# Patient Record
Sex: Female | Born: 1965 | Race: White | Hispanic: No | Marital: Married | Smoking: Never smoker
Health system: Southern US, Community
[De-identification: ages and names within clinical notes are randomized; demographics above are authoritative.]

## PROBLEM LIST (undated history)

## (undated) DIAGNOSIS — E785 Hyperlipidemia, unspecified: Secondary | ICD-10-CM

## (undated) DIAGNOSIS — I272 Pulmonary hypertension, unspecified: Secondary | ICD-10-CM

## (undated) DIAGNOSIS — M1990 Unspecified osteoarthritis, unspecified site: Secondary | ICD-10-CM

## (undated) DIAGNOSIS — I10 Essential (primary) hypertension: Secondary | ICD-10-CM

## (undated) DIAGNOSIS — C801 Malignant (primary) neoplasm, unspecified: Secondary | ICD-10-CM

## (undated) DIAGNOSIS — G709 Myoneural disorder, unspecified: Secondary | ICD-10-CM

## (undated) DIAGNOSIS — D649 Anemia, unspecified: Secondary | ICD-10-CM

## (undated) DIAGNOSIS — E611 Iron deficiency: Secondary | ICD-10-CM

## (undated) DIAGNOSIS — I493 Ventricular premature depolarization: Secondary | ICD-10-CM

## (undated) DIAGNOSIS — C50919 Malignant neoplasm of unspecified site of unspecified female breast: Secondary | ICD-10-CM

## (undated) DIAGNOSIS — E079 Disorder of thyroid, unspecified: Secondary | ICD-10-CM

## (undated) DIAGNOSIS — M8580 Other specified disorders of bone density and structure, unspecified site: Secondary | ICD-10-CM

## (undated) HISTORY — PX: OTHER SURGICAL HISTORY: SHX169

## (undated) HISTORY — PX: MASTECTOMY: SHX3

## (undated) HISTORY — DX: Malignant neoplasm of unspecified site of unspecified female breast: C50.919

## (undated) HISTORY — DX: Hyperlipidemia, unspecified: E78.5

## (undated) HISTORY — DX: Iron deficiency: E61.1

## (undated) HISTORY — DX: Anemia, unspecified: D64.9

## (undated) HISTORY — DX: Ventricular premature depolarization: I49.3

## (undated) HISTORY — DX: Unspecified osteoarthritis, unspecified site: M19.90

## (undated) HISTORY — DX: Essential (primary) hypertension: I10

## (undated) HISTORY — DX: Pulmonary hypertension, unspecified: I27.20

## (undated) HISTORY — DX: Disorder of thyroid, unspecified: E07.9

## (undated) HISTORY — DX: Myoneural disorder, unspecified: G70.9

## (undated) HISTORY — DX: Other specified disorders of bone density and structure, unspecified site: M85.80

## (undated) HISTORY — DX: Malignant (primary) neoplasm, unspecified: C80.1

## (undated) HISTORY — PX: ABDOMINAL HYSTERECTOMY: SHX81

---

## 2012-05-05 DIAGNOSIS — E063 Autoimmune thyroiditis: Secondary | ICD-10-CM | POA: Insufficient documentation

## 2012-05-05 DIAGNOSIS — R1310 Dysphagia, unspecified: Secondary | ICD-10-CM | POA: Insufficient documentation

## 2012-05-05 DIAGNOSIS — I10 Essential (primary) hypertension: Secondary | ICD-10-CM | POA: Insufficient documentation

## 2012-05-05 DIAGNOSIS — E039 Hypothyroidism, unspecified: Secondary | ICD-10-CM | POA: Insufficient documentation

## 2012-06-16 DIAGNOSIS — D219 Benign neoplasm of connective and other soft tissue, unspecified: Secondary | ICD-10-CM | POA: Insufficient documentation

## 2012-07-29 DIAGNOSIS — K2100 Gastro-esophageal reflux disease with esophagitis, without bleeding: Secondary | ICD-10-CM | POA: Insufficient documentation

## 2012-11-22 DIAGNOSIS — T50B95A Adverse effect of other viral vaccines, initial encounter: Secondary | ICD-10-CM | POA: Insufficient documentation

## 2013-10-26 DIAGNOSIS — M797 Fibromyalgia: Secondary | ICD-10-CM | POA: Insufficient documentation

## 2014-02-17 DIAGNOSIS — M545 Low back pain, unspecified: Secondary | ICD-10-CM | POA: Insufficient documentation

## 2014-02-17 DIAGNOSIS — G8929 Other chronic pain: Secondary | ICD-10-CM | POA: Insufficient documentation

## 2014-07-12 DIAGNOSIS — L659 Nonscarring hair loss, unspecified: Secondary | ICD-10-CM | POA: Insufficient documentation

## 2014-12-13 DIAGNOSIS — C50919 Malignant neoplasm of unspecified site of unspecified female breast: Secondary | ICD-10-CM | POA: Insufficient documentation

## 2014-12-13 DIAGNOSIS — Z853 Personal history of malignant neoplasm of breast: Secondary | ICD-10-CM | POA: Insufficient documentation

## 2017-08-07 DIAGNOSIS — R768 Other specified abnormal immunological findings in serum: Secondary | ICD-10-CM | POA: Insufficient documentation

## 2018-02-08 DIAGNOSIS — E785 Hyperlipidemia, unspecified: Secondary | ICD-10-CM | POA: Insufficient documentation

## 2020-10-15 DIAGNOSIS — I10 Essential (primary) hypertension: Secondary | ICD-10-CM | POA: Diagnosis not present

## 2020-10-15 DIAGNOSIS — E039 Hypothyroidism, unspecified: Secondary | ICD-10-CM | POA: Diagnosis not present

## 2020-11-01 DIAGNOSIS — Z20822 Contact with and (suspected) exposure to covid-19: Secondary | ICD-10-CM | POA: Diagnosis not present

## 2020-11-01 DIAGNOSIS — J029 Acute pharyngitis, unspecified: Secondary | ICD-10-CM | POA: Diagnosis not present

## 2020-11-01 DIAGNOSIS — R0981 Nasal congestion: Secondary | ICD-10-CM | POA: Diagnosis not present

## 2020-11-06 DIAGNOSIS — Z20822 Contact with and (suspected) exposure to covid-19: Secondary | ICD-10-CM | POA: Diagnosis not present

## 2020-11-26 DIAGNOSIS — H5213 Myopia, bilateral: Secondary | ICD-10-CM | POA: Diagnosis not present

## 2020-12-05 ENCOUNTER — Telehealth: Payer: Self-pay

## 2020-12-05 ENCOUNTER — Encounter: Payer: Self-pay | Admitting: Internal Medicine

## 2020-12-05 ENCOUNTER — Other Ambulatory Visit: Payer: Self-pay

## 2020-12-05 ENCOUNTER — Ambulatory Visit (INDEPENDENT_AMBULATORY_CARE_PROVIDER_SITE_OTHER): Payer: Medicaid Other | Admitting: Internal Medicine

## 2020-12-05 VITALS — BP 138/89 | HR 60 | Temp 98.3°F | Ht 62.48 in | Wt 180.2 lb

## 2020-12-05 DIAGNOSIS — N939 Abnormal uterine and vaginal bleeding, unspecified: Secondary | ICD-10-CM | POA: Insufficient documentation

## 2020-12-05 DIAGNOSIS — N898 Other specified noninflammatory disorders of vagina: Secondary | ICD-10-CM | POA: Diagnosis not present

## 2020-12-05 DIAGNOSIS — R7303 Prediabetes: Secondary | ICD-10-CM | POA: Diagnosis not present

## 2020-12-05 DIAGNOSIS — R58 Hemorrhage, not elsewhere classified: Secondary | ICD-10-CM | POA: Insufficient documentation

## 2020-12-05 DIAGNOSIS — K219 Gastro-esophageal reflux disease without esophagitis: Secondary | ICD-10-CM | POA: Diagnosis not present

## 2020-12-05 DIAGNOSIS — E039 Hypothyroidism, unspecified: Secondary | ICD-10-CM

## 2020-12-05 DIAGNOSIS — F419 Anxiety disorder, unspecified: Secondary | ICD-10-CM | POA: Insufficient documentation

## 2020-12-05 DIAGNOSIS — C50919 Malignant neoplasm of unspecified site of unspecified female breast: Secondary | ICD-10-CM | POA: Diagnosis not present

## 2020-12-05 DIAGNOSIS — K8020 Calculus of gallbladder without cholecystitis without obstruction: Secondary | ICD-10-CM | POA: Insufficient documentation

## 2020-12-05 LAB — URINALYSIS, ROUTINE W REFLEX MICROSCOPIC
Bilirubin, UA: NEGATIVE
Glucose, UA: NEGATIVE
Ketones, UA: NEGATIVE
Leukocytes,UA: NEGATIVE
Nitrite, UA: NEGATIVE
Protein,UA: NEGATIVE
RBC, UA: NEGATIVE
Specific Gravity, UA: 1.025 (ref 1.005–1.030)
Urobilinogen, Ur: 0.2 mg/dL (ref 0.2–1.0)
pH, UA: 5 (ref 5.0–7.5)

## 2020-12-05 LAB — BAYER DCA HB A1C WAIVED: HB A1C (BAYER DCA - WAIVED): 5.3 % (ref 4.8–5.6)

## 2020-12-05 MED ORDER — FLUCONAZOLE 150 MG PO TABS: 150.0000 mg | ORAL_TABLET | Freq: Once | ORAL | 0 refills | Status: AC

## 2020-12-05 NOTE — Addendum Note (Signed)
Addended by: Charlynne Cousins on: 12/05/2020 01:44 PM   Modules accepted: Orders

## 2020-12-05 NOTE — Progress Notes (Addendum)
BP 138/89   Pulse 60   Temp 98.3 F (36.8 C) (Oral)   Ht 5' 2.48" (1.587 m)   Wt 180 lb 3.2 oz (81.7 kg)   SpO2 99%   BMI 32.45 kg/m    Subjective:    Patient ID: Tonya Odonnell, female    DOB: Sep 17, 1965, 55 y.o.   MRN: [id number]  Chief Complaint  Patient presents with   New Patient (Initial Visit)    Just moved for [place]. Patient states that is has been taking levothyroxine and needs blood testing to adjust dosage, also that she has breast cancer and is also having vaginal bleeding again after 6 years of no menses.     HPI: Tonya Odonnell is a 55 y.o. female  Pt is here to [place] care, moved form [place]. 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 Feels like she has a mass in the l spine area had an [place] there.  Says she was diagnosed with left Breast cancer 2016 - chemo and surgery then , had Mammo twice a year - established here on oct 10th. To see onc @ [place] Dr. Lindi Adie.  Saw onc in [place] last year. Had an abnl paps smear last year and did have a biopsy - no cancer detected then. Says she has bleeding when she put otc vaginal rx for her itching and  noticed blood on her hand. She DOESN'T have a flow like a period and no bleeding enough to soak a pad.  Per pt she has had no menses since 6 yrs. Started with vaginal itch , no sexual activity even with her husband per her x many years per her.    Thyroid Problem Presents for follow-up visit. Patient reports no anxiety, cold intolerance, hoarse voice or leg swelling.   Chief Complaint  Patient presents with   New Patient (Initial Visit)    Just moved for [place]. Patient states that is has been taking levothyroxine and needs blood testing to adjust dosage, also that she has breast cancer and is also having vaginal bleeding again after 6 years of no menses.     Relevant past medical, surgical, family  and social history reviewed and updated as indicated. Interim medical history since our last visit reviewed. Allergies and medications reviewed and updated.  Review of Systems  HENT:  Negative for hoarse voice.   Endocrine: Negative for cold intolerance.  Psychiatric/Behavioral:  The patient is not nervous/anxious.    Per HPI unless specifically indicated above     Objective:    BP 138/89   Pulse 60   Temp 98.3 F (36.8 C) (Oral)   Ht 5' 2.48" (1.587 m)   Wt 180 lb 3.2 oz (81.7 kg)   SpO2 99%   BMI 32.45 kg/m   Wt Readings from Last 3 Encounters:  12/05/20 180 lb 3.2 oz (81.7 kg)    Physical Exam Vitals and nursing note reviewed.  Constitutional:      General: She is not in acute distress.    Appearance: Normal appearance. She is not ill-appearing or diaphoretic.  HENT:     Head: Normocephalic and atraumatic.  Eyes:     Conjunctiva/sclera: Conjunctivae normal.  Cardiovascular:     Rate and Rhythm: Normal rate and regular rhythm.     Heart sounds: No murmur heard. Pulmonary:  Effort: No respiratory distress.     Breath sounds: No stridor. No wheezing, rhonchi or rales.  Chest:     Chest wall: No tenderness.  Abdominal:     General: Abdomen is flat. Bowel sounds are normal. There is no distension.     Palpations: Abdomen is soft. There is no mass.     Tenderness: There is no abdominal tenderness. There is no guarding.  Musculoskeletal:        General: No swelling, tenderness or deformity. Normal range of motion.  Skin:    General: Skin is warm and dry.     Coloration: Skin is not jaundiced.     Findings: No erythema.  Neurological:     General: No focal deficit present.     Mental Status: She is alert. Mental status is at baseline. She is disoriented.     Cranial Nerves: No cranial nerve deficit.     Sensory: No sensory deficit.  Psychiatric:        Mood and Affect: Mood normal.        Behavior: Behavior normal.        Thought Content: Thought content  normal.    No results found for this or any previous visit.      Current Outpatient Medications:    Cholecalciferol 25 MCG (1000 UT) tablet, Take 1 tablet by mouth daily., Disp: , Rfl:    fluconazole (DIFLUCAN) 150 MG tablet, Take 1 tablet (150 mg total) by mouth once for 1 dose., Disp: 1 tablet, Rfl: 0   fluticasone (FLONASE) 50 MCG/ACT nasal spray, Place into the nose., Disp: , Rfl:    ibuprofen (ADVIL) 800 MG tablet, Take by mouth., Disp: , Rfl:    levothyroxine (SYNTHROID) 100 MCG tablet, Take 100 mcg by mouth every morning., Disp: , Rfl:    metoprolol tartrate (LOPRESSOR) 50 MG tablet, Take by mouth., Disp: , Rfl:    neomycin-polymyxin b-dexamethasone (MAXITROL) 3.5-10000-0.1 SUSP, 1 drop 4 (four) times daily., Disp: , Rfl:    omeprazole (PRILOSEC) 40 MG capsule, Take 1 capsule by mouth daily., Disp: , Rfl:    pyridoxine (B-6) 100 MG tablet, Take by mouth., Disp: , Rfl:     Assessment & Plan:   1.HTN is on metoprolol bid for such  Continue current meds.  Medication compliance emphasised. pt advised to keep Bp logs. Pt verbalised understanding of the same. Pt to have a low salt diet . Exercise to reach a goal of at least 150 mins a week.  lifestyle modifications explained and pt understands importance of the above.   2. GERD is on Prilosec for such  patient advised to avoid laying down soon after his meals. He took a 2 hours between dinner and bedtime. Avoid spicy food and triggers that he knows food wise that worsen his acid reflux. Patient verbalized understanding of the above. Lifestyle modifications as above discussed with patient.   3. Vaginal bleeding new onset,  will need to fu with ob gyn asap.  Has an appt near where she lives but is in November  ? 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   4. Prediabetes - was taking her husbands metformin - pt says sh ehas read literature about metformin helping with fibromyalgia which she believes she might have.advcied her that this is not  proven and there hasnt been much in the literature about such. Lifestyle modifications advised to pt. A1c at 5.4   Portion control and avoiding high carb low fat diet advised.  Diet plan given  to pt   exercise plan given and encouraged.  To increase exercise to 150 mins a week ie 21/2 hours a week. Pt verbalises understanding of the above.   5. Breast cancer : in remission per pt.  Oct 10th tpo fu with oncology   6. Vaginal itching : Will start pt on diflucan stop using otc meds. To fu with obgyn.  7. HYPOTHYROIDISM is on 88 mcg of levothyroxine  PLEASE TAKE YOUR THYROID MEDICATION FIRST THING IN THE MORNING WHILST FASTING.  NO MEDICATION/ FOOD FOR AN HOUR AFTER INGESTING THYROID PILLS.   Problem List Items Addressed This Visit       Endocrine   Hypothyroidism, unspecified   Relevant Medications   levothyroxine (SYNTHROID) 100 MCG tablet   metoprolol tartrate (LOPRESSOR) 50 MG tablet   Other Relevant Orders   T4, free     Other   Vaginal bleeding   Bleeding - Primary   Relevant Orders   Ambulatory referral to Obstetrics / Gynecology   [facility] Hb A1c Waived   Vitamin B12   Urinalysis, Routine w reflex microscopic   CBC with Differential/Platelet   Comprehensive metabolic panel   TSH   Other Visit Diagnoses     Prediabetes       Relevant Orders   [facility] Hb A1c Waived   Vitamin B12   Urinalysis, Routine w reflex microscopic   CBC with Differential/Platelet   Comprehensive metabolic panel   TSH        Orders Placed This Encounter  Procedures   [facility] Hb A1c Waived   Vitamin B12   Urinalysis, Routine w reflex microscopic   CBC with Differential/Platelet   Comprehensive metabolic panel   TSH   T4, free   Ambulatory referral to Obstetrics / Gynecology     Meds ordered this encounter  Medications   fluconazole (DIFLUCAN) 150 MG tablet    Sig: Take 1 tablet (150 mg total) by mouth once for 1 dose.    Dispense:  1 tablet    Refill:  0      Follow  up plan: Return in about 4 weeks (around 01/02/2021).  Health Maintenance : Mammogram Paps smear: anl paps smear last year. DEXA: next visit. Cscope : to have this done.

## 2020-12-05 NOTE — Telephone Encounter (Signed)
Copied from [place] [id number]. Topic: Referral - Status >> Dec 05, 2020 12:08 PM Erick Blinks wrote: Reason for CRM: Pt called and reported that her referral needs to be marked as an emergency in order for her to be seen soon.  [phone]   Routing to provider. Dr. Neomia Dear, can the referral be updated to Urgent per patient request?

## 2020-12-06 LAB — COMPREHENSIVE METABOLIC PANEL
ALT: 15 IU/L (ref 0–32)
AST: 20 IU/L (ref 0–40)
Albumin/Globulin Ratio: 2 (ref 1.2–2.2)
Albumin: 4.8 g/dL (ref 3.8–4.9)
Alkaline Phosphatase: 132 IU/L — ABNORMAL HIGH (ref 44–121)
BUN/Creatinine Ratio: 25 — ABNORMAL HIGH (ref 9–23)
BUN: 19 mg/dL (ref 6–24)
Bilirubin Total: 0.8 mg/dL (ref 0.0–1.2)
CO2: 21 mmol/L (ref 20–29)
Calcium: 9.5 mg/dL (ref 8.7–10.2)
Chloride: 103 mmol/L (ref 96–106)
Creatinine, Ser: 0.75 mg/dL (ref 0.57–1.00)
Globulin, Total: 2.4 g/dL (ref 1.5–4.5)
Glucose: 85 mg/dL (ref 70–99)
Potassium: 4.2 mmol/L (ref 3.5–5.2)
Sodium: 141 mmol/L (ref 134–144)
Total Protein: 7.2 g/dL (ref 6.0–8.5)
eGFR: 95 mL/min/{1.73_m2} (ref >59)

## 2020-12-06 LAB — CBC WITH DIFFERENTIAL/PLATELET
Basophils Absolute: 0.1 10*3/uL (ref 0.0–0.2)
Basos: 1 %
EOS (ABSOLUTE): 0.1 10*3/uL (ref 0.0–0.4)
Eos: 2 %
Hematocrit: 49.2 % — ABNORMAL HIGH (ref 34.0–46.6)
Hemoglobin: 15.5 g/dL (ref 11.1–15.9)
Immature Grans (Abs): 0.1 10*3/uL (ref 0.0–0.1)
Immature Granulocytes: 1 %
Lymphocytes Absolute: 2.9 10*3/uL (ref 0.7–3.1)
Lymphs: 39 %
MCH: 28 pg (ref 26.6–33.0)
MCHC: 31.5 g/dL (ref 31.5–35.7)
MCV: 89 fL (ref 79–97)
Monocytes Absolute: 0.5 10*3/uL (ref 0.1–0.9)
Monocytes: 7 %
Neutrophils Absolute: 3.9 10*3/uL (ref 1.4–7.0)
Neutrophils: 50 %
Platelets: 373 10*3/uL (ref 150–450)
RBC: 5.53 x10E6/uL — ABNORMAL HIGH (ref 3.77–5.28)
RDW: 13.5 % (ref 11.7–15.4)
WBC: 7.5 10*3/uL (ref 3.4–10.8)

## 2020-12-06 LAB — T4, FREE: Free T4: 1.56 ng/dL (ref 0.82–1.77)

## 2020-12-06 LAB — TSH: TSH: 1.49 u[IU]/mL (ref 0.450–4.500)

## 2020-12-06 LAB — VITAMIN B12: Vitamin B-12: 507 pg/mL (ref 232–1245)

## 2020-12-10 ENCOUNTER — Telehealth: Payer: Self-pay

## 2020-12-10 NOTE — Telephone Encounter (Signed)
Patient (Tonya Odonnell)  called in stating that she went to the pharmacy to pick up her prescriptions and was only given Fluconazole. Patient states that she needs a refill on all of the medications she provided the names of during her visit. Patient states she is completely out of the medications and needs them sent to [id number], [place] today.

## 2020-12-10 NOTE — Telephone Encounter (Signed)
Can you please see what she needs refills on thnx.

## 2020-12-12 ENCOUNTER — Other Ambulatory Visit: Payer: Self-pay

## 2020-12-13 MED ORDER — OMEPRAZOLE 40 MG PO CPDR: 40.0000 mg | DELAYED_RELEASE_CAPSULE | Freq: Every day | ORAL | 11 refills | Status: DC

## 2020-12-13 MED ORDER — IBUPROFEN 800 MG PO TABS: 800.0000 mg | ORAL_TABLET | Freq: Three times a day (TID) | ORAL | 1 refills | Status: DC | PRN

## 2020-12-13 MED ORDER — LEVOTHYROXINE SODIUM 100 MCG PO TABS: 100.0000 ug | ORAL_TABLET | Freq: Every morning | ORAL | 1 refills | Status: DC

## 2020-12-13 MED ORDER — METOPROLOL TARTRATE 50 MG PO TABS: 50.0000 mg | ORAL_TABLET | Freq: Every day | ORAL | 3 refills | Status: DC

## 2020-12-13 MED ORDER — FLUTICASONE PROPIONATE 50 MCG/ACT NA SUSP: 2.0000 | Freq: Every day | NASAL | 2 refills | Status: DC

## 2020-12-13 MED ORDER — CHOLECALCIFEROL 25 MCG (1000 UT) PO TABS: 1000.0000 [IU] | ORAL_TABLET | Freq: Every day | ORAL | 2 refills | Status: DC

## 2020-12-13 MED ORDER — PYRIDOXINE HCL 100 MG PO TABS: 100.0000 mg | ORAL_TABLET | Freq: Every day | ORAL | 2 refills | Status: DC

## 2020-12-14 ENCOUNTER — Telehealth: Payer: Self-pay

## 2020-12-14 NOTE — Telephone Encounter (Signed)
Patient called stating all her medications were not sent to pharmacy as requested. Please call patient to advise.

## 2020-12-14 NOTE — Telephone Encounter (Signed)
Tried to return call to patient, LVM for patient to return call about prescriptions that may not have been filled.

## 2020-12-15 NOTE — Progress Notes (Signed)
[name]  CONSULT NOTE  Patient Care Team: Charlynne Cousins, MD as PCP - General (Internal Medicine)  CHIEF COMPLAINTS/PURPOSE OF CONSULTATION:  History of breast cancer  HISTORY OF PRESENTING ILLNESS:  Tonya Odonnell 55 y.o. female is here because of recent diagnosis of history of the breast cancer. Diagnostic mammogram in August 2016 showed a hypoechoic mass at 3 o'clock position 3 cm from the nipple. 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 She underwent lumpectomy and sentinel lymph node dissection in October 2016 with Dr. Vickii Chafe which showed grade 2 DCIS and 1 out of 2 lymph nodes positive for metastatic carcinoma, ER+/PR+/Her2 equivocal (2-3+)/FISH-. MRI breast in 01/02/215 showed abnormal enhancing area in the left breast. 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 She underwent dose-dense chemotherapy with Adriamycin and Cytoxan, followed by Taxol. She was started on tamoxifen in May 2017, and she was later changed to Anastrozole due to joint and muscle pain. She presents to the clinic today for initial evaluation and discussion of treatment options.   she moved from [place] to [place] to retire along with her husband and her son.  She has not been adjusting well to [facility].  Her clinical complaints are related to diffuse body aches and pains.  She tells me that she hurts from head to toe. Her anastrozole got completed in June or July and she has not taken any of it.  In spite of that her joint stiffness and achiness has not improved.  I reviewed her records extensively and collaborated the history with the patient.  SUMMARY OF ONCOLOGIC HISTORY: Oncology History  Malignant neoplasm of female breast ([place])  08/2014 Initial Diagnosis   After many years of complains of swelling in the left side of the body and tenderness in her breast, she was  diagnosed with grade 2 IDC 2.1 cm left breast 3 o'clock position   12/2014 Surgery   Left lumpectomy: Grade 2 multifocal invasive ductal carcinoma 1.6 cm, 0.3 cm, DCIS low-grade, lymphovascular invasion present, 1/2 lymph nodes positive ER positive greater than 95%, PR positive greater than 95%, HER2 equivocal 2+ by IHC and FISH was negative (Dr.Lo)   01/17/2015 Surgery   Left mastectomy (done because of close margins): Residual LCIS, 1/5 lymph nodes positive (total 2/10 lymph nodes positive)   01/17/2015 Oncotype testing   Oncotype DX score: 20   07/28/2015 -  Anti-estrogen oral therapy   Patient was on and off antiestrogen therapy since May 2017.  Unable to tolerate tamoxifen or Zoladex.  Eventually after she became menopausal, started on Arimidex January 2021 (takes Cymbalta for hot flashes)     MEDICAL HISTORY:  Past Medical History:  Diagnosis Date   Breast cancer in female ([place])    Cancer ([place])    Hypertension    Thyroid disease     SURGICAL HISTORY: Past Surgical History:  Procedure Laterality Date   ABDOMINAL HYSTERECTOMY      SOCIAL HISTORY: Social History   Socioeconomic History   Marital status: Married    Spouse name: Not on file   Number of children: Not on file   Years of education: Not on file   Highest education level: Not on file  Occupational History   Not on file  Tobacco Use   Smoking status: Never   Smokeless tobacco: Never  Substance and Sexual Activity   Alcohol use: Never   Drug use:  Never   Sexual activity: Not Currently  Other Topics Concern   Not on file  Social History Narrative   Not on file   Social Determinants of Health   Financial Resource Strain: Not on file  Food Insecurity: Not on file  Transportation Needs: Not on file  Physical Activity: Not on file  Stress: Not on file  Social Connections: Not on file  Intimate Partner Violence: Not on file    FAMILY HISTORY: Family History  Problem Relation Age of Onset    Hypertension Mother     ALLERGIES:  is allergic to lisinopril.  MEDICATIONS:  Current Outpatient Medications  Medication Sig Dispense Refill   Cholecalciferol 25 MCG (1000 UT) tablet Take 1 tablet (1,000 Units total) by mouth daily. 30 tablet 2   fluticasone (FLONASE) 50 MCG/ACT nasal spray Place 2 sprays into both nostrils daily. 16 g 2   ibuprofen (ADVIL) 800 MG tablet Take 1 tablet (800 mg total) by mouth every 8 (eight) hours as needed. 30 tablet 1   levothyroxine (SYNTHROID) 100 MCG tablet Take 1 tablet (100 mcg total) by mouth every morning. 90 tablet 1   metoprolol tartrate (LOPRESSOR) 50 MG tablet Take 1 tablet (50 mg total) by mouth daily. Take by mouth. 30 tablet 3   neomycin-polymyxin b-dexamethasone (MAXITROL) 3.5-10000-0.1 SUSP 1 drop 4 (four) times daily.     omeprazole (PRILOSEC) 40 MG capsule Take 1 capsule (40 mg total) by mouth daily. 30 capsule 11   pyridoxine (B-6) 100 MG tablet Take 1 tablet (100 mg total) by mouth daily. 30 tablet 2   No current facility-administered medications for this visit.    REVIEW OF SYSTEMS:   Constitutional: Diffuse body aches and pains  PHYSICAL EXAMINATION: ECOG PERFORMANCE STATUS: 2 - Symptomatic, <50% confined to bed  There were no vitals filed for this visit. There were no vitals filed for this visit.   BREAST: No palpable nodules in breast. No palpable axillary or supraclavicular lymphadenopathy (exam performed in the presence of a chaperone)   LABORATORY DATA:  I have reviewed the data as listed Lab Results  Component Value Date   WBC 7.5 12/05/2020   HGB 15.5 12/05/2020   HCT 49.2 (H) 12/05/2020   MCV 89 12/05/2020   PLT 373 12/05/2020   Lab Results  Component Value Date   NA 141 12/05/2020   K 4.2 12/05/2020   CL 103 12/05/2020   CO2 21 12/05/2020    RADIOGRAPHIC STUDIES: I have personally reviewed the radiological reports and agreed with the findings in the report.  ASSESSMENT AND PLAN:  Malignant neoplasm  of female breast ([place]) Left lumpectomy: Grade 2 multifocal invasive ductal carcinoma 1.6 cm, 0.3 cm, DCIS low-grade, lymphovascular invasion present, 1/2 lymph nodes positive ER positive greater than 95%, PR positive greater than 95%, HER2 equivocal 2+ by IHC and FISH was negative (Dr.Lo) Treatment was provided at [facility] [place]) Left mastectomy (done because of close margins): Residual LCIS, 1/5 lymph nodes positive (total 2/10 lymph nodes positive) Oncotype score 20  Current treatment: Patient was unable to tolerate tamoxifen as well as ovarian function suppression.  She went on anastrozole starting January 2021  Breast cancer surveillance: Mammograms to be done in May 2023 Breast exam 12/17/2020: Benign  Vitamin D deficiency: We will order vitamin D level today.  Return to clinic in 3 months for survivorship visit.  I will see her back in 6 months and after that we can see her once a  year.  Patient tells me that she gets labs very often.  I discussed with her that labs do not show any breast cancer evidence.   All questions were answered. The patient knows to call the clinic with any problems, questions or concerns.   Rulon Eisenmenger, MD, MPH 12/17/2020    I, Thana Ates, am acting as scribe for Nicholas Lose, MD.  I have reviewed the above documentation for accuracy and completeness, and I agree with the above.

## 2020-12-17 ENCOUNTER — Other Ambulatory Visit: Payer: Self-pay

## 2020-12-17 ENCOUNTER — Inpatient Hospital Stay: Payer: Medicaid Other

## 2020-12-17 ENCOUNTER — Inpatient Hospital Stay: Payer: Medicaid Other | Attending: Hematology and Oncology | Admitting: Hematology and Oncology

## 2020-12-17 DIAGNOSIS — Z79811 Long term (current) use of aromatase inhibitors: Secondary | ICD-10-CM | POA: Diagnosis not present

## 2020-12-17 DIAGNOSIS — C50812 Malignant neoplasm of overlapping sites of left female breast: Secondary | ICD-10-CM | POA: Diagnosis not present

## 2020-12-17 DIAGNOSIS — Z79899 Other long term (current) drug therapy: Secondary | ICD-10-CM | POA: Diagnosis not present

## 2020-12-17 DIAGNOSIS — Z170 Estrogen receptor positive status [ER+]: Secondary | ICD-10-CM

## 2020-12-17 DIAGNOSIS — Z9012 Acquired absence of left breast and nipple: Secondary | ICD-10-CM | POA: Diagnosis not present

## 2020-12-17 DIAGNOSIS — E559 Vitamin D deficiency, unspecified: Secondary | ICD-10-CM | POA: Diagnosis not present

## 2020-12-17 DIAGNOSIS — C50412 Malignant neoplasm of upper-outer quadrant of left female breast: Secondary | ICD-10-CM

## 2020-12-17 DIAGNOSIS — I10 Essential (primary) hypertension: Secondary | ICD-10-CM | POA: Diagnosis not present

## 2020-12-17 DIAGNOSIS — E079 Disorder of thyroid, unspecified: Secondary | ICD-10-CM | POA: Diagnosis not present

## 2020-12-17 DIAGNOSIS — Z7952 Long term (current) use of systemic steroids: Secondary | ICD-10-CM | POA: Insufficient documentation

## 2020-12-17 LAB — VITAMIN D 25 HYDROXY (VIT D DEFICIENCY, FRACTURES): Vit D, 25-Hydroxy: 31.95 ng/mL (ref 30–100)

## 2020-12-17 MED ORDER — ANASTROZOLE 1 MG PO TABS: 1.0000 mg | ORAL_TABLET | Freq: Every day | ORAL | 3 refills | Status: DC

## 2020-12-17 NOTE — Assessment & Plan Note (Signed)
Left lumpectomy: Grade 2 multifocal invasive ductal carcinoma 1.6 cm, 0.3 cm, DCIS low-grade, lymphovascular invasion present, 1/2 lymph nodes positive ER positive greater than 95%, PR positive greater than 95%, HER2 equivocal 2+ by IHC and FISH was negative (Dr.Lo) Treatment was provided at [facility] [place]) Left mastectomy (done because of close margins): Residual LCIS, 1/5 lymph nodes positive (total 2/10 lymph nodes positive) Oncotype score 20  Current treatment: Patient was unable to tolerate tamoxifen as well as ovarian function suppression.  She went on anastrozole starting January 2021  Breast cancer surveillance:   Return to clinic in 1 year for follow-up

## 2020-12-18 ENCOUNTER — Encounter: Payer: Self-pay | Admitting: Hematology and Oncology

## 2020-12-18 ENCOUNTER — Encounter: Payer: Self-pay | Admitting: Obstetrics and Gynecology

## 2020-12-18 ENCOUNTER — Other Ambulatory Visit: Payer: Self-pay | Admitting: *Deleted

## 2020-12-18 ENCOUNTER — Ambulatory Visit (INDEPENDENT_AMBULATORY_CARE_PROVIDER_SITE_OTHER): Payer: Medicaid Other | Admitting: Obstetrics and Gynecology

## 2020-12-18 VITALS — BP 135/119 | HR 84 | Resp 16 | Ht 63.0 in | Wt 182.9 lb

## 2020-12-18 DIAGNOSIS — B3731 Acute candidiasis of vulva and vagina: Secondary | ICD-10-CM | POA: Diagnosis not present

## 2020-12-18 DIAGNOSIS — Z853 Personal history of malignant neoplasm of breast: Secondary | ICD-10-CM

## 2020-12-18 DIAGNOSIS — C50412 Malignant neoplasm of upper-outer quadrant of left female breast: Secondary | ICD-10-CM

## 2020-12-18 DIAGNOSIS — Z170 Estrogen receptor positive status [ER+]: Secondary | ICD-10-CM

## 2020-12-18 DIAGNOSIS — N950 Postmenopausal bleeding: Secondary | ICD-10-CM | POA: Diagnosis not present

## 2020-12-18 DIAGNOSIS — N952 Postmenopausal atrophic vaginitis: Secondary | ICD-10-CM | POA: Diagnosis not present

## 2020-12-18 MED ORDER — FLUCONAZOLE 150 MG PO TABS: 150.0000 mg | ORAL_TABLET | ORAL | 0 refills | Status: DC

## 2020-12-18 NOTE — Progress Notes (Signed)
HPI:      [name] is a 55 y.o. No obstetric history on file. who LMP was No LMP recorded.  Subjective:   She presents today with her main concern being postmenopausal bleeding.  Patient has a history of breast cancer and has been on anastrozole but has not been taking it recently.  She states that she is in menopause.  She developed what she thought was a yeast infection was given Diflucan 1 pill.  This did not resolve her yeast infection so she began using over-the-counter Monistat with a vaginal applicator.  The 3 times that she use the applicator intravaginally she noticed some red blood on the applicator after use.  This concerned her.  She has noticed no vaginal bleeding other than when using the applicator. Additionally the patient describes a history of receiving H1 N1 vaccination and having multiple systemic and neurologic complications including left-sided body pain arm pain and vaginal pain.  She also states that she underwent a biopsy in the vagina and that she continues to experience pain from this biopsy more than a year later.  She says that no one can tell her why this happened. Reviewing records that she has on her iPad I noticed that she had an endometrial biopsy approximately 1 year ago with no hyperplasia or malignancy present.  She also had a Pap smear approximately a year and a half ago with no evidence of atypical cells or HPV.    Hx: The following portions of the patient's history were reviewed and updated as appropriate:             She  has a past medical history of Breast cancer in female [facility]), Cancer ([place]), Hypertension, and Thyroid disease. She does not have any pertinent problems on file. She  has a past surgical history that includes Abdominal hysterectomy. Her family history includes Hypertension in her mother. She  reports that she has never smoked. She has never used smokeless tobacco. She reports that she does not drink alcohol and does not use drugs. She  has a current medication list which includes the following prescription(s): fluconazole, anastrozole, cholecalciferol, fluticasone, ibuprofen, levothyroxine, metoprolol tartrate, neomycin-polymyxin b-dexamethasone, omeprazole, and pyridoxine. She is allergic to lisinopril.       Review of Systems:  Review of Systems  Constitutional: Denied constitutional symptoms, night sweats, recent illness, fatigue, fever, insomnia and weight loss.  Eyes: Denied eye symptoms, eye pain, photophobia, vision change and visual disturbance.  Ears/Nose/Throat/Neck: Denied ear, nose, throat or neck symptoms, hearing loss, nasal discharge, sinus congestion and sore throat.  Cardiovascular: Denied cardiovascular symptoms, arrhythmia, chest pain/pressure, edema, exercise intolerance, orthopnea and palpitations.  Respiratory: Denied pulmonary symptoms, asthma, pleuritic pain, productive sputum, cough, dyspnea and wheezing.  Gastrointestinal: Denied, gastro-esophageal reflux, melena, nausea and vomiting.  Genitourinary: See HPI for additional information.  Musculoskeletal: Denied musculoskeletal symptoms, stiffness, swelling, muscle weakness and myalgia.  Dermatologic: Denied dermatology symptoms, rash and scar.  Neurologic: Denied neurology symptoms, dizziness, headache, neck pain and syncope.  Psychiatric: Denied psychiatric symptoms, anxiety and depression.  Endocrine: Denied endocrine symptoms including hot flashes and night sweats.   Meds:   Current Outpatient Medications on File Prior to Visit  Medication Sig Dispense Refill   anastrozole (ARIMIDEX) 1 MG tablet Take 1 tablet (1 mg total) by mouth daily. 90 tablet 3   Cholecalciferol 25 MCG (1000 UT) tablet Take 1 tablet (1,000 Units total) by mouth daily. 30 tablet 2   fluticasone (FLONASE) 50 MCG/ACT nasal spray  Place 2 sprays into both nostrils daily. 16 g 2   ibuprofen (ADVIL) 800 MG tablet Take 1 tablet (800 mg total) by mouth every 8 (eight) hours as  needed. 30 tablet 1   levothyroxine (SYNTHROID) 100 MCG tablet Take 1 tablet (100 mcg total) by mouth every morning. 90 tablet 1   metoprolol tartrate (LOPRESSOR) 50 MG tablet Take 1 tablet (50 mg total) by mouth daily. Take by mouth. 30 tablet 3   neomycin-polymyxin b-dexamethasone (MAXITROL) 3.5-10000-0.1 SUSP 1 drop 4 (four) times daily.     omeprazole (PRILOSEC) 40 MG capsule Take 1 capsule (40 mg total) by mouth daily. 30 capsule 11   pyridoxine (B-6) 100 MG tablet Take 1 tablet (100 mg total) by mouth daily. 30 tablet 2   No current facility-administered medications on file prior to visit.      Objective:     Vitals:   12/18/20 1127  BP: (!) 135/119  Pulse: 84  Resp: 16   Filed Weights   12/18/20 1127  Weight: 182 lb 14.4 oz (83 kg)              Physical examination   Pelvic:   Vulva: Normal appearance.  No lesions.  Significant atrophy at the introitus-possible early lichen sclerosis.  Vagina: No lesions or abnormalities noted.  Atrophic  Support: Normal pelvic support.  Urethra No masses tenderness or scarring.  Meatus Normal size without lesions or prolapse.  Cervix: Normal appearance.  No lesions.  Anus: Normal exam.  No lesions.  Perineum: Normal exam.  No lesions.        Bimanual   Uterus: Normal size.  Non-tender.  Mobile.  AV.  Adnexae: No masses.  Non-tender to palpation.  Cul-de-sac: Negative for abnormality.             Assessment:    No obstetric history on file. Patient Active Problem List   Diagnosis Date Noted   Anxiety 12/05/2020   Gallstones 12/05/2020   Vaginal bleeding 12/05/2020   Bleeding 12/05/2020   Vaginal itching 12/05/2020   Gastroesophageal reflux disease without esophagitis 12/05/2020   Prediabetes 12/05/2020   Hyperlipidemia 02/08/2018   Hepatitis B core antibody positive 08/07/2017   Malignant neoplasm of female breast ([place]) 12/13/2014   Hair thinning 07/12/2014   Chronic low back pain 02/17/2014   Fibromyalgia syndrome  10/26/2013   Adverse reaction to influenza vaccine 11/22/2012   Gastroesophageal reflux disease with esophagitis 07/29/2012   Fibroid 06/16/2012   Dysphagia 05/05/2012   Essential (primary) hypertension 05/05/2012   Hypothyroidism, unspecified 05/05/2012     1. Monilial vulvovaginitis   2. Atrophic vaginitis   3. History of breast cancer   4. Postmenopausal bleeding     Her bleeding is likely from applicator use with atrophic vaginitis causing bleeding of the vaginal wall.  With a recent negative endometrial biopsy and negative Pap smear this is the most likely issue.   Plan:            1.  I have tried to reassure the patient regarding her vaginal bleeding and I believe it w will resolve without treatment as long as she does not use intravaginal applicators.  She has been instructed that if she has further vaginal bleeding without the applicator she is to inform us.  Possible future use of vaginal estrogen although the patient's high concern for cancer may make it difficult to convince her this is not a systemic medication and will not affect her breast cancer.  2.  Diflucan for 3 doses as patient believes she still may have a yeast infection.  3.  If itching does not resolve with Diflucan use consider short course of clobetasol to the introitus for itching.  4.  Recommend follow-up for annual examination and Pap smear. Orders No orders of the defined types were placed in this encounter.    Meds ordered this encounter  Medications   fluconazole (DIFLUCAN) 150 MG tablet    Sig: Take 1 tablet (150 mg total) by mouth every 3 (three) days. For three doses    Dispense:  3 tablet    Refill:  0      F/U  Return for Annual Physical, Pt to contact us if symptoms worsen. I spent 32 minutes involved in the care of this patient preparing to see the patient by obtaining and reviewing her medical history (including labs, imaging tests and prior procedures), documenting clinical information in  the electronic health record (EHR), counseling and coordinating care plans, writing and sending prescriptions, ordering tests or procedures and in direct communicating with the patient and medical staff discussing pertinent items from her history and physical exam.  Finis Bud, M.D. 12/18/2020 12:18 PM

## 2020-12-19 ENCOUNTER — Ambulatory Visit: Payer: Self-pay

## 2020-12-19 NOTE — Telephone Encounter (Signed)
Says she needs to speak with a nurse regarding a medication problem that has lasted for two weeks. Did not disclose any further information when asked  Best contact: [phone]     Pt. Reports she takes metoprolol 50 mg twice a day. Medication list has once a day. Reports this is wrong. Requests a 90 day supply for metoprolol and pyridoxine be sent to [facility] [place]. Please advise.

## 2020-12-20 ENCOUNTER — Other Ambulatory Visit: Payer: Self-pay | Admitting: *Deleted

## 2020-12-20 ENCOUNTER — Other Ambulatory Visit: Payer: Self-pay | Admitting: Internal Medicine

## 2020-12-20 ENCOUNTER — Other Ambulatory Visit: Payer: Self-pay | Admitting: Hematology and Oncology

## 2020-12-20 DIAGNOSIS — C50412 Malignant neoplasm of upper-outer quadrant of left female breast: Secondary | ICD-10-CM

## 2020-12-20 DIAGNOSIS — Z170 Estrogen receptor positive status [ER+]: Secondary | ICD-10-CM

## 2020-12-20 MED ORDER — PYRIDOXINE HCL 100 MG PO TABS: 100.0000 mg | ORAL_TABLET | Freq: Every day | ORAL | 2 refills | Status: AC

## 2020-12-20 MED ORDER — METOPROLOL TARTRATE 50 MG PO TABS: 50.0000 mg | ORAL_TABLET | Freq: Two times a day (BID) | ORAL | 4 refills | Status: DC

## 2020-12-20 NOTE — Telephone Encounter (Signed)
Patient states she is requesting a 90-day supply as her insurance says she has a co-pay when her prescription is sent over for only a 30-day supply. Patient states she has pay every time for the 30 day supply. Patient states she cannot afford paying for the B-6 over the counter. Please advise?

## 2020-12-20 NOTE — Addendum Note (Signed)
Addended by: Irena Reichmann on: 12/20/2020 11:15 AM   Modules accepted: Orders

## 2020-12-20 NOTE — Telephone Encounter (Signed)
90-day supply sent over to patient's local pharmacy.

## 2020-12-20 NOTE — Telephone Encounter (Signed)
Ok to refill them thnx.

## 2020-12-20 NOTE — Progress Notes (Signed)
Received call from [facility] with [facility] stating that screening MRI breast is fully covered by insurance and requesting order be placed. Orders placed per MD request.

## 2020-12-20 NOTE — Progress Notes (Signed)
Received call from [facility] stating a CPT code can not be applied to screening breast MRI which leads to the cost of $400 and can not be processed through insurance due to no CPT code.  [facility] states that a regular Breast MRI W and WO contrast can be processed and paid through pt insurance. Scheduler with [facility] states they have attempt x3 to explained to pt and pt continues to hang up on them. [facility] also states pt notified to contact radiologist in [place] and request previous mammograms and breast MRI's be sent to their office but pt has refused at this time.  RN placed conference call with [facility] and pt to explain the difference in MRIs.  Pt became very upset and refuses regular Breast MRI as well as refuses to pay $400 fee for screening Breast MRI and states she does not want to deal with this and hung up the phone. RN notified MD of situation.  Per MD order to be placed for regular Breast MRI W and WO contrast and pt can call [facility] when she is ready to undergo MRI.

## 2021-01-02 ENCOUNTER — Other Ambulatory Visit: Payer: Self-pay

## 2021-01-02 ENCOUNTER — Ambulatory Visit (INDEPENDENT_AMBULATORY_CARE_PROVIDER_SITE_OTHER): Payer: Medicaid Other | Admitting: Internal Medicine

## 2021-01-02 ENCOUNTER — Encounter: Payer: Self-pay | Admitting: Internal Medicine

## 2021-01-02 VITALS — BP 140/90 | HR 102 | Temp 98.5°F | Ht 62.99 in | Wt 182.8 lb

## 2021-01-02 DIAGNOSIS — I10 Essential (primary) hypertension: Secondary | ICD-10-CM | POA: Diagnosis not present

## 2021-01-02 DIAGNOSIS — E039 Hypothyroidism, unspecified: Secondary | ICD-10-CM

## 2021-01-02 DIAGNOSIS — R52 Pain, unspecified: Secondary | ICD-10-CM

## 2021-01-02 DIAGNOSIS — B079 Viral wart, unspecified: Secondary | ICD-10-CM | POA: Insufficient documentation

## 2021-01-02 DIAGNOSIS — C50919 Malignant neoplasm of unspecified site of unspecified female breast: Secondary | ICD-10-CM

## 2021-01-02 MED ORDER — METOPROLOL TARTRATE 50 MG PO TABS: 50.0000 mg | ORAL_TABLET | Freq: Two times a day (BID) | ORAL | 2 refills | Status: DC

## 2021-01-02 NOTE — Progress Notes (Signed)
BP 140/90   Pulse (!) 102   Temp 98.5 F (36.9 C) (Oral)   Ht 5' 2.99" (1.6 m)   Wt 182 lb 12.8 oz (82.9 kg)   LMP  (LMP Unknown)   SpO2 95%   BMI 32.39 kg/m    Subjective:    Patient ID: Tonya Odonnell, female    DOB: March 26, 1965, 55 y.o.   MRN: [id number]  Chief Complaint  Patient presents with   Prediabetes   Gastroesophageal Reflux   Hypothyroidism   Hyperlipidemia   Hypertension   Anxiety   Fibromyalgia    HPI: Tonya Odonnell is a 55 y.o. female  Pt has pain " everywhere in my muscles and bones " per her verbal record pain  started after chemo. She would like to see another  heme/ onc specialist as she didn't feel her follow ups were as they were in [place] where she is from.  She c/o a headache and her bP has been in the 277'O systolic. Doesn't want to add another medication at this poiint. Bp stable at the office today.   Gastroesophageal Reflux She reports no chest pain. Nothing aggravates the symptoms.  Hyperlipidemia This is a chronic problem. The problem is controlled. Pertinent negatives include no chest pain.  Hypertension This is a chronic problem. The problem has been waxing and waning since onset. The problem is controlled. Associated symptoms include anxiety. Pertinent negatives include no chest pain, malaise/fatigue or neck pain.  Anxiety Patient reports no chest pain.     Chief Complaint  Patient presents with   Prediabetes   Gastroesophageal Reflux   Hypothyroidism   Hyperlipidemia   Hypertension   Anxiety   Fibromyalgia    Relevant past medical, surgical, family and social history reviewed and updated as indicated. Interim medical history since our last visit reviewed. Allergies and medications reviewed and updated.  Review of Systems  Constitutional:  Negative for malaise/fatigue.  Cardiovascular:  Negative for chest pain.  Musculoskeletal:  Negative for neck pain.   Per HPI unless specifically indicated above     Objective:     BP 140/90   Pulse (!) 102   Temp 98.5 F (36.9 C) (Oral)   Ht 5' 2.99" (1.6 m)   Wt 182 lb 12.8 oz (82.9 kg)   LMP  (LMP Unknown)   SpO2 95%   BMI 32.39 kg/m   Wt Readings from Last 3 Encounters:  01/02/21 182 lb 12.8 oz (82.9 kg)  12/18/20 182 lb 14.4 oz (83 kg)  12/17/20 182 lb 1.6 oz (82.6 kg)    Physical Exam Vitals and nursing note reviewed.  Constitutional:      General: She is not in acute distress.    Appearance: Normal appearance. She is not ill-appearing or diaphoretic.  HENT:     Head: Normocephalic and atraumatic.     Right Ear: Tympanic membrane and external ear normal. There is no impacted cerumen.     Left Ear: External ear normal.     Nose: No congestion or rhinorrhea.     Mouth/Throat:     Pharynx: No oropharyngeal exudate or posterior oropharyngeal erythema.  Eyes:     Conjunctiva/sclera: Conjunctivae normal.     Pupils: Pupils are equal, round, and reactive to light.  Cardiovascular:     Rate and Rhythm: Normal rate and regular rhythm.     Heart sounds: No murmur heard.   No friction rub. No gallop.  Pulmonary:     Effort: No respiratory distress.  Breath sounds: No stridor. No wheezing or rhonchi.  Chest:     Chest wall: No tenderness.  Abdominal:     General: Abdomen is flat. Bowel sounds are normal. There is no distension.     Palpations: Abdomen is soft. There is no mass.     Tenderness: There is no abdominal tenderness. There is no guarding.  Musculoskeletal:        General: No swelling or deformity.     Cervical back: Normal range of motion and neck supple. No rigidity or tenderness.     Right lower leg: No edema.     Left lower leg: No edema.  Skin:    General: Skin is warm and dry.     Coloration: Skin is not jaundiced.     Findings: No erythema.  Neurological:     Mental Status: She is alert and oriented to person, place, and time. Mental status is at baseline.  Psychiatric:     Comments: irritable    Results for orders placed  or performed in visit on 12/17/20  Vitamin D 25 hydroxy  Result Value Ref Range   Vit D, 25-Hydroxy 31.95 30 - 100 ng/mL        Current Outpatient Medications:    anastrozole (ARIMIDEX) 1 MG tablet, Take 1 tablet (1 mg total) by mouth daily., Disp: 90 tablet, Rfl: 3   Cholecalciferol 25 MCG (1000 UT) tablet, Take 1 tablet (1,000 Units total) by mouth daily., Disp: 30 tablet, Rfl: 2   fluconazole (DIFLUCAN) 150 MG tablet, Take 1 tablet (150 mg total) by mouth every 3 (three) days. For three doses, Disp: 3 tablet, Rfl: 0   fluticasone (FLONASE) 50 MCG/ACT nasal spray, Place 2 sprays into both nostrils daily., Disp: 16 g, Rfl: 2   ibuprofen (ADVIL) 800 MG tablet, Take 1 tablet (800 mg total) by mouth every 8 (eight) hours as needed., Disp: 30 tablet, Rfl: 1   levothyroxine (SYNTHROID) 100 MCG tablet, Take 1 tablet (100 mcg total) by mouth every morning., Disp: 90 tablet, Rfl: 1   metoprolol tartrate (LOPRESSOR) 50 MG tablet, Take 1 tablet (50 mg total) by mouth 2 (two) times daily. Take by mouth., Disp: 60 tablet, Rfl: 4   neomycin-polymyxin b-dexamethasone (MAXITROL) 3.5-10000-0.1 SUSP, 1 drop 4 (four) times daily., Disp: , Rfl:    omeprazole (PRILOSEC) 40 MG capsule, Take 1 capsule (40 mg total) by mouth daily., Disp: 30 capsule, Rfl: 11   pyridoxine (B-6) 100 MG tablet, Take 1 tablet (100 mg total) by mouth daily., Disp: 90 tablet, Rfl: 2    Assessment & Plan:  1.hypothyroidism is on synthroid for such  PLEASE TAKE YOUR THYROID MEDICATION FIRST THING IN THE MORNING WHILST FASTING.  NO MEDICATION/ FOOD FOR AN HOUR AFTER INGESTING THYROID PILLS.  2. Htn : is on metoprolol 50 mg bid.  Continue current meds.  Medication compliance emphasised. pt advised to keep Bp logs. Pt verbalised understanding of the same. Pt to have a low salt diet . Exercise to reach a goal of at least 150 mins a week.  lifestyle modifications explained and pt understands importance of the above.   3. Breast cancer is  seeing oncology for such  Chronic, stable fu and mx per onc ,  Pt requests a new referral, will do so, Pt didn't seem to like the change in her medical care and says recurrently that her care was " different  " in [place] and isnt happy with the changes in her situation.  She seemed frustrated and would like new referrals for her oncologist as well.   4. Chronic pain : " generalised, unsure If sec to her " thinking she got it fromt he swine fu vaccine "  ? Fibromyalgia which was told to her by a physican @ [place] Will refer to pain mx for further options for her care  Problem List Items Addressed This Visit   None    No orders of the defined types were placed in this encounter.    No orders of the defined types were placed in this encounter.    Follow up plan: No follow-ups on file.

## 2021-01-03 ENCOUNTER — Telehealth: Payer: Self-pay | Admitting: Internal Medicine

## 2021-01-03 NOTE — Telephone Encounter (Signed)
Tonya Odonnell,  Are you able to assist patient please?  Copied from [place] [id number]. Topic: General - Other >> Jan 03, 2021  1:46 PM Leward Quan A wrote: Reason for CRM: Clarise Cruz with [facility] called in to inform Dr Neomia Dear That they received the referral but are out of network with patients insurance so cannot accept the referral. Please advise

## 2021-01-07 ENCOUNTER — Telehealth: Payer: Self-pay | Admitting: Internal Medicine

## 2021-01-07 NOTE — Telephone Encounter (Signed)
Copied from [place] [id number]. Topic: Referral - Status >> Jan 07, 2021  4:00 PM Yvette Rack wrote: Reason for CRM: Pt stated she had a message from [facility] but the phone number that was left is incorrect. Pt requests contact information for location she was referred to.

## 2021-01-08 NOTE — Telephone Encounter (Signed)
Sent pt the contact information through mychart.

## 2021-01-09 ENCOUNTER — Encounter: Payer: Self-pay | Admitting: Family Medicine

## 2021-01-09 ENCOUNTER — Other Ambulatory Visit: Payer: Self-pay

## 2021-01-09 ENCOUNTER — Other Ambulatory Visit (HOSPITAL_COMMUNITY)
Admission: RE | Admit: 2021-01-09 | Discharge: 2021-01-09 | Disposition: A | Discharge status: Home / Self Care | Payer: Medicaid Other | Source: Ambulatory Visit | Attending: Family Medicine | Admitting: Family Medicine

## 2021-01-09 ENCOUNTER — Ambulatory Visit (INDEPENDENT_AMBULATORY_CARE_PROVIDER_SITE_OTHER): Payer: Medicaid Other | Admitting: Family Medicine

## 2021-01-09 VITALS — BP 148/91 | HR 62 | Ht 63.0 in | Wt 184.0 lb

## 2021-01-09 DIAGNOSIS — B001 Herpesviral vesicular dermatitis: Secondary | ICD-10-CM

## 2021-01-09 DIAGNOSIS — N898 Other specified noninflammatory disorders of vagina: Secondary | ICD-10-CM | POA: Insufficient documentation

## 2021-01-09 DIAGNOSIS — N952 Postmenopausal atrophic vaginitis: Secondary | ICD-10-CM

## 2021-01-09 MED ORDER — CLOBETASOL PROPIONATE 0.05 % EX CREA: 1.0000 "application " | TOPICAL_CREAM | Freq: Two times a day (BID) | CUTANEOUS | 2 refills | Status: DC

## 2021-01-09 MED ORDER — FLUCONAZOLE 150 MG PO TABS: 150.0000 mg | ORAL_TABLET | Freq: Once | ORAL | 3 refills | Status: AC

## 2021-01-09 MED ORDER — VALACYCLOVIR HCL 1 G PO TABS: 1000.0000 mg | ORAL_TABLET | Freq: Every day | ORAL | 2 refills | Status: DC

## 2021-01-09 NOTE — Progress Notes (Signed)
NGYN patient presents for problem visit today pt recently moved from [place] in June of this year. Pt complains of itching once a week , or after using the bathroom notes recurrent yeast infections.  Pt last treatment she used vaginal cream she noticed blood. Pt denies any continual vaginal bleeding.  *Pt states w/ her having Breast Cancer she is very concerned.

## 2021-01-09 NOTE — Patient Instructions (Signed)
Natural Remedies for vaginal symptoms  Option #1 1 Tbsp Fractitionated Coconut Oil 10 drops of Melaleuca (Tea Tree) Oil  Mix ingredients together well.  Soak 3-4 tampons (in applicators) in that mixture until all or mostly all mixture is soaked up into the tampons.  Insert 1 saturated tampon vaginally and wear overnight for 3-4 nights.    Option #2 (sometimes to be used in conjunction with option #1) Fill tub with enough to cover lap/lower abdomen warm water.  Mix 1/2 cup of baking soda in water.  Soak in water/baking soda mixture for at least 20 minutes.  Be sure to swish water in between legs to get as much in vagina as possible.  This soak should be done after sexual intercourse and menstrual cycles.     Option #3 (sometimes to be used in conjunction with option #1 and 2) Fill tub with enough to cover lap/lower abdomen warm water.  Mix 2-4 cup of apple cider vinegar in water.  Soak in water/vinegar mixture for at least 20 minutes.  Be sure to swish water in between legs to get as much in vagina as possible.  This soak should be done after sexual intercourse and menstrual cycles.    GO WHITE: Soap: UNSCENTED Dove (white box light green writing) Laundry detergent (underwear)- Dreft or [employer] unscented WHITE 100% cotton panties (NOT just cotton crouch) Sanitary napkin/panty liners: UNSCENTED.  If it doesn't SAY unscented it can have a scent/perfume    NO PERFUMES OR LOTIONS OR POTIONS in the vulvar area (may use regular [place]) Condoms: hypoallergenic only. Non dyed (no color) Toilet papers: white only Wash clothes: use a separate wash cloth. WHITE.  Wash in [place].   You can purchase Tea Tree Oil locally at:  [employer] [address] [place] [address]  [employer] [address] [place] [address]  Advise that these alternatives will not replace the need to be evaluated if symptoms persist. You will need to seek care at an OB/GYN provider.

## 2021-01-09 NOTE — Progress Notes (Signed)
GYNECOLOGY PROBLEM  VISIT ENCOUNTER NOTE  Subjective:   Tonya Odonnell is a 55 y.o.  female here for a a problem GYN visit.  Current complaints: Vaginal itching-- seen by Dr. Amalia Hailey at [facility] and given diflucan x 3 to help with sx.  Has Breast cancer, having ongoing treatment.  On aromatase inhibitor. Dx in 2016.   Had pap and endometrial bx in 2021  Reports diflucan initially helped but then she lost the 2 additional doses she was given. The itching has been present since she moved from [place] in the spring.   She discussed her left sided symptoms she attributes to H1NI vaccination. She attributes her ovarian sx, pelvic pain, breast cancer and nerve pain [facility] Provided active listening.   Denies abnormal vaginal bleeding, discharge, pelvic pain, problems with intercourse or other gynecologic concerns.    Gynecologic History No LMP recorded (lmp unknown). Patient is postmenopausal. Contraception: post menopausal status Pap 03/22/2019 @[facility]  [place]. NIL, HPV negative  Health Maintenance Due  Topic Date Due   COVID-19 Vaccine (1) Never done   Pneumococcal Vaccine 24-15 Years old (1 - PCV) Never done   HIV Screening  Never done   Hepatitis C Screening  Never done   TETANUS/TDAP  Never done   Zoster Vaccines- Shingrix (1 of 2) Never done   COLONOSCOPY (Pts 45-82yrs Insurance coverage will need to be confirmed)  Never done    The following portions of the patient's history were reviewed and updated as appropriate: allergies, current medications, past family history, past medical history, past social history, past surgical history and problem list.  Review of Systems Pertinent items are noted in HPI.   Objective:  BP (!) 148/91   Pulse 62   Ht 5\' 3"  (1.6 m)   Wt 184 lb (83.5 kg)   LMP  (LMP Unknown)   BMI 32.59 kg/m  Gen: well appearing, NAD HEENT: no scleral icterus CV: RR Lung: Normal WOB Ext: warm well perfused  PELVIC:  Normal appearing external genitalia; normal appearing vaginal mucosa and cervix.  No abnormal discharge noted.  Atrophic tissue, whitening over lower introitus. Friable tissue. Normal uterine size, no other palpable masses, no uterine or adnexal tenderness.   Assessment and Plan:  1. Vaginal itching - Cervicovaginal ancillary only - fluconazole (DIFLUCAN) 150 MG tablet; Take 1 tablet (150 mg total) by mouth once for 1 dose. Can take additional dose three days later if symptoms persist  Dispense: 1 tablet; Refill: 3 - clobetasol cream (TEMOVATE) 0.05 %; Apply 1 application topically 2 (two) times daily. Apply to affected area  Dispense: 30 g; Refill: 2  2. Atrophic vaginitis Mostly likely itching is related to lack of estrogen.  No clear lichen sclerosis but low threshold to do punch if sx persist - fluconazole (DIFLUCAN) 150 MG tablet; Take 1 tablet (150 mg total) by mouth once for 1 dose. Can take additional dose three days later if symptoms persist  Dispense: 1 tablet; Refill: 3 - clobetasol cream (TEMOVATE) 0.05 %; Apply 1 application topically 2 (two) times daily. 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 Needs refill  - valACYclovir (VALTREX) 1000 MG tablet; Take 1 tablet (1,000 mg total) by mouth daily. Take for 5 days  Dispense: 5 tablet; Refill: 2    Please refer to After Visit Summary for other counseling recommendations.   Return if symptoms worsen or fail to  improve.  Caren Macadam, MD, MPH, ABFM Attending [facility]

## 2021-01-10 DIAGNOSIS — A6004 Herpesviral vulvovaginitis: Secondary | ICD-10-CM

## 2021-01-10 DIAGNOSIS — B001 Herpesviral vesicular dermatitis: Secondary | ICD-10-CM

## 2021-01-10 LAB — CERVICOVAGINAL ANCILLARY ONLY
Bacterial Vaginitis (gardnerella): NEGATIVE
Candida Glabrata: NEGATIVE
Candida Vaginitis: NEGATIVE
Chlamydia: NEGATIVE
Comment: NEGATIVE
Comment: NEGATIVE
Comment: NEGATIVE
Comment: NEGATIVE
Comment: NEGATIVE
Comment: NORMAL
Neisseria Gonorrhea: NEGATIVE
Trichomonas: NEGATIVE

## 2021-01-14 MED ORDER — VALACYCLOVIR HCL 500 MG PO TABS: 500.0000 mg | ORAL_TABLET | Freq: Every day | ORAL | 4 refills | Status: DC

## 2021-01-16 ENCOUNTER — Encounter: Payer: Self-pay | Admitting: Internal Medicine

## 2021-01-25 DIAGNOSIS — M8589 Other specified disorders of bone density and structure, multiple sites: Secondary | ICD-10-CM | POA: Diagnosis not present

## 2021-01-25 DIAGNOSIS — C50812 Malignant neoplasm of overlapping sites of left female breast: Secondary | ICD-10-CM | POA: Diagnosis not present

## 2021-01-25 DIAGNOSIS — Z79811 Long term (current) use of aromatase inhibitors: Secondary | ICD-10-CM | POA: Diagnosis not present

## 2021-01-25 DIAGNOSIS — L609 Nail disorder, unspecified: Secondary | ICD-10-CM | POA: Diagnosis not present

## 2021-01-25 DIAGNOSIS — Z170 Estrogen receptor positive status [ER+]: Secondary | ICD-10-CM | POA: Diagnosis not present

## 2021-01-25 DIAGNOSIS — Z1231 Encounter for screening mammogram for malignant neoplasm of breast: Secondary | ICD-10-CM | POA: Diagnosis not present

## 2021-01-25 NOTE — Telephone Encounter (Signed)
Pl let me know what we should do with this patient we had discussed her last visit.

## 2021-02-04 ENCOUNTER — Telehealth: Payer: Self-pay

## 2021-02-04 NOTE — Telephone Encounter (Signed)
Can we please speak to this patient about her referrals and see what she needs I belive Tonya Odonnell has a note in there. thnx

## 2021-02-04 NOTE — Telephone Encounter (Signed)
Called patient to inform her that she has an appointment with [facility], Dr. Nicole Kindred on 03/06/21 at 10am

## 2021-02-15 DIAGNOSIS — M8588 Other specified disorders of bone density and structure, other site: Secondary | ICD-10-CM | POA: Diagnosis not present

## 2021-02-15 DIAGNOSIS — M8589 Other specified disorders of bone density and structure, multiple sites: Secondary | ICD-10-CM | POA: Diagnosis not present

## 2021-02-15 DIAGNOSIS — M85852 Other specified disorders of bone density and structure, left thigh: Secondary | ICD-10-CM | POA: Diagnosis not present

## 2021-02-18 ENCOUNTER — Encounter: Payer: Self-pay | Admitting: Internal Medicine

## 2021-03-05 ENCOUNTER — Encounter: Payer: Self-pay | Admitting: Radiology

## 2021-03-06 ENCOUNTER — Other Ambulatory Visit: Payer: Self-pay

## 2021-03-06 ENCOUNTER — Ambulatory Visit: Payer: Medicaid Other | Admitting: Dermatology

## 2021-03-06 DIAGNOSIS — L659 Nonscarring hair loss, unspecified: Secondary | ICD-10-CM

## 2021-03-06 DIAGNOSIS — L719 Rosacea, unspecified: Secondary | ICD-10-CM

## 2021-03-06 DIAGNOSIS — L648 Other androgenic alopecia: Secondary | ICD-10-CM

## 2021-03-06 DIAGNOSIS — L650 Telogen effluvium: Secondary | ICD-10-CM | POA: Diagnosis not present

## 2021-03-06 DIAGNOSIS — L738 Other specified follicular disorders: Secondary | ICD-10-CM | POA: Diagnosis not present

## 2021-03-06 MED ORDER — MINOXIDIL 2.5 MG PO TABS: 2.5000 mg | ORAL_TABLET | Freq: Every day | ORAL | 1 refills | Status: DC

## 2021-03-06 MED ORDER — IVERMECTIN 1 % EX CREA: TOPICAL_CREAM | CUTANEOUS | 3 refills | Status: DC

## 2021-03-06 NOTE — Progress Notes (Signed)
New Patient Visit  Subjective  Tonya Odonnell is a 55 y.o. female who presents for the following: Hairloss (Scalp. Patient had a thyroid problem (low) started in Spring and has had recent hairloss. Her labs are currently being monitored by her PCP, last checked 11/2020. ) and Itching/Cracking (Bilateral ears.). Patient moved here in June from [place] which has been stressful. No recent surgeries or weight loss. Recent TSH and Hb/Hct are normal. She had Covid 1 year ago. She stopped taking estrogen blocking therapy about a month ago. No change in hair loss- still coming out all over. No family history of hair loss. No new medications.   Patient originally made appointment for a growth on her face, but it has since fallen off and is clear.  But she has red rash on face she would like addressed.   The following portions of the chart were reviewed this encounter and updated as appropriate:       Review of Systems:  No other skin or systemic complaints except as noted in HPI or Assessment and Plan.  Objective  Well appearing patient in no apparent distress; mood and affect are within normal limits.  A focused examination was performed including scalp. Relevant physical exam findings are noted in the Assessment and Plan.  Scalp Thinning of the crown, temporal, frontal hairline. Diffuse hair thinning of the entire scalp.            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 Patient prefers to take oral . Pt already taking biotin- may continue.  Start Loniten 2.5mg  take 1/2 tablet by mouth daily (Rx written to take 1 po QD) dsp #30 1Rf  BP 139/91  Photos taken today.   Telogen effluvium is a benign, self limited  condition causing increased hair shedding usually for several months. It does not progress to baldness, and the hair eventually grows back on its own. It can be triggered by recent illness, recent surgery, thyroid disease, low iron stores, vitamin D deficiency, fad diets or rapid weight loss, hormonal changes such as pregnancy or birth control pills, and some medication. Usually the hair loss starts 2-3 months after the illness or health change. Rarely, it can continue for longer than a year.   Androgenic alopecia is a chronic condition related to genetics and/or hormonal changes associated with menopause causing hair thinning primarily on the crown with widening of the part and temporal hairline recession.  Can use OTC Rogaine (minoxidil) 5% solution/foam or low dose oral minoxidil.   minoxidil (LONITEN) 2.5 MG tablet - Scalp Take 1 tablet (2.5 mg total) by mouth daily.  Rosacea face  With Sebaceous Hyperplasia  Start Soolantra Cream Apply to face qhs  Rosacea is a chronic progressive skin condition usually affecting the face of adults, causing redness and/or acne bumps. It is treatable but not curable. It sometimes affects the eyes (ocular rosacea) as well. It may respond to topical and/or systemic medication and can flare with stress, sun exposure, alcohol, exercise and some foods.  Daily application of broad spectrum spf 30+ sunscreen to face is recommended to reduce flares.  Ivermectin (SOOLANTRA) 1 % CREA - face Apply a small amount to face every night for rosacea.   Return in about 2  months (around 05/07/2021) for alopecia.  IJamesetta Orleans, CMA, am acting as scribe for Brendolyn Patty, MD .  Documentation: I have reviewed the above documentation for accuracy and completeness, and I agree with the above.  Brendolyn Patty MD

## 2021-03-06 NOTE — Patient Instructions (Addendum)
Loniten (minoxidil) - take 1/2 tablet by mouth every day for hair loss. Prescription will be written 1 pill a day.   Soolantra Cream - Apply to face every night for Rosacea.   Rosacea  What is rosacea? Rosacea (say: ro-zay-sha) is a common skin disease that usually begins as a trend of flushing or blushing easily.  As rosacea progresses, a persistent redness in the center of the face will develop and may gradually spread beyond the nose and cheeks to the forehead and chin.  In some cases, the ears, chest, and back could be affected.  Rosacea may appear as tiny blood vessels or small red bumps that occur in crops.  Frequently they can contain pus, and are called pustules.  If the bumps do not contain pus, they are referred to as papules.  Rarely, in prolonged, untreated cases of rosacea, the oil glands of the nose and cheeks may become permanently enlarged.  This is called rhinophyma, and is seen more frequently in men.  Signs and Risks In its beginning stages, rosacea tends to come and go, which makes it difficult to recognize.  It can start as intermittent flushing of the face.  Eventually, blood vessels may become permanently visible.  Pustules and papules can appear, but can be mistaken for adult acne.  People of all races, ages, genders and ethnic groups are at risk of developing rosacea.  However, it is more common in women (especially around menopause) and adults with fair skin between the ages of 29 and 36.  Treatment Dermatologists typically recommend a combination of treatments to effectively manage rosacea.  Treatment can improve symptoms and may stop the progression of the rosacea.  Treatment may involve both topical and oral medications.  The tetracycline antibiotics are often used for their anti-inflammatory effect; however, because of the possibility of developing antibiotic resistance, they should not be used long term at full dose.  For dilated blood vessels the options include  electrodessication (uses electric current through a small needle), laser treatment, and cosmetics to hide the redness.   With all forms of treatment, improvement is a slow process, and patients may not see any results for the first 3-4 weeks.  It is very important to avoid the sun and other triggers.  Patients must wear sunscreen daily.  Skin Care Instructions: Cleanse the skin with a mild soap such as CeraVe cleanser, Cetaphil cleanser, or Dove soap once or twice daily as needed. Moisturize with Eucerin Redness Relief Daily Perfecting Lotion (has a subtle green tint), CeraVe Moisturizing Cream, or Oil of Olay Daily Moisturizer with sunscreen every morning and/or night as recommended. Makeup should be non-comedogenic (wont clog pores) and be labeled for sensitive skin. Good choices for cosmetics are: Neutrogena, Almay, and Physicians Formula.  Any product with a green tint tends to offset a red complexion. If your eyes are dry and irritated, use artificial tears 2-3 times per day and cleanse the eyelids daily with baby shampoo.  Have your eyes examined at least every 2 years.  Be sure to tell your eye doctor that you have rosacea. Alcoholic beverages tend to cause flushing of the skin, and may make rosacea worse. Always wear sunscreen, protect your skin from extreme hot and cold temperatures, and avoid spicy foods, hot drinks, and mechanical irritation such as rubbing, scrubbing, or massaging the face.  Avoid harsh skin cleansers, cleansing masks, astringents, and exfoliation. If a particular product burns or makes your face feel tight, then it is likely to flare  your rosacea. If you are having difficulty finding a sunscreen that you can tolerate, you may try switching to a chemical-free sunscreen.  These are ones whose active ingredient is zinc oxide or titanium dioxide only.  They should also be fragrance free, non-comedogenic, and labeled for sensitive skin. Rosacea triggers may vary from person  to person.  There are a variety of foods that have been reported to trigger rosacea.  Some patients find that keeping a diary of what they were doing when they flared helps them avoid triggers.   If You Need Anything After Your Visit  If you have any questions or concerns for your doctor, please call our main line at [phone] and press option 4 to reach your doctor's medical assistant. If no one answers, please leave a voicemail as directed and we will return your call as soon as possible. Messages left after 4 pm will be answered the following business day.   You may also send [place] a message via [place]. We typically respond to MyChart messages within 1-2 business days.  For prescription refills, please ask your pharmacy to contact our office. Our fax number is [phone].  If you have an urgent issue when the clinic is closed that cannot wait until the next business day, you can page your doctor at the number below.    Please note that while we do our best to be available for urgent issues outside of office hours, we are not available 24/7.   If you have an urgent issue and are unable to reach [place], you may choose to seek medical care at your doctor's office, retail clinic, urgent care center, or emergency room.  If you have a medical emergency, please immediately call 911 or go to the emergency department.  Pager Numbers  - Dr. Nehemiah Massed: [phone]  - Dr. Laurence Ferrari: [phone]  - Dr. Nicole Kindred: [phone]  In the event of inclement weather, please call our main line at [phone] for an update on the status of any delays or closures.  Dermatology Medication Tips: Please keep the boxes that topical medications come in in order to help keep track of the instructions about where and how to use these. Pharmacies typically print the medication instructions only on the boxes and not directly on the medication tubes.   If your medication is too expensive, please contact our office at  [phone] option 4 or send [place] a message through [place].   We are unable to tell what your co-pay for medications will be in advance as this is different depending on your insurance coverage. However, we may be able to find a substitute medication at lower cost or fill out paperwork to get insurance to cover a needed medication.   If a prior authorization is required to get your medication covered by your insurance company, please allow [place] 1-2 business days to complete this process.  Drug prices often vary depending on where the prescription is filled and some pharmacies may offer cheaper prices.  The website www.goodrx.com contains coupons for medications through different pharmacies. The prices here do not account for what the cost may be with help from insurance (it may be cheaper with your insurance), but the website can give you the price if you did not use any insurance.  - You can print the associated coupon and take it with your prescription to the pharmacy.  - You may also stop by our office during regular business hours and pick up a GoodRx coupon card.  -  If you need your prescription sent electronically to a different pharmacy, notify our office through [facility] or by phone at [phone] option 4.     Si Usted Necesita Algo Despus de Su Visita  Tambin puede enviarnos un mensaje a travs de Pharmacist, community. Por lo general respondemos a los mensajes de MyChart en el transcurso de 1 a 2 das hbiles.  Para renovar recetas, por favor pida a su farmacia que se ponga en contacto con nuestra oficina. Harland Dingwall de fax es [place] [phone].  Si tiene un asunto urgente cuando la clnica est cerrada y que no puede esperar hasta el siguiente da hbil, puede llamar/localizar a su doctor(a) al nmero que aparece a continuacin.   Por favor, tenga en cuenta que aunque hacemos todo lo posible para estar disponibles para asuntos urgentes fuera del horario de [place], no estamos  disponibles las 24 horas del da, los 7 das de la [place].   Si tiene un problema urgente y no puede comunicarse con nosotros, puede optar por buscar atencin mdica  en el consultorio de su doctor(a), en una clnica privada, en un centro de atencin urgente o en una sala de emergencias.  Si tiene Engineering geologist, por favor llame inmediatamente al 911 o vaya a la sala de emergencias.  Nmeros de bper  - Dr. Nehemiah Massed: [phone]  - Dra. Moye: [phone]  - Dra. Nicole Kindred: [phone]  En caso de inclemencias del [place], por favor llame a Johnsie Kindred principal al [phone] para una actualizacin sobre el  de cualquier retraso o cierre.  Consejos para la medicacin en dermatologa: Por favor, guarde las cajas en las que vienen los medicamentos de uso tpico para ayudarle a seguir las instrucciones sobre dnde y cmo usarlos. Las farmacias generalmente imprimen las instrucciones del medicamento slo en las cajas y no directamente en los tubos del [place].   Si su medicamento es muy caro, por favor, pngase en contacto con Zigmund Daniel llamando al [phone] y presione la opcin 4 o envenos un mensaje a travs de Pharmacist, community.   No podemos decirle cul ser su copago por los medicamentos por adelantado ya que esto es diferente dependiendo de la cobertura de su seguro. Sin embargo, es posible que podamos encontrar un medicamento sustituto a Electrical engineer un formulario para que el seguro cubra el medicamento que se considera necesario.   Si se requiere una autorizacin previa para que su compaa de seguros [place] su medicamento, por favor permtanos de 1 a 2 das hbiles para completar este proceso.  Los precios de los medicamentos varan con frecuencia dependiendo del Environmental consultant de dnde se surte la receta y alguna farmacias pueden ofrecer precios ms baratos.  El sitio web www.goodrx.com tiene cupones para medicamentos de Airline pilot. Los precios aqu no  tienen en cuenta lo que podra costar con la ayuda del seguro (puede ser ms barato con su seguro), pero el sitio web puede darle el precio si no utiliz Research scientist (physical sciences).  - Puede imprimir el cupn correspondiente y llevarlo con su receta a la farmacia.  - Tambin puede pasar por nuestra oficina durante el horario de atencin regular y Charity fundraiser una tarjeta de cupones de GoodRx.  - Si necesita que su receta se enve electrnicamente a una farmacia diferente, informe a nuestra oficina a travs de MyChart de  o por telfono llamando al [phone] y presione la opcin 4.

## 2021-03-12 ENCOUNTER — Telehealth: Payer: Self-pay

## 2021-03-12 ENCOUNTER — Other Ambulatory Visit: Payer: Self-pay

## 2021-03-12 ENCOUNTER — Encounter: Payer: Self-pay | Admitting: Dermatology

## 2021-03-12 MED ORDER — METRONIDAZOLE 0.75 % EX CREA: TOPICAL_CREAM | CUTANEOUS | 3 refills | Status: DC

## 2021-03-12 NOTE — Telephone Encounter (Signed)
Soolantra not covered. Patient must first try MetroCream and MetroGel before PA would be approved.

## 2021-03-15 ENCOUNTER — Telehealth: Payer: Self-pay | Admitting: Internal Medicine

## 2021-03-15 NOTE — Telephone Encounter (Signed)
Appt has been switched and PCP has been updated

## 2021-03-15 NOTE — Telephone Encounter (Signed)
After reading a message from [place] from the pts husband, I thought that pt wanted to establish as a pt here. I noticed that she is already a pt. So I called to get more details. Pt's wife is unhappy with Dr. Neomia Dear and would like to have Jolene as a pt instead. Jolene says its okay. I know that protocol is I am supposed to send to [facility] to review the switch.

## 2021-03-19 ENCOUNTER — Inpatient Hospital Stay: Payer: Medicaid Other | Attending: Hematology and Oncology | Admitting: Adult Health

## 2021-03-19 ENCOUNTER — Other Ambulatory Visit: Payer: Self-pay

## 2021-03-19 VITALS — BP 138/90 | HR 64 | Temp 97.7°F | Resp 16 | Ht 63.0 in | Wt 189.6 lb

## 2021-03-19 DIAGNOSIS — Z9012 Acquired absence of left breast and nipple: Secondary | ICD-10-CM | POA: Diagnosis not present

## 2021-03-19 DIAGNOSIS — Z79899 Other long term (current) drug therapy: Secondary | ICD-10-CM | POA: Diagnosis not present

## 2021-03-19 DIAGNOSIS — Z79811 Long term (current) use of aromatase inhibitors: Secondary | ICD-10-CM | POA: Insufficient documentation

## 2021-03-19 DIAGNOSIS — E079 Disorder of thyroid, unspecified: Secondary | ICD-10-CM | POA: Insufficient documentation

## 2021-03-19 DIAGNOSIS — Z170 Estrogen receptor positive status [ER+]: Secondary | ICD-10-CM | POA: Insufficient documentation

## 2021-03-19 DIAGNOSIS — C50412 Malignant neoplasm of upper-outer quadrant of left female breast: Secondary | ICD-10-CM

## 2021-03-19 DIAGNOSIS — C50812 Malignant neoplasm of overlapping sites of left female breast: Secondary | ICD-10-CM | POA: Diagnosis present

## 2021-03-19 DIAGNOSIS — I10 Essential (primary) hypertension: Secondary | ICD-10-CM | POA: Insufficient documentation

## 2021-03-19 DIAGNOSIS — M549 Dorsalgia, unspecified: Secondary | ICD-10-CM | POA: Insufficient documentation

## 2021-03-19 NOTE — Progress Notes (Signed)
° °CLINIC:  °Survivorship  ° °REASON FOR VISIT:  °Routine follow-up for history of breast cancer.  ° °BRIEF ONCOLOGIC HISTORY:  °Oncology History  °Malignant neoplasm of female breast (HCC)  °08/2014 Initial Diagnosis  ° After many years of complains of swelling in the left side of the body and tenderness in her breast, she was diagnosed with grade 2 IDC 2.1 cm left breast 3 o'clock position °  °12/2014 Surgery  ° Left lumpectomy: Grade 2 multifocal invasive ductal carcinoma 1.6 cm, 0.3 cm, DCIS low-grade, lymphovascular invasion present, 1/2 lymph nodes positive ER positive greater than 95%, PR positive greater than 95%, HER2 equivocal 2+ by IHC and FISH was negative (Dr.Lo) °  °01/17/2015 Surgery  ° Left mastectomy (done because of close margins): Residual LCIS, 1/5 lymph nodes positive (total 2/10 lymph nodes positive) °  °01/17/2015 Oncotype testing  ° Oncotype DX score: 20 °  °2016 - 2017 Chemotherapy  ° Adjuvant chemotherapy (patient is not sure of what drugs she received but that she had 8 doses) °  °07/28/2015 -  Anti-estrogen oral therapy  ° Patient was on and off antiestrogen therapy since May 2017.  Unable to tolerate tamoxifen or Zoladex.  Eventually after she became menopausal, started on Arimidex January 2021 (takes Cymbalta for hot flashes) °  ° ° ° °INTERVAL HISTORY:  °Tonya Odonnell presents to the Survivorship Clinic today for routine follow-up for her history of breast cancer.  She is here today for survivorship care to discuss her history of breast cancer and continued pain after receiving the swine flu vaccine several years ago.  She tells me that she has experienced pain in her left arm around her shoulder down her back in a regional pattern.  She is tried many things for pain.  She had sought care from primary care previously and says that she was going to be referred to the pain clinic, however has not heard anything. ° °Tonya Odonnell is originally from [place].  She married an American and they lived in  [place] for several years and recently relocated to [place] during the pandemic. ° °She also notes that she had left mastectomy with reconstruction.  She is unsure of the implant type and wants to know when they should be exchanged and if any imaging needs to be done to evaluate for silent rupture. ° °She is due for repeat mammography in May.  In reviewing her chart she has appointment set up with Dr. Gudena and has previously been seeing an oncologist in [place].  She also has a primary care in [place] and an upcoming appointment with another primary care in [place]. ° ° ° °REVIEW OF SYSTEMS:  °Review of Systems  °Constitutional:  Negative for appetite change, chills, fatigue, fever and unexpected weight change.  °HENT:   Negative for hearing loss, lump/mass and trouble swallowing.   °Eyes:  Negative for eye problems and icterus.  °Respiratory:  Negative for chest tightness, cough and shortness of breath.   °Cardiovascular:  Negative for chest pain, leg swelling and palpitations.  °Gastrointestinal:  Negative for abdominal distention, abdominal pain, constipation, diarrhea, nausea and vomiting.  °Endocrine: Negative for hot flashes.  °Genitourinary:  Negative for difficulty urinating.   °Musculoskeletal:  Positive for back pain. Negative for arthralgias.  °Skin:  Negative for itching and rash.  °Neurological:  Negative for dizziness, extremity weakness, headaches and numbness.  °Hematological:  Negative for adenopathy. Does not bruise/bleed easily.  °Psychiatric/Behavioral:  Negative for depression. The patient is not nervous/anxious.   °  nervous/anxious.   Breast: Denies any new nodularity, masses, tenderness, nipple changes, or nipple discharge.       PAST MEDICAL/SURGICAL HISTORY:  Past Medical History:  Diagnosis Date   Breast cancer in female [facility])    Cancer ([place])    Hypertension    Thyroid disease    Past Surgical History:  Procedure Laterality Date   Left Ovarian removal Left      ALLERGIES:   Allergies  Allergen Reactions   Lisinopril Other (See Comments)    cough cough      CURRENT MEDICATIONS:  Outpatient Encounter Medications as of 03/19/2021  Medication Sig   anastrozole (ARIMIDEX) 1 MG tablet Take 1 tablet (1 mg total) by mouth daily.   Cholecalciferol 25 MCG (1000 UT) tablet Take 1 tablet (1,000 Units total) by mouth daily.   clobetasol cream (TEMOVATE) 1.61 % Apply 1 application topically 2 (two) times daily. Apply to affected area   fluticasone (FLONASE) 50 MCG/ACT nasal spray Place 2 sprays into both nostrils daily.   Ivermectin (SOOLANTRA) 1 % CREA Apply a small amount to face every night for rosacea.   levothyroxine (SYNTHROID) 100 MCG tablet Take 1 tablet (100 mcg total) by mouth every morning.   metroNIDAZOLE (METROCREAM) 0.75 % cream Apply to face 1-2 times a day for rosacea.   minoxidil (LONITEN) 2.5 MG tablet Take 1 tablet (2.5 mg total) by mouth daily.   omeprazole (PRILOSEC) 40 MG capsule Take 1 capsule (40 mg total) by mouth daily.   pyridoxine (B-6) 100 MG tablet Take 1 tablet (100 mg total) by mouth daily.   valACYclovir (VALTREX) 500 MG tablet Take 1 tablet (500 mg total) by mouth daily. Can increase to twice a day for 5 days in the event of a recurrence   metoprolol tartrate (LOPRESSOR) 50 MG tablet Take 1 tablet (50 mg total) by mouth 2 (two) times daily. Take by mouth.   No facility-administered encounter medications on file as of 03/19/2021.     ONCOLOGIC FAMILY HISTORY:  Family History  Problem Relation Age of Onset   Hypertension Mother      SOCIAL HISTORY:  Maryem Shuffler is married and lives with her husband in [place].  She is originally from [place] and her husband is Optometrist.  She got married and moved to [place] and became a [place] citizen.  They lived in [place] in a small town outside of [place] for many years.  Her husband decided that he wanted to retire in [place] during the pandemic and he bought  a house off of the Internet.  She enjoys her dogs and also enjoys listening to the news.   PHYSICAL EXAMINATION:  Vital Signs: Vitals:   03/19/21 1203  BP: 138/90  Pulse: 64  Resp: 16  Temp: 97.7 F (36.5 C)  SpO2: 100%   Filed Weights   03/19/21 1203  Weight: 189 lb 9.6 oz (86 kg)   GENERAL: Patient is a well appearing female in no acute distress HEENT:  Sclerae anicteric.  Oropharynx clear and moist. No ulcerations or evidence of oropharyngeal candidiasis. Neck is supple.  NODES:  No cervical, supraclavicular, or axillary lymphadenopathy palpated.  BREAST EXAM: Left breast status postmastectomy and reconstruction.  Right breast is benign. LUNGS:  Clear to auscultation bilaterally.  No wheezes or rhonchi. HEART:  Regular rate and rhythm. No murmur appreciated. ABDOMEN:  Soft, nontender.  Positive, normoactive bowel sounds. No organomegaly palpated. MSK:  No focal spinal tenderness to palpation. Full  bilaterally in the upper extremities. °EXTREMITIES:  No peripheral edema.   °SKIN:  Clear with no obvious rashes or skin changes. No nail dyscrasia. °NEURO:  Nonfocal. Well oriented.  Appropriate affect. ° ° °LABORATORY DATA:  °None for this visit °  °ASSESSMENT AND PLAN:  °Ms.. Halleck is a pleasant 55 y.o. 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  She presents to the Survivorship Clinic for evaluation. ° °1. History of breast cancer: She currently has no sign clinical or radiographic sign of recurrence.  She will continue to do annual mammograms next due in May.  We discussed the fact that she has been seeing Dr. Gudena along with an outside oncologist.  I recommended that for the continuum of her care that she follow-up with her oncologist of choice. ° °2.  Continued back pain: We placed a referral to [facility] pain clinic for evaluation and treatment. ° °3.   Breast reconstruction: I placed a referral for her to see Dr. Thimmappa to discuss her breast, reconstructive therapy that she has previously had.  And recommendations around replacing her implant versus monitoring for silent rupture. ° °4. Cancer screening:  Due to Ms. Moser's history and her age, she should receive screening for skin cancers, colon cancer, and gynecologic cancers. She was encouraged to follow-up with her PCP for appropriate cancer screenings.  ° °5. Health maintenance and wellness promotion: Ms. Stepka was encouraged to consume 5-7 servings of fruits and vegetables per day. She was also encouraged to engage in moderate to vigorous exercise for 30 minutes per day most days of the week. She was instructed to limit her alcohol consumption and continue to abstain from tobacco use. °  ° °Dispo:  °-Return to cancer center PRN ° °Total encounter time: 45 minutes in face-to-face visit time, chart review, lab review, care coordination, order entry, and documentation of the encounter. ° ° ° ° Cornetto , NP °Survivorship Program °[facility] °[phone] ° ° °Note: PRIMARY CARE PROVIDER °Cannady, Jolene T, NP °[phone] °[phone] ° °

## 2021-03-23 ENCOUNTER — Encounter: Payer: Self-pay | Admitting: Internal Medicine

## 2021-03-28 ENCOUNTER — Telehealth: Payer: Self-pay | Admitting: *Deleted

## 2021-03-28 NOTE — Telephone Encounter (Signed)
Faxed office notes over to [facility] at [place] at [phone]. Confirmation received

## 2021-04-03 ENCOUNTER — Ambulatory Visit: Payer: Medicaid Other | Admitting: Family

## 2021-04-17 DIAGNOSIS — Z9013 Acquired absence of bilateral breasts and nipples: Secondary | ICD-10-CM | POA: Diagnosis not present

## 2021-04-17 DIAGNOSIS — Z853 Personal history of malignant neoplasm of breast: Secondary | ICD-10-CM | POA: Diagnosis not present

## 2021-04-17 DIAGNOSIS — Z9889 Other specified postprocedural states: Secondary | ICD-10-CM | POA: Diagnosis not present

## 2021-04-30 ENCOUNTER — Ambulatory Visit: Payer: Medicaid Other | Admitting: Internal Medicine

## 2021-05-05 DIAGNOSIS — E669 Obesity, unspecified: Secondary | ICD-10-CM | POA: Insufficient documentation

## 2021-05-05 DIAGNOSIS — M8588 Other specified disorders of bone density and structure, other site: Secondary | ICD-10-CM | POA: Insufficient documentation

## 2021-05-05 NOTE — Patient Instructions (Signed)

## 2021-05-06 ENCOUNTER — Ambulatory Visit (INDEPENDENT_AMBULATORY_CARE_PROVIDER_SITE_OTHER): Payer: Medicaid Other | Admitting: Nurse Practitioner

## 2021-05-06 ENCOUNTER — Other Ambulatory Visit: Payer: Self-pay

## 2021-05-06 ENCOUNTER — Encounter: Payer: Self-pay | Admitting: Nurse Practitioner

## 2021-05-06 VITALS — BP 134/80 | HR 76 | Temp 97.9°F | Ht 63.0 in | Wt 188.8 lb

## 2021-05-06 DIAGNOSIS — M797 Fibromyalgia: Secondary | ICD-10-CM | POA: Diagnosis not present

## 2021-05-06 DIAGNOSIS — M8588 Other specified disorders of bone density and structure, other site: Secondary | ICD-10-CM

## 2021-05-06 DIAGNOSIS — Z853 Personal history of malignant neoplasm of breast: Secondary | ICD-10-CM | POA: Diagnosis not present

## 2021-05-06 DIAGNOSIS — Z6833 Body mass index (BMI) 33.0-33.9, adult: Secondary | ICD-10-CM

## 2021-05-06 DIAGNOSIS — R7303 Prediabetes: Secondary | ICD-10-CM

## 2021-05-06 DIAGNOSIS — E782 Mixed hyperlipidemia: Secondary | ICD-10-CM

## 2021-05-06 DIAGNOSIS — E6609 Other obesity due to excess calories: Secondary | ICD-10-CM

## 2021-05-06 DIAGNOSIS — E039 Hypothyroidism, unspecified: Secondary | ICD-10-CM

## 2021-05-06 DIAGNOSIS — M2550 Pain in unspecified joint: Secondary | ICD-10-CM | POA: Diagnosis not present

## 2021-05-06 DIAGNOSIS — C50412 Malignant neoplasm of upper-outer quadrant of left female breast: Secondary | ICD-10-CM

## 2021-05-06 DIAGNOSIS — I10 Essential (primary) hypertension: Secondary | ICD-10-CM

## 2021-05-06 DIAGNOSIS — G8929 Other chronic pain: Secondary | ICD-10-CM | POA: Insufficient documentation

## 2021-05-06 DIAGNOSIS — K219 Gastro-esophageal reflux disease without esophagitis: Secondary | ICD-10-CM

## 2021-05-06 LAB — MICROALBUMIN, URINE WAIVED
Creatinine, Urine Waived: 50 mg/dL (ref 10–300)
Microalb, Ur Waived: 10 mg/L (ref 0–19)
Microalb/Creat Ratio: <30 mg/g (ref <30)

## 2021-05-06 LAB — BAYER DCA HB A1C WAIVED: HB A1C (BAYER DCA - WAIVED): 5.3 % (ref 4.8–5.6)

## 2021-05-06 NOTE — Assessment & Plan Note (Signed)
Chronic, ongoing.  BP at goal on recheck today.  Recommend she monitor BP at least a few mornings a week at home and document.  DASH diet at home.  Continue current medication regimen and adjust as needed.  Labs today: CBC, CMP, TSH, urine ALB.  Return in 6 months.

## 2021-05-06 NOTE — Assessment & Plan Note (Signed)
Chronic, ongoing. Continue current medication regimen and adjust as needed based on labs.  TSH, antibody, and Free T4.

## 2021-05-06 NOTE — Progress Notes (Signed)
° °BP 134/80 (BP Location: Left Arm, Patient Position: Sitting, Cuff Size: Large)    Pulse 76    Temp 97.9 °F (36.6 °C)    Ht 5' 3" (1.6 m)    Wt 188 lb 12.8 oz (85.6 kg)    LMP  (LMP Unknown)    SpO2 96%    BMI 33.44 kg/m²   ° °Subjective:  ° ° Patient ID: Tonya Odonnell, female    DOB: 06/05/1965, 56 y.o.   MRN: [id number] ° °HPI: °Tonya Odonnell is a 56 y.o. female ° °Chief Complaint  °Patient presents with  ° Breast Cancer  ° Hyperlipidemia  ° Hypertension  ° Hypothyroidism  ° Referral  °  Patient is requesting a referral to discuss about her Arthritis, Nutrient Specialist, Bone Specialist.   °  ° °BREAST CANCER: °History of breast cancer in 2016, left side.  Saw oncology last 03/19/21.  Saw plastic surgery on 04/17/21 -- seeing them about need for exchange new implants and reconstruction due to initial implants being placed too high.  Last mammogram 07/09/2020.  Only had chemo.  Her cancer started after swine flu vaccine she reports.   ° °ARTHRALGIAS / JOINT ACHES/ FIBROMYALGIA °Recent bone density testing - osteopenia on scan.  She is concerned as has underlying bone pain at baseline -- this started after swine flu vaccine -- had underlying nerve pain to full left side.  Was placed on Nortriptyline, which she has now stopped.  Took for 6-9 months prior to her breast surgeries.  Is interested in referral to arthritis or bone provider.  Last Vitamin D level 08/06/17.  Took Lyrica - did not help pain + has tried Cymbalta and Gabapentin without benefit.   ° °Has pain 24/7 to every muscle and bone, feels like when you have too much exercise everything feels sore daily.  Better in morning after she gets up.   Needs soft chairs.  No history of tick bites. °Duration: months -- chronic tall over °Pain: yes °Symmetric: yes ° 8/10 °Quality: dull, aching, and throbbing °Frequency: constant °Context:  worse °Decreased function/range of motion: yes -- decreased in hand function °Erythema: none °Swelling: yes °Heat or warmth:  yes °Morning stiffness: yes °Aggravating factors: unknown °Alleviating factors: only not moving °Relief with NSAIDs?: moderate °Treatments attempted:  Ibuprofen and CBD oil  °Involved Joints: all joints. ° °HYPERTENSION / HYPERLIPIDEMIA °Continues on Metoprolol XR 50 MG BID, is not interested in taking medication for cholesterol.  Would prefer to see nutritionist to discussed diet.  History of elevation on sugars in past with recent A1c 5.3%. ° °Continues on Omeprazole for GERD which offers benefit. °Satisfied with current treatment? yes °Duration of hypertension: chronic °BP monitoring frequency: not checking °BP range:  °BP medication side effects: no °Past BP meds:  °Cholesterol supplements: none °Aspirin: no °Recent stressors: no °Recurrent headaches: no °Visual changes: no °Palpitations: no °Dyspnea: no °Chest pain: no °Lower extremity edema: no °Dizzy/lightheaded: no  °The 10-year ASCVD risk score (Arnett DK, et al., 2019) is: 3.1% °  Values used to calculate the score: °    Age: 56 years °    Sex: Female °    Is Non-Hispanic African American: No °    Diabetic: No °    Tobacco smoker: No °    Systolic Blood Pressure: 134 mmHg °    Is BP treated: Yes °    HDL Cholesterol: 64 MG/dL °    Total Cholesterol: 244 MG/dL ° °HYPOTHYROIDISM °Taking Levothyroxine 100 MCG daily.    In spring they changed her thyroid medication as level was going up and down, was on 125 MCG prior.  Is losing hair since medication changes -- is seeing dermatology who placed her on medication for this.   ° °She was told in past she needs to check parathyroid ever year. °Thyroid control status:stable °Satisfied with current treatment? yes °Medication side effects: no °Medication compliance: good compliance °Etiology of hypothyroidism:  °Recent dose adjustment:no °Fatigue: yes °Cold intolerance: no °Heat intolerance: no °Weight gain: no °Weight loss: no °Constipation: no °Diarrhea/loose stools: no °Palpitations: no °Lower extremity edema:  no °Anxiety/depressed mood: no  ° °Relevant past medical, surgical, family and social history reviewed and updated as indicated. Interim medical history since our last visit reviewed. °Allergies and medications reviewed and updated. ° °Review of Systems  °Constitutional:  Negative for activity change, appetite change, diaphoresis, fatigue and fever.  °Respiratory:  Negative for cough, chest tightness and shortness of breath.   °Cardiovascular:  Negative for chest pain, palpitations and leg swelling.  °Gastrointestinal: Negative.   °Endocrine: Negative for cold intolerance, heat intolerance, polydipsia, polyphagia and polyuria.  °Musculoskeletal:  Positive for arthralgias.  °Neurological: Negative.   °Psychiatric/Behavioral: Negative.    ° °Per HPI unless specifically indicated above ° °   °Objective:  °  °BP 134/80 (BP Location: Left Arm, Patient Position: Sitting, Cuff Size: Large)    Pulse 76    Temp 97.9 °F (36.6 °C)    Ht 5' 3" (1.6 m)    Wt 188 lb 12.8 oz (85.6 kg)    LMP  (LMP Unknown)    SpO2 96%    BMI 33.44 kg/m²   °Wt Readings from Last 3 Encounters:  °05/06/21 188 lb 12.8 oz (85.6 kg)  °03/19/21 189 lb 9.6 oz (86 kg)  °01/09/21 184 lb (83.5 kg)  °  °Physical Exam °Vitals and nursing note reviewed.  °Constitutional:   °   General: She is awake. She is not in acute distress. °   Appearance: She is well-developed and well-groomed. She is obese. She is not ill-appearing or toxic-appearing.  °HENT:  °   Head: Normocephalic.  °   Right Ear: Hearing normal.  °   Left Ear: Hearing normal.  °Eyes:  °   General: Lids are normal.     °   Right eye: No discharge.     °   Left eye: No discharge.  °   Conjunctiva/sclera: Conjunctivae normal.  °   Pupils: Pupils are equal, round, and reactive to light.  °Neck:  °   Thyroid: No thyromegaly.  °   Vascular: No carotid bruit.  °Cardiovascular:  °   Rate and Rhythm: Normal rate and regular rhythm.  °   Heart sounds: Normal heart sounds. No murmur heard. °  No gallop.   °Pulmonary:  °   Effort: Pulmonary effort is normal. No accessory muscle usage or respiratory distress.  °   Breath sounds: Normal breath sounds.  °Abdominal:  °   General: Bowel sounds are normal.  °   Palpations: Abdomen is soft. There is no hepatomegaly or splenomegaly.  °Musculoskeletal:  °   Cervical back: Normal range of motion and neck supple.  °   Right lower leg: No edema.  °   Left lower leg: No edema.  °Lymphadenopathy:  °   Cervical: No cervical adenopathy.  °Skin: °   General: Skin is warm and dry.  °Neurological:  °   Mental Status: She is alert and oriented   and oriented to person, place, and time.  Psychiatric:        Attention and Perception: Attention normal.        Mood and Affect: Mood normal.        Speech: Speech normal.        Behavior: Behavior normal. Behavior is cooperative.        Thought Content: Thought content normal.   Results for orders placed or performed in visit on 05/06/21  Bayer DCA Hb A1c Waived  Result Value Ref Range   HB A1C (BAYER DCA - WAIVED) 5.3 4.8 - 5.6 %  Microalbumin, Urine Waived  Result Value Ref Range   Microalb, Ur Waived 10 0 - 19 mg/L   Creatinine, Urine Waived 50 10 - 300 mg/dL   Microalb/Creat Ratio <30 <30 mg/g      Assessment & Plan:   Problem List Items Addressed This Visit       Cardiovascular and Mediastinum   Essential (primary) hypertension    Chronic, ongoing.  BP at goal on recheck today.  Recommend she monitor BP at least a few mornings a week at home and document.  DASH diet at home.  Continue current medication regimen and adjust as needed.  Labs today: CBC, CMP, TSH, urine ALB.  Return in 6 months.      Relevant Medications   metoprolol succinate (TOPROL-XL) 50 MG 24 hr tablet   Other Relevant Orders   Microalbumin, Urine Waived (Completed)   Comprehensive metabolic panel     Digestive   Gastroesophageal reflux disease without esophagitis    Chronic, ongoing with use of Omeprazole.  At this time continue medication regimen  and adjust as needed.  Would benefit from trial of reduction in future.  Mag level today.      Relevant Orders   Magnesium     Endocrine   Hypothyroidism, unspecified    Chronic, ongoing. Continue current medication regimen and adjust as needed based on labs.  TSH, antibody, and Free T4.      Relevant Medications   metoprolol succinate (TOPROL-XL) 50 MG 24 hr tablet   Other Relevant Orders   TSH   Thyroid peroxidase antibody   T4, free   PTH, Intact and Calcium     Musculoskeletal and Integument   Osteopenia of lumbar spine    Noted on recent imaging, check Vit D level today and continue supplements at home.  Labs today.        Other   Fibromyalgia syndrome    Chronic, ongoing.  Obtain labs today and assess.  Referral to pain clinic.        Relevant Orders   Ambulatory referral to Pain Clinic   History of breast cancer - Primary    History of in 2016 -- continue collaboration with oncology and plastic surgery.      Hyperlipidemia   Relevant Medications   metoprolol succinate (TOPROL-XL) 50 MG 24 hr tablet   Other Relevant Orders   Comprehensive metabolic panel   Lipid Panel w/o Chol/HDL Ratio   Amb Referral to Nutrition and Diabetic Education   Multiple joint pain    Refer to fibromyalgia plan of care.      Relevant Orders   Vitamin B12   VITAMIN D 25 Hydroxy (Vit-D Deficiency, Fractures)   C-reactive protein   Sed Rate (ESR)   ANA w/Reflex if Positive   CK Total (and CKMB)   Ambulatory referral to Pain Clinic   Obesity    BMI  Recommended eating smaller high protein, low fat meals more frequently and exercising 30 mins a day 5 times a week with a goal of 10-15lb weight loss in the next 3 months. Patient voiced their understanding and motivation to adhere to these recommendations. °  °  ° Prediabetes  °  Noted on past labs, check level today and recommend diet focus.  Nutritionist referral in place. °  °  ° Relevant Orders  ° Bayer DCA Hb A1c Waived  (Completed)  ° Microalbumin, Urine Waived (Completed)  °  ° °Follow up plan: °Return in about 6 weeks (around 06/17/2021) for Fibromyalgia. ° ° ° ° ° °

## 2021-05-06 NOTE — Assessment & Plan Note (Signed)
BMI 33.44.  Recommended eating smaller high protein, low fat meals more frequently and exercising 30 mins a day 5 times a week with a goal of 10-15lb weight loss in the next 3 months. Patient voiced their understanding and motivation to adhere to these recommendations.

## 2021-05-06 NOTE — Assessment & Plan Note (Signed)
Noted on past labs, check level today and recommend diet focus.  Nutritionist referral in place.

## 2021-05-06 NOTE — Assessment & Plan Note (Signed)
History of in 2016 -- continue collaboration with oncology and plastic surgery.

## 2021-05-06 NOTE — Assessment & Plan Note (Signed)
Chronic, ongoing.  Obtain labs today and assess.  Referral to pain clinic.

## 2021-05-06 NOTE — Assessment & Plan Note (Signed)
Noted on recent imaging, check Vit D level today and continue supplements at home.  Labs today.

## 2021-05-06 NOTE — Assessment & Plan Note (Signed)
Chronic, ongoing with use of Omeprazole.  At this time continue medication regimen and adjust as needed.  Would benefit from trial of reduction in future.  Mag level today.

## 2021-05-06 NOTE — Assessment & Plan Note (Signed)
Refer to fibromyalgia plan of care. 

## 2021-05-07 ENCOUNTER — Ambulatory Visit (INDEPENDENT_AMBULATORY_CARE_PROVIDER_SITE_OTHER): Payer: Medicaid Other | Admitting: Dermatology

## 2021-05-07 ENCOUNTER — Encounter: Payer: Self-pay | Admitting: Nurse Practitioner

## 2021-05-07 DIAGNOSIS — L649 Androgenic alopecia, unspecified: Secondary | ICD-10-CM | POA: Diagnosis not present

## 2021-05-07 DIAGNOSIS — L650 Telogen effluvium: Secondary | ICD-10-CM

## 2021-05-07 DIAGNOSIS — Z853 Personal history of malignant neoplasm of breast: Secondary | ICD-10-CM | POA: Diagnosis not present

## 2021-05-07 DIAGNOSIS — M5412 Radiculopathy, cervical region: Secondary | ICD-10-CM | POA: Diagnosis not present

## 2021-05-07 DIAGNOSIS — L659 Nonscarring hair loss, unspecified: Secondary | ICD-10-CM

## 2021-05-07 DIAGNOSIS — I10 Essential (primary) hypertension: Secondary | ICD-10-CM | POA: Diagnosis not present

## 2021-05-07 DIAGNOSIS — Z6835 Body mass index (BMI) 35.0-35.9, adult: Secondary | ICD-10-CM | POA: Diagnosis not present

## 2021-05-07 DIAGNOSIS — M546 Pain in thoracic spine: Secondary | ICD-10-CM | POA: Diagnosis not present

## 2021-05-07 DIAGNOSIS — E063 Autoimmune thyroiditis: Secondary | ICD-10-CM

## 2021-05-07 DIAGNOSIS — M5416 Radiculopathy, lumbar region: Secondary | ICD-10-CM | POA: Diagnosis not present

## 2021-05-07 LAB — VITAMIN D 25 HYDROXY (VIT D DEFICIENCY, FRACTURES): Vit D, 25-Hydroxy: 35.1 ng/mL (ref 30.0–100.0)

## 2021-05-07 LAB — COMPREHENSIVE METABOLIC PANEL
ALT: 36 IU/L — ABNORMAL HIGH (ref 0–32)
AST: 28 IU/L (ref 0–40)
Albumin/Globulin Ratio: 1.8 (ref 1.2–2.2)
Albumin: 4.7 g/dL (ref 3.8–4.9)
Alkaline Phosphatase: 109 IU/L (ref 44–121)
BUN/Creatinine Ratio: 28 — ABNORMAL HIGH (ref 9–23)
BUN: 18 mg/dL (ref 6–24)
Bilirubin Total: 1 mg/dL (ref 0.0–1.2)
CO2: 21 mmol/L (ref 20–29)
Calcium: 9.8 mg/dL (ref 8.7–10.2)
Chloride: 104 mmol/L (ref 96–106)
Creatinine, Ser: 0.65 mg/dL (ref 0.57–1.00)
Globulin, Total: 2.6 g/dL (ref 1.5–4.5)
Glucose: 82 mg/dL (ref 70–99)
Potassium: 4.1 mmol/L (ref 3.5–5.2)
Sodium: 140 mmol/L (ref 134–144)
Total Protein: 7.3 g/dL (ref 6.0–8.5)
eGFR: 104 mL/min/{1.73_m2} (ref >59)

## 2021-05-07 LAB — SEDIMENTATION RATE: Sed Rate: 27 mm/hr (ref 0–40)

## 2021-05-07 LAB — C-REACTIVE PROTEIN: CRP: 1 mg/L (ref 0–10)

## 2021-05-07 LAB — CK TOTAL AND CKMB (NOT AT ARMC)
CK-MB Index: 1.4 ng/mL (ref 0.0–5.3)
Total CK: 37 U/L (ref 32–182)

## 2021-05-07 LAB — TSH: TSH: 4.26 u[IU]/mL (ref 0.450–4.500)

## 2021-05-07 LAB — THYROID PEROXIDASE ANTIBODY: Thyroperoxidase Ab SerPl-aCnc: 546 IU/mL — ABNORMAL HIGH (ref 0–34)

## 2021-05-07 LAB — LIPID PANEL W/O CHOL/HDL RATIO
Cholesterol, Total: 278 mg/dL — ABNORMAL HIGH (ref 100–199)
HDL: 80 mg/dL (ref >39)
LDL Chol Calc (NIH): 174 mg/dL — ABNORMAL HIGH (ref 0–99)
Triglycerides: 138 mg/dL (ref 0–149)
VLDL Cholesterol Cal: 24 mg/dL (ref 5–40)

## 2021-05-07 LAB — MAGNESIUM: Magnesium: 2 mg/dL (ref 1.6–2.3)

## 2021-05-07 LAB — ANA W/REFLEX IF POSITIVE: Anti Nuclear Antibody (ANA): NEGATIVE

## 2021-05-07 LAB — VITAMIN B12: Vitamin B-12: 616 pg/mL (ref 232–1245)

## 2021-05-07 LAB — T4, FREE: Free T4: 1.61 ng/dL (ref 0.82–1.77)

## 2021-05-07 LAB — PTH, INTACT AND CALCIUM: PTH: 20 pg/mL (ref 15–65)

## 2021-05-07 MED ORDER — MINOXIDIL 2.5 MG PO TABS: 2.5000 mg | ORAL_TABLET | Freq: Every day | ORAL | 1 refills | Status: DC

## 2021-05-07 NOTE — Progress Notes (Signed)
Follow-Up Visit   Subjective  Tonya Odonnell is a 56 y.o. female who presents for the following: Follow-up (Patient here today for 2 month follow on alopecia. Patient has been using loniten 2.5 mg 1/2 tab daily. She reports she has not noticed much change since last visit with hair growth. She has noticed a lot of hair growth on face. ). Including upper lip. She isn't losing as much hair as before, but she thinks it's because she cut her hair shorter, and so it doesn't seem as much. No other side effect from the oral minoxidil.  She stopped the biotin since it was upsetting her stomach.  She has a h/o breast cancer.  The following portions of the chart were reviewed this encounter and updated as appropriate:      Review of Systems: No other skin or systemic complaints except as noted in HPI or Assessment and Plan.   Objective  Well appearing patient in no apparent distress; mood and affect are within normal limits.  A focused examination was performed including scalp, face, legs. Relevant physical exam findings are noted in the Assessment and Plan.  Scalp Diffuse thinning of the crown and widening of the midline part with retention of the frontal hairline - Reviewed progressive nature and prognosis. Thinning of the crown, temporal, frontal hairline with slight improvement noted regrowth of temporal hairline and narrowing of midline part.   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   Discussed adding finasteride 5 mg take 1/2 tablet by mouth daily. Recommend checking with Oncologist first due to h/o breast cancer. Patient will let us know if she is cleared by oncologist and would like to start treatment.     Continue Loniten 2.5mg  take 1/2 tablet by mouth daily (Rx written to take 1 po QD) dsp #30 1Rf   BP 154/97    Telogen effluvium is a  benign, self limited condition causing increased hair shedding usually for several months. It does not progress to baldness, and the hair eventually grows back on its own. It can be triggered by recent illness, recent surgery, thyroid disease, low iron stores, vitamin D deficiency, fad diets or rapid weight loss, hormonal changes such as pregnancy or birth control pills, and some medication. Usually the hair loss starts 2-3 months after the illness or health change. Rarely, it can continue for longer than a year.    Female Androgenic Alopecia is a chronic condition related to genetics and/or hormonal changes.  In women androgenetic alopecia is commonly associated with menopause but may occur any time after puberty.  It causes hair thinning primarily on the crown with widening of the part and temporal hairline recession.  Can use OTC Rogaine (minoxidil) 5% solution/foam as directed.  Oral treatments in female patients who have no contraindication may include : - Low dose oral minoxidil 1.25 - 5mg  daily - Spironolactone 50 - 100mg  bid - Finasteride 2.5 - 5 mg daily Adjunctive therapies include: - Low Level Laser Light Therapy (LLLT) - Platelet-rich plasma injections (PRP) - Hair Transplants or scalp reduction   minoxidil (LONITEN) 2.5 MG tablet - Scalp Take 1 tablet (2.5 mg total) by mouth daily.   Return for 4 month alopecia follow up. I, Ruthell Rummage, CMA, am acting as scribe for Brendolyn Patty, MD.  Documentation: I have reviewed the above documentation for accuracy and completeness, and  I agree with the above.  Brendolyn Patty MD

## 2021-05-07 NOTE — Progress Notes (Signed)
Contacted via [place] The 10-year ASCVD risk score (Arnett DK, et al., 2019) is: 2.9%   Values used to calculate the score:     Age: 56 years     Sex: Female     Is Non-Hispanic African American: No     Diabetic: No     Tobacco smoker: No     Systolic Blood Pressure: 200 mmHg     Is BP treated: Yes     HDL Cholesterol: 80 mg/dL     Total Cholesterol: 278 mg/dL   Good evening Tonya Odonnell, your labs have returned: - Thyroid labs show normal TSH and Free T4, continue Levothyroxine at current dosing, but thyroid antibody as I discussed is elevated, showing more likely cause of thyroid disease is Hashimoto's.  Definitely read on diet changes that can be made to help this too. - Kidney function, creatinine and eGFR, remains normal. Liver function, ALT and AST, is overall stable.  Parathyroid, PTH, is also normal. - B12 and Vitamin D levels are normal. - Inflammatory labs -- CRP, ESR, ANA, CK are all normal. - Your cholesterol is still high, but continued recommendations to make lifestyle changes. Your LDL is above normal. The LDL is the bad cholesterol. Over time and in combination with inflammation and other factors, this contributes to plaque which in turn may lead to stroke and/or heart attack down the road. Sometimes high LDL is primarily genetic, and people might be eating all the right foods but still have high numbers. Other times, there is room for improvement in one's diet and eating healthier can bring this number down and potentially reduce one's risk of heart attack and/or stroke.   To reduce your LDL, Remember - more fruits and vegetables, more fish, and limit red meat and dairy products. More soy, nuts, beans, barley, lentils, oats and plant sterol ester enriched margarine instead of butter. I also encourage eliminating sugar and processed food. Remember, shop on the outside of the grocery store and visit your [employer]. If you would like to talk with me about dietary changes  plus or minus medications for your cholesterol, please let me know. We should recheck your cholesterol in 3-6 months.  Any questions on these? Keep being amazing!!  Thank you for allowing me to participate in your care.  I appreciate you. Kindest regards, Jenai Scaletta

## 2021-05-07 NOTE — Patient Instructions (Addendum)
Ask Oncologist about finasteride 5 mg tablet - 1/2 (2.5 mg) tablet by mouth daily    Send message if you would like to start finasteride and is ok to take per Oncologist     If You Need Anything After Your Visit  If you have any questions or concerns for your doctor, please call our main line at [phone] and press option 4 to reach your doctor's medical assistant. If no one answers, please leave a voicemail as directed and we will return your call as soon as possible. Messages left after 4 pm will be answered the following business day.   You may also send [place] a message via [place]. We typically respond to MyChart messages within 1-2 business days.  For prescription refills, please ask your pharmacy to contact our office. Our fax number is [phone].  If you have an urgent issue when the clinic is closed that cannot wait until the next business day, you can page your doctor at the number below.    Please note that while we do our best to be available for urgent issues outside of office hours, we are not available 24/7.   If you have an urgent issue and are unable to reach [place], you may choose to seek medical care at your doctor's office, retail clinic, urgent care center, or emergency room.  If you have a medical emergency, please immediately call 911 or go to the emergency department.  Pager Numbers  - Dr. Nehemiah Massed: [phone]  - Dr. Laurence Ferrari: [phone]  - Dr. Nicole Kindred: [phone]  In the event of inclement weather, please call our main line at [phone] for an update on the status of any delays or closures.  Dermatology Medication Tips: Please keep the boxes that topical medications come in in order to help keep track of the instructions about where and how to use these. Pharmacies typically print the medication instructions only on the boxes and not directly on the medication tubes.   If your medication is too expensive, please contact our office at [phone] option  4 or send [place] a message through [place].   We are unable to tell what your co-pay for medications will be in advance as this is different depending on your insurance coverage. However, we may be able to find a substitute medication at lower cost or fill out paperwork to get insurance to cover a needed medication.   If a prior authorization is required to get your medication covered by your insurance company, please allow [place] 1-2 business days to complete this process.  Drug prices often vary depending on where the prescription is filled and some pharmacies may offer cheaper prices.  The website www.goodrx.com contains coupons for medications through different pharmacies. The prices here do not account for what the cost may be with help from insurance (it may be cheaper with your insurance), but the website can give you the price if you did not use any insurance.  - You can print the associated coupon and take it with your prescription to the pharmacy.  - You may also stop by our office during regular business hours and pick up a GoodRx coupon card.  - If you need your prescription sent electronically to a different pharmacy, notify our office through [facility] or by phone at [phone] option 4.     Si Usted Necesita Algo Despus de Su Visita  Tambin puede enviarnos un mensaje a travs de Pharmacist, community. Por lo general respondemos a los mensajes de  MyChart en el transcurso de 1 a 2 das hbiles.  Para renovar recetas, por favor pida a su farmacia que se ponga en contacto con nuestra oficina. Harland Dingwall de fax es [place] [phone].  Si tiene un asunto urgente cuando la clnica est cerrada y que no puede esperar hasta el siguiente da hbil, puede llamar/localizar a su doctor(a) al nmero que aparece a continuacin.   Por favor, tenga en cuenta que aunque hacemos todo lo posible para estar disponibles para asuntos urgentes fuera del horario de [place], no estamos disponibles las 24 horas  del da, los 7 das de la [place].   Si tiene un problema urgente y no puede comunicarse con nosotros, puede optar por buscar atencin mdica  en el consultorio de su doctor(a), en una clnica privada, en un centro de atencin urgente o en una sala de emergencias.  Si tiene Engineering geologist, por favor llame inmediatamente al 911 o vaya a la sala de emergencias.  Nmeros de bper  - Dr. Nehemiah Massed: [phone]  - Dra. Moye: [phone]  - Dra. Nicole Kindred: [phone]  En caso de inclemencias del [place], por favor llame a Johnsie Kindred principal al [phone] para una actualizacin sobre el [place] de cualquier retraso o cierre.  Consejos para la medicacin en dermatologa: Por favor, guarde las cajas en las que vienen los medicamentos de uso tpico para ayudarle a seguir las instrucciones sobre dnde y cmo usarlos. Las farmacias generalmente imprimen las instrucciones del medicamento slo en las cajas y no directamente en los tubos del [place].   Si su medicamento es muy caro, por favor, pngase en contacto con Zigmund Daniel llamando al [phone] y presione la opcin 4 o envenos un mensaje a travs de Pharmacist, community.   No podemos decirle cul ser su copago por los medicamentos por adelantado ya que esto es diferente dependiendo de la cobertura de su seguro. Sin embargo, es posible que podamos encontrar un medicamento sustituto a Electrical engineer un formulario para que el seguro cubra el medicamento que se considera necesario.   Si se requiere una autorizacin previa para que su compaa de seguros [place] su medicamento, por favor permtanos de 1 a 2 das hbiles para completar este proceso.  Los precios de los medicamentos varan con frecuencia dependiendo del Environmental consultant de dnde se surte la receta y alguna farmacias pueden ofrecer precios ms baratos.  El sitio web www.goodrx.com tiene cupones para medicamentos de Airline pilot. Los precios aqu no tienen en cuenta lo que  podra costar con la ayuda del seguro (puede ser ms barato con su seguro), pero el sitio web puede darle el precio si no utiliz Research scientist (physical sciences).  - Puede imprimir el cupn correspondiente y llevarlo con su receta a la farmacia.  - Tambin puede pasar por nuestra oficina durante el horario de atencin regular y Charity fundraiser una tarjeta de cupones de GoodRx.  - Si necesita que su receta se enve electrnicamente a una farmacia diferente, informe a nuestra oficina a travs de MyChart de [place] o por telfono llamando al [phone] y presione la opcin 4.

## 2021-05-14 ENCOUNTER — Ambulatory Visit
Admission: RE | Admit: 2021-05-14 | Discharge: 2021-05-14 | Disposition: A | Discharge status: Home / Self Care | Payer: Medicaid Other | Source: Ambulatory Visit | Attending: Nurse Practitioner | Admitting: Nurse Practitioner

## 2021-05-14 ENCOUNTER — Other Ambulatory Visit: Payer: Self-pay

## 2021-05-14 DIAGNOSIS — E063 Autoimmune thyroiditis: Secondary | ICD-10-CM | POA: Insufficient documentation

## 2021-05-14 DIAGNOSIS — E069 Thyroiditis, unspecified: Secondary | ICD-10-CM | POA: Diagnosis not present

## 2021-05-14 NOTE — Progress Notes (Signed)
Contacted via [place] ? ? ?Good afternoon Lekisha, thyroid ultrasound shows no nodules!!  Great news.  Does show a thyroid with known thyroid disease, which we treat you for.  No changes needed:) ?Keep being stellar!!  Thank you for allowing me to participate in your care.  I appreciate you. ?Kindest regards, ?Doris Gruhn

## 2021-05-28 ENCOUNTER — Telehealth: Payer: Self-pay | Admitting: Hematology and Oncology

## 2021-05-28 NOTE — Telephone Encounter (Signed)
Contact patient to reschedule appointment per providers template. Patient said she wanted to just cancel. She will call back to reschedule.  ?

## 2021-05-29 ENCOUNTER — Other Ambulatory Visit: Payer: Self-pay | Admitting: Dermatology

## 2021-05-29 DIAGNOSIS — L659 Nonscarring hair loss, unspecified: Secondary | ICD-10-CM

## 2021-06-03 ENCOUNTER — Ambulatory Visit
Payer: Medicaid Other | Attending: Student in an Organized Health Care Education/Training Program | Admitting: Student in an Organized Health Care Education/Training Program

## 2021-06-03 ENCOUNTER — Other Ambulatory Visit: Payer: Self-pay

## 2021-06-03 ENCOUNTER — Encounter: Payer: Self-pay | Admitting: Student in an Organized Health Care Education/Training Program

## 2021-06-03 VITALS — BP 142/99 | HR 74 | Temp 97.0°F | Resp 16 | Ht 63.0 in | Wt 188.0 lb

## 2021-06-03 DIAGNOSIS — M2550 Pain in unspecified joint: Secondary | ICD-10-CM | POA: Insufficient documentation

## 2021-06-03 DIAGNOSIS — M7918 Myalgia, other site: Secondary | ICD-10-CM | POA: Diagnosis not present

## 2021-06-03 DIAGNOSIS — M25552 Pain in left hip: Secondary | ICD-10-CM | POA: Diagnosis not present

## 2021-06-03 DIAGNOSIS — G8929 Other chronic pain: Secondary | ICD-10-CM | POA: Insufficient documentation

## 2021-06-03 DIAGNOSIS — M25561 Pain in right knee: Secondary | ICD-10-CM | POA: Insufficient documentation

## 2021-06-03 DIAGNOSIS — G894 Chronic pain syndrome: Secondary | ICD-10-CM | POA: Insufficient documentation

## 2021-06-03 DIAGNOSIS — M25551 Pain in right hip: Secondary | ICD-10-CM | POA: Diagnosis not present

## 2021-06-03 DIAGNOSIS — M797 Fibromyalgia: Secondary | ICD-10-CM | POA: Diagnosis not present

## 2021-06-03 DIAGNOSIS — M25562 Pain in left knee: Secondary | ICD-10-CM | POA: Insufficient documentation

## 2021-06-03 NOTE — Progress Notes (Signed)
Patient: Tonya Odonnell  Service Category: E/M  Provider: Gillis Santa, MD  ?DOB: [birth year]  DOS: 06/03/2021  Referring Provider: [name], NP  ?MRN: [id number]  Setting: Ambulatory outpatient  PCP: [name], NP  ?Type: New Patient  Specialty: Interventional Pain Management    ?Location: Office  Delivery: Face-to-face    ? ?Primary Reason(s) for Visit: Encounter for initial evaluation of one or more chronic problems (new to examiner) potentially causing chronic pain, and posing a threat to normal musculoskeletal function. (Level of risk: High) ?CC: Joint Pain (Entire body, Fibromyalgia syndrome) ? ?HPI  ?Tonya Odonnell is a 56 y.o. year old, female patient, who comes for the first time to our practice referred by Marnee Guarneri T, NP for our initial evaluation of her chronic pain. She has History of breast cancer; Adverse reaction to influenza vaccine; Anxiety; Chronic low back pain; Essential (primary) hypertension; Fibromyalgia; Gallstones; Hepatitis B core antibody positive; Hyperlipidemia; Hashimoto's thyroiditis; Gastroesophageal reflux disease without esophagitis; Prediabetes; Osteopenia of lumbar spine; Obesity; Chronic arthralgias of knees and hips; Chronic pain syndrome; and Myalgia, multiple sites on their problem list. Today she comes in for evaluation of her Joint Pain (Entire body, Fibromyalgia syndrome) ? ?Pain Assessment: ?Location:  (all joints in the body)  (entire body) ?Radiating:   ?Onset: More than a month ago ?Duration: Chronic pain ?Quality: Aching, Burning, Constant, Discomfort, Squeezing, Tender ("movement") ?Severity: 8 /10 (subjective, self-reported pain score)  ?Effect on ADL: All ADL's , ?Timing: Constant ?Modifying factors: rest, no movement, better in am, CBD oil ?BP: (!) 142/99  HR: 74 ? ?Onset and Duration: Gradual ?Cause of pain:  Fibromyalygia ?Severity: No change since onset ?Timing: Not influenced by the time of the day ?Aggravating Factors: Kneeling, Lifiting,  Prolonged sitting, Prolonged standing, Walking, Walking uphill, Walking downhill, and Working ?Alleviating Factors: Lying down, Resting, Sitting, and Sleeping ?Associated Problems: Pain that wakes patient up and Pain that does not allow patient to sleep ?Quality of Pain: Annoying, Deep, Dull, Exhausting, Heavy, Horrible, Hot, and Pulsating ?Previous Examinations or Tests: Bone scan, X-rays, and Chiropractic evaluation ?Previous Treatments: Chiropractic manipulations ? ?[name] is a pleasant 56 year old female who presents with diffuse myalgias and polyarthralgias.  She has a diagnosis of fibromyalgia that was made in 2000.  Of note her myalgias and arthralgias started after her swine flu vaccine in 1999.  Subsequently she was diagnosed with breast cancer in 2016.  She recently moved from [place].  She has had a left ovarian removal as well as left mastectomy.  She is also done chemotherapy and radiation.  She has tried nortriptyline, gabapentin, Lyrica without any benefit.  In fact some of these medications also resulted in mood changes and negative cognitive side effects.  She has done physical therapy in the past as well as chiropractic therapy.  She is wondering if there is any additional therapies or techniques to help manage her pain.  She is not on any controlled substances at this time. ? ? ?Historic Controlled Substance Pharmacotherapy Review  ? ?The patient  reports no history of drug use. ?List of all UDS Test(s): ?No results found for: MDMA, COCAINSCRNUR, [place], [place], [place], [place], [place] ?List of other Serum/Urine Drug Screening Test(s):  ?No results found for: AMPHSCRSER, [place], [place], [place], [place], [place], [place], [place], [place], [place], [place], [place], [place], [place] ?Historical Background Evaluation: ?[place] PMP: PDMP reviewed during this encounter. Online review of the past 41-monthperiod conducted.             ? ?[place]  Department of public safety,  offender search: Editor, commissioning Information) Non-contributory ?Risk Assessment Profile: ?Aberrant behavior: None observed or detected today ?Risk factors for fatal opioid overdose: None identified today ?Fatal overdose hazard ratio (HR): Calculation deferred ?Non-fatal overdose hazard ratio (HR): Calculation deferred ?Risk of opioid abuse or dependence: 0.7-3.0% with doses ? 36 MME/day and 6.1-26% with doses ? 120 MME/day. ?Substance use disorder (SUD) risk level: See below ?Personal History of Substance Abuse (SUD-Substance use disorder):  ?Alcohol: Negative  ?Illegal Drugs: Negative (uses CBD oil)  ?Rx Drugs: Negative  ?ORT Risk Level calculation: Low Risk ? Opioid Risk Tool - 06/03/21 1124   ? ?  ? Personal History of Substance Abuse  ? Alcohol Negative   ? Illegal Drugs Negative   uses CBD oil  ? Rx Drugs Negative   ?  ? History of Preadolescent Sexual Abuse  ? History of Preadolescent Sexual Abuse Negative or Female   ?  ? Psychological Disease  ? Psychological Disease Negative   ? Depression Negative   ?  ? Total Score  ? Opioid Risk Tool Scoring 0   ? Opioid Risk Interpretation Low Risk   ? ?  ?  ? ?  ? ?ORT Scoring interpretation table:  ?Score <3 = Low Risk for SUD  ?Score between 4-7 = Moderate Risk for SUD  ?Score >8 = High Risk for Opioid Abuse  ? ?PHQ-2 Depression Scale:  Total score: 0 ? ?PHQ-2 Scoring interpretation table: (Score and probability of major depressive disorder)  ?Score 0 = No depression  ?Score 1 = 15.4% Probability  ?Score 2 = 21.1% Probability  ?Score 3 = 38.4% Probability  ?Score 4 = 45.5% Probability  ?Score 5 = 56.4% Probability  ?Score 6 = 78.6% Probability  ? ?PHQ-9 Depression Scale:  Total score: 0 ? ?PHQ-9 Scoring interpretation table:  ?Score 0-4 = No depression  ?Score 5-9 = Mild depression  ?Score 10-14 = Moderate depression  ?Score 15-19 = Moderately severe depression  ?Score 20-27 = Severe depression (2.4 times higher risk of SUD and 2.89 times higher risk of overuse)   ? ?Pharmacologic Plan: As per protocol, I have not taken over any controlled substance management, pending the results of ordered tests and/or consults.            ?Initial impression: Pending review of available data and ordered tests. ? ?Meds  ? ?Current Outpatient Medications:  ?  calcium-vitamin D (OSCAL WITH D) 500-5 MG-MCG tablet, Take 1 tablet by mouth., Disp: , Rfl:  ?  clobetasol cream (TEMOVATE) 2.68 %, Apply 1 application topically 2 (two) times daily. Apply to affected area, Disp: 30 g, Rfl: 2 ?  cromolyn (OPTICROM) 4 % ophthalmic solution, INSTILL 1 DROP INTO BOTH EYES 4 TIMES A DAY, Disp: , Rfl:  ?  fluticasone (FLONASE) 50 MCG/ACT nasal spray, Place 2 sprays into both nostrils daily., Disp: 16 g, Rfl: 2 ?  ibuprofen (ADVIL) 200 MG tablet, Take 200 mg by mouth every 6 (six) hours as needed., Disp: , Rfl:  ?  Ivermectin (SOOLANTRA) 1 % CREA, Apply a small amount to face every night for rosacea., Disp: 45 g, Rfl: 3 ?  levothyroxine (SYNTHROID) 100 MCG tablet, Take 1 tablet (100 mcg total) by mouth every morning., Disp: 90 tablet, Rfl: 1 ?  metoprolol succinate (TOPROL-XL) 50 MG 24 hr tablet, Take by mouth., Disp: , Rfl:  ?  minoxidil (LONITEN) 2.5 MG tablet, Take 1 tablet (2.5 mg total) by mouth daily., Disp: 30 tablet, Rfl:  1 ?  olopatadine (PATANOL) 0.1 % ophthalmic solution, 1 drop 2 (two) times daily., Disp: , Rfl:  ?  omeprazole (PRILOSEC) 40 MG capsule, Take 1 capsule (40 mg total) by mouth daily., Disp: 30 capsule, Rfl: 11 ?  pyridoxine (B-6) 100 MG tablet, Take 1 tablet (100 mg total) by mouth daily., Disp: 90 tablet, Rfl: 2 ?  valACYclovir (VALTREX) 500 MG tablet, Take 1 tablet (500 mg total) by mouth daily. Can increase to twice a day for 5 days in the event of a recurrence, Disp: 90 tablet, Rfl: 4 ?ROS  ?Cardiovascular: High blood pressure ?Pulmonary or Respiratory: No reported pulmonary signs or symptoms such as wheezing and difficulty taking a deep full breath (Asthma), difficulty blowing  air out (Emphysema), coughing up mucus (Bronchitis), persistent dry cough, or temporary stoppage of breathing during sleep ?Neurological: No reported neurological signs or symptoms such as seizures, abnormal skin sensati

## 2021-06-03 NOTE — Progress Notes (Signed)
Safety precautions to be maintained throughout the outpatient stay will include: orient to surroundings, keep bed in low position, maintain call bell within reach at all times, provide assistance with transfer out of bed and ambulation.  

## 2021-06-07 LAB — COMPLIANCE DRUG ANALYSIS, UR

## 2021-06-13 ENCOUNTER — Encounter: Payer: Medicaid Other | Attending: Nurse Practitioner | Admitting: Dietician

## 2021-06-13 ENCOUNTER — Encounter: Payer: Self-pay | Admitting: Student in an Organized Health Care Education/Training Program

## 2021-06-13 ENCOUNTER — Ambulatory Visit
Payer: Medicaid Other | Attending: Student in an Organized Health Care Education/Training Program | Admitting: Student in an Organized Health Care Education/Training Program

## 2021-06-13 ENCOUNTER — Other Ambulatory Visit: Payer: Self-pay

## 2021-06-13 VITALS — BP 159/99 | HR 62 | Temp 98.2°F | Resp 16 | Ht 63.0 in | Wt 186.0 lb

## 2021-06-13 VITALS — Ht 63.0 in | Wt 186.4 lb

## 2021-06-13 DIAGNOSIS — G894 Chronic pain syndrome: Secondary | ICD-10-CM | POA: Insufficient documentation

## 2021-06-13 DIAGNOSIS — M25551 Pain in right hip: Secondary | ICD-10-CM | POA: Insufficient documentation

## 2021-06-13 DIAGNOSIS — Z853 Personal history of malignant neoplasm of breast: Secondary | ICD-10-CM | POA: Insufficient documentation

## 2021-06-13 DIAGNOSIS — E782 Mixed hyperlipidemia: Secondary | ICD-10-CM | POA: Diagnosis present

## 2021-06-13 DIAGNOSIS — G8929 Other chronic pain: Secondary | ICD-10-CM | POA: Insufficient documentation

## 2021-06-13 DIAGNOSIS — M25552 Pain in left hip: Secondary | ICD-10-CM | POA: Diagnosis not present

## 2021-06-13 DIAGNOSIS — M25561 Pain in right knee: Secondary | ICD-10-CM | POA: Diagnosis not present

## 2021-06-13 DIAGNOSIS — M25562 Pain in left knee: Secondary | ICD-10-CM | POA: Diagnosis not present

## 2021-06-13 DIAGNOSIS — M797 Fibromyalgia: Secondary | ICD-10-CM

## 2021-06-13 DIAGNOSIS — M2550 Pain in unspecified joint: Secondary | ICD-10-CM | POA: Insufficient documentation

## 2021-06-13 DIAGNOSIS — Z0289 Encounter for other administrative examinations: Secondary | ICD-10-CM | POA: Insufficient documentation

## 2021-06-13 DIAGNOSIS — I10 Essential (primary) hypertension: Secondary | ICD-10-CM | POA: Insufficient documentation

## 2021-06-13 DIAGNOSIS — Z713 Dietary counseling and surveillance: Secondary | ICD-10-CM | POA: Diagnosis not present

## 2021-06-13 DIAGNOSIS — E6609 Other obesity due to excess calories: Secondary | ICD-10-CM

## 2021-06-13 DIAGNOSIS — M7918 Myalgia, other site: Secondary | ICD-10-CM | POA: Diagnosis not present

## 2021-06-13 DIAGNOSIS — E063 Autoimmune thyroiditis: Secondary | ICD-10-CM

## 2021-06-13 MED ORDER — TRAMADOL HCL 50 MG PO TABS: 50.0000 mg | ORAL_TABLET | Freq: Two times a day (BID) | ORAL | 0 refills | Status: DC | PRN

## 2021-06-13 MED ORDER — TRAMADOL HCL 50 MG PO TABS: 50.0000 mg | ORAL_TABLET | Freq: Two times a day (BID) | ORAL | 0 refills | Status: AC | PRN

## 2021-06-13 NOTE — Progress Notes (Signed)
Medical Nutrition Therapy: Visit start time: 1050  end time: 1150  ?Assessment:  Diagnosis: hyperlipidemia ?Past medical history: HTN, Hashimoto's disease, fibromyalgia, breast cancer 2016 ?Psychosocial issues/ stress concerns: patient reports high stress level, feels she is not dealing so well with her stress ? ?Preferred learning method:  ?Auditory ?Visual ?Hands-on ? ?Current weight: 186.4lbs Height: 5'3"  BMI: 33.02 ? ?Medications, supplements: reconciled list in medical record ? ?Progress and evaluation:  ?Patient reports significant weight gain after chemotherapy in 2016 for breast cancer ?Recent diagnosis of Hashimoto's syndrome  ?Trying to limit bread intake, but has not lost weight  ?Lost about 10lbs when moving to [place] from [place], due to stress, but has regained that weight ?Move has been very stressful due to multiple mishaps/ complications; now beginning to improve ?Experiencing hot flashes, feeling hot most of the time, which seems to aggravate her pain more. ? ?Physical activity: none due to fibromyalgia pain; takes dogs out; unable to move much at all in afternoons ? ?Dietary Intake:  ?Usual eating pattern includes 2 meals and 0-1 snacks per day. ?Dining out frequency: 0-1 meals per week, about 2x a month ? ?Breakfast: eggs/ omelet, potato, occ chorizo/ sausage; oatmeal; blueberry pancakes on Sundays ?Snack: none ?Lunch: often skips, not hungry -- usually lunch or dinner ?Snack: occasionally cheese or  salami, less often chips ?Supper: fish, salad ?Snack: none or same as pm ?Beverages: hot tea, small amount sugar, occ milk, water ? ?Intervention:  ? ?Nutrition Care Education: ?  ?Basic nutrition: appropriate nutrient balance; general nutrition guidelines    ?Weight control: reasonable weight loss rate slower with factors including hypothyroid disease and limited mobility/ activity; importance of low sugar and low fat choices; portion control; estimated energy needs for weight loss at 1200 kcal,  provided guidance for 45% CHO, 25% pro, 30% fat ?Hyperlipidemia:  target goals for lipids; healthy and unhealthy fats; role of fiber, plant sterols; role of exercise ?Hashimoto's: emphasizing veg and fruits, whole grains, including fish and minimizing processed meats to minimize inflammatory effects in thyroid, heart health, joint pain. ?Advanced nutrition: menu planning; simple balanced meals to minimize cooking ? ?Other:   ?Provided information on resources for local fresh produce. Patient reported having difficulty finding good fresh produce in the area. ?Provided personalized menus based on patient's food preferences, per her request.  ?Discussed importance of flexibility with menus to allow for adequate variety of intake and support long term weight loss success. ?Patient declined MNT follow up at this time; offered future scheduled appt as needed. ? ? ?Nutritional Diagnosis:  [place]-2.2 Altered nutrition-related laboratory As related to hyperlipidemia.  As evidenced by elevated total cholesterol and LDL. ?[place]-3.3 Overweight/obesity As related to hypothyroid disease, limited activity due to fibromyalgia pain.  As evidenced by patient with current BMI of 33, and patient reported symptoms. ? ? ?Education Materials given:  ?General diet guidelines for Cholesterol-lowering/ Heart health ?Foods to Choose to Lower Cholesterol (AHA) ?Know the Fats [facility]) ?Plate Planner with food lists, sample meal pattern ?Sample menus ?Visit summary with goals/ instructions ? ? ?Learner/ who was taught:  ?Patient  ? ?Level of understanding: ?Verbalizes/ demonstrates competency ? ? ?Demonstrated degree of understanding via:   Teach back ?Learning barriers: ?None ? ?Willingness to learn/ readiness for change: ?Eager, change in progress ? ? ?Monitoring and Evaluation:  Dietary intake, exercise, blood lipids, and body weight ?     follow up: prn  ?

## 2021-06-13 NOTE — Patient Instructions (Addendum)
Keep portions of starchy foods small, less than the size of a fisted hand (1/2 - 2/3 cup) ?Plan to eat something at least 3 times a day. ?Include generous portions of vegetables and fruits. ?

## 2021-06-13 NOTE — Progress Notes (Addendum)
PROVIDER NOTE: Information contained herein reflects review and annotations entered in association with encounter. Interpretation of such information and data should be left to medically-trained personnel. Information provided to patient can be located elsewhere in the medical record under "Patient Instructions". Document created using STT-dictation technology, any transcriptional errors that may result from process are unintentional.  ?  ?Patient: Tonya Odonnell  Service Category: E/M  Provider: Gillis Santa, MD  ?DOB: January 10, 1966  DOS: 06/13/2021  Specialty: Interventional Pain Management  ?MRN: [id number]  Setting: Ambulatory outpatient  PCP: [name], NP  ?Type: Established Patient    Referring Provider: [name], NP  ?Location: Office  Delivery: Face-to-face    ? ?Primary Reason(s) for Visit: Encounter for evaluation before starting new chronic pain management plan of care (Level of risk: moderate) ?CC: Fibromyalgia, Back Pain (Lumbar left is worse ), Hip Pain (Bilateral ), and Hand Pain (Left just above the wrist. ) ? ?HPI  ?Tonya Odonnell is a 56 y.o. year old, female patient, who comes today for a follow-up evaluation to review the test results and decide on a treatment plan. She has History of breast cancer; Adverse reaction to influenza vaccine; Anxiety; Chronic low back pain; Essential (primary) hypertension; Fibromyalgia; Gallstones; Hepatitis B core antibody positive; Hyperlipidemia; Hashimoto's thyroiditis; Gastroesophageal reflux disease without esophagitis; Prediabetes; Osteopenia of lumbar spine; Obesity; Chronic arthralgias of knees and hips; Chronic pain syndrome; Myalgia, multiple sites; and Pain management contract signed on their problem list. Her primarily concern today is the Fibromyalgia, Back Pain (Lumbar left is worse ), Hip Pain (Bilateral ), and Hand Pain (Left just above the wrist. ) ? ?Pain Assessment: ?Location: Lower, Left, Right Back (fibromyalgia and hip pain  bilateral) ?Radiating: denies ?Onset: More than a month ago ?Duration:   ?Quality: Discomfort, Constant, Sore, Tightness ?Severity: 8 /10 (subjective, self-reported pain score)  ?Effect on ADL: pain is worse by the end of the day. ?Timing: Constant ?Modifying factors: rest ?BP: (!) 159/99  HR: 62 ? ?Tonya Odonnell comes in today for a follow-up visit after her initial evaluation on 06/03/2021.  ? ?Tonya Odonnell presents today for her second patient visit.  No change in her medical history since our last visit.  We reviewed her urine toxicology screen which was positive for THC.  Patient states that she utilizes CBD gummy at night to help with sleep.  She denies smoking marijuana or ingesting any edibles.  This is consistent with her THC level which is less than 100 which is usually the case when patients are utilizing CBD.  Today we discussed her options for pain management.  We discussed tramadol versus buprenorphine and patient would like to trial tramadol.  I will send in a prescription below and I will follow-up with her in approximately 4 weeks to assess response and determine treatment plan at that time. ? ? ?HPI from initial clinic visit: ?Tonya Odonnell is a pleasant 56 year old female who presents with diffuse myalgias and polyarthralgias.  She has a diagnosis of fibromyalgia that was made in 2000.  Of note her myalgias and arthralgias started after her swine flu vaccine in 1999.  Subsequently she was diagnosed with breast cancer in 2016.  She recently moved from [place].  She has had a left ovarian removal as well as left mastectomy.  She is also done chemotherapy and radiation.  She has tried nortriptyline, gabapentin, Lyrica without any benefit.  In fact some of these medications also resulted in mood changes and negative cognitive side effects.  She has done  physical therapy in the past as well as chiropractic therapy.  She is wondering if there is any additional therapies or techniques to help manage her pain.  She is  not on any controlled substances at this time. ? ? ?Controlled Substance Pharmacotherapy Assessment ?REMS (Risk Evaluation and Mitigation Strategy)  ? ?Pill Count: None expected due to no prior prescriptions written by our practice. ?Tonya Billow, RN  06/13/2021  1:45 PM  Signed ?Safety precautions to be maintained throughout the outpatient Odonnell will include: orient to surroundings, keep bed in low position, maintain call bell within reach at all times, provide assistance with transfer out of bed and ambulation.  ?  ? ?Monitoring: ?[place] PMP: PDMP reviewed during this encounter. Online review of the past 33-monthperiod previously conducted. Not applicable at this point since we have not taken over the patient's medication management yet. ?List of other Serum/Urine Drug Screening Test(s):  ?No results found for: AMPHSCRSER, BD[place]?List of all UDS test(s) done:  ?Lab Results  ?Component Value Date  ? SUMMARY Note 06/03/2021  ? ?Last UDS on record: ?Summary  ?Date Value Ref Range Status  ?06/03/2021 Note  Final  ?  Comment:  ?  ==================================================================== ?Compliance Drug Analysis, Ur ?==================================================================== ?Test                             Result       Flag       Units ? ?Drug Present and Declared for Prescription Verification ?  Metoprolol                     PRESENT      EXPECTED ? ?Drug Present not Declared for Prescription Verification ?  Carboxy-THC                    42           UNEXPECTED ng/mg creat ?   Carboxy-THC is a metabolite of tetrahydrocannabinol (THC). Source of ?   THC is most commonly herbal marijuana or marijuana-based products, ?   but THC is also present in a scheduled prescription medication. ?   Trace amounts of THC can be present in hemp and cannabidiol (CBD) ?   products.  This test is not intended to distinguish between delta-9- ?   tetrahydrocannabinol, the predominant form of THC in most herbal or ?   marijuana-based products, and delta-8-tetrahydrocannabinol. ? ?Drug Absent but Declared for Prescription Verification ?  Ibuprofen                      Not Detected UNEXPECTED ?   Ibuprofen, as indicated in the declared medication list, is not ?   always detected even when used as directed. ? ?==================================================================== ?Test                      Result    Flag   Units      Ref Range ?  Creatinine              124              mg/dL      >=20 ?==================================================================== ?Declared Medications: ? The flagging and interpretation on this report are based on the ? following declared medications.  Unexpected results may arise from ? inaccuracies in the declared  medications. ? ? **Note: The testing scope of this panel includes these medications: ? ? Metoprolol (Toprol) ? ? **Note: The testing scope of this panel does not include small to ? moderate amounts of these reported medications: ? ? Ibuprofen (Advil) ? ? **Note: The testing scope of this panel does not include the ? following reported medications: ? ? Calcium ? Cholecalciferol ? Clobetasol (Temovate) ? Cromolyn ? Fluticasone (Flonase) ? Ivermectin ? Levothyroxine (Synthroid) ? Metronidazole (MetroCream) ? Minoxidil (Loniten) ? Olopatadine (Patanol) ? Omeprazole (Prilosec) ? Valacyclovir (Valtrex) ? Vitamin B6 ?==================================================================== ?For clinical consultation, please call [phone]. ?==================================================================== ?  ? ?UDS interpretation: No unexpected findings.         Utilizes CBD gummy ?Medication Assessment Form: Patient introduced to form today ?Treatment compliance: Treatment may start today if patient agrees with proposed plan. Evaluation of compliance is  not applicable at this point ?Risk Assessment Profile: ?Aberrant behavior: See initial evaluations. None observed or detected today ?Comorbid factors increasing risk of overdose: See initial evaluation. No additional

## 2021-06-13 NOTE — Patient Instructions (Signed)
Sign pain contract.

## 2021-06-13 NOTE — Progress Notes (Signed)
Safety precautions to be maintained throughout the outpatient stay will include: orient to surroundings, keep bed in low position, maintain call bell within reach at all times, provide assistance with transfer out of bed and ambulation.  

## 2021-06-13 NOTE — Addendum Note (Signed)
Addended by: Gillis Santa on: 06/13/2021 01:54 PM ? ? Modules accepted: Orders ? ?

## 2021-06-15 NOTE — Patient Instructions (Addendum)
[facility] =  [phone] ? ?Myofascial Pain Syndrome and Fibromyalgia ?Myofascial pain syndrome and fibromyalgia are both pain disorders. This pain may be felt mainly in your muscles. ?Myofascial pain syndrome: ?Always has tender points in the muscle that will cause pain when pressed (trigger points). The pain may come and go. ?Usually affects your neck, upper back, and shoulder areas. The pain often radiates into your arms and hands. ?Fibromyalgia: ?Has muscle pains and tenderness that come and go. ?Is often associated with fatigue and sleep problems. ?Has trigger points. ?Tends to be long-lasting (chronic), but is not life-threatening. ?Fibromyalgia and myofascial pain syndrome are not the same. However, they often occur together. If you have both conditions, each can make the other worse. Both are common and can cause enough pain and fatigue to make day-to-day activities difficult. Both can be hard to diagnose because their symptoms are common in many other conditions. ?What are the causes? ?The exact causes of these conditions are not known. ?What increases the risk? ?You are more likely to develop this condition if: ?You have a family history of the condition. ?You have certain triggers, such as: ?Spine disorders. ?An injury (trauma) or other physical stressors. ?Being under a lot of stress. ?Medical conditions such as osteoarthritis, rheumatoid arthritis, or lupus. ?What are the signs or symptoms? ?Fibromyalgia ?The main symptom of fibromyalgia is widespread pain and tenderness in your muscles. Pain is sometimes described as stabbing, shooting, or burning. ?You may also have: ?Tingling or numbness. ?Sleep problems and fatigue. ?Problems with attention and concentration (fibro fog). ?Other symptoms may include:  ?Bowel and bladder problems. ?Headaches. ?Visual problems. ?Problems with odors and noises. ?Depression or mood changes. ?Painful menstrual periods (dysmenorrhea). ?Dry skin or  eyes. ?These symptoms can vary over time. ?Myofascial pain syndrome ?Symptoms of myofascial pain syndrome include: ?Tight, ropy bands of muscle. ?Uncomfortable sensations in muscle areas. These may include aching, cramping, burning, numbness, tingling, and weakness. ?Difficulty moving certain parts of the body freely (poor range of motion). ?How is this diagnosed? ?This condition may be diagnosed by your symptoms and medical history. You will also have a physical exam. In general: ?Fibromyalgia is diagnosed if you have pain, fatigue, and other symptoms for more than 3 months, and symptoms cannot be explained by another condition. ?Myofascial pain syndrome is diagnosed if you have trigger points in your muscles, and those trigger points are tender and cause pain elsewhere in your body (referred pain). ?How is this treated? ?Treatment for these conditions depends on the type that you have. ?For fibromyalgia: ?Pain medicines, such as NSAIDs. ?Medicines for treating depression. ?Medicines for treating seizures. ?Medicines that relax the muscles. ?For myofascial pain: ?Pain medicines, such as NSAIDs. ?Cooling and stretching of muscles. ?Trigger point injections. ?Sound wave (ultrasound) treatments to stimulate muscles. ?Treating these conditions often requires a team of health care providers. These may include: ?Your primary care provider. ?Physical therapist. ?Complementary health care providers, such as massage therapists or acupuncturists. ?Psychiatrist for cognitive behavioral therapy. ?Follow these instructions at home: ?Medicines ?Take over-the-counter and prescription medicines only as told by your health care provider. ?Do not drive or use heavy machinery while taking prescription pain medicine. ?If you are taking prescription pain medicine, take actions to prevent or treat constipation. Your health care provider may recommend that you: ?Drink enough fluid to keep your urine pale yellow. ?Eat foods that are high  in fiber, such as fresh fruits and vegetables, whole grains, and beans. ?Limit foods that are high in  fat and processed sugars, such as fried or sweet foods. ?Take an over-the-counter or prescription medicine for constipation. ?Lifestyle ? ?Exercise as directed by your health care provider or physical therapist. ?Practice relaxation techniques to control your stress. You may want to try: ?Biofeedback. ?Visual imagery. ?Hypnosis. ?Muscle relaxation. ?Yoga. ?Meditation. ?Maintain a healthy lifestyle. This includes eating a healthy diet and getting enough sleep. ?Do not use any products that contain nicotine or tobacco, such as cigarettes and e-cigarettes. If you need help quitting, ask your health care provider. ?General instructions ?Talk to your health care provider about complementary treatments, such as acupuncture or massage. ?Consider joining a support group with others who are diagnosed with this condition. ?Do not do activities that stress or strain your muscles. This includes repetitive motions and heavy lifting. ?Keep all follow-up visits as told by your health care provider. This is important. ?Where to find more information ?National Fibromyalgia Association: www.fmaware.org ?Arthritis Foundation: www.arthritis.org ?American Chronic Pain Association: www.theacpa.org ?Contact a health care provider if: ?You have new symptoms. ?Your symptoms get worse or your pain is severe. ?You have side effects from your medicines. ?You have trouble sleeping. ?Your condition is causing depression or anxiety. ?Summary ?Myofascial pain syndrome and fibromyalgia are pain disorders. ?Myofascial pain syndrome has tender points in the muscle that will cause pain when pressed (trigger points). Fibromyalgia also has muscle pains and tenderness that come and go, but this condition is often associated with fatigue and sleep disturbances. ?Fibromyalgia and myofascial pain syndrome are not the same but often occur together, causing  pain and fatigue that make day-to-day activities difficult. ?Treatment for fibromyalgia includes taking medicines to relax the muscles and medicines for pain, depression, or seizures. Treatment for myofascial pain syndrome includes taking medicines for pain, cooling and stretching of muscles, and injecting medicines into trigger points. ?Follow your health care provider's instructions for taking medicines and maintaining a healthy lifestyle. ?This information is not intended to replace advice given to you by your health care provider. Make sure you discuss any questions you have with your health care provider. ?Document Revised: 06/18/2018 Document Reviewed: 03/11/2017 ?Elsevier Patient Education ? [place]. ? ?

## 2021-06-17 ENCOUNTER — Ambulatory Visit (INDEPENDENT_AMBULATORY_CARE_PROVIDER_SITE_OTHER): Payer: Medicaid Other | Admitting: Nurse Practitioner

## 2021-06-17 ENCOUNTER — Encounter: Payer: Self-pay | Admitting: Nurse Practitioner

## 2021-06-17 ENCOUNTER — Ambulatory Visit: Payer: Medicaid Other | Admitting: Hematology and Oncology

## 2021-06-17 DIAGNOSIS — M797 Fibromyalgia: Secondary | ICD-10-CM | POA: Diagnosis not present

## 2021-06-17 NOTE — Assessment & Plan Note (Signed)
Chronic, ongoing.  Followed by pain clinic at this time, Tramadol ordered -- discussed with her today and how to take + when, educated on medication.  She will try this, does not want to use Amitriptyline due to past effects with Nortriptyline.  Follow-up with pain clinic and neurosurgery as scheduled. ?

## 2021-06-17 NOTE — Progress Notes (Signed)
? ?BP 128/83   Pulse 65   Temp 98.7 ?F (37.1 ?C) (Oral)   Ht '5\' 3"'$  (1.6 m)   Wt 187 lb 9.6 oz (85.1 kg)   LMP  (LMP Unknown)   SpO2 97%   BMI 33.23 kg/m?   ? ?Subjective:  ? ? Patient ID: Tonya Odonnell, female    DOB: [birth year], 56 y.o.   MRN: [id number] ? ?HPI: ?Tonya Odonnell is a 56 y.o. female ? ?Chief Complaint  ?Patient presents with  ? Fibromyalgia  ?  Patient is here for a follow up on Fibromyalgia. Patient states she would like to discuss recent referrals and discuss the different providers she has seen. Patient states everything as far as how she feels is about the same.   ? ?ARTHRALGIAS / JOINT ACHES/ FIBROMYALGIA ?Had initial visit with pain clinic on 06/03/21 and recent visit 06/13/21 -- she was prescribed Tramadol -- was ordered for 60 tablets, but pharmacy gave her only 5 tablets.  She has not tried as of yet.  She has seen neurosurgeon [facility]), do not have access to this note in [place] -- oncology referred her to them.  They did imaging she reports, but has not heard from them on this + they reported they are going to refer her to [facility].  She reports they prescribed Amitriptyline, but she has not taken as did not like Nortriptyline. ? ?She is concerned as has underlying bone pain at baseline -- this started after swine flu vaccine -- had underlying nerve pain to full left side.  Was placed on Nortriptyline, which she has now stopped due to no benefit.  Took for 6-9 months prior to her breast surgeries.  Took Lyrica - did not help pain + tried Cymbalta and Gabapentin without benefit.   ?  ?Has pain 24/7 to every muscle and bone, feels like when you have too much exercise everything feels sore daily.  Better in morning after she gets up -- notices it more in afternoon. Needs soft chairs.   ?Duration: months -- chronic tall over ?Pain: yes ?Symmetric: yes, 8/10 ?Quality: dull, aching, and throbbing ?Frequency: constant ?Context:  worse ?Decreased function/range of motion: yes -- decreased  in hand function ?Erythema: none ?Swelling: yes ?Heat or warmth: yes ?Morning stiffness: yes ?Aggravating factors: unknown ?Alleviating factors: only not moving ?Relief with NSAIDs?: moderate ?Treatments attempted:  Ibuprofen and CBD oil  ?Involved Joints: all joints, diffuse ? ?Relevant past medical, surgical, family and social history reviewed and updated as indicated. Interim medical history since our last visit reviewed. ?Allergies and medications reviewed and updated. ? ?Review of Systems  ?Constitutional:  Negative for activity change, appetite change, diaphoresis, fatigue and fever.  ?Respiratory:  Negative for cough, chest tightness and shortness of breath.   ?Cardiovascular:  Negative for chest pain, palpitations and leg swelling.  ?Gastrointestinal: Negative.   ?Endocrine: Negative for cold intolerance, heat intolerance, polydipsia, polyphagia and polyuria.  ?Musculoskeletal:  Positive for arthralgias.  ?Neurological: Negative.   ?Psychiatric/Behavioral: Negative.    ? ?Per HPI unless specifically indicated above ? ?   ?Objective:  ?  ?BP 128/83   Pulse 65   Temp 98.7 ?F (37.1 ?C) (Oral)   Ht '5\' 3"'$  (1.6 m)   Wt 187 lb 9.6 oz (85.1 kg)   LMP  (LMP Unknown)   SpO2 97%   BMI 33.23 kg/m?   ?Wt Readings from Last 3 Encounters:  ?06/17/21 187 lb 9.6 oz (85.1 kg)  ?06/13/21 186 lb (84.4 kg)  ?  06/13/21 186 lb 6.4 oz (84.6 kg)  ?  ?Physical Exam ?Vitals and nursing note reviewed.  ?Constitutional:   ?   General: She is awake. She is not in acute distress. ?   Appearance: She is well-developed and well-groomed. She is obese. She is not ill-appearing or toxic-appearing.  ?HENT:  ?   Head: Normocephalic.  ?   Right Ear: Hearing normal.  ?   Left Ear: Hearing normal.  ?Eyes:  ?   General: Lids are normal.     ?   Right eye: No discharge.     ?   Left eye: No discharge.  ?   Conjunctiva/sclera: Conjunctivae normal.  ?   Pupils: Pupils are equal, round, and reactive to light.  ?Neck:  ?   Thyroid: No thyromegaly.   ?   Vascular: No carotid bruit.  ?Cardiovascular:  ?   Rate and Rhythm: Normal rate and regular rhythm.  ?   Heart sounds: Normal heart sounds. No murmur heard. ?  No gallop.  ?Pulmonary:  ?   Effort: Pulmonary effort is normal. No accessory muscle usage or respiratory distress.  ?   Breath sounds: Normal breath sounds.  ?Abdominal:  ?   General: Bowel sounds are normal.  ?   Palpations: Abdomen is soft. There is no hepatomegaly or splenomegaly.  ?Musculoskeletal:  ?   Cervical back: Normal range of motion and neck supple.  ?   Right lower leg: No edema.  ?   Left lower leg: No edema.  ?Lymphadenopathy:  ?   Cervical: No cervical adenopathy.  ?Skin: ?   General: Skin is warm and dry.  ?Neurological:  ?   Mental Status: She is alert and oriented to person, place, and time.  ?Psychiatric:     ?   Attention and Perception: Attention normal.     ?   Mood and Affect: Mood normal.     ?   Speech: Speech normal.     ?   Behavior: Behavior normal. Behavior is cooperative.     ?   Thought Content: Thought content normal.  ? ?Results for orders placed or performed in visit on 06/03/21  ?Compliance Drug Analysis, Ur  ?Result Value Ref Range  ? Summary Note   ? ?   ?Assessment & Plan:  ? ?Problem List Items Addressed This Visit   ? ?  ? Other  ? Fibromyalgia  ?  Chronic, ongoing.  Followed by pain clinic at this time, Tramadol ordered -- discussed with her today and how to take + when, educated on medication.  She will try this, does not want to use Amitriptyline due to past effects with Nortriptyline.  Follow-up with pain clinic and neurosurgery as scheduled. ?  ?  ?  ? ?Follow up plan: ?Return in about 6 months (around 12/17/2021) for HTN/HLD, THYROID, FIBROMYALGIA, PREDIABETES, H/O BREAST CANCER. ? ? ? ? ? ?

## 2021-06-21 ENCOUNTER — Encounter: Payer: Self-pay | Admitting: Internal Medicine

## 2021-06-24 ENCOUNTER — Ambulatory Visit: Payer: Medicaid Other | Admitting: Dermatology

## 2021-06-24 ENCOUNTER — Telehealth: Payer: Self-pay | Admitting: *Deleted

## 2021-06-24 NOTE — Telephone Encounter (Signed)
Pt. Is direct referral please review chart and advise if she can be as direct or do she need office visit prior to procedure scheduled on 08/12/21 ? ? ?Thank you. ?

## 2021-06-25 NOTE — Telephone Encounter (Signed)
She has never had one. ?

## 2021-06-26 DIAGNOSIS — B029 Zoster without complications: Secondary | ICD-10-CM | POA: Diagnosis not present

## 2021-06-26 DIAGNOSIS — I10 Essential (primary) hypertension: Secondary | ICD-10-CM | POA: Diagnosis not present

## 2021-07-08 ENCOUNTER — Encounter: Payer: Self-pay | Admitting: Nurse Practitioner

## 2021-07-08 MED ORDER — VALACYCLOVIR HCL 1 G PO TABS
1000.0000 mg | ORAL_TABLET | Freq: Every day | ORAL | 0 refills | Status: DC
Start: 2021-07-08 — End: 2021-11-22

## 2021-07-11 ENCOUNTER — Encounter: Payer: Medicaid Other | Admitting: Student in an Organized Health Care Education/Training Program

## 2021-07-15 ENCOUNTER — Ambulatory Visit (AMBULATORY_SURGERY_CENTER): Payer: Medicaid Other | Admitting: *Deleted

## 2021-07-15 VITALS — Ht 63.0 in | Wt 185.0 lb

## 2021-07-15 DIAGNOSIS — Z1211 Encounter for screening for malignant neoplasm of colon: Secondary | ICD-10-CM

## 2021-07-15 MED ORDER — NA SULFATE-K SULFATE-MG SULF 17.5-3.13-1.6 GM/177ML PO SOLN: 1.0000 | Freq: Once | ORAL | 0 refills | Status: AC

## 2021-07-15 NOTE — Progress Notes (Signed)

## 2021-07-29 DIAGNOSIS — Z1231 Encounter for screening mammogram for malignant neoplasm of breast: Secondary | ICD-10-CM | POA: Diagnosis not present

## 2021-08-08 ENCOUNTER — Encounter: Payer: Self-pay | Admitting: Nurse Practitioner

## 2021-08-09 MED ORDER — LEVOTHYROXINE SODIUM 100 MCG PO TABS: 100.0000 ug | ORAL_TABLET | Freq: Every morning | ORAL | 4 refills | Status: DC

## 2021-08-12 ENCOUNTER — Encounter: Payer: Self-pay | Admitting: Internal Medicine

## 2021-08-12 ENCOUNTER — Ambulatory Visit (AMBULATORY_SURGERY_CENTER): Payer: Medicaid Other | Admitting: Internal Medicine

## 2021-08-12 VITALS — BP 140/83 | HR 55 | Temp 98.4°F | Resp 19 | Ht 63.0 in | Wt 185.0 lb

## 2021-08-12 DIAGNOSIS — I10 Essential (primary) hypertension: Secondary | ICD-10-CM | POA: Diagnosis not present

## 2021-08-12 DIAGNOSIS — K5140 Inflammatory polyps of colon without complications: Secondary | ICD-10-CM | POA: Diagnosis not present

## 2021-08-12 DIAGNOSIS — D123 Benign neoplasm of transverse colon: Secondary | ICD-10-CM

## 2021-08-12 DIAGNOSIS — Z1211 Encounter for screening for malignant neoplasm of colon: Secondary | ICD-10-CM | POA: Diagnosis not present

## 2021-08-12 DIAGNOSIS — E785 Hyperlipidemia, unspecified: Secondary | ICD-10-CM | POA: Diagnosis not present

## 2021-08-12 DIAGNOSIS — I493 Ventricular premature depolarization: Secondary | ICD-10-CM | POA: Diagnosis not present

## 2021-08-12 MED ORDER — SODIUM CHLORIDE 0.9 % IV SOLN: 500.0000 mL | Freq: Once | INTRAVENOUS | Status: DC

## 2021-08-12 NOTE — Progress Notes (Signed)
Pt's states no medical or surgical changes since previsit or office visit. 

## 2021-08-12 NOTE — Progress Notes (Signed)
Pt non-responsive, VVS, Report to RN  °

## 2021-08-12 NOTE — Progress Notes (Signed)
Called to room to assist during endoscopic procedure.  Patient ID and intended procedure confirmed with present staff. Received instructions for my participation in the procedure from the performing physician.  

## 2021-08-12 NOTE — Op Note (Signed)
[facility] Patient Name: Tonya Odonnell Procedure Date: 08/12/2021 12:08 PM MRN: [id number] Endoscopist: Sonny Masters "Tonya Odonnell ,  Age: 56 Referring MD:  Date of Birth: 03-29-1965 Gender: Female Account #: [id number] Procedure:                Colonoscopy Indications:              Screening for colorectal malignant neoplasm, This                            is the patient's first colonoscopy Medicines:                Monitored Anesthesia Care Procedure:                Pre-Anesthesia Assessment:                           - Prior to the procedure, a History and Physical                            was performed, and patient medications and                            allergies were reviewed. The patient's tolerance of                            previous anesthesia was also reviewed. The risks                            and benefits of the procedure and the sedation                            options and risks were discussed with the patient.                            All questions were answered, and informed consent                            was obtained. Prior Anticoagulants: The patient has                            taken no previous anticoagulant or antiplatelet                            agents. ASA Grade Assessment: II - A patient with                            mild systemic disease. After reviewing the risks                            and benefits, the patient was deemed in                            satisfactory condition to undergo the procedure.  After obtaining informed consent, the colonoscope                            was passed under direct vision. Throughout the                            procedure, the patient's blood pressure, pulse, and                            oxygen saturations were monitored continuously. The                            Olympus CF-HQ190L (Serial# [id number]) Colonoscope was                            introduced through the  anus and advanced to the the                            terminal ileum. The colonoscopy was performed                            without difficulty. The patient tolerated the                            procedure well. The quality of the bowel                            preparation was good. The terminal ileum, ileocecal                            valve, appendiceal orifice, and rectum were                            photographed. Scope In: 12:09:44 PM Scope Out: 12:34:27 PM Scope Withdrawal Time: 0 hours 22 minutes 32 seconds  Total Procedure Duration: 0 hours 24 minutes 43 seconds  Findings:                 The terminal ileum appeared normal.                           A 15 mm polyp was found in the transverse colon.                            The polyp was pedunculated. The polyp was removed                            with a hot snare. Resection and retrieval were                            complete. Area distal and on the opposite wall was                            tattooed with an injection of 0.4 mL of Spot                            (  carbon black).                           A 8 mm polyp was found in the transverse colon. The                            polyp was sessile. The polyp was removed with a hot                            snare. Resection and retrieval were complete.                           A 4 mm polyp was found in the transverse colon. The                            polyp was sessile. The polyp was removed with a                            cold snare. Resection and retrieval were complete.                           Multiple small and large-mouthed diverticula were                            found in the sigmoid colon.                           Non-bleeding internal hemorrhoids were found during                            retroflexion. Complications:            No immediate complications. Estimated Blood Loss:     Estimated blood loss was minimal. Impression:                - The examined portion of the ileum was normal.                           - One 15 mm polyp in the transverse colon, removed                            with a hot snare. Resected and retrieved. Tattooed.                           - One 8 mm polyp in the transverse colon, removed                            with a hot snare. Resected and retrieved.                           - One 4 mm polyp in the transverse colon, removed                            with a cold snare.  Resected and retrieved.                           - Diverticulosis in the sigmoid colon.                           - Non-bleeding internal hemorrhoids. Recommendation:           - Discharge patient to home (with escort).                           - Await pathology results.                           - The findings and recommendations were discussed                            with the patient. Sonny Masters "Tonya Odonnell,  08/12/2021 12:43:58 PM

## 2021-08-12 NOTE — Patient Instructions (Signed)
Handouts given for polyps and diverticulosis.  Await pathology results.  Resume previous diet and current medications.   YOU HAD AN ENDOSCOPIC PROCEDURE TODAY AT [facility]:   Refer to the procedure report that was given to you for any specific questions about what was found during the examination.  If the procedure report does not answer your questions, please call your gastroenterologist to clarify.  If you requested that your care partner not be given the details of your procedure findings, then the procedure report has been included in a sealed envelope for you to review at your convenience later.  YOU SHOULD EXPECT: Some feelings of bloating in the abdomen. Passage of more gas than usual.  Walking can help get rid of the air that was put into your GI tract during the procedure and reduce the bloating. If you had a lower endoscopy (such as a colonoscopy or flexible sigmoidoscopy) you may notice spotting of blood in your stool or on the toilet paper. If you underwent a bowel prep for your procedure, you may not have a normal bowel movement for a few days.  Please Note:  You might notice some irritation and congestion in your nose or some drainage.  This is from the oxygen used during your procedure.  There is no need for concern and it should clear up in a day or so.  SYMPTOMS TO REPORT IMMEDIATELY:  Following lower endoscopy (colonoscopy or flexible sigmoidoscopy):  Excessive amounts of blood in the stool  Significant tenderness or worsening of abdominal pains  Swelling of the abdomen that is new, acute  Fever of 100F or higher  For urgent or emergent issues, a gastroenterologist can be reached at any hour by calling [phone]. Do not use MyChart messaging for urgent concerns.    DIET:  We do recommend a small meal at first, but then you may proceed to your regular diet.  Drink plenty of fluids but you should avoid alcoholic beverages for 24 hours.  ACTIVITY:  You  should plan to take it easy for the rest of today and you should NOT DRIVE or use heavy machinery until tomorrow (because of the sedation medicines used during the test).    FOLLOW UP: Our staff will call the number listed on your records 24-72 hours following your procedure to check on you and address any questions or concerns that you may have regarding the information given to you following your procedure. If we do not reach you, we will leave a message.  We will attempt to reach you two times.  During this call, we will ask if you have developed any symptoms of COVID 19. If you develop any symptoms (ie: fever, flu-like symptoms, shortness of breath, cough etc.) before then, please call [phone].  If you test positive for Covid 19 in the 2 weeks post procedure, please call and report this information to [place].    If any biopsies were taken you will be contacted by phone or by letter within the next 1-3 weeks.  Please call us at [phone] if you have not heard about the biopsies in 3 weeks.    SIGNATURES/CONFIDENTIALITY: You and/or your care partner have signed paperwork which will be entered into your electronic medical record.  These signatures attest to the fact that that the information above on your After Visit Summary has been reviewed and is understood.  Full responsibility of the confidentiality of this discharge information lies with you and/or your care-partner.

## 2021-08-12 NOTE — Progress Notes (Signed)
GASTROENTEROLOGY PROCEDURE H&P NOTE   Primary Care Physician: [name], NP    Reason for Procedure:   Colon cancer screening  Plan:    Colonoscopy  Patient is appropriate for endoscopic procedure(s) in the ambulatory ([facility]) setting.  The nature of the procedure, as well as the risks, benefits, and alternatives were carefully and thoroughly reviewed with the patient. Ample time for discussion and questions allowed. The patient understood, was satisfied, and agreed to proceed.     HPI: Tonya Odonnell is a 56 y.o. female who presents for colonoscopy for colon cancer screening. Denies blood in stools, changes in bowel habits, weight loss. Denies fam hx of colon cancer.  Past Medical History:  Diagnosis Date   Anemia    "USE TO HAVE IT,NOT NOW,2015,2016   Arthritis    HIPS,KNEES,HANDS   Breast cancer in female ([place])    Cancer ([place])    LEFT   Hyperlipidemia    Hypertension    Neuromuscular disorder ([place])    MUSCLE PAIN,FIBROMYALGIA   Osteopenia    PVC (premature ventricular contraction)    Thyroid disease     Past Surgical History:  Procedure Laterality Date   Left Ovarian removal Left    MASTECTOMY Left    "LYMPHNODES REMOVED    Prior to Admission medications   Medication Sig Start Date End Date Taking? Authorizing Provider  Cholecalciferol 25 MCG (1000 UT) tablet Take by mouth. 12/13/20  Yes [provider]  clobetasol cream (TEMOVATE) 0.35 % Apply 1 application topically 2 (two) times daily. Apply to affected area 01/09/21  Yes Caren Macadam, MD  cromolyn (OPTICROM) 4 % ophthalmic solution INSTILL 1 DROP INTO BOTH EYES 4 TIMES A DAY 04/01/21  Yes [provider]  diclofenac Sodium (VOLTAREN) 1 % GEL Apply topically as needed.   Yes [provider]  Ivermectin (SOOLANTRA) 1 % CREA Apply a small amount to face every night for rosacea. 03/06/21  Yes Brendolyn Patty, MD  levothyroxine (SYNTHROID) 100 MCG tablet Take 1 tablet (100  mcg total) by mouth every morning. 08/09/21  Yes Cannady, Jolene T, NP  metoprolol succinate (TOPROL-XL) 50 MG 24 hr tablet Take by mouth in the morning and at bedtime. 11/06/20  Yes [provider]  olopatadine (PATANOL) 0.1 % ophthalmic solution 1 drop 2 (two) times daily. 04/01/21  Yes [provider]  [place] CBD OIL TAKE ONE DROP BEFORE GO TO BED TO HELP WITH SLEEP   Yes [provider]  pyridoxine (B-6) 100 MG tablet Take 1 tablet (100 mg total) by mouth daily. 12/20/20 12/20/21 Yes Vigg, Avanti, MD  calcium-vitamin D (OSCAL WITH D) 500-5 MG-MCG tablet Take 1 tablet by mouth. Patient not taking: Reported on 07/15/2021    [provider]  fluticasone (FLONASE) 50 MCG/ACT nasal spray Place 2 sprays into both nostrils daily. Patient taking differently: Place 2 sprays into both nostrils as needed. 12/13/20 12/13/21  Vigg, Avanti, MD  ibuprofen (ADVIL) 200 MG tablet Take 200 mg by mouth every 6 (six) hours as needed.    [provider]  minoxidil (LONITEN) 2.5 MG tablet Take 1 tablet (2.5 mg total) by mouth daily. Patient not taking: Reported on 08/12/2021 05/07/21   Brendolyn Patty, MD  omeprazole (PRILOSEC) 40 MG capsule Take 1 capsule (40 mg total) by mouth daily. Patient taking differently: Take 40 mg by mouth as needed. 12/13/20 12/14/21  Vigg, Avanti, MD  valACYclovir (VALTREX) 1000 MG tablet Take 1 tablet (1,000 mg  total) by mouth daily. Patient taking differently: Take 1,000 mg by mouth as needed. 07/08/21   [name], NP    Current Outpatient Medications  Medication Sig Dispense Refill   Cholecalciferol 25 MCG (1000 UT) tablet Take by mouth.     clobetasol cream (TEMOVATE) 5.36 % Apply 1 application topically 2 (two) times daily. Apply to affected area 30 g 2   cromolyn (OPTICROM) 4 % ophthalmic solution INSTILL 1 DROP INTO BOTH EYES 4 TIMES A DAY     diclofenac Sodium (VOLTAREN) 1 % GEL Apply topically as needed.      Ivermectin (SOOLANTRA) 1 % CREA Apply a small amount to face every night for rosacea. 45 g 3   levothyroxine (SYNTHROID) 100 MCG tablet Take 1 tablet (100 mcg total) by mouth every morning. 90 tablet 4   metoprolol succinate (TOPROL-XL) 50 MG 24 hr tablet Take by mouth in the morning and at bedtime.     olopatadine (PATANOL) 0.1 % ophthalmic solution 1 drop 2 (two) times daily.     OVER THE COUNTER MEDICATION CBD OIL TAKE ONE DROP BEFORE GO TO BED TO HELP WITH SLEEP     pyridoxine (B-6) 100 MG tablet Take 1 tablet (100 mg total) by mouth daily. 90 tablet 2   calcium-vitamin D (OSCAL WITH D) 500-5 MG-MCG tablet Take 1 tablet by mouth. (Patient not taking: Reported on 07/15/2021)     fluticasone (FLONASE) 50 MCG/ACT nasal spray Place 2 sprays into both nostrils daily. (Patient taking differently: Place 2 sprays into both nostrils as needed.) 16 g 2   ibuprofen (ADVIL) 200 MG tablet Take 200 mg by mouth every 6 (six) hours as needed.     minoxidil (LONITEN) 2.5 MG tablet Take 1 tablet (2.5 mg total) by mouth daily. (Patient not taking: Reported on 08/12/2021) 30 tablet 1   omeprazole (PRILOSEC) 40 MG capsule Take 1 capsule (40 mg total) by mouth daily. (Patient taking differently: Take 40 mg by mouth as needed.) 30 capsule 11   valACYclovir (VALTREX) 1000 MG tablet Take 1 tablet (1,000 mg total) by mouth daily. (Patient taking differently: Take 1,000 mg by mouth as needed.) 90 tablet 0   Current Facility-Administered Medications  Medication Dose Route Frequency Provider Last Rate Last Admin   0.9 %  sodium chloride infusion  500 mL Intravenous Once Sharyn Creamer, MD        Allergies as of 08/12/2021 - Review Complete 08/12/2021  Allergen Reaction Noted   Lisinopril Other (See Comments) 11/27/2013    Family History  Problem Relation Age of Onset   Hypertension Mother    Stomach cancer Father    Colon cancer Neg Hx    Colon polyps Neg Hx    Crohn's disease Neg Hx    Esophageal cancer Neg Hx     Rectal cancer Neg Hx     Social History   Socioeconomic History   Marital status: Married    Spouse name: Not on file   Number of children: Not on file   Years of education: Not on file   Highest education level: Not on file  Occupational History   Not on file  Tobacco Use   Smoking status: Never   Smokeless tobacco: Never  Vaping Use   Vaping Use: Never used  Substance and Sexual Activity   Alcohol use: Never   Drug use: Never   Sexual activity: Not Currently  Other Topics Concern   Not on file  Social History Narrative  Not on file   Social Determinants of Health   Financial Resource Strain: Not on file  Food Insecurity: Not on file  Transportation Needs: Not on file  Physical Activity: Not on file  Stress: Not on file  Social Connections: Not on file  Intimate Partner Violence: Not on file    Physical Exam: Vital signs in last 24 hours: BP 140/89   Pulse (!) 58   Temp 98.4 F (36.9 C)   Ht '5\' 3"'$  (1.6 m)   Wt 185 lb (83.9 kg)   LMP  (LMP Unknown)   SpO2 98%   BMI 32.77 kg/m  GEN: NAD EYE: Sclerae anicteric ENT: MMM CV: Non-tachycardic Pulm: No increased work of breathing GI: Soft, NT/ND NEURO:  Alert & Oriented   [name], MD [facility]  08/12/2021 11:00 AM

## 2021-08-13 ENCOUNTER — Telehealth: Payer: Self-pay

## 2021-08-13 NOTE — Telephone Encounter (Signed)
  Follow up Call-     08/12/2021   10:42 AM  Call back number  Post procedure Call Back phone  # [phone]  Permission to leave phone message Yes     Patient questions:  Do you have a fever, pain , or abdominal swelling? No. Pain Score  0 *  Have you tolerated food without any problems? Yes.    Have you been able to return to your normal activities? Yes.    Do you have any questions about your discharge instructions: Diet   No. Medications  No. Follow up visit  No.  Do you have questions or concerns about your Care? No.  Actions: * If pain score is 4 or above: No action needed, pain <4.

## 2021-08-14 DIAGNOSIS — Z9012 Acquired absence of left breast and nipple: Secondary | ICD-10-CM | POA: Diagnosis not present

## 2021-08-14 DIAGNOSIS — R928 Other abnormal and inconclusive findings on diagnostic imaging of breast: Secondary | ICD-10-CM | POA: Diagnosis not present

## 2021-08-14 DIAGNOSIS — R922 Inconclusive mammogram: Secondary | ICD-10-CM | POA: Diagnosis not present

## 2021-08-15 ENCOUNTER — Encounter: Payer: Self-pay | Admitting: Internal Medicine

## 2021-08-21 ENCOUNTER — Inpatient Hospital Stay: Payer: Medicaid Other | Attending: Adult Health | Admitting: Adult Health

## 2021-08-21 ENCOUNTER — Other Ambulatory Visit: Payer: Self-pay

## 2021-08-21 ENCOUNTER — Ambulatory Visit (HOSPITAL_COMMUNITY)
Admission: RE | Admit: 2021-08-21 | Discharge: 2021-08-21 | Disposition: A | Discharge status: Home / Self Care | Payer: Medicaid Other | Source: Ambulatory Visit | Attending: Adult Health | Admitting: Adult Health

## 2021-08-21 ENCOUNTER — Inpatient Hospital Stay: Payer: Medicaid Other

## 2021-08-21 ENCOUNTER — Encounter: Payer: Self-pay | Admitting: Adult Health

## 2021-08-21 ENCOUNTER — Telehealth: Payer: Self-pay | Admitting: Adult Health

## 2021-08-21 VITALS — BP 135/87 | HR 63 | Temp 97.5°F | Resp 18 | Ht 63.0 in | Wt 184.3 lb

## 2021-08-21 DIAGNOSIS — Z79899 Other long term (current) drug therapy: Secondary | ICD-10-CM | POA: Diagnosis not present

## 2021-08-21 DIAGNOSIS — Z853 Personal history of malignant neoplasm of breast: Secondary | ICD-10-CM | POA: Insufficient documentation

## 2021-08-21 DIAGNOSIS — R1031 Right lower quadrant pain: Secondary | ICD-10-CM | POA: Diagnosis not present

## 2021-08-21 DIAGNOSIS — Z9012 Acquired absence of left breast and nipple: Secondary | ICD-10-CM | POA: Insufficient documentation

## 2021-08-21 DIAGNOSIS — M25511 Pain in right shoulder: Secondary | ICD-10-CM | POA: Diagnosis not present

## 2021-08-21 DIAGNOSIS — Z79811 Long term (current) use of aromatase inhibitors: Secondary | ICD-10-CM | POA: Diagnosis not present

## 2021-08-21 DIAGNOSIS — C50912 Malignant neoplasm of unspecified site of left female breast: Secondary | ICD-10-CM | POA: Insufficient documentation

## 2021-08-21 DIAGNOSIS — Z170 Estrogen receptor positive status [ER+]: Secondary | ICD-10-CM | POA: Insufficient documentation

## 2021-08-21 DIAGNOSIS — Z9221 Personal history of antineoplastic chemotherapy: Secondary | ICD-10-CM | POA: Insufficient documentation

## 2021-08-21 LAB — CBC WITH DIFFERENTIAL (CANCER CENTER ONLY)
Abs Immature Granulocytes: 0.03 10*3/uL (ref 0.00–0.07)
Basophils Absolute: 0.1 10*3/uL (ref 0.0–0.1)
Basophils Relative: 1 %
Eosinophils Absolute: 0.1 10*3/uL (ref 0.0–0.5)
Eosinophils Relative: 2 %
HCT: 45.4 % (ref 36.0–46.0)
Hemoglobin: 15.3 g/dL — ABNORMAL HIGH (ref 12.0–15.0)
Immature Granulocytes: 1 %
Lymphocytes Relative: 37 %
Lymphs Abs: 2.4 10*3/uL (ref 0.7–4.0)
MCH: 29 pg (ref 26.0–34.0)
MCHC: 33.7 g/dL (ref 30.0–36.0)
MCV: 86.1 fL (ref 80.0–100.0)
Monocytes Absolute: 0.4 10*3/uL (ref 0.1–1.0)
Monocytes Relative: 6 %
Neutro Abs: 3.5 10*3/uL (ref 1.7–7.7)
Neutrophils Relative %: 53 %
Platelet Count: 321 10*3/uL (ref 150–400)
RBC: 5.27 MIL/uL — ABNORMAL HIGH (ref 3.87–5.11)
RDW: 14 % (ref 11.5–15.5)
WBC Count: 6.4 10*3/uL (ref 4.0–10.5)
nRBC: 0 % (ref 0.0–0.2)

## 2021-08-21 LAB — CMP (CANCER CENTER ONLY)
ALT: 24 U/L (ref 0–44)
AST: 20 U/L (ref 15–41)
Albumin: 4.4 g/dL (ref 3.5–5.0)
Alkaline Phosphatase: 79 U/L (ref 38–126)
Anion gap: 6 (ref 5–15)
BUN: 19 mg/dL (ref 6–20)
CO2: 27 mmol/L (ref 22–32)
Calcium: 9.6 mg/dL (ref 8.9–10.3)
Chloride: 107 mmol/L (ref 98–111)
Creatinine: 0.75 mg/dL (ref 0.44–1.00)
GFR, Estimated: >60 mL/min (ref >60)
Glucose, Bld: 101 mg/dL — ABNORMAL HIGH (ref 70–99)
Potassium: 3.7 mmol/L (ref 3.5–5.1)
Sodium: 140 mmol/L (ref 135–145)
Total Bilirubin: 1.2 mg/dL (ref 0.3–1.2)
Total Protein: 7.5 g/dL (ref 6.5–8.1)

## 2021-08-21 NOTE — Progress Notes (Signed)
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, DCIS low-grade, lymphovascular invasion present, 1/2 lymph nodes positive ER positive greater than 95%, PR positive greater than 95%, HER2 equivocal 2+ by IHC and FISH was negative (Dr.Lo)   01/17/2015 Surgery   Left mastectomy (done because of close margins): Residual LCIS, 1/5 lymph nodes positive (total 2/10 lymph nodes positive)   01/17/2015 Oncotype testing   Oncotype DX score: 20   2016 - 2017 Chemotherapy   Adjuvant chemotherapy (patient is not sure of what drugs she received but that she had 8 doses)   07/28/2015 -  Anti-estrogen oral therapy   Patient was on and off antiestrogen therapy since May 2017.  Unable to tolerate tamoxifen or Zoladex.  Eventually after she became menopausal, started on Arimidex January 2021 (takes Cymbalta for hot flashes)     CURRENT THERAPY: observation  INTERVAL HISTORY: Tonya Odonnell 56 y.o. female returns for follow-up of her history of breast cancer.  Her most recent breast cancer screening was completed on August 14, 2021 of the right breast and it was negative for malignancy after diagnostic callback.  Since that time she has experienced right shoulder pain and wants that evaluated.  She also has some right lower quadrant pain, and wonders if it could be her appendix that is inflamed.     Patient Active Problem List   Diagnosis Date Noted   Pain management contract signed 06/13/2021   Chronic pain syndrome 06/03/2021   Myalgia,  multiple sites 06/03/2021   Chronic arthralgias of knees and hips 05/06/2021   Osteopenia of lumbar spine 05/05/2021   Obesity 05/05/2021   Anxiety 12/05/2020   Gallstones 12/05/2020   Gastroesophageal reflux disease without esophagitis 12/05/2020   Prediabetes 12/05/2020   Hyperlipidemia 02/08/2018   Hepatitis B core antibody positive 08/07/2017   History of breast cancer 12/13/2014   Chronic low back pain 02/17/2014   Fibromyalgia 10/26/2013   Adverse reaction to influenza vaccine 11/22/2012   Essential (primary) hypertension 05/05/2012   Hashimoto's thyroiditis 05/05/2012    is allergic to lisinopril.  MEDICAL HISTORY: Past Medical History:  Diagnosis Date   Anemia    "USE TO HAVE IT,NOT NOW,2015,2016   Arthritis    HIPS,KNEES,HANDS   Breast cancer in female ([place])    Cancer ([place])    LEFT   Hyperlipidemia    Hypertension    Neuromuscular disorder ([place])    MUSCLE PAIN,FIBROMYALGIA   Osteopenia    PVC (premature ventricular contraction)    Thyroid disease     SURGICAL HISTORY: Past Surgical History:  Procedure Laterality Date   Left Ovarian removal Left    MASTECTOMY Left    "LYMPHNODES REMOVED    SOCIAL HISTORY: Social History   Socioeconomic History   Marital status: Married    Spouse name: Not on file   Number of children: Not on file   Years of education: Not on file   Highest education level: Not on file  Occupational History  Not on file  Tobacco Use   Smoking status: Never   Smokeless tobacco: Never  Vaping Use   Vaping Use: Never used  Substance and Sexual Activity   Alcohol use: Never   Drug use: Never   Sexual activity: Not Currently  Other Topics Concern   Not on file  Social History Narrative   Not on file   Social Determinants of Health   Financial Resource Strain: Not on file  Food Insecurity: Not on file  Transportation Needs: Not on file  Physical Activity: Not on file  Stress: Not on file  Social Connections: Not on file   Intimate Partner Violence: Not on file    FAMILY HISTORY: Family History  Problem Relation Age of Onset   Hypertension Mother    Stomach cancer Father    Colon cancer Neg Hx    Colon polyps Neg Hx    Crohn's disease Neg Hx    Esophageal cancer Neg Hx    Rectal cancer Neg Hx     Review of Systems  Constitutional:  Positive for fatigue. Negative for appetite change, chills, fever and unexpected weight change.  HENT:   Negative for hearing loss, lump/mass and trouble swallowing.   Eyes:  Negative for eye problems and icterus.  Respiratory:  Negative for chest tightness, cough and shortness of breath.   Cardiovascular:  Negative for chest pain, leg swelling and palpitations.  Gastrointestinal:  Negative for abdominal distention, abdominal pain, constipation, diarrhea, nausea and vomiting.  Endocrine: Negative for hot flashes.  Genitourinary:  Negative for difficulty urinating.   Musculoskeletal:  Positive for arthralgias, back pain and myalgias.  Skin:  Negative for itching and rash.  Neurological:  Negative for dizziness, extremity weakness, headaches and numbness.  Hematological:  Negative for adenopathy. Does not bruise/bleed easily.  Psychiatric/Behavioral:  Negative for depression. The patient is not nervous/anxious.       PHYSICAL EXAMINATION  ECOG PERFORMANCE STATUS: 1 - Symptomatic but completely ambulatory  Vitals:   08/21/21 0819  BP: 135/87  Pulse: 63  Resp: 18  Temp: (!) 97.5 F (36.4 C)  SpO2: 97%    Physical Exam Constitutional:      General: She is not in acute distress.    Appearance: Normal appearance. She is not toxic-appearing.  HENT:     Head: Normocephalic and atraumatic.  Eyes:     General: No scleral icterus. Cardiovascular:     Rate and Rhythm: Normal rate and regular rhythm.     Pulses: Normal pulses.     Heart sounds: Normal heart sounds.  Pulmonary:     Effort: Pulmonary effort is normal.     Breath sounds: Normal breath sounds.   Chest:     Comments: Right breast s/p reduction, no sign of local recurrence, left breast s/p mastectomy and reconstruction, no sgin of local recurrence.  Abdominal:     General: Abdomen is flat. Bowel sounds are normal. There is no distension.     Palpations: Abdomen is soft.     Tenderness: There is no abdominal tenderness.  Musculoskeletal:        General: No swelling.     Cervical back: Neck supple.  Lymphadenopathy:     Cervical: No cervical adenopathy.  Skin:    General: Skin is warm and dry.     Findings: No rash.  Neurological:     General: No focal deficit present.     Mental Status: She is alert.  Psychiatric:  Mood and Affect: Mood normal.        Behavior: Behavior normal.     LABORATORY DATA:  CBC    Component Value Date/Time   WBC 7.5 12/05/2020 1014   RBC 5.53 (H) 12/05/2020 1014   HGB 15.5 12/05/2020 1014   HCT 49.2 (H) 12/05/2020 1014   PLT 373 12/05/2020 1014   MCV 89 12/05/2020 1014   MCH 28.0 12/05/2020 1014   MCHC 31.5 12/05/2020 1014   RDW 13.5 12/05/2020 1014   LYMPHSABS 2.9 12/05/2020 1014   EOSABS 0.1 12/05/2020 1014   BASOSABS 0.1 12/05/2020 1014    CMP     Component Value Date/Time   NA 140 05/06/2021 1207   K 4.1 05/06/2021 1207   CL 104 05/06/2021 1207   CO2 21 05/06/2021 1207   GLUCOSE 82 05/06/2021 1207   BUN 18 05/06/2021 1207   CREATININE 0.65 05/06/2021 1207   CALCIUM 9.8 05/06/2021 1207   PROT 7.3 05/06/2021 1207   ALBUMIN 4.7 05/06/2021 1207   AST 28 05/06/2021 1207   ALT 36 (H) 05/06/2021 1207   ALKPHOS 109 05/06/2021 1207   BILITOT 1.0 05/06/2021 1207     ASSESSMENT and THERAPY PLAN:   History of breast cancer And is here for follow-up of her left-sided breast cancer that was diagnosed in June 2016 treated with mastectomy, adjuvant chemotherapy, and antiestrogen therapy that she took until January 2021.  Tonya Odonnell is very nervous about recurrence.  She does have some symptoms that require monitoring.  I  explained to her that we do not see our patients who are this far out with breast cancer every 3 months, however we will see them about every 6 months to every year.  I do not palpate any abnormalities in her right breast that make me concerned about breast cancer recurrence.  Considering that and her imaging that was negative I do not think that is going on here.  I recommended that we get an x-ray of the shoulder and evaluate her abdominal discomfort with CT scan to look at the gallbladder and the appendix.  Sometimes you can have referred pain with cholecystitis.  I reviewed the above with Tonya Odonnell in detail and she is in agreement with the plan.  We will add on labs today and then she will return next week for imaging and we will see her back in 6 months.  She had other concerns about a referral to [facility] from [place] [facility] and I asked my nurse to follow-up on this.  She also had questions about having had shingles in the past and I recommended she follow-up with her primary care to discuss.   All questions were answered. The patient knows to call the clinic with any problems, questions or concerns. We can certainly see the patient much sooner if necessary.  Total encounter time:45 minutes*in face-to-face visit time, chart review, lab review, care coordination, order entry, and documentation of the encounter time.  Wilber Bihari, NP 08/21/21 9:03 AM Medical Oncology and Hematology [facility] [place], [place] [address] Tel. [phone]    Fax. [phone]  *Total Encounter Time as defined by the Centers for Medicare and Medicaid Services includes, in addition to the face-to-face time of a patient visit (documented in the note above) non-face-to-face time: obtaining and reviewing outside history, ordering and reviewing medications, tests or procedures, care coordination (communications with other health care professionals or caregivers) and documentation in  the medical record.

## 2021-08-21 NOTE — Telephone Encounter (Signed)
Scheduled appointment per 6/14 los. Patient is aware.

## 2021-08-21 NOTE — Assessment & Plan Note (Signed)
And is here for follow-up of her left-sided breast cancer that was diagnosed in June 2016 treated with mastectomy, adjuvant chemotherapy, and antiestrogen therapy that she took until January 2021.  Tonya Odonnell is very nervous about recurrence.  She does have some symptoms that require monitoring.  I explained to her that we do not see our patients who are this far out with breast cancer every 3 months, however we will see them about every 6 months to every year.  I do not palpate any abnormalities in her right breast that make me concerned about breast cancer recurrence.  Considering that and her imaging that was negative I do not think that is going on here.  I recommended that we get an x-ray of the shoulder and evaluate her abdominal discomfort with CT scan to look at the gallbladder and the appendix.  Sometimes you can have referred pain with cholecystitis.  I reviewed the above with Tonya Odonnell in detail and she is in agreement with the plan.  We will add on labs today and then she will return next week for imaging and we will see her back in 6 months.  She had other concerns about a referral to [facility] from [place] [facility] and I asked my nurse to follow-up on this.  She also had questions about having had shingles in the past and I recommended she follow-up with her primary care to discuss.

## 2021-08-26 ENCOUNTER — Telehealth: Payer: Self-pay | Admitting: *Deleted

## 2021-08-26 NOTE — Telephone Encounter (Signed)
Error

## 2021-08-30 ENCOUNTER — Ambulatory Visit (HOSPITAL_COMMUNITY): Payer: Medicaid Other

## 2021-09-02 ENCOUNTER — Ambulatory Visit: Payer: Medicaid Other | Admitting: Dermatology

## 2021-09-02 ENCOUNTER — Other Ambulatory Visit: Payer: Self-pay

## 2021-09-02 VITALS — BP 195/107

## 2021-09-02 DIAGNOSIS — L719 Rosacea, unspecified: Secondary | ICD-10-CM

## 2021-09-02 DIAGNOSIS — L659 Nonscarring hair loss, unspecified: Secondary | ICD-10-CM | POA: Diagnosis not present

## 2021-09-02 DIAGNOSIS — H0011 Chalazion right upper eyelid: Secondary | ICD-10-CM | POA: Diagnosis not present

## 2021-09-02 DIAGNOSIS — L219 Seborrheic dermatitis, unspecified: Secondary | ICD-10-CM

## 2021-09-02 MED ORDER — MINOXIDIL 2.5 MG PO TABS: 2.5000 mg | ORAL_TABLET | Freq: Every day | ORAL | 0 refills | Status: DC

## 2021-09-02 MED ORDER — DOXYCYCLINE MONOHYDRATE 100 MG PO CAPS: 100.0000 mg | ORAL_CAPSULE | Freq: Every day | ORAL | 1 refills | Status: DC

## 2021-09-02 MED ORDER — PIMECROLIMUS 1 % EX CREA: TOPICAL_CREAM | CUTANEOUS | 3 refills | Status: DC

## 2021-09-02 MED ORDER — IVERMECTIN 1 % EX CREA: TOPICAL_CREAM | CUTANEOUS | 3 refills | Status: DC

## 2021-09-02 MED ORDER — DOXYCYCLINE MONOHYDRATE 100 MG PO TABS: 100.0000 mg | ORAL_TABLET | Freq: Every day | ORAL | 1 refills | Status: DC

## 2021-09-02 NOTE — Progress Notes (Signed)
Doxycycline 100mg  capsules sent in due to patient's insurance. aw

## 2021-09-04 ENCOUNTER — Other Ambulatory Visit: Payer: Self-pay

## 2021-09-04 DIAGNOSIS — L719 Rosacea, unspecified: Secondary | ICD-10-CM

## 2021-09-04 MED ORDER — IVERMECTIN 1 % EX CREA: 1.0000 | TOPICAL_CREAM | Freq: Every day | CUTANEOUS | 2 refills | Status: DC

## 2021-09-04 MED ORDER — DOXYCYCLINE HYCLATE 100 MG PO TABS: ORAL_TABLET | ORAL | 1 refills | Status: DC

## 2021-09-04 NOTE — Telephone Encounter (Signed)
Pt called she was suppose to get Doxycycline tablets but when she got home the pharmacy dispensed capsules, ok called in Doxycycline 100 mg  tablet #30 1 RF and refilled Soolantra cream

## 2021-09-06 ENCOUNTER — Ambulatory Visit (HOSPITAL_COMMUNITY)
Admission: RE | Admit: 2021-09-06 | Discharge: 2021-09-06 | Disposition: A | Discharge status: Home / Self Care | Payer: Medicaid Other | Source: Ambulatory Visit | Attending: Adult Health | Admitting: Adult Health

## 2021-09-06 DIAGNOSIS — R1031 Right lower quadrant pain: Secondary | ICD-10-CM | POA: Insufficient documentation

## 2021-09-06 DIAGNOSIS — R109 Unspecified abdominal pain: Secondary | ICD-10-CM | POA: Diagnosis not present

## 2021-09-06 DIAGNOSIS — Z853 Personal history of malignant neoplasm of breast: Secondary | ICD-10-CM | POA: Insufficient documentation

## 2021-09-06 MED ORDER — IOHEXOL 300 MG/ML  SOLN
100.0000 mL | Freq: Once | INTRAMUSCULAR | Status: AC | PRN
  Administered 2021-09-06: 100 mL via INTRAVENOUS

## 2021-09-06 MED ORDER — SODIUM CHLORIDE (PF) 0.9 % IJ SOLN
INTRAMUSCULAR | Status: AC
  Filled 2021-09-06: qty 50

## 2021-09-12 ENCOUNTER — Telehealth: Payer: Self-pay

## 2021-09-12 ENCOUNTER — Encounter: Payer: Self-pay | Admitting: Adult Health

## 2021-09-12 NOTE — Telephone Encounter (Signed)
Called pt in regards to [employer].  Pt is asking about abnormalities in her lab work, specifically glucose and creatinine.  I explained to her that these looked normal but she insisted something else must be wrong.  Pt requests to speak with provider regarding such.  Scheduling message sent to setup telephone visit follow up with NP to review results.

## 2021-09-13 ENCOUNTER — Telehealth: Payer: Self-pay | Admitting: Adult Health

## 2021-09-13 NOTE — Telephone Encounter (Signed)
.  Called patient to schedule appointment per 7/6 INB, patient is aware of date and time.

## 2021-09-16 ENCOUNTER — Telehealth: Payer: Self-pay

## 2021-09-16 NOTE — Telephone Encounter (Signed)
-----   Message from Gardenia Phlegm, NP sent at 09/16/2021  8:49 AM EDT ----- Can you schedule a time for me to talk to this patient sometime this week?  Nothing to worry about on her scans, but I feel like I need to explain this likely benign finding.   ----- Message ----- From: Interface, Rad Results In Sent: 09/08/2021   3:13 PM EDT To: Gardenia Phlegm, NP

## 2021-09-16 NOTE — Telephone Encounter (Signed)
Attempted to call pt to offer appt with NP. No answer; lvm for call back.

## 2021-09-17 ENCOUNTER — Inpatient Hospital Stay: Payer: Medicaid Other | Attending: Adult Health | Admitting: Adult Health

## 2021-09-17 ENCOUNTER — Other Ambulatory Visit: Payer: Self-pay | Admitting: *Deleted

## 2021-09-17 ENCOUNTER — Telehealth: Payer: Self-pay

## 2021-09-17 ENCOUNTER — Telehealth: Payer: Self-pay | Admitting: Student in an Organized Health Care Education/Training Program

## 2021-09-17 ENCOUNTER — Encounter: Payer: Self-pay | Admitting: Nurse Practitioner

## 2021-09-17 DIAGNOSIS — Z853 Personal history of malignant neoplasm of breast: Secondary | ICD-10-CM | POA: Diagnosis not present

## 2021-09-17 DIAGNOSIS — D3502 Benign neoplasm of left adrenal gland: Secondary | ICD-10-CM | POA: Insufficient documentation

## 2021-09-17 NOTE — Telephone Encounter (Signed)
Called number that was provided and left message for Mickel Baas to return the call to the pain clinic.

## 2021-09-17 NOTE — Assessment & Plan Note (Signed)
And is here today for follow-up of her history of breast cancer and to discuss her recent imaging.  This imaging showed a benign adrenal adenoma that was 10 mm in size.    I reviewed with Tonya Odonnell that this scan shows no sign of breast cancer recurrence or metastases anywhere in her body which is good news.  Due to the adrenal adenoma I sent her scans to her primary care provider in case she wants to do any other lab testing or evaluation.  In a continues to struggle with her discomfort and pain and notes that she was supposed to have been referred to [facility] by Dr. Zollie Scale and never heard back.  I let her know that my nurse would call Dr. Precious Gilding office.  We will see Tonya Odonnell back in December 2023 for continued follow-up and surveillance of her history of breast cancer.

## 2021-09-17 NOTE — Telephone Encounter (Signed)
Mickel Baas from cancer center called in on behave of pt. Patient was told by doctor that he will be sending an referral over to [facility]. Please give Mickel Baas a call at [phone]

## 2021-09-17 NOTE — Telephone Encounter (Signed)
Called pt in regards to visit with [facility] NP with concern for not hearing from Mds office at pain clinic. Pt originally states she was not f/u from care at Dr Elwyn Lade office, then realized she made a mistake and she is having difficulty seeing [place] [facility]. Pt states she saw Dr Richard Miu there and was prescribed an antidepressant, which she can not remember the name of, and was never given a f/u appt or referral to [facility] as discussed. Pt states she had to abruptly stop antidepressant as she had no f/u visit and feels the office is "ghosting" her. This nurse spoke with pt for total of 30 mins and validated concerns. [facility] and s/w Dr Lucianne Lei secretary who states she will have his nurse call me today after clinic is over. Provided my direct line for call back. Pt is aware and knows I will call her back with information regarding this.

## 2021-09-17 NOTE — Telephone Encounter (Signed)
S/w Sabrina at [place] [facility] who states no f/u visit was scheduled and she sees no documentation regarding referral to [facility]. Gabriel Cirri states she will call pt to schedule f/u visit with dr Richard Miu. Pt is aware and verbalized thanks.

## 2021-09-17 NOTE — Progress Notes (Signed)
[facility] Follow up:    Tonya Lick, NP [place] [address]  I connected with Tonya Odonnell on 09/17/21 at  9:45 AM EDT by telephone and verified that I am speaking with the correct person using two identifiers.  I discussed the limitations, risks, security and privacy concerns of performing an evaluation and management service by telephone and the availability of in person appointments.  I also discussed with the patient that there may be a patient responsible charge related to this service. The patient expressed understanding and agreed to proceed.   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, DCIS low-grade, lymphovascular invasion present, 1/2 lymph nodes positive ER positive greater than 95%, PR positive greater than 95%, HER2 equivocal 2+ by IHC and FISH was negative (Dr.Lo)   01/17/2015 Surgery   Left mastectomy (done because of close margins): Residual LCIS, 1/5 lymph nodes positive (total 2/10 lymph nodes positive)   01/17/2015 Oncotype testing   Oncotype DX score: 20   2016 - 2017 Chemotherapy   Adjuvant chemotherapy (patient is not sure of what drugs she received but that she had 8 doses)   07/28/2015 -  Anti-estrogen oral therapy   Patient was on and off antiestrogen therapy since May 2017.  Unable to tolerate tamoxifen or Zoladex.  Eventually after she became menopausal, started on Arimidex January 2021 (takes Cymbalta for hot flashes)     CURRENT THERAPY: observation  INTERVAL HISTORY: Tonya Odonnell 56 y.o. female returns for  follow up of her recent CT scan findings on 09/08/2021.  She notes pain on her left side.  The scan showed a 59m left adrenal gland nodule, likely benign adenoma.     Patient Active Problem List   Diagnosis Date Noted   Pain management contract signed 06/13/2021   Chronic pain syndrome 06/03/2021   Myalgia, multiple sites 06/03/2021   Chronic arthralgias of knees and hips 05/06/2021   Osteopenia of lumbar spine 05/05/2021   Obesity 05/05/2021   Anxiety 12/05/2020   Gallstones 12/05/2020   Gastroesophageal reflux disease without esophagitis 12/05/2020   Prediabetes 12/05/2020   Hyperlipidemia 02/08/2018   Hepatitis B core antibody positive 08/07/2017   History of breast cancer 12/13/2014   Chronic low back pain 02/17/2014   Fibromyalgia 10/26/2013   Adverse reaction to influenza vaccine 11/22/2012   Essential (primary) hypertension 05/05/2012   Hashimoto's thyroiditis 05/05/2012    is allergic to lisinopril.  MEDICAL HISTORY: Past Medical History:  Diagnosis Date   Anemia    "USE TO HAVE IT,NOT NOW,2015,2016   Arthritis    HIPS,KNEES,HANDS   [place]    [place]    LEFT   Hyperlipidemia    Hypertension    Neuromuscular disorder ([place]    MUSCLE PAIN,FIBROMYALGIA   Osteopenia    PVC (premature ventricular contraction)    Thyroid disease     SURGICAL HISTORY: Past Surgical History:  Procedure Laterality Date   Left Ovarian removal Left    MASTECTOMY Left    "LYMPHNODES REMOVED  SOCIAL HISTORY: Social History   Socioeconomic History   Marital status: Married    Spouse name: Not on file   Number of children: Not on file   Years of education: Not on file   Highest education level: Not on file  Occupational History   Not on file  Tobacco Use   Smoking status: Never   Smokeless tobacco: Never  Vaping Use   Vaping Use: Never used  Substance and Sexual Activity   Alcohol use: Never   Drug use: Never   Sexual activity: Not  Currently  Other Topics Concern   Not on file  Social History Narrative   Not on file   Social Determinants of Health   Financial Resource Strain: Not on file  Food Insecurity: Not on file  Transportation Needs: Not on file  Physical Activity: Not on file  Stress: Not on file  Social Connections: Not on file  Intimate Partner Violence: Not on file    FAMILY HISTORY: Family History  Problem Relation Age of Onset   Hypertension Mother    Stomach cancer Father    Colon cancer Neg Hx    Colon polyps Neg Hx    Crohn's disease Neg Hx    Esophageal cancer Neg Hx    Rectal cancer Neg Hx     Review of Systems  Constitutional:  Negative for appetite change, chills, fatigue, fever and unexpected weight change.  HENT:   Negative for hearing loss, lump/mass and trouble swallowing.   Eyes:  Negative for eye problems and icterus.  Respiratory:  Negative for chest tightness, cough and shortness of breath.   Cardiovascular:  Negative for chest pain, leg swelling and palpitations.  Gastrointestinal:  Negative for abdominal distention, abdominal pain, constipation, diarrhea, nausea and vomiting.  Endocrine: Negative for hot flashes.  Genitourinary:  Negative for difficulty urinating.   Musculoskeletal:  Negative for arthralgias.  Skin:  Negative for itching and rash.  Neurological:  Negative for dizziness, extremity weakness, headaches and numbness.  Hematological:  Negative for adenopathy. Does not bruise/bleed easily.  Psychiatric/Behavioral:  Negative for depression. The patient is not nervous/anxious.       PHYSICAL EXAMINATION Patient sounds well, in no apparent distress, mood and behavior are normal.   LABORATORY DATA:  CBC    Component Value Date/Time   WBC 6.4 08/21/2021 0906   RBC 5.27 (H) 08/21/2021 0906   HGB 15.3 (H) 08/21/2021 0906   HGB 15.5 12/05/2020 1014   HCT 45.4 08/21/2021 0906   HCT 49.2 (H) 12/05/2020 1014   PLT 321 08/21/2021 0906   PLT 373 12/05/2020  1014   MCV 86.1 08/21/2021 0906   MCV 89 12/05/2020 1014   MCH 29.0 08/21/2021 0906   MCHC 33.7 08/21/2021 0906   RDW 14.0 08/21/2021 0906   RDW 13.5 12/05/2020 1014   LYMPHSABS 2.4 08/21/2021 0906   LYMPHSABS 2.9 12/05/2020 1014   MONOABS 0.4 08/21/2021 0906   EOSABS 0.1 08/21/2021 0906   EOSABS 0.1 12/05/2020 1014   BASOSABS 0.1 08/21/2021 0906   BASOSABS 0.1 12/05/2020 1014    CMP     Component Value Date/Time   NA 140 08/21/2021 0906   NA 140 05/06/2021 1207   K 3.7 08/21/2021 0906   CL 107 08/21/2021 0906   CO2 27 08/21/2021 0906   GLUCOSE 101 (H) 08/21/2021 0906   BUN 19 08/21/2021 0906   BUN 18 05/06/2021 1207   CREATININE 0.75 08/21/2021 0906   CALCIUM 9.6 08/21/2021 0906  PROT 7.5 08/21/2021 0906   PROT 7.3 05/06/2021 1207   ALBUMIN 4.4 08/21/2021 0906   ALBUMIN 4.7 05/06/2021 1207   AST 20 08/21/2021 0906   ALT 24 08/21/2021 0906   ALKPHOS 79 08/21/2021 0906   BILITOT 1.2 08/21/2021 0906   GFRNONAA >60 08/21/2021 0906      ASSESSMENT and THERAPY PLAN:   History of breast cancer And is here today for follow-up of her history of breast cancer and to discuss her recent imaging.  This imaging showed a benign adrenal adenoma that was 10 mm in size.    I reviewed with Tonya Odonnell that this scan shows no sign of breast cancer recurrence or metastases anywhere in her body which is good news.  Due to the adrenal adenoma I sent her scans to her primary care provider in case she wants to do any other lab testing or evaluation.  In a continues to struggle with her discomfort and pain and notes that she was [facility] to have [facility] by Dr. Zollie Scale and never heard back.  I let her know that my nurse would call Dr. Precious Gilding office.  We will see Tonya Odonnell back in December 2023 for continued follow-up and surveillance of her history of breast cancer.  Follow up instructions:    -Return to cancer center 02/2022 as scheduled   The patient was provided an opportunity to  ask questions and all were answered. The patient agreed with the plan and demonstrated an understanding of the instructions.   The patient was advised to call back or seek an in-person evaluation if the symptoms worsen or if the condition fails to improve as anticipated.   I provided 21 minutes of non face-to-face telephone visit time during this encounter, and > 50% was spent counseling as documented under my assessment & plan.  Wilber Bihari, NP 09/17/21 10:07 AM Medical Oncology and Hematology [facility] [place], [place] [address] Tel. [phone]    Fax. [phone]

## 2021-10-08 ENCOUNTER — Encounter: Payer: Self-pay | Admitting: Internal Medicine

## 2021-10-08 ENCOUNTER — Ambulatory Visit (INDEPENDENT_AMBULATORY_CARE_PROVIDER_SITE_OTHER): Payer: Medicaid Other | Admitting: Internal Medicine

## 2021-10-08 VITALS — BP 148/94 | HR 115 | Ht 63.0 in | Wt 186.1 lb

## 2021-10-08 DIAGNOSIS — Z86010 Personal history of colonic polyps: Secondary | ICD-10-CM

## 2021-10-08 NOTE — Progress Notes (Signed)
Chief Complaint: Back burning  HPI : 56 year old female with history of breast cancer, fibromyalgia, hypothyroidism presents with back burning  In 1999 she had the swine flu vaccine and she had a severe reaction where she had a burning in her left arm. This pain spread through to the back and has burned constantly for years. She had a reaction to the vaccine where she had swelling in her left breast as well as entire left side of her body. She was subsequently diagnosed with breast cancer after a prolonged work up. She has fibroids on her uterus. After she had her recent colonoscopy where she had a polyp removed, she had an exacerbation of the back burning that she has felt for a long time. This burning has gotten better after time and is now back to her baseline level. She has felt nauseous after her procedure, which has also resolved. She has had one BM per day on average. Denies blood in stools. She has some mild RLQ ab pain, which is chronic in nature and unchanged over years. Father had stomach cancer.  Wt Readings from Last 3 Encounters:  10/08/21 186 lb 2 oz (84.4 kg)  08/21/21 184 lb 4.8 oz (83.6 kg)  08/12/21 185 lb (83.9 kg)   Past Medical History:  Diagnosis Date   Anemia    "USE TO HAVE IT,NOT NOW,2015,2016   Arthritis    HIPS,KNEES,HANDS   Breast cancer in female ([place])    Cancer ([place])    LEFT   Hyperlipidemia    Hypertension    Neuromuscular disorder ([place])    MUSCLE PAIN,FIBROMYALGIA   Osteopenia    PVC (premature ventricular contraction)    Thyroid disease    Past Surgical History:  Procedure Laterality Date   Left Ovarian removal Left    MASTECTOMY Left    "LYMPHNODES REMOVED   Family History  Problem Relation Age of Onset   Hypertension Mother    Stomach cancer Father    Colon cancer Neg Hx    Colon polyps Neg Hx    Crohn's disease Neg Hx    Esophageal cancer Neg Hx    Rectal cancer Neg Hx    Social History   Tobacco Use   Smoking status: Never    Smokeless tobacco: Never  Vaping Use   Vaping Use: Never used  Substance Use Topics   Alcohol use: Never   Drug use: Never   Current Outpatient Medications  Medication Sig Dispense Refill   calcium-vitamin D (OSCAL WITH D) 500-5 MG-MCG tablet Take 1 tablet by mouth. (Patient not taking: Reported on 07/15/2021)     Cholecalciferol 25 MCG (1000 UT) tablet Take by mouth. (Patient not taking: Reported on 08/21/2021)     clobetasol cream (TEMOVATE) 5.36 % Apply 1 application topically 2 (two) times daily. Apply to affected area 30 g 2   cromolyn (OPTICROM) 4 % ophthalmic solution INSTILL 1 DROP INTO BOTH EYES 4 TIMES A DAY     diclofenac Sodium (VOLTAREN) 1 % GEL Apply topically as needed.     doxycycline (MONODOX) 100 MG capsule Take 1 capsule (100 mg total) by mouth daily. 30 capsule 1   doxycycline (VIBRA-TABS) 100 MG tablet Take 1/2 tablet daily 30 tablet 1   fluticasone (FLONASE) 50 MCG/ACT nasal spray Place 2 sprays into both nostrils daily. (Patient taking differently: Place 2 sprays into both nostrils as needed.) 16 g 2   ibuprofen (ADVIL) 200 MG tablet Take 200 mg by mouth every  6 (six) hours as needed.     Ivermectin (SOOLANTRA) 1 % CREA Apply a small amount to face every night for rosacea. 45 g 3   Ivermectin (SOOLANTRA) 1 % CREA Apply 1 Application topically at bedtime. 45 g 2   levothyroxine (SYNTHROID) 100 MCG tablet Take 1 tablet (100 mcg total) by mouth every morning. 90 tablet 4   metoprolol succinate (TOPROL-XL) 50 MG 24 hr tablet Take by mouth in the morning and at bedtime.     minoxidil (LONITEN) 2.5 MG tablet Take 1 tablet (2.5 mg total) by mouth daily. 90 tablet 0   olopatadine (PATANOL) 0.1 % ophthalmic solution 1 drop 2 (two) times daily.     omeprazole (PRILOSEC) 40 MG capsule Take 1 capsule (40 mg total) by mouth daily. (Patient taking differently: Take 40 mg by mouth as needed.) 30 capsule 11   OVER THE COUNTER MEDICATION CBD OIL TAKE ONE DROP BEFORE GO TO BED TO HELP WITH  SLEEP     pimecrolimus (ELIDEL) 1 % cream Apply to red scaly areas on the face BID 30 g 3   pyridoxine (B-6) 100 MG tablet Take 1 tablet (100 mg total) by mouth daily. 90 tablet 2   valACYclovir (VALTREX) 1000 MG tablet Take 1 tablet (1,000 mg total) by mouth daily. (Patient taking differently: Take 1,000 mg by mouth as needed.) 90 tablet 0   No current facility-administered medications for this visit.   Allergies  Allergen Reactions   Lisinopril Other (See Comments)    cough cough    Review of Systems: All systems reviewed and negative except where noted in HPI.   Physical Exam: BP (!) 148/94   Pulse (!) 115   Ht '5\' 3"'$  (1.6 m)   Wt 186 lb 2 oz (84.4 kg)   LMP  (LMP Unknown)   SpO2 96%   BMI 32.97 kg/m  Constitutional: Pleasant,well-developed, female in no acute distress. HEENT: Normocephalic and atraumatic. Conjunctivae are normal. No scleral icterus. Cardiovascular: Normal rate, regular rhythm.  Pulmonary/chest: Effort normal and breath sounds normal. No wheezing, rales or rhonchi. Abdominal: Soft, nondistended, nontender. Bowel sounds active throughout. There are no masses palpable. No hepatomegaly. Extremities: No edema Neurological: Alert and oriented to person place and time. Skin: Skin is warm and dry. No rashes noted. Psychiatric: Normal mood and affect. Behavior is normal.  Labs 08/2021: CBC and CMP unremarkable.  CT A/P w/contrast 09/06/21: IMPRESSION: 1. No acute abdominal/pelvic findings, mass lesions or adenopathy. 2. 10 mm left adrenal gland nodule, likely a benign adenoma. Follow-up noncontrast abdominal CT scan in 6 months is suggested. 3. Small exophytic uterine fibroids. 4. 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  Colonoscopy 08/12/21: - The examined portion of the ileum was normal. - One 15 mm polyp in the transverse colon, removed with a hot snare. Resected and retrieved. Tattooed. - One 8 mm polyp in the transverse colon, removed with a hot snare.  Resected and retrieved. - One 4 mm polyp in the transverse colon, removed with a cold snare. Resected and retrieved. - Diverticulosis in the sigmoid colon. - Non-bleeding internal hemorrhoids. Path: Surgical [P], colon, transverse x2 HAIR SHAFT, transverse x1 CS, polyp (3) FINDINGS CONSISTENT WITH TUBULAR ADENOMA AND INFLAMMATORY POLYP (LARGER ONE). NO HIGH-GRADE DYSPLASIA OR MALIGNANCY IS SEEN. CLINICAL AND ENDOSCOPIC CORRELATION IS REQUIRED  ASSESSMENT AND PLAN: History of colon polyps Patient presents to discuss some lower back pain that has been present for the last 24 years. This pain seemed to worsen  slightly after her colonoscopy but has now returned to her baseline. Patient inquired about alternative ways to look for colon cancer to make sure nothing was missed. She recently had a CT A/P done that showed no obvious mass lesions. I told the patient that if she were to develop new symptoms such as changes in bowel habits, blood in the stools, weight loss, or new onset ab pain, she can reach out to determine if additional work up is needed. I would recommend proceeding with standard colon polyp surveillance at this time. - Will plan for repeat colonoscopy in 08/2024 for polyp surveillance - RTC PRN  [name], MD  I spent 41 minutes of time, including in depth chart review, independent review of results as outlined above, communicating results with the patient directly, face-to-face time with the patient, coordinating care, ordering studies and medications as appropriate, and documentation.

## 2021-10-08 NOTE — Patient Instructions (Addendum)
If you are age 56 or younger, your body mass index should be between 19-25. Your Body mass index is 32.97 kg/m. If this is out of the aformentioned range listed, please consider follow up with your Primary Care Provider.  ________________________________________________________  The [facility] providers would like to encourage you to use [facility] to communicate with providers for non-urgent requests or questions.  Due to long hold times on the telephone, sending your provider a message by [facility] may be a faster and more efficient way to get a response.  Please allow 48 business hours for a response.  Please remember that this is for non-urgent requests.  _______________________________________________________  Please contact our office if you have any new or worsening GI (gastrointestinal) symptoms.  You will follow up in our office on an as needed basis.  Thank you for entrusting me with your care and choosing [facility].  Dr Lorenso Courier

## 2021-11-06 DIAGNOSIS — Z853 Personal history of malignant neoplasm of breast: Secondary | ICD-10-CM | POA: Diagnosis not present

## 2021-11-06 DIAGNOSIS — G729 Myopathy, unspecified: Secondary | ICD-10-CM | POA: Diagnosis not present

## 2021-11-19 NOTE — Patient Instructions (Signed)
Myofascial Pain Syndrome and Fibromyalgia ?Myofascial pain syndrome and fibromyalgia are both pain disorders. You may feel this pain mainly in your muscles. ?Myofascial pain syndrome: ?Always has tender points in the muscles that will cause pain when pressed (trigger points). The pain may come and go. ?Usually affects your neck, upper back, and shoulder areas. The pain often moves into your arms and hands. ?Fibromyalgia: ?Has muscle pains and tenderness that come and go. ?Is often associated with tiredness (fatigue) and sleep problems. ?Has trigger points. ?Tends to be long-lasting (chronic), but is not life-threatening. ?Fibromyalgia and myofascial pain syndrome are not the same. However, they often occur together. If you have both conditions, each can make the other worse. Both are common and can cause enough pain and fatigue to make day-to-day activities difficult. Both can be hard to diagnose because their symptoms are common in many other conditions. ?What are the causes? ?The exact causes of these conditions are not known. ?What increases the risk? ?You are more likely to develop either of these conditions if: ?You have a family history of the condition. ?You are female. ?You have certain triggers, such as: ?Spine disorders. ?An injury (trauma) or other physical stressors. ?Being under a lot of stress. ?Medical conditions such as osteoarthritis, rheumatoid arthritis, or lupus. ?What are the signs or symptoms? ?Fibromyalgia ?The main symptom of fibromyalgia is widespread pain and tenderness in your muscles. Pain is sometimes described as stabbing, shooting, or burning. ?You may also have: ?Tingling or numbness. ?Sleep problems and fatigue. ?Problems with attention and concentration (fibro fog). ?Other symptoms may include: ?Bowel and bladder problems. ?Headaches. ?Vision problems. ?Sensitivity to odors and noises. ?Depression or mood changes. ?Painful menstrual periods (dysmenorrhea). ?Dry skin or eyes. ?These  symptoms can vary over time. ?Myofascial pain syndrome ?Symptoms of myofascial pain syndrome include: ?Tight, ropy bands of muscle. ?Uncomfortable sensations in muscle areas. These may include aching, cramping, burning, numbness, tingling, and weakness. ?Difficulty moving certain parts of the body freely (poor range of motion). ?How is this diagnosed? ?This condition may be diagnosed by your symptoms and medical history. You will also have a physical exam. In general: ?Fibromyalgia is diagnosed if you have pain, fatigue, and other symptoms for more than 3 months, and symptoms cannot be explained by another condition. ?Myofascial pain syndrome is diagnosed if you have trigger points in your muscles, and those trigger points are tender and cause pain elsewhere in your body (referred pain). ?How is this treated? ?Treatment for these conditions depends on the type that you have. ?For fibromyalgia a healthy lifestyle is the most important treatment including aerobic and strength exercises. Different types of medicines are used to help treat pain and include: ?NSAIDs. ?Medicines for treating depression. ?Medicines that help control seizures. ?Medicines that relax the muscles. ?Treatment for myofascial pain syndrome includes: ?Pain medicines, such as NSAIDs. ?Cooling and stretching of muscles. ?Massage therapy with myofascial release technique. ?Trigger point injections. ?Treating these conditions often requires a team of health care providers. These may include: ?Your primary care provider. ?A physical therapist. ?Complementary health care providers, such as massage therapists or acupuncturists. ?A psychiatrist for cognitive behavioral therapy. ?Follow these instructions at home: ?Medicines ?Take over-the-counter and prescription medicines only as told by your health care provider. ?Ask your health care provider if the medicine prescribed to you: ?Requires you to avoid driving or using machinery. ?Can cause constipation.  You may need to take these actions to prevent or treat constipation: ?Drink enough fluid to keep your urine pale   yellow. ?Take over-the-counter or prescription medicines. ?Eat foods that are high in fiber, such as beans, whole grains, and fresh fruits and vegetables. ?Limit foods that are high in fat and processed sugars, such as fried or sweet foods. ?Lifestyle ? ?Do exercises as told by your health care provider or physical therapist. ?Practice relaxation techniques to control your stress. You may want to try: ?Biofeedback. ?Visual imagery. ?Hypnosis. ?Muscle relaxation. ?Yoga. ?Meditation. ?Maintain a healthy lifestyle. This includes eating a healthy diet and getting enough sleep. ?Do not use any products that contain nicotine or tobacco. These products include cigarettes, chewing tobacco, and vaping devices, such as e-cigarettes. If you need help quitting, ask your health care provider. ?General instructions ?Talk to your health care provider about complementary treatments, such as acupuncture or massage. ?Do not do activities that stress or strain your muscles. This includes repetitive motions and heavy lifting. ?Keep all follow-up visits. This is important. ?Where to find support ?Consider joining a support group with others who are diagnosed with this condition. ?National Fibromyalgia Association: www.fmaware.org ?Where to find more information ?American Chronic Pain Association: www.theacpa.org ?Contact a health care provider if: ?You have new symptoms. ?Your symptoms get worse or your pain is severe. ?You have side effects from your medicines. ?You have trouble sleeping. ?Your condition is causing depression or anxiety. ?Get help right away if: ?You have thoughts of hurting yourself or others. ?Get help right awayif you feel like you may hurt yourself or others, or have thoughts about taking your own life. Go to your nearest emergency room or: ?Call 911. ?Call the National Suicide Prevention Lifeline at  1-800-273-8255 or 988. This is open 24 hours a day. ?Text the Crisis Text Line at 741741. ?Summary ?Myofascial pain syndrome and fibromyalgia are pain disorders. ?Myofascial pain syndrome has tender points in the muscles that will cause pain when pressed (trigger points). Fibromyalgia also has muscle pains and tenderness that come and go, but this condition is often associated with fatigue and sleep disturbances. ?Fibromyalgia and myofascial pain syndrome are not the same but often occur together, causing pain and fatigue that make day-to-day activities difficult. ?Follow your health care provider's instructions for taking medicines and maintaining a healthy lifestyle. ?This information is not intended to replace advice given to you by your health care provider. Make sure you discuss any questions you have with your health care provider. ?Document Revised: 01/25/2021 Document Reviewed: 01/25/2021 ?Elsevier Patient Education ? 2023 [employer] ? ?

## 2021-11-20 ENCOUNTER — Encounter: Payer: Self-pay | Admitting: Nurse Practitioner

## 2021-11-20 ENCOUNTER — Ambulatory Visit: Payer: Medicaid Other | Admitting: Nurse Practitioner

## 2021-11-20 ENCOUNTER — Other Ambulatory Visit: Payer: Self-pay | Admitting: Nurse Practitioner

## 2021-11-20 VITALS — BP 111/78 | HR 71 | Temp 98.3°F | Wt 190.0 lb

## 2021-11-20 DIAGNOSIS — G8929 Other chronic pain: Secondary | ICD-10-CM | POA: Diagnosis not present

## 2021-11-20 DIAGNOSIS — E063 Autoimmune thyroiditis: Secondary | ICD-10-CM | POA: Diagnosis not present

## 2021-11-20 DIAGNOSIS — I10 Essential (primary) hypertension: Secondary | ICD-10-CM | POA: Diagnosis not present

## 2021-11-20 DIAGNOSIS — E6609 Other obesity due to excess calories: Secondary | ICD-10-CM

## 2021-11-20 DIAGNOSIS — E782 Mixed hyperlipidemia: Secondary | ICD-10-CM | POA: Diagnosis not present

## 2021-11-20 DIAGNOSIS — M8588 Other specified disorders of bone density and structure, other site: Secondary | ICD-10-CM | POA: Diagnosis not present

## 2021-11-20 DIAGNOSIS — M797 Fibromyalgia: Secondary | ICD-10-CM

## 2021-11-20 DIAGNOSIS — D3502 Benign neoplasm of left adrenal gland: Secondary | ICD-10-CM | POA: Diagnosis not present

## 2021-11-20 DIAGNOSIS — M25561 Pain in right knee: Secondary | ICD-10-CM | POA: Diagnosis not present

## 2021-11-20 DIAGNOSIS — M25562 Pain in left knee: Secondary | ICD-10-CM

## 2021-11-20 DIAGNOSIS — Z6833 Body mass index (BMI) 33.0-33.9, adult: Secondary | ICD-10-CM

## 2021-11-20 DIAGNOSIS — Z853 Personal history of malignant neoplasm of breast: Secondary | ICD-10-CM | POA: Diagnosis not present

## 2021-11-20 DIAGNOSIS — M25551 Pain in right hip: Secondary | ICD-10-CM | POA: Diagnosis not present

## 2021-11-20 DIAGNOSIS — M25552 Pain in left hip: Secondary | ICD-10-CM

## 2021-11-20 MED ORDER — CHLORTHALIDONE 15 MG PO TABS: 15.0000 mg | ORAL_TABLET | Freq: Every day | ORAL | 0 refills | Status: DC

## 2021-11-20 NOTE — Assessment & Plan Note (Signed)
Refer to fibromyalgia plan of care. Obtain imaging of bilateral hips.

## 2021-11-20 NOTE — Assessment & Plan Note (Signed)
Ongoing, past imaging.  Recommend she restart Vitamin D3 2000 units daily.  Labs up to date.  Repeat DEXA 02/15/2026.

## 2021-11-20 NOTE — Progress Notes (Signed)
BP 111/78   Pulse 71   Temp 98.3 F (36.8 C) (Oral)   Wt 190 lb (86.2 kg)   LMP  (LMP Unknown)   SpO2 95%   BMI 33.66 kg/m    Subjective:    Patient ID: Tonya Odonnell, female    DOB: 02/05/1966, 56 y.o.   MRN: [id number]  HPI: Tonya Odonnell is a 56 y.o. female  Chief Complaint  Patient presents with   MRI     Patient is requesting an MRI and says the area is hot to touch and painful. The area is in the L hip area. Patient says this has been an ongoing issues and says lately the pain has gotten worse.    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 Patient says she had the tumor removed from her colon. Patient says she is still having pain.    Medication Consultation    Patient is wanting to discuss possibly starting a "water pill."   Hypertension    Patient says she has always noticed her blood pressure being elevated and would like to discuss with provider.    HYPERTENSION / HYPERLIPIDEMIA Continues on Metoprolol XR 50 MG BID, is not interested in taking medication for cholesterol.  History of elevation on sugars in past with recent A1c 5.3%.  She took two Metoprolol tablets this morning as BP has been running higher and she is concerned. Satisfied with current treatment? yes Duration of hypertension: chronic BP monitoring frequency: not checking BP range: 160/110 at times with headaches, recent 148/94 in GI office BP medication side effects: no Past BP meds:  Cholesterol supplements: none Aspirin: no Recent stressors: no Recurrent headaches: no Visual changes: no Palpitations: no Dyspnea: no Chest pain: no Lower extremity edema: no Dizzy/lightheaded: no  The 10-year ASCVD risk score (Arnett DK, et al., 2019) is: 2%   Values used to calculate the score:     Age: 26 years     Sex: Female     Is Non-Hispanic African American: No     Diabetic:  No     Tobacco smoker: No     Systolic Blood Pressure: 749 mmHg     Is BP treated: Yes     HDL Cholesterol: 80 mg/dL     Total Cholesterol: 278 mg/dL  BREAST CANCER: History of breast cancer in 2016, left side.  Saw oncology last 09/17/21.  Last mammogram 08/14/21.  Had chemo.  Her cancer started after swine flu vaccine she reports.  Has major concerns about cancer returning, she discussed recent adrenal mass on CT abd/pelvis ordered by oncology which is suspected to be benign adenoma - to have follow-up CT in 6 months.    ARTHRALGIAS / JOINT ACHES/ FIBROMYALGIA Has osteopenia, not currently taking her Vitamin D supplement  Took Lyrica - did not help pain + has tried Cymbalta and Gabapentin without benefit.  Saw pain clinic 06/13/21 and was provided Tramadol, she reports no benefit from this visit and made her feel loopy.  Had recent colonoscopy on 08/12/21 -- she reports having less pain after this procedure -- had precancerous polyps removed -- she is concerned for cancer due to her history and concerned that the decrease in pain means the polyps are more concerning.  She reports pain to right hip, radiating from right pelvis around to SI joint -- would like  imaging of this.    Has pain 24/7 to every muscle and bone, feels like when you have too much exercise everything feels sore daily. Past labs reassuring. Duration: months -- chronic tall over Pain: yes Symmetric: yes  8/10 Quality: dull, aching, and throbbing Frequency: constant Context:  worse Decreased function/range of motion: yes -- decreased in hand function Erythema: none Swelling: yes Heat or warmth: yes Morning stiffness: yes Aggravating factors: unknown Alleviating factors: only not moving Relief with NSAIDs?: moderate Treatments attempted:  Ibuprofen and CBD oil  Involved Joints: all joints.  HYPOTHYROIDISM Taking Levothyroxine 100 MCG daily.    She was told in past she needs to check parathyroid ever year. Thyroid  control status:stable Satisfied with current treatment? yes Medication side effects: no Medication compliance: good compliance Etiology of hypothyroidism:  Recent dose adjustment:no Fatigue: yes Cold intolerance: no Heat intolerance: no Weight gain: no Weight loss: no Constipation: no Diarrhea/loose stools: no Palpitations: no Lower extremity edema: no Anxiety/depressed mood: no   Relevant past medical, surgical, family and social history reviewed and updated as indicated. Interim medical history since our last visit reviewed. Allergies and medications reviewed and updated.  Review of Systems  Constitutional:  Negative for activity change, appetite change, diaphoresis, fatigue and fever.  Respiratory:  Negative for cough, chest tightness and shortness of breath.   Cardiovascular:  Negative for chest pain, palpitations and leg swelling.  Gastrointestinal: Negative.   Endocrine: Negative for cold intolerance, heat intolerance, polydipsia, polyphagia and polyuria.  Musculoskeletal:  Positive for arthralgias.  Neurological: Negative.   Psychiatric/Behavioral: Negative.      Per HPI unless specifically indicated above     Objective:    BP 111/78   Pulse 71   Temp 98.3 F (36.8 C) (Oral)   Wt 190 lb (86.2 kg)   LMP  (LMP Unknown)   SpO2 95%   BMI 33.66 kg/m   Wt Readings from Last 3 Encounters:  11/20/21 190 lb (86.2 kg)  10/08/21 186 lb 2 oz (84.4 kg)  08/21/21 184 lb 4.8 oz (83.6 kg)    Physical Exam Vitals and nursing note reviewed.  Constitutional:      General: She is awake. She is not in acute distress.    Appearance: She is well-developed and well-groomed. She is obese. She is not ill-appearing or toxic-appearing.  HENT:     Head: Normocephalic.     Right Ear: Hearing normal.     Left Ear: Hearing normal.  Eyes:     General: Lids are normal.        Right eye: No discharge.        Left eye: No discharge.     Conjunctiva/sclera: Conjunctivae normal.      Pupils: Pupils are equal, round, and reactive to light.  Neck:     Thyroid: No thyromegaly.     Vascular: No carotid bruit.  Cardiovascular:     Rate and Rhythm: Normal rate and regular rhythm.     Heart sounds: Normal heart sounds. No murmur heard.    No gallop.  Pulmonary:     Effort: Pulmonary effort is normal. No accessory muscle usage or respiratory distress.     Breath sounds: Normal breath sounds.  Abdominal:     General: Bowel sounds are normal.     Palpations: Abdomen is soft. There is no hepatomegaly or splenomegaly.  Musculoskeletal:     Cervical back: Normal range of motion and neck supple.     Right lower leg: No edema.  Left lower leg: No edema.  Lymphadenopathy:     Cervical: No cervical adenopathy.  Skin:    General: Skin is warm and dry.  Neurological:     Mental Status: She is alert and oriented to person, place, and time.  Psychiatric:        Attention and Perception: Attention normal.        Mood and Affect: Mood normal.        Speech: Speech normal.        Behavior: Behavior normal. Behavior is cooperative.        Thought Content: Thought content normal.    Results for orders placed or performed in visit on 08/21/21  CMP ([facility] only)  Result Value Ref Range   Sodium 140 135 - 145 mmol/L   Potassium 3.7 3.5 - 5.1 mmol/L   Chloride 107 98 - 111 mmol/L   CO2 27 22 - 32 mmol/L   Glucose, Bld 101 (H) 70 - 99 mg/dL   BUN 19 6 - 20 mg/dL   Creatinine 0.75 0.44 - 1.00 mg/dL   Calcium 9.6 8.9 - 10.3 mg/dL   Total Protein 7.5 6.5 - 8.1 g/dL   Albumin 4.4 3.5 - 5.0 g/dL   AST 20 15 - 41 U/L   ALT 24 0 - 44 U/L   Alkaline Phosphatase 79 38 - 126 U/L   Total Bilirubin 1.2 0.3 - 1.2 mg/dL   GFR, Estimated >60 >60 mL/min   Anion gap 6 5 - 15  CBC with Differential (Cancer Center Only)  Result Value Ref Range   WBC Count 6.4 4.0 - 10.5 K/uL   RBC 5.27 (H) 3.87 - 5.11 MIL/uL   Hemoglobin 15.3 (H) 12.0 - 15.0 g/dL   HCT 45.4 36.0 - 46.0 %   MCV  86.1 80.0 - 100.0 fL   MCH 29.0 26.0 - 34.0 pg   MCHC 33.7 30.0 - 36.0 g/dL   RDW 14.0 11.5 - 15.5 %   Platelet Count 321 150 - 400 K/uL   nRBC 0.0 0.0 - 0.2 %   Neutrophils Relative % 53 %   Neutro Abs 3.5 1.7 - 7.7 K/uL   Lymphocytes Relative 37 %   Lymphs Abs 2.4 0.7 - 4.0 K/uL   Monocytes Relative 6 %   Monocytes Absolute 0.4 0.1 - 1.0 K/uL   Eosinophils Relative 2 %   Eosinophils Absolute 0.1 0.0 - 0.5 K/uL   Basophils Relative 1 %   Basophils Absolute 0.1 0.0 - 0.1 K/uL   Immature Granulocytes 1 %   Abs Immature Granulocytes 0.03 0.00 - 0.07 K/uL      Assessment & Plan:   Problem List Items Addressed This Visit       Cardiovascular and Mediastinum   Essential (primary) hypertension - Primary    Chronic, ongoing with recent elevations on occasiona.  BP at goal on check today.  Recommend she monitor BP at least a few mornings a week at home and document.  DASH diet at home.  Continue Metoprolol at current dosing and recommend she not increase without alerting provider, add on Chlorthalidone 15 MG daily which may benefit BP and osteopenia.  Labs today: CMP.  Return in 6 weeks.      Relevant Medications   chlorthalidone (HYGROTEN) 15 MG tablet   Other Relevant Orders   Comprehensive metabolic panel     Endocrine   Adrenal adenoma, left    Noted on CT July 2023 - 10 mm adrenal  gland nodule.  Discussed with patient and offered reassurance.  Plan for repeat CT on or around 03/11/22.      Hashimoto's thyroiditis    Chronic, ongoing. Continue current medication regimen and adjust as needed based on labs.  TSH and Free T4 today.      Relevant Orders   TSH   T4, free     Musculoskeletal and Integument   Osteopenia of lumbar spine    Ongoing, past imaging.  Recommend she restart Vitamin D3 2000 units daily.  Labs up to date.  Repeat DEXA 02/15/2026.        Other   Chronic arthralgias of knees and hips    Refer to fibromyalgia plan of care. Obtain imaging of bilateral  hips.      Relevant Orders   DG Hip Unilat W OR W/O Pelvis 2-3 Views Right   DG Hip Unilat W OR W/O Pelvis 2-3 Views Left   Fibromyalgia    Chronic, ongoing. Currently recommend she continue home regimen.  Will obtain imaging of her hips bilaterally, may need ortho referral in future.      History of breast cancer    History of in 2016 -- continue collaboration with oncology and plastic surgery.  Will discuss with oncology patient concerns and see if support group available for her to attend due to her anxiety with her past cancer.      Hyperlipidemia    Chronic, ongoing with ASCVD 2%.  Recommend continued focus on diet and regular activity.      Relevant Medications   chlorthalidone (HYGROTEN) 15 MG tablet   Obesity    BMI 33.66.  Recommended eating smaller high protein, low fat meals more frequently and exercising 30 mins a day 5 times a week with a goal of 10-15lb weight loss in the next 3 months. Patient voiced their understanding and motivation to adhere to these recommendations.        Follow up plan: Return in about 6 weeks (around 01/01/2022) for FOLLOW-UP PAIN.

## 2021-11-20 NOTE — Assessment & Plan Note (Signed)
Chronic, ongoing with ASCVD 2%.  Recommend continued focus on diet and regular activity.

## 2021-11-20 NOTE — Assessment & Plan Note (Signed)
Chronic, ongoing. Continue current medication regimen and adjust as needed based on labs.  TSH and Free T4 today. 

## 2021-11-20 NOTE — Assessment & Plan Note (Signed)
BMI 33.66.  Recommended eating smaller high protein, low fat meals more frequently and exercising 30 mins a day 5 times a week with a goal of 10-15lb weight loss in the next 3 months. Patient voiced their understanding and motivation to adhere to these recommendations. ? ?

## 2021-11-20 NOTE — Assessment & Plan Note (Signed)
Chronic, ongoing. Currently recommend she continue home regimen.  Will obtain imaging of her hips bilaterally, may need ortho referral in future.

## 2021-11-20 NOTE — Assessment & Plan Note (Addendum)
Noted on CT July 2023 - 10 mm adrenal gland nodule.  Discussed with patient and offered reassurance.  Plan for repeat CT on or around 03/11/22.

## 2021-11-20 NOTE — Assessment & Plan Note (Addendum)
History of in 2016 -- continue collaboration with oncology and plastic surgery.  Will discuss with oncology patient concerns and see if support group available for her to attend due to her anxiety with her past cancer.

## 2021-11-20 NOTE — Assessment & Plan Note (Addendum)
Chronic, ongoing with recent elevations on occasiona.  BP at goal on check today.  Recommend she monitor BP at least a few mornings a week at home and document.  DASH diet at home.  Continue Metoprolol at current dosing and recommend she not increase without alerting provider, add on Chlorthalidone 15 MG daily which may benefit BP and osteopenia.  Labs today: CMP.  Return in 6 weeks.

## 2021-11-21 ENCOUNTER — Other Ambulatory Visit: Payer: Self-pay | Admitting: Nurse Practitioner

## 2021-11-21 LAB — COMPREHENSIVE METABOLIC PANEL
ALT: 27 IU/L (ref 0–32)
AST: 27 IU/L (ref 0–40)
Albumin/Globulin Ratio: 1.8 (ref 1.2–2.2)
Albumin: 4.7 g/dL (ref 3.8–4.9)
Alkaline Phosphatase: 92 IU/L (ref 44–121)
BUN/Creatinine Ratio: 20 (ref 9–23)
BUN: 18 mg/dL (ref 6–24)
Bilirubin Total: 1 mg/dL (ref 0.0–1.2)
CO2: 21 mmol/L (ref 20–29)
Calcium: 10 mg/dL (ref 8.7–10.2)
Chloride: 104 mmol/L (ref 96–106)
Creatinine, Ser: 0.88 mg/dL (ref 0.57–1.00)
Globulin, Total: 2.6 g/dL (ref 1.5–4.5)
Glucose: 110 mg/dL — ABNORMAL HIGH (ref 70–99)
Potassium: 4.4 mmol/L (ref 3.5–5.2)
Sodium: 143 mmol/L (ref 134–144)
Total Protein: 7.3 g/dL (ref 6.0–8.5)
eGFR: 78 mL/min/{1.73_m2} (ref >59)

## 2021-11-21 LAB — TSH: TSH: 4.96 u[IU]/mL — ABNORMAL HIGH (ref 0.450–4.500)

## 2021-11-21 LAB — T4, FREE: Free T4: 1.66 ng/dL (ref 0.82–1.77)

## 2021-11-21 MED ORDER — LEVOTHYROXINE SODIUM 125 MCG PO TABS: 125.0000 ug | ORAL_TABLET | Freq: Every day | ORAL | 3 refills | Status: DC

## 2021-11-21 MED ORDER — CHLORTHALIDONE 25 MG PO TABS: 25.0000 mg | ORAL_TABLET | Freq: Every day | ORAL | 4 refills | Status: DC

## 2021-11-21 NOTE — Addendum Note (Signed)
Addended by: Marnee Guarneri T on: 11/21/2021 07:16 PM   Modules accepted: Orders

## 2021-11-21 NOTE — Progress Notes (Signed)
Contacted via MyChart   Good afternoon Guiliana, your labs have returned and overall kidney function, creatinine and eGFR, remains normal, as is liver function, AST and ALT.  Thyroid level shows some mild elevation in TSH and stable Free T4, we can try going up to 125 MCG on your thyroid medication as if thyroid levels are off you can feel bad.  I have sent in the new dose to start and we will recheck labs in 6 weeks.  Any questions? Keep being wonderful!!  Thank you for allowing me to participate in your care.  I appreciate you. Kindest regards, Amazing Cowman

## 2021-11-21 NOTE — Telephone Encounter (Signed)
Requested medication (s) are due for refill today:yes for another medication  Requested medication (s) are on the active medication list:yes  Last refill:  11/20/21  Future visit scheduled: yes  Notes to clinic:  Unable to refill per protocol, last refill by provider 11/20/21. Pharmacy request:Alternative Requested:15 MG ONLY COMES AS BRAND NAME THALITONE, CAN YOU JUST SEND CHLORTHALIDONE 25 MG? THALITONE IS NOT COVERED.     Requested Prescriptions  Pending Prescriptions Disp Refills   chlorthalidone (HYGROTON) 25 MG tablet [Pharmacy Med Name: CHLORTHALIDONE 25 MG TABLET]  0    Sig: Take 1 tablet (15 mg total) by mouth daily.     Cardiovascular: Diuretics - Thiazide Passed - 11/20/2021  2:49 PM      Passed - Cr in normal range and within 180 days    Creatinine  Date Value Ref Range Status  08/21/2021 0.75 0.44 - 1.00 mg/dL Final   Creatinine, Ser  Date Value Ref Range Status  11/20/2021 0.88 0.57 - 1.00 mg/dL Final         Passed - K in normal range and within 180 days    Potassium  Date Value Ref Range Status  11/20/2021 4.4 3.5 - 5.2 mmol/L Final         Passed - Na in normal range and within 180 days    Sodium  Date Value Ref Range Status  11/20/2021 143 134 - 144 mmol/L Final         Passed - Last BP in normal range    BP Readings from Last 1 Encounters:  11/20/21 111/78         Passed - Valid encounter within last 6 months    Recent Outpatient Visits           Yesterday Essential (primary) hypertension   [place], Barbaraann Faster, NP   5 months ago [place], [place] T, NP   6 months ago History of breast cancer   [place] Cannady, Barbaraann Faster, NP   10 months ago Pain   [place] Vigg, Avanti, MD   11 months ago Bleeding   [place] Vigg, Avanti, MD       Future Appointments             In 3 weeks Cannady, Barbaraann Faster, NP [facility], [place]   In  3 months Brendolyn Patty, MD [place]

## 2021-11-22 ENCOUNTER — Other Ambulatory Visit: Payer: Self-pay | Admitting: Nurse Practitioner

## 2021-11-22 ENCOUNTER — Other Ambulatory Visit: Payer: Self-pay

## 2021-11-22 MED ORDER — CHOLECALCIFEROL 25 MCG (1000 UT) PO TABS: 1000.0000 [IU] | ORAL_TABLET | Freq: Every day | ORAL | 4 refills | Status: DC

## 2021-11-22 NOTE — Telephone Encounter (Signed)
Medication refill for Vitamin D3 last ov 11/20/21, upcoming ov 12/17/21 . Please advise

## 2021-11-25 MED ORDER — VALACYCLOVIR HCL 1 G PO TABS: 1000.0000 mg | ORAL_TABLET | Freq: Every day | ORAL | 4 refills | Status: DC

## 2021-11-25 MED ORDER — METOPROLOL SUCCINATE ER 50 MG PO TB24: 50.0000 mg | ORAL_TABLET | Freq: Two times a day (BID) | ORAL | 4 refills | Status: DC

## 2021-11-25 MED ORDER — CROMOLYN SODIUM 4 % OP SOLN: 1.0000 [drp] | Freq: Four times a day (QID) | OPHTHALMIC | 4 refills | Status: DC

## 2021-11-25 NOTE — Telephone Encounter (Signed)
LOV 11/20/21  Future visit 12/17/21

## 2021-11-30 ENCOUNTER — Other Ambulatory Visit: Payer: Self-pay | Admitting: Dermatology

## 2021-11-30 DIAGNOSIS — L659 Nonscarring hair loss, unspecified: Secondary | ICD-10-CM

## 2021-12-15 NOTE — Patient Instructions (Signed)

## 2021-12-17 ENCOUNTER — Encounter: Payer: Self-pay | Admitting: Nurse Practitioner

## 2021-12-17 ENCOUNTER — Ambulatory Visit: Payer: Medicaid Other | Admitting: Nurse Practitioner

## 2021-12-17 VITALS — BP 128/86 | HR 63 | Temp 98.2°F | Ht 63.0 in | Wt 189.9 lb

## 2021-12-17 DIAGNOSIS — M25552 Pain in left hip: Secondary | ICD-10-CM

## 2021-12-17 DIAGNOSIS — I10 Essential (primary) hypertension: Secondary | ICD-10-CM | POA: Diagnosis not present

## 2021-12-17 DIAGNOSIS — M797 Fibromyalgia: Secondary | ICD-10-CM

## 2021-12-17 DIAGNOSIS — R7303 Prediabetes: Secondary | ICD-10-CM | POA: Diagnosis not present

## 2021-12-17 DIAGNOSIS — E6609 Other obesity due to excess calories: Secondary | ICD-10-CM | POA: Diagnosis not present

## 2021-12-17 DIAGNOSIS — G8929 Other chronic pain: Secondary | ICD-10-CM

## 2021-12-17 DIAGNOSIS — E063 Autoimmune thyroiditis: Secondary | ICD-10-CM

## 2021-12-17 DIAGNOSIS — M25551 Pain in right hip: Secondary | ICD-10-CM | POA: Diagnosis not present

## 2021-12-17 DIAGNOSIS — M25561 Pain in right knee: Secondary | ICD-10-CM

## 2021-12-17 DIAGNOSIS — E782 Mixed hyperlipidemia: Secondary | ICD-10-CM

## 2021-12-17 DIAGNOSIS — Z853 Personal history of malignant neoplasm of breast: Secondary | ICD-10-CM | POA: Diagnosis not present

## 2021-12-17 DIAGNOSIS — Z6833 Body mass index (BMI) 33.0-33.9, adult: Secondary | ICD-10-CM | POA: Diagnosis not present

## 2021-12-17 DIAGNOSIS — M25562 Pain in left knee: Secondary | ICD-10-CM

## 2021-12-17 MED ORDER — HYDROCHLOROTHIAZIDE 12.5 MG PO TABS: 12.5000 mg | ORAL_TABLET | Freq: Every day | ORAL | 3 refills | Status: DC

## 2021-12-17 NOTE — Assessment & Plan Note (Signed)
Refer to fibromyalgia plan of care. Obtain imaging of bilateral hips.

## 2021-12-17 NOTE — Assessment & Plan Note (Signed)
Chronic, ongoing. Currently recommend she continue home regimen.  Will obtain imaging of her hips bilaterally, may need ortho referral in future.

## 2021-12-17 NOTE — Assessment & Plan Note (Signed)
Chronic, ongoing.  Did not tolerate Chlorthalidone and prefers to go to HCTZ which she took in past with tolerance.  BP at goal on recheck today.  Recommend she monitor BP at least a few mornings a week at home and document.  DASH diet at home.  Continue Metoprolol at current dosing and recommend she not increase without alerting provider, add on HCTZ 12.5 MG daily.  Labs: up to date.  Return in 4 weeks.

## 2021-12-17 NOTE — Assessment & Plan Note (Signed)
History of in 2016 -- continue collaboration with oncology and plastic surgery.  Will place new referral to alternate oncology clinic per patient request.

## 2021-12-17 NOTE — Assessment & Plan Note (Signed)
Chronic, ongoing. Continue current medication regimen and adjust as needed based on labs.  TSH and Free T4 today. 

## 2021-12-17 NOTE — Assessment & Plan Note (Addendum)
Noted on past labs, check level today and recommend diet focus.

## 2021-12-17 NOTE — Assessment & Plan Note (Signed)
Chronic, ongoing with ASCVD 2%.  Recommend continued focus on diet and regular activity. Lipid panel today.

## 2021-12-17 NOTE — Progress Notes (Signed)
BP 128/86 (BP Location: Left Arm, Patient Position: Sitting, Cuff Size: Large)   Pulse 63   Temp 98.2 F (36.8 C) (Oral)   Ht '5\' 3"'$  (1.6 m)   Wt 189 lb 14.4 oz (86.1 kg)   LMP  (LMP Unknown)   SpO2 97%   BMI 33.64 kg/m    Subjective:    Patient ID: Tonya Odonnell, female    DOB: [birth year], 56 y.o.   MRN: [id number]  HPI: Tonya Odonnell is a 56 y.o. female  Chief Complaint  Patient presents with   Hyperlipidemia    Patient says she is here to have her labs rechecked for her Cholesterol.    Hypertension   Fibromyalgia   Prediabetes   Breast Cancer   HYPERTENSION / HYPERLIPIDEMIA Continues on Metoprolol XR 50 MG BID + last visit added on Chlorthalidone due to elevations at home and in office -- reports this made her chest heavy and stopped taking -- would like to change to HCTZ which she tolerate in past.  Not interested in taking medication for cholesterol.  History of elevation on sugars in past, last A1c 5.3%.   Satisfied with current treatment? yes Duration of hypertension: chronic BP monitoring frequency: not checking BP range:  BP medication side effects: no Past BP meds:  Cholesterol supplements: none Aspirin: no Recent stressors: no Recurrent headaches: no Visual changes: no Palpitations: no Dyspnea: no Chest pain: no Lower extremity edema: no Dizzy/lightheaded: no  The 10-year ASCVD risk score (Arnett DK, et al., 2019) is: 2.7%   Values used to calculate the score:     Age: 58 years     Sex: Female     Is Non-Hispanic African American: No     Diabetic: No     Tobacco smoker: No     Systolic Blood Pressure: 355 mmHg     Is BP treated: Yes     HDL Cholesterol: 80 mg/dL     Total Cholesterol: 278 mg/dL  ARTHRALGIAS / JOINT ACHES/ FIBROMYALGIA Has osteopenia, not currently taking her Vitamin D supplement, have recommended she start this.  Has tried Cymbalta, Lyrica, and Gabapentin without benefit.  Saw pain clinic 06/13/21 and was provided Tramadol, she  reports no benefit from this visit, makes her feel bad.  She reports pain to right hip, radiating from right pelvis around to SI joint -- would like imaging of this which was ordered last visit -- has not obtained.  Sees [facility]ology in February -- per referral from other provider.  Has pain 24/7 to every muscle and bone. Duration: months -- chronic tall over Pain: yes Symmetric: yes  8/10 Quality: dull, aching, and throbbing Frequency: constant Context:  worse Decreased function/range of motion: yes -- decreased in hand function Erythema: none Swelling: yes Heat or warmth: yes Morning stiffness: yes Aggravating factors: unknown Alleviating factors: only not moving Relief with NSAIDs?: moderate Treatments attempted:  Ibuprofen and CBD oil  Involved Joints: all joints.  HYPOTHYROIDISM Taking Levothyroxine 125 MCG daily.  She was told in past she needs to check parathyroid ever year. Thyroid control status:stable Satisfied with current treatment? yes Medication side effects: no Medication compliance: good compliance Etiology of hypothyroidism: unknown Recent dose adjustment: yes recent increase Fatigue: yes Cold intolerance: no Heat intolerance: no Weight gain: no Weight loss: no Constipation: no Diarrhea/loose stools: no Palpitations: no Lower extremity edema: no Anxiety/depressed mood: no   BREAST CANCER: History of breast cancer in 2016, left side.  Saw oncology last 09/17/21.  Last  mammogram 08/14/21.  History of chemo.  Her cancer started after swine flu vaccine she reports.  Has major concerns about cancer returning, she discussed recent adrenal mass on CT abd/pelvis ordered by oncology which is suspected to be benign adenoma - to have follow-up CT in 6 months.  She would like to see a different oncologist, is requesting this again today.    Relevant past medical, surgical, family and social history reviewed and updated as indicated. Interim medical history since our  last visit reviewed. Allergies and medications reviewed and updated.  Review of Systems  Constitutional:  Negative for activity change, appetite change, diaphoresis, fatigue and fever.  Respiratory:  Negative for cough, chest tightness and shortness of breath.   Cardiovascular:  Negative for chest pain, palpitations and leg swelling.  Gastrointestinal: Negative.   Endocrine: Negative for cold intolerance, heat intolerance, polydipsia, polyphagia and polyuria.  Musculoskeletal:  Positive for arthralgias.  Neurological: Negative.   Psychiatric/Behavioral: Negative.      Per HPI unless specifically indicated above     Objective:    BP 128/86 (BP Location: Left Arm, Patient Position: Sitting, Cuff Size: Large)   Pulse 63   Temp 98.2 F (36.8 C) (Oral)   Ht 5\' 3"  (1.6 m)   Wt 189 lb 14.4 oz (86.1 kg)   LMP  (LMP Unknown)   SpO2 97%   BMI 33.64 kg/m   Wt Readings from Last 3 Encounters:  12/17/21 189 lb 14.4 oz (86.1 kg)  11/20/21 190 lb (86.2 kg)  10/08/21 186 lb 2 oz (84.4 kg)    Physical Exam Vitals and nursing note reviewed.  Constitutional:      General: She is awake. She is not in acute distress.    Appearance: She is well-developed and well-groomed. She is obese. She is not ill-appearing or toxic-appearing.  HENT:     Head: Normocephalic.     Right Ear: Hearing normal.     Left Ear: Hearing normal.  Eyes:     General: Lids are normal.        Right eye: No discharge.        Left eye: No discharge.     Conjunctiva/sclera: Conjunctivae normal.     Pupils: Pupils are equal, round, and reactive to light.  Neck:     Thyroid: No thyromegaly.     Vascular: No carotid bruit.  Cardiovascular:     Rate and Rhythm: Normal rate and regular rhythm.     Heart sounds: Normal heart sounds. No murmur heard.    No gallop.  Pulmonary:     Effort: Pulmonary effort is normal. No accessory muscle usage or respiratory distress.     Breath sounds: Normal breath sounds.  Abdominal:      General: Bowel sounds are normal.     Palpations: Abdomen is soft. There is no hepatomegaly or splenomegaly.  Musculoskeletal:     Cervical back: Normal range of motion and neck supple.     Right lower leg: No edema.     Left lower leg: No edema.  Lymphadenopathy:     Cervical: No cervical adenopathy.  Skin:    General: Skin is warm and dry.  Neurological:     Mental Status: She is alert and oriented to person, place, and time.  Psychiatric:        Attention and Perception: Attention normal.        Mood and Affect: Mood normal.        Speech: Speech normal.  Behavior: Behavior normal. Behavior is cooperative.        Thought Content: Thought content normal.    Results for orders placed or performed in visit on 11/20/21  Comprehensive metabolic panel  Result Value Ref Range   Glucose 110 (H) 70 - 99 mg/dL   BUN 18 6 - 24 mg/dL   Creatinine, Ser 0.88 0.57 - 1.00 mg/dL   eGFR 78 >59 mL/min/1.73   BUN/Creatinine Ratio 20 9 - 23   Sodium 143 134 - 144 mmol/L   Potassium 4.4 3.5 - 5.2 mmol/L   Chloride 104 96 - 106 mmol/L   CO2 21 20 - 29 mmol/L   Calcium 10.0 8.7 - 10.2 mg/dL   Total Protein 7.3 6.0 - 8.5 g/dL   Albumin 4.7 3.8 - 4.9 g/dL   Globulin, Total 2.6 1.5 - 4.5 g/dL   Albumin/Globulin Ratio 1.8 1.2 - 2.2   Bilirubin Total 1.0 0.0 - 1.2 mg/dL   Alkaline Phosphatase 92 44 - 121 IU/L   AST 27 0 - 40 IU/L   ALT 27 0 - 32 IU/L  TSH  Result Value Ref Range   TSH 4.960 (H) 0.450 - 4.500 uIU/mL  T4, free  Result Value Ref Range   Free T4 1.66 0.82 - 1.77 ng/dL      Assessment & Plan:   Problem List Items Addressed This Visit       Cardiovascular and Mediastinum   Essential (primary) hypertension    Chronic, ongoing.  Did not tolerate Chlorthalidone and prefers to go to HCTZ which she took in past with tolerance.  BP at goal on recheck today.  Recommend she monitor BP at least a few mornings a week at home and document.  DASH diet at home.  Continue  Metoprolol at current dosing and recommend she not increase without alerting provider, add on HCTZ 12.5 MG daily.  Labs: up to date.  Return in 4 weeks.      Relevant Medications   hydrochlorothiazide (HYDRODIURIL) 12.5 MG tablet     Endocrine   Hashimoto's thyroiditis - Primary    Chronic, ongoing. Continue current medication regimen and adjust as needed based on labs.  TSH and Free T4 today.      Relevant Orders   T4, free   TSH   PTH, intact and calcium     Other   Chronic arthralgias of knees and hips    Refer to fibromyalgia plan of care. Obtain imaging of bilateral hips.      Fibromyalgia    Chronic, ongoing. Currently recommend she continue home regimen.  Will obtain imaging of her hips bilaterally, may need ortho referral in future.      History of breast cancer    History of in 2016 -- continue collaboration with oncology and plastic surgery.  Will place new referral to alternate oncology clinic per patient request.      Relevant Orders   Ambulatory referral to Hematology / Oncology   Hyperlipidemia    Chronic, ongoing with ASCVD 2%.  Recommend continued focus on diet and regular activity. Lipid panel today.      Relevant Medications   hydrochlorothiazide (HYDRODIURIL) 12.5 MG tablet   Other Relevant Orders   Lipid Panel w/o Chol/HDL Ratio   Obesity    BMI 33.64.  Recommended eating smaller high protein, low fat meals more frequently and exercising 30 mins a day 5 times a week with a goal of 10-15lb weight loss in the next 3 months. Patient  voiced their understanding and motivation to adhere to these recommendations.      Prediabetes    Noted on past labs, check level today and recommend diet focus.        Relevant Orders   HgB A1c     Follow up plan: Return in about 4 weeks (around 01/14/2022) for HTN check.

## 2021-12-17 NOTE — Assessment & Plan Note (Signed)
BMI 33.64.  Recommended eating smaller high protein, low fat meals more frequently and exercising 30 mins a day 5 times a week with a goal of 10-15lb weight loss in the next 3 months. Patient voiced their understanding and motivation to adhere to these recommendations.

## 2021-12-18 LAB — HEMOGLOBIN A1C
Est. average glucose Bld gHb Est-mCnc: 108 mg/dL
Hgb A1c MFr Bld: 5.4 % (ref 4.8–5.6)

## 2021-12-18 LAB — LIPID PANEL W/O CHOL/HDL RATIO
Cholesterol, Total: 232 mg/dL — ABNORMAL HIGH (ref 100–199)
HDL: 76 mg/dL (ref >39)
LDL Chol Calc (NIH): 143 mg/dL — ABNORMAL HIGH (ref 0–99)
Triglycerides: 73 mg/dL (ref 0–149)
VLDL Cholesterol Cal: 13 mg/dL (ref 5–40)

## 2021-12-18 LAB — TSH: TSH: 3.37 u[IU]/mL (ref 0.450–4.500)

## 2021-12-18 LAB — T4, FREE: Free T4: 1.93 ng/dL — ABNORMAL HIGH (ref 0.82–1.77)

## 2021-12-18 NOTE — Progress Notes (Signed)
Contacted via [place] The 10-year ASCVD risk score (Arnett DK, et al., 2019) is: 2.3%   Values used to calculate the score:     Age: 56 years     Sex: Female     Is Non-Hispanic African American: No     Diabetic: No     Tobacco smoker: No     Systolic Blood Pressure: 614 mmHg     Is BP treated: Yes     HDL Cholesterol: 76 mg/dL     Total Cholesterol: 232 mg/dL   Good afternoon Tonya Odonnell, your labs have returned: - Thyroid labs show normal TSH, but Free T4 a little elevated -- for now continue current Levothyroxine dosing and we will monitor + recheck next visit. - A1c shows no prediabetes or diabetes. - Cholesterol labs remain a little elevated, but improved from previous check.  No medication needed at this time.  Focus heavily on diet and regular activity.  Any questions? Keep being stellar!!  Thank you for allowing me to participate in your care.  I appreciate you. Kindest regards, Charles Andringa

## 2021-12-24 ENCOUNTER — Inpatient Hospital Stay: Payer: Medicaid Other | Attending: Adult Health | Admitting: Internal Medicine

## 2021-12-24 ENCOUNTER — Encounter: Payer: Self-pay | Admitting: Internal Medicine

## 2021-12-24 ENCOUNTER — Inpatient Hospital Stay: Payer: Medicaid Other

## 2021-12-24 VITALS — BP 151/100 | HR 64 | Temp 97.6°F | Resp 18 | Wt 192.0 lb

## 2021-12-24 DIAGNOSIS — M8580 Other specified disorders of bone density and structure, unspecified site: Secondary | ICD-10-CM

## 2021-12-24 DIAGNOSIS — Z800 Family history of malignant neoplasm of digestive organs: Secondary | ICD-10-CM

## 2021-12-24 DIAGNOSIS — Z9221 Personal history of antineoplastic chemotherapy: Secondary | ICD-10-CM

## 2021-12-24 DIAGNOSIS — Z79624 Long term (current) use of inhibitors of nucleotide synthesis: Secondary | ICD-10-CM | POA: Diagnosis not present

## 2021-12-24 DIAGNOSIS — Z170 Estrogen receptor positive status [ER+]: Secondary | ICD-10-CM

## 2021-12-24 DIAGNOSIS — Z853 Personal history of malignant neoplasm of breast: Secondary | ICD-10-CM

## 2021-12-24 DIAGNOSIS — Z79899 Other long term (current) drug therapy: Secondary | ICD-10-CM

## 2021-12-24 DIAGNOSIS — Z9012 Acquired absence of left breast and nipple: Secondary | ICD-10-CM

## 2021-12-24 NOTE — Progress Notes (Signed)
Patient here today for initial evaluation regarding history of breast cancer.

## 2021-12-24 NOTE — Progress Notes (Signed)
[facility] CONSULT NOTE  Patient Care Team: [name], NP as PCP - General (Nurse Practitioner)  REFERRING PROVIDER: Marnee Guarneri, NP  REASON FOR REFFERAL: history of breast cancer  CANCER STAGING   Cancer Staging  No matching staging information was found for the patient.  ASSESSMENT & PLAN:  Tonya Odonnell 56 y.o. 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  She discontinued endocrine therapy in December 2022. Detailed oncology history as below.  Of note, last mammogram from May 2023 was unremarkable.  Patient has been following with Wilber Bihari, NP from medical oncology at [facility].  She was having pain in her right shoulder and abdomen.  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  Of note, she also had mammogram done in May 2023 which was normal.  She wanted to transfer her care to [facility].  She has mammogram already scheduled for May 2024.  She continues to feel very anxious about the cancer coming back.  She was feeling overwhelmed and was tearful during the encounter.  I offered referral to psychology which patient declined.  She reports generalized pain in her body.  She is scheduled to see rheumatologist at [facility] in February 2024.  Discussed with the patient that we do not normally perform any CT scans for surveillance.  She will be getting mammogram annually.  Also blood work is not recommended but will do CBC and CMP for assurance.   Orders Placed This Encounter  Procedures   CBC with Differential    Standing Status:   Future    Standing Expiration Date:   12/24/2022   Comprehensive metabolic panel    Standing Status:   Future    Standing Expiration Date:   12/24/2022   DC in June 2024 for MD visit, labs, discuss mammo  The total time spent in  the appointment was 45 minutes encounter with patients including review of chart and various tests results, discussions about plan of care and coordination of care plan   All questions were answered. The patient knows to call the clinic with any problems, questions or concerns. No barriers to learning was detected.  Jane Canary, MD 10/17/20231:46 PM   HISTORY OF PRESENTING ILLNESS:  Tonya Odonnell 56 y.o. female with pmh of left breast IDC in 2016.  Oncology history as below.   Patient has been following with Wilber Bihari, NP from medical oncology at [facility].  She was having pain in her right shoulder and abdomen.  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  Of note, she also had mammogram done in May 2023 which was normal.  She wanted to transfer her care to [facility] and so was seen today.  I have reviewed her chart and materials related to her cancer extensively and collaborated history with the patient. Summary of oncologic history is as follows: Oncology History  History of breast cancer  08/2014 Initial Diagnosis   After many years of complains of swelling in the left side of the body and tenderness in her breast, she was diagnosed with grade 2 IDC 2.1 cm left breast 3 o'clock position   12/2014 Surgery   Left lumpectomy: Grade 2 multifocal invasive ductal carcinoma 1.6 cm, 0.3 cm, DCIS low-grade, lymphovascular invasion present, 1/2 lymph nodes positive  ER positive greater than 95%, PR positive greater than 95%, HER2 equivocal 2+ by IHC and FISH was negative (Dr.Lo)   01/17/2015 Surgery   Left mastectomy (done because of close margins): Residual LCIS, 1/5 lymph nodes positive (total 2/10 lymph nodes positive)   01/17/2015 Oncotype testing   Oncotype DX score: 20   2016 - 2017 Chemotherapy   Adjuvant chemotherapy (patient is not sure of what drugs she received but that she had 8 doses)   07/28/2015 -  Anti-estrogen oral  therapy   Patient was on and off antiestrogen therapy since May 2017.  Unable to tolerate tamoxifen or Zoladex.  Eventually after she became menopausal, started on Arimidex January 2021 (takes Cymbalta for hot flashes). Reports discontinued in Dec 2022 as advised by Oncologist in [place].      MEDICAL HISTORY:  Past Medical History:  Diagnosis Date   Anemia    "USE TO HAVE IT,NOT NOW,2015,2016   Arthritis    HIPS,KNEES,HANDS   Breast cancer in female ([place])    Cancer ([place])    LEFT   Hyperlipidemia    Hypertension    Neuromuscular disorder ([place])    MUSCLE PAIN,FIBROMYALGIA   Osteopenia    PVC (premature ventricular contraction)    Thyroid disease     SURGICAL HISTORY: Past Surgical History:  Procedure Laterality Date   Left Ovarian removal Left    MASTECTOMY Left    "LYMPHNODES REMOVED    SOCIAL HISTORY: Social History   Socioeconomic History   Marital status: Married    Spouse name: Not on file   Number of children: Not on file   Years of education: Not on file   Highest education level: Not on file  Occupational History   Not on file  Tobacco Use   Smoking status: Never   Smokeless tobacco: Never  Vaping Use   Vaping Use: Never used  Substance and Sexual Activity   Alcohol use: Never   Drug use: Never   Sexual activity: Not Currently  Other Topics Concern   Not on file  Social History Narrative   Not on file   Social Determinants of Health   Financial Resource Strain: Not on file  Food Insecurity: Not on file  Transportation Needs: Not on file  Physical Activity: Not on file  Stress: Not on file  Social Connections: Not on file  Intimate Partner Violence: Not on file    FAMILY HISTORY: Family History  Problem Relation Age of Onset   Hypertension Mother    Stomach cancer Father    Colon cancer Neg Hx    Colon polyps Neg Hx    Crohn's disease Neg Hx    Esophageal cancer Neg Hx    Rectal cancer Neg Hx     ALLERGIES:  is allergic to  lisinopril.  MEDICATIONS:  Current Outpatient Medications  Medication Sig Dispense Refill   calcium-vitamin D (OSCAL WITH D) 500-5 MG-MCG tablet Take 1 tablet by mouth.     Cholecalciferol 25 MCG (1000 UT) tablet Take 1 tablet (1,000 Units total) by mouth daily. 90 tablet 4   clobetasol cream (TEMOVATE) 4.96 % Apply 1 application topically 2 (two) times daily. Apply to affected area 30 g 2   cromolyn (OPTICROM) 4 % ophthalmic solution Place 1 drop into both eyes 4 (four) times daily. 10 mL 4   diclofenac Sodium (VOLTAREN) 1 % GEL Apply topically as needed.     doxycycline (VIBRA-TABS) 100 MG tablet Take 1/2 tablet daily 30 tablet 1  hydrochlorothiazide (HYDRODIURIL) 12.5 MG tablet Take 1 tablet (12.5 mg total) by mouth daily. 90 tablet 3   ibuprofen (ADVIL) 200 MG tablet Take 200 mg by mouth every 6 (six) hours as needed.     Ivermectin (SOOLANTRA) 1 % CREA Apply a small amount to face every night for rosacea. 45 g 3   Ivermectin (SOOLANTRA) 1 % CREA Apply 1 Application topically at bedtime. 45 g 2   levothyroxine (SYNTHROID) 125 MCG tablet Take 1 tablet (125 mcg total) by mouth daily. 90 tablet 3   metoprolol succinate (TOPROL-XL) 50 MG 24 hr tablet Take 1 tablet (50 mg total) by mouth in the morning and at bedtime. 180 tablet 4   minoxidil (LONITEN) 2.5 MG tablet TAKE 1 TABLET BY MOUTH EVERY DAY 90 tablet 0   olopatadine (PATANOL) 0.1 % ophthalmic solution 1 drop 2 (two) times daily.     OVER THE COUNTER MEDICATION CBD OIL TAKE ONE DROP BEFORE GO TO BED TO HELP WITH SLEEP     pimecrolimus (ELIDEL) 1 % cream Apply to red scaly areas on the face BID 30 g 3   valACYclovir (VALTREX) 1000 MG tablet Take 1 tablet (1,000 mg total) by mouth daily. 90 tablet 4   fluticasone (FLONASE) 50 MCG/ACT nasal spray Place 2 sprays into both nostrils daily. (Patient taking differently: Place 2 sprays into both nostrils as needed.) 16 g 2   omeprazole (PRILOSEC) 40 MG capsule Take 1 capsule (40 mg total) by  mouth daily. (Patient taking differently: Take 40 mg by mouth as needed.) 30 capsule 11   No current facility-administered medications for this visit.    REVIEW OF SYSTEMS:   Pertinent information mentioned in HPI All other systems were reviewed with the patient and are negative.  PHYSICAL EXAMINATION: ECOG PERFORMANCE STATUS: 2 - Symptomatic, <50% confined to bed  Vitals:   12/24/21 1114 12/24/21 1115  BP:  (!) 151/100  Pulse:  64  Resp: 18   Temp:  97.6 F (36.4 C)  SpO2:  97%   Filed Weights   12/24/21 1114  Weight: 192 lb (87.1 kg)    GENERAL:alert, no distress and comfortable SKIN: skin color, texture, turgor are normal, no rashes or significant lesions EYES: normal, conjunctiva are pink and non-injected, sclera clear OROPHARYNX:no exudate, no erythema and lips, buccal mucosa, and tongue normal  NECK: supple, thyroid normal size, non-tender, without nodularity LYMPH:  no palpable lymphadenopathy in the cervical, axillary or inguinal LUNGS: clear to auscultation and percussion with normal breathing effort HEART: regular rate & rhythm and no murmurs and no lower extremity edema ABDOMEN:abdomen soft, non-tender and normal bowel sounds Musculoskeletal:no cyanosis of digits and no clubbing  PSYCH: alert & oriented x 3 with fluent speech NEURO: no focal motor/sensory deficits  LABORATORY DATA:  I have reviewed the data as listed Lab Results  Component Value Date   WBC 6.4 08/21/2021   HGB 15.3 (H) 08/21/2021   HCT 45.4 08/21/2021   MCV 86.1 08/21/2021   PLT 321 08/21/2021   Recent Labs    05/06/21 1207 08/21/21 0906 11/20/21 1451 12/17/21 1048  NA 140 140 143  --   K 4.1 3.7 4.4  --   CL 104 107 104  --   CO2 _0 --   GLUCOSE 82 101* 110*  --   BUN _1 --   CREATININE 0.65 0.75 0.88  --   CALCIUM 9.8 9.6 10.0 8.9  GFRNONAA  --  >60  --   --  PROT 7.3 7.5 7.3  --   ALBUMIN 4.7 4.4 4.7  --   AST _0 --   ALT 36* 24 27  --   ALKPHOS  109 79 92  --   BILITOT 1.0 1.2 1.0  --     RADIOGRAPHIC STUDIES: I have personally reviewed the radiological images as listed and agreed with the findings in the report. No results found.

## 2021-12-25 ENCOUNTER — Encounter: Payer: Self-pay | Admitting: Family Medicine

## 2021-12-25 ENCOUNTER — Other Ambulatory Visit (HOSPITAL_COMMUNITY)
Admission: RE | Admit: 2021-12-25 | Discharge: 2021-12-25 | Disposition: A | Discharge status: Home / Self Care | Payer: Medicaid Other | Source: Ambulatory Visit | Attending: Family Medicine | Admitting: Family Medicine

## 2021-12-25 ENCOUNTER — Ambulatory Visit: Payer: Medicaid Other | Admitting: Family Medicine

## 2021-12-25 DIAGNOSIS — Z124 Encounter for screening for malignant neoplasm of cervix: Secondary | ICD-10-CM

## 2021-12-25 DIAGNOSIS — N904 Leukoplakia of vulva: Secondary | ICD-10-CM | POA: Diagnosis not present

## 2021-12-25 LAB — PTH, INTACT AND CALCIUM: Calcium: 8.9 mg/dL (ref 8.7–10.2)

## 2021-12-25 NOTE — Progress Notes (Signed)
   Subjective:    Patient ID: Tonya Odonnell is a 56 y.o. female presenting with No chief complaint on file.  on 12/25/2021  HPI: Here for repeat pap, reports that she has h/o biopsy and needs f/u. Has been menopausal since 2016 since starting chemo for breast cancer. Has some vaginal itching. Has used Clobetasol but the itching returns if she does not continue to use it.  Review of Systems  Constitutional:  Negative for chills and fever.  Respiratory:  Negative for shortness of breath.   Cardiovascular:  Negative for chest pain.  Gastrointestinal:  Negative for abdominal pain, nausea and vomiting.  Genitourinary:  Negative for dysuria.  Skin:  Negative for rash.      Objective:    LMP  (LMP Unknown)  Physical Exam Exam conducted with a chaperone present.  Constitutional:      General: She is not in acute distress.    Appearance: She is well-developed.  HENT:     Head: Normocephalic and atraumatic.  Eyes:     General: No scleral icterus. Cardiovascular:     Rate and Rhythm: Normal rate.  Pulmonary:     Effort: Pulmonary effort is normal.  Abdominal:     Palpations: Abdomen is soft.  Genitourinary:    Comments: BUS normal, vagina is pale and atrophic, some loss of pigment on the labia minora c/w lichen sclerosis, cervix is nulliparous without lesion, uterus is small and anteverted, no adnexal mass or tenderness.  Musculoskeletal:     Cervical back: Neck supple.  Skin:    General: Skin is warm and dry.  Neurological:     Mental Status: She is alert and oriented to person, place, and time.         Assessment & Plan:   Problem List Items Addressed This Visit       Unprioritized   Lichen sclerosus of female genitalia    Continue clobetasol. Use bid x 6 weeks, qd x 6 weeks, qod x 6 weeks, then 2x/wk going forward.      Other Visit Diagnoses     Screening for cervical cancer    -  Primary   Pap today and manage per ASCCP guidelines   Relevant Orders   Cytology  - PAP( [place])       Return in about 1 year (around 12/26/2022), or if symptoms worsen or fail to improve.  Donnamae Jude, MD 12/25/2021 11:30 AM

## 2021-12-25 NOTE — Assessment & Plan Note (Signed)
Continue clobetasol. Use bid x 6 weeks, qd x 6 weeks, qod x 6 weeks, then 2x/wk going forward.

## 2021-12-30 LAB — CYTOLOGY - PAP
Comment: NEGATIVE
Diagnosis: NEGATIVE
High risk HPV: NEGATIVE

## 2022-01-12 NOTE — Patient Instructions (Signed)

## 2022-01-16 ENCOUNTER — Ambulatory Visit: Payer: Medicaid Other | Admitting: Nurse Practitioner

## 2022-01-16 ENCOUNTER — Encounter: Payer: Self-pay | Admitting: Nurse Practitioner

## 2022-01-16 VITALS — BP 109/76 | HR 75 | Temp 98.3°F | Ht 63.0 in | Wt 190.1 lb

## 2022-01-16 DIAGNOSIS — I10 Essential (primary) hypertension: Secondary | ICD-10-CM

## 2022-01-16 DIAGNOSIS — Z853 Personal history of malignant neoplasm of breast: Secondary | ICD-10-CM

## 2022-01-16 DIAGNOSIS — E063 Autoimmune thyroiditis: Secondary | ICD-10-CM | POA: Diagnosis not present

## 2022-01-16 DIAGNOSIS — E782 Mixed hyperlipidemia: Secondary | ICD-10-CM

## 2022-01-16 NOTE — Assessment & Plan Note (Signed)
Chronic, ongoing. Continue current medication regimen and adjust as needed based on labs.  TSH and Free T4 today.

## 2022-01-16 NOTE — Progress Notes (Signed)
BP 109/76   Pulse 75   Temp 98.3 F (36.8 C) (Oral)   Ht '5\' 3"'$  (1.6 m)   Wt 190 lb 1.6 oz (86.2 kg)   LMP  (LMP Unknown)   SpO2 93%   BMI 33.67 kg/m    Subjective:    Patient ID: Tonya Odonnell, female    DOB: 01/26/1966, 56 y.o.   MRN: [id number]  HPI: Tonya Odonnell is a 56 y.o. female  Chief Complaint  Patient presents with   Hypertension    Patient is here for a four week follow up on Hypertension.    HYPERTENSION  Currently taking Metoprolol XL 50 MG BID and added on HCTZ 12.5 MG at recent visit.  She took it a few times, but does not feel she needs it anymore -- taking only as needed for now.  Feels her BP machines are not working correctly at home.  Had visit with new cancer provider on 12/24/21 at [facility].  She reports they told her since she is done with treatments they do not need to see her anymore.  She goes for annual mammograms.  Would like thyroid labs checked again today. Hypertension status: stable  Satisfied with current treatment? yes Duration of hypertension: chronic BP monitoring frequency:  daily BP range: unsure BP medication side effects:  no Medication compliance: good compliance Aspirin: no Recurrent headaches: no Visual changes: no Palpitations: no Dyspnea: no Chest pain: no Lower extremity edema: no Dizzy/lightheaded: no  The 10-year ASCVD risk score (Arnett DK, et al., 2019) is: 1.7%   Values used to calculate the score:     Age: 71 years     Sex: Female     Is Non-Hispanic African American: No     Diabetic: No     Tobacco smoker: No     Systolic Blood Pressure: 409 mmHg     Is BP treated: Yes     HDL Cholesterol: 76 mg/dL     Total Cholesterol: 232 mg/dL   Relevant past medical, surgical, family and social history reviewed and updated as indicated. Interim medical history since our last visit reviewed. Allergies and medications reviewed and updated.  Review of Systems  Constitutional:  Negative for activity change, appetite change,  diaphoresis, fatigue and fever.  Respiratory:  Negative for cough, chest tightness and shortness of breath.   Cardiovascular:  Negative for chest pain, palpitations and leg swelling.  Gastrointestinal: Negative.   Neurological: Negative.   Psychiatric/Behavioral: Negative.     Per HPI unless specifically indicated above     Objective:    BP 109/76   Pulse 75   Temp 98.3 F (36.8 C) (Oral)   Ht '5\' 3"'$  (1.6 m)   Wt 190 lb 1.6 oz (86.2 kg)   LMP  (LMP Unknown)   SpO2 93%   BMI 33.67 kg/m   Wt Readings from Last 3 Encounters:  01/16/22 190 lb 1.6 oz (86.2 kg)  12/24/21 192 lb (87.1 kg)  12/17/21 189 lb 14.4 oz (86.1 kg)    Physical Exam Vitals and nursing note reviewed.  Constitutional:      General: She is awake. She is not in acute distress.    Appearance: She is well-developed and well-groomed. She is obese. She is not ill-appearing or toxic-appearing.  HENT:     Head: Normocephalic.     Right Ear: Hearing normal.     Left Ear: Hearing normal.  Eyes:     General: Lids are normal.  Right eye: No discharge.        Left eye: No discharge.     Conjunctiva/sclera: Conjunctivae normal.     Pupils: Pupils are equal, round, and reactive to light.  Neck:     Thyroid: No thyromegaly.     Vascular: No carotid bruit.  Cardiovascular:     Rate and Rhythm: Normal rate and regular rhythm.     Heart sounds: Normal heart sounds. No murmur heard.    No gallop.  Pulmonary:     Effort: Pulmonary effort is normal. No accessory muscle usage or respiratory distress.     Breath sounds: Normal breath sounds.  Abdominal:     General: Bowel sounds are normal.     Palpations: Abdomen is soft. There is no hepatomegaly or splenomegaly.  Musculoskeletal:     Cervical back: Normal range of motion and neck supple.     Right lower leg: No edema.     Left lower leg: No edema.  Lymphadenopathy:     Cervical: No cervical adenopathy.  Skin:    General: Skin is warm and dry.   Neurological:     Mental Status: She is alert and oriented to person, place, and time.  Psychiatric:        Attention and Perception: Attention normal.        Mood and Affect: Mood normal.        Speech: Speech normal.        Behavior: Behavior normal. Behavior is cooperative.        Thought Content: Thought content normal.    Results for orders placed or performed in visit on 12/25/21  Cytology - PAP( [place])  Result Value Ref Range   High risk HPV Negative    Adequacy      Satisfactory for evaluation. The presence or absence of an   Adequacy      endocervical/transformation zone component cannot be determined because   Adequacy of atrophy.    Diagnosis      - Negative for intraepithelial lesion or malignancy (NILM)   Comment Normal Reference Range HPV - Negative       Assessment & Plan:   Problem List Items Addressed This Visit       Cardiovascular and Mediastinum   Essential (primary) hypertension - Primary    Chronic, ongoing.  BP well at goal in office today.  Recommend she monitor BP at least a few mornings a week at home and document.  DASH diet at home.  Continue Metoprolol at current dosing and HCTZ 12.5 MG daily as needed.  Labs: CBC, CMP.  Return in 5 months.      Relevant Orders   Comprehensive metabolic panel   CBC with Differential/Platelet     Endocrine   Hashimoto's thyroiditis    Chronic, ongoing. Continue current medication regimen and adjust as needed based on labs.  TSH and Free T4 today.      Relevant Orders   T4, free   TSH     Other   History of breast cancer    History of in 2016 -- continue collaboration with oncology and plastic surgery as needed.  Mammograms annually.      Hyperlipidemia    Chronic, ongoing with ASCVD 1.7%.  Recommend continued focus on diet and regular activity. Lipid panel up to date.        Follow up plan: Return in about 5 months (around 06/17/2022) for HTN/HLD, VIT D, THYROID, ANXIETY, PAIN.

## 2022-01-16 NOTE — Assessment & Plan Note (Signed)
History of in 2016 -- continue collaboration with oncology and plastic surgery as needed.  Mammograms annually.

## 2022-01-16 NOTE — Assessment & Plan Note (Signed)
Chronic, ongoing with ASCVD 1.7%.  Recommend continued focus on diet and regular activity. Lipid panel up to date.

## 2022-01-16 NOTE — Assessment & Plan Note (Signed)
Chronic, ongoing.  BP well at goal in office today.  Recommend she monitor BP at least a few mornings a week at home and document.  DASH diet at home.  Continue Metoprolol at current dosing and HCTZ 12.5 MG daily as needed.  Labs: CBC, CMP.  Return in 5 months.

## 2022-01-17 LAB — COMPREHENSIVE METABOLIC PANEL
ALT: 19 IU/L (ref 0–32)
AST: 18 IU/L (ref 0–40)
Albumin/Globulin Ratio: 1.9 (ref 1.2–2.2)
Albumin: 4.7 g/dL (ref 3.8–4.9)
Alkaline Phosphatase: 88 IU/L (ref 44–121)
BUN/Creatinine Ratio: 22 (ref 9–23)
BUN: 15 mg/dL (ref 6–24)
Bilirubin Total: 1.3 mg/dL — ABNORMAL HIGH (ref 0.0–1.2)
CO2: 20 mmol/L (ref 20–29)
Calcium: 9.3 mg/dL (ref 8.7–10.2)
Chloride: 105 mmol/L (ref 96–106)
Creatinine, Ser: 0.69 mg/dL (ref 0.57–1.00)
Globulin, Total: 2.5 g/dL (ref 1.5–4.5)
Glucose: 92 mg/dL (ref 70–99)
Potassium: 4.5 mmol/L (ref 3.5–5.2)
Sodium: 139 mmol/L (ref 134–144)
Total Protein: 7.2 g/dL (ref 6.0–8.5)
eGFR: 102 mL/min/{1.73_m2} (ref >59)

## 2022-01-17 LAB — CBC WITH DIFFERENTIAL/PLATELET
Basophils Absolute: 0.1 10*3/uL (ref 0.0–0.2)
Basos: 1 %
EOS (ABSOLUTE): 0.1 10*3/uL (ref 0.0–0.4)
Eos: 1 %
Hematocrit: 47.8 % — ABNORMAL HIGH (ref 34.0–46.6)
Hemoglobin: 15.7 g/dL (ref 11.1–15.9)
Immature Grans (Abs): 0 10*3/uL (ref 0.0–0.1)
Immature Granulocytes: 1 %
Lymphocytes Absolute: 3.4 10*3/uL — ABNORMAL HIGH (ref 0.7–3.1)
Lymphs: 43 %
MCH: 29.3 pg (ref 26.6–33.0)
MCHC: 32.8 g/dL (ref 31.5–35.7)
MCV: 89 fL (ref 79–97)
Monocytes Absolute: 0.5 10*3/uL (ref 0.1–0.9)
Monocytes: 6 %
Neutrophils Absolute: 3.8 10*3/uL (ref 1.4–7.0)
Neutrophils: 48 %
Platelets: 363 10*3/uL (ref 150–450)
RBC: 5.36 x10E6/uL — ABNORMAL HIGH (ref 3.77–5.28)
RDW: 13.9 % (ref 11.7–15.4)
WBC: 7.9 10*3/uL (ref 3.4–10.8)

## 2022-01-17 LAB — T4, FREE: Free T4: 2.05 ng/dL — ABNORMAL HIGH (ref 0.82–1.77)

## 2022-01-17 LAB — TSH: TSH: 2.6 u[IU]/mL (ref 0.450–4.500)

## 2022-01-17 NOTE — Progress Notes (Signed)
Contacted via MyChart   Good afternoon Tonya Odonnell, your labs have returned.  TSH remains normal and Free T4 remains mildly elevated, at this time continue current Levothyroxine dosing.  We will monitor as TSH is normal.  Kidney function, creatinine and eGFR, remains normal, as is liver function, AST and ALT.  CBC remains stable.  No changes needed.  Any questions? Keep being awesome!!  Thank you for allowing me to participate in your care.  I appreciate you. Kindest regards, Kawhi Diebold

## 2022-02-20 ENCOUNTER — Inpatient Hospital Stay: Payer: Medicaid Other | Admitting: Adult Health

## 2022-02-23 NOTE — Patient Instructions (Incomplete)
Piriformis Syndrome  Piriformis syndrome is a condition that can cause pain and numbness in your buttocks and down the back of your leg. Piriformis syndrome happens when the small muscle that connects the base of your spine to your hip (piriformis muscle) presses on the nerve that runs down the back of your leg (sciatic nerve). The piriformis muscle helps your hip rotate and helps to bring your leg back and out. It also helps shift your weight to keep you stable while you are walking. The sciatic nerve runs under or through the piriformis muscle. Damage to the piriformis muscle can cause spasms that put pressure on the nerve below. This causes pain and discomfort while sitting and moving. The pain may feel as if it begins in the buttock and spreads (radiates) down your hip and thigh. What are the causes? This condition is caused by pressure on the sciatic nerve from the piriformis muscle. The piriformis muscle can get irritated with overuse, especially if other hip muscles are weak and the piriformis muscle has to do extra work. Piriformis syndrome can also occur after an injury, like a fall onto your buttocks. What increases the risk? You are more likely to develop this condition if you: Are a woman. Sit for long periods of time. Are a cyclist. Have weak buttocks muscles (gluteal muscles). What are the signs or symptoms? Symptoms of this condition include: Pain, tingling, or numbness that starts in the buttock and runs down the back of your leg (sciatica). Pain in the groin or thigh area. Your symptoms may get worse: The longer you sit. When you walk, run, or climb stairs. When straining to have a bowel movement. How is this diagnosed? This condition is diagnosed based on your symptoms, medical history, and physical exam. During the exam, your health care provider may: Move your leg into different positions to check for pain. Press on the muscles of your hip and buttock to see if that  increases your symptoms. You may also have tests, including: Imaging tests such as X-rays, CT, MRI, or ultrasound. Electromyogram (EMG). This test measures electrical signals sent by your nerves into the muscles. Nerve conduction study. This test measures how well electrical signals pass through your nerves. How is this treated? This condition may be treated by: Stopping all activities that cause pain or make your condition worse. Applying ice or using heat therapy. Taking medicines to reduce pain and swelling. Taking a muscle relaxer (muscle relaxant) to stop muscle spasms. Doing range-of-motion and strengthening exercises (physical therapy) as told by your health care provider. Having massage, acupuncture, or local electrical stimulation (transcutaneous electrical nerve stimulation, TENS). Getting an injection of medicine in the piriformis muscle. Your health care provider will choose the medicine based on your condition. He or she may inject: An anti-inflammatory medicine (steroid) to reduce swelling. A numbing medicine (local anesthetic) to block the pain. Botulinum toxin. The toxin blocks nerve impulses to specific muscles to reduce muscle tension. In rare cases, you may need surgery to cut the muscle and release pressure on the nerve if other treatments do not work. Follow these instructions at home: Activity Do not sit for long periods. Get up and walk around every 20 minutes or as often as told by your health care provider. When driving long distances, make sure to take frequent stops to get up and stretch. Use a cushion when you sit on hard surfaces. Do exercises as told by your health care provider. Return to your normal activities as  told by your health care provider. Ask your health care provider what activities are safe for you. Managing pain, stiffness, and swelling     If directed, apply heat to the area as often as told by your health care provider. Use the heat source  that your health care provider recommends, such as a moist heat pack or a heating pad. Place a towel between your skin and the heat source. Leave the heat on for 20-30 minutes. Remove the heat if your skin turns bright red. This is especially important if you are unable to feel pain, heat, or cold. You have a greater risk of getting burned. If directed, put ice on the injured area. To do this: Put ice in a plastic bag. Place a towel between your skin and the bag. Leave the ice on for 20 minutes, 2-3 times a day. Remove the ice if your skin turns bright red. This is very important. If you cannot feel pain, heat, or cold, you have a greater risk of damage to the area. General instructions Take over-the-counter and prescription medicines only as told by your health care provider. Ask your health care provider if the medicine prescribed to you requires you to avoid driving or using machinery. You may need to take these actions to prevent or treat constipation: Drink enough fluid to keep your urine pale yellow. Take over-the-counter or prescription medicines. Eat foods that are high in fiber, such as beans, whole grains, and fresh fruits and vegetables. Limit foods that are high in fat and processed sugars, such as fried or sweet foods. Keep all follow-up visits. This is important. How is this prevented? Do not sit for longer than 20 minutes at a time. When you sit, choose padded surfaces. Warm up and stretch before being active. Cool down and stretch after being active. Contact a health care provider if: Your pain and stiffness continue or get worse. Your leg or hip becomes weak. You have changes in your bowel function or bladder function. Summary Piriformis syndrome is a condition that can cause pain, tingling, and numbness in your buttocks and down the back of your leg. You may try applying heat or ice to relieve the pain. Do not sit for long periods. Get up and walk around every 20  minutes or as often as told by your health care provider. This information is not intended to replace advice given to you by your health care provider. Make sure you discuss any questions you have with your health care provider. Document Revised: 08/20/2020 Document Reviewed: 08/20/2020 Elsevier Patient Education  [place].

## 2022-02-24 ENCOUNTER — Ambulatory Visit: Payer: Medicaid Other | Admitting: Nurse Practitioner

## 2022-02-25 ENCOUNTER — Ambulatory Visit: Payer: Medicaid Other | Admitting: Dermatology

## 2022-02-25 VITALS — BP 152/97 | HR 85

## 2022-02-25 DIAGNOSIS — L219 Seborrheic dermatitis, unspecified: Secondary | ICD-10-CM

## 2022-02-25 DIAGNOSIS — L659 Nonscarring hair loss, unspecified: Secondary | ICD-10-CM

## 2022-02-25 DIAGNOSIS — L719 Rosacea, unspecified: Secondary | ICD-10-CM

## 2022-02-25 MED ORDER — FLUOCINOLONE ACETONIDE 0.01 % OT OIL: TOPICAL_OIL | OTIC | 4 refills | Status: DC

## 2022-02-25 MED ORDER — MINOXIDIL 2.5 MG PO TABS: 2.5000 mg | ORAL_TABLET | Freq: Every day | ORAL | 1 refills | Status: DC

## 2022-02-25 MED ORDER — IVERMECTIN 1 % EX CREA: TOPICAL_CREAM | CUTANEOUS | 4 refills | Status: DC

## 2022-02-25 MED ORDER — PIMECROLIMUS 1 % EX CREA: TOPICAL_CREAM | CUTANEOUS | 4 refills | Status: DC

## 2022-02-25 NOTE — Progress Notes (Signed)
Follow-Up Visit   Subjective  Tonya Odonnell is a 56 y.o. female who presents for the following: Alopecia (Patient reports some new growth but still shedding. Has been taking minoxidil 2.5 mg 1/2 qd. Would like to discuss how long she will need to take. Reports some abnormal thyroid levels and recently increase thyroid medication. ), Rosacea (Patient has been using ivermectin cream. Reports still flared. ), and Seborrheic Dermatitis (Patient reports itchy scales at ears. Has been using Elidel cream to affected areas of face. ).  The patient has spots, moles and lesions to be evaluated, some may be new or changing and the patient has concerns that these could be cancer.   The following portions of the chart were reviewed this encounter and updated as appropriate:      Review of Systems: No other skin or systemic complaints except as noted in HPI or Assessment and Plan.   Objective  Well appearing patient in no apparent distress; mood and affect are within normal limits.  A focused examination was performed including scalp and face. Relevant physical exam findings are noted in the Assessment and Plan.  Scalp Diffuse thinning of the crown and widening of the midline part with retention of the frontal hairline - Reviewed progressive nature and prognosis. Midline part appears stable to improved when compared to baseline photo  Head - Anterior (Face) Mild erythema mid face  face and ears Pink patches with greasy scale ears.  Pink scaliness face    Assessment & Plan  Alopecia Scalp  Chronic and persistent condition with duration or expected duration over one year. Condition is symptomatic/ bothersome to patient. 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  Has had some abnormal thyroid labs, and currently adjusting medication.  Reviewed recent labs from November 2023 -  normal  Recommend continuing minoxidil 2.5 mg tablet take 1/2 tab by mouth daily    Continue Minoxidil 2.'5mg'$  1/2 tab po QD. Doses of minoxidil for hair loss are considered 'low dose'. This is because the doses used for hair loss are a lot lower than the doses which are used for conditions such as high blood pressure (hypertension). The doses used for hypertension are 10-'40mg'$  per day.  Side effects are uncommon at the low doses (up to 2.5 mg/day) used to treat hair loss. Potential side effects, more commonly seen at higher doses, include: Increase in hair growth (hypertrichosis) elsewhere on face and body Temporary hair shedding upon starting medication which may last up to 4 weeks Ankle swelling, fluid retention, rapid weight gain more than 5 pounds Low blood pressure and feeling lightheaded or dizzy when standing up quickly Fast or irregular heartbeat Headaches   Discussed that she may D/C oral minoxidil at any time, but that hair density may return to baseline if hair thinning is primarily due to androgenic alopecia.  She will need to stay on the medication to maintain results.   BP today  - 152/97  minoxidil (LONITEN) 2.5 MG tablet - Scalp Take 1 tablet (2.5 mg total) by mouth daily.  Rosacea Head - Anterior (Face)  Chronic and persistent condition with duration or expected duration over one year. Condition is symptomatic/ bothersome to patient. Not currently at goal.    Rosacea is a chronic progressive skin condition usually affecting the face of adults, causing redness and/or acne bumps. It is treatable but not curable. It sometimes affects the eyes (ocular  rosacea) as well. It may respond to topical and/or systemic medication and can flare with stress, sun exposure, alcohol, exercise and some foods.  Daily application of broad spectrum spf 30+ sunscreen to face is recommended to reduce flares.     Continue Ivermectin cream Qhs to aas face.   Continue sun screen daily to  face    Ivermectin (SOOLANTRA) 1 % CREA - Head - Anterior (Face) Apply a small amount to face every night for rosacea.  Seborrheic dermatitis face and ears  Chronic and persistent condition with duration or expected duration over one year. Condition is symptomatic/ bothersome to patient. Not currently at goal.   Seborrheic Dermatitis  -  is a chronic persistent rash characterized by pinkness and scaling most commonly of the mid face but also can occur on the scalp (dandruff), ears; mid chest, mid back and groin.  It tends to be exacerbated by stress and cooler weather.  People who have neurologic disease may experience new onset or exacerbation of existing seborrheic dermatitis.  The condition is not curable but treatable and can be controlled.  Continue Elidel 1 % cream apply to red scaly areas on face qd/bid prn  Start Dermotic 0.01% oil - apply topically to aa of ears qd/bid prn for flares of seb derm   Fluocinolone Acetonide (DERMOTIC) 0.01 % OIL - face and ears Apply topically to aa of ears qd/bid prn for flares with seb derm  pimecrolimus (ELIDEL) 1 % cream - face and ears Apply to red scaly areas on the face BID for seb derm   Return in about 6 months (around 08/27/2022) for alopecia/rosacea followup. I, Ruthell Rummage, CMA, am acting as scribe for Brendolyn Patty, MD.  Documentation: I have reviewed the above documentation for accuracy and completeness, and I agree with the above.  Brendolyn Patty MD

## 2022-02-25 NOTE — Patient Instructions (Addendum)
Continue minoxidil 2.5 mg tab by mouth take 1/2 tab by mouth daily   Continue elidel 1 % cream - apply to red scaly areas on the face twice daily for seborrheic dermatitis  Continue Ivermectin 1 % cream - apply a small amount to face every night for rosacea   Start dermotic 0.01 % oil - apply topically to affected areas of ears daily to twice daily as needed for flares with seborrheic dermatitis.      Doses of minoxidil for hair loss are considered 'low dose'. This is because the doses used for hair loss are a lot lower than the doses which are used for conditions such as high blood pressure (hypertension). The doses used for hypertension are 10-'40mg'$  per day.  Side effects are uncommon at the low doses (up to 2.5 mg/day) used to treat hair loss. Potential side effects, more commonly seen at higher doses, include: Increase in hair growth (hypertrichosis) elsewhere on face and body Temporary hair shedding upon starting medication which may last up to 4 weeks Ankle swelling, fluid retention, rapid weight gain more than 5 pounds Low blood pressure and feeling lightheaded or dizzy when standing up quickly Fast or irregular heartbeat Headaches   Due to recent changes in healthcare laws, you may see results of your pathology and/or laboratory studies on MyChart before the doctors have had a chance to review them. We understand that in some cases there may be results that are confusing or concerning to you. Please understand that not all results are received at the same time and often the doctors may need to interpret multiple results in order to provide you with the best plan of care or course of treatment. Therefore, we ask that you please give [place] 2 business days to thoroughly review all your results before contacting the office for clarification. Should we see a critical lab result, you will be contacted sooner.   If You Need Anything After Your Visit  If you have any questions or concerns for  your doctor, please call our main line at [phone] and press option 4 to reach your doctor's medical assistant. If no one answers, please leave a voicemail as directed and we will return your call as soon as possible. Messages left after 4 pm will be answered the following business day.   You may also send [place] a message via [place]. We typically respond to MyChart messages within 1-2 business days.  For prescription refills, please ask your pharmacy to contact our office. Our fax number is [phone].  If you have an urgent issue when the clinic is closed that cannot wait until the next business day, you can page your doctor at the number below.    Please note that while we do our best to be available for urgent issues outside of office hours, we are not available 24/7.   If you have an urgent issue and are unable to reach [place], you may choose to seek medical care at your doctor's office, retail clinic, urgent care center, or emergency room.  If you have a medical emergency, please immediately call 911 or go to the emergency department.  Pager Numbers  - Dr. Nehemiah Massed: [phone]  - Dr. Laurence Ferrari: [phone]  - Dr. Nicole Kindred: [phone]  In the event of inclement weather, please call our main line at [phone] for an update on the status of any delays or closures.  Dermatology Medication Tips: Please keep the boxes that topical medications come in in order to help  keep track of the instructions about where and how to use these. Pharmacies typically print the medication instructions only on the boxes and not directly on the medication tubes.   If your medication is too expensive, please contact our office at [phone] option 4 or send [place] a message through [place].   We are unable to tell what your co-pay for medications will be in advance as this is different depending on your insurance coverage. However, we may be able to find a substitute medication at lower cost or fill out  paperwork to get insurance to cover a needed medication.   If a prior authorization is required to get your medication covered by your insurance company, please allow [place] 1-2 business days to complete this process.  Drug prices often vary depending on where the prescription is filled and some pharmacies may offer cheaper prices.  The website www.goodrx.com contains coupons for medications through different pharmacies. The prices here do not account for what the cost may be with help from insurance (it may be cheaper with your insurance), but the website can give you the price if you did not use any insurance.  - You can print the associated coupon and take it with your prescription to the pharmacy.  - You may also stop by our office during regular business hours and pick up a GoodRx coupon card.  - If you need your prescription sent electronically to a different pharmacy, notify our office through [facility] or by phone at [phone] option 4.     Si Usted Necesita Algo Despus de Su Visita  Tambin puede enviarnos un mensaje a travs de Pharmacist, community. Por lo general respondemos a los mensajes de MyChart en el transcurso de 1 a 2 das hbiles.  Para renovar recetas, por favor pida a su farmacia que se ponga en contacto con nuestra oficina. Harland Dingwall de fax es [place] [phone].  Si tiene un asunto urgente cuando la clnica est cerrada y que no puede esperar hasta el siguiente da hbil, puede llamar/localizar a su doctor(a) al nmero que aparece a continuacin.   Por favor, tenga en cuenta que aunque hacemos todo lo posible para estar disponibles para asuntos urgentes fuera del horario de [place], no estamos disponibles las 24 horas del da, los 7 das de la [place].   Si tiene un problema urgente y no puede comunicarse con nosotros, puede optar por buscar atencin mdica  en el consultorio de su doctor(a), en una clnica privada, en un centro de atencin urgente o en una sala de  emergencias.  Si tiene Engineering geologist, por favor llame inmediatamente al 911 o vaya a la sala de emergencias.  Nmeros de bper  - Dr. Nehemiah Massed: [phone]  - Dra. Moye: [phone]  - Dra. Nicole Kindred: [phone]  En caso de inclemencias del [place], por favor llame a Johnsie Kindred principal al [phone] para una actualizacin sobre el [place] de cualquier retraso o cierre.  Consejos para la medicacin en dermatologa: Por favor, guarde las cajas en las que vienen los medicamentos de uso tpico para ayudarle a seguir las instrucciones sobre dnde y cmo usarlos. Las farmacias generalmente imprimen las instrucciones del medicamento slo en las cajas y no directamente en los tubos del [place].   Si su medicamento es muy caro, por favor, pngase en contacto con Zigmund Daniel llamando al [phone] y presione la opcin 4 o envenos un mensaje a travs de Pharmacist, community.   No podemos decirle cul ser su copago por los  medicamentos por adelantado ya que esto es diferente dependiendo de la cobertura de su seguro. Sin embargo, es posible que podamos encontrar un medicamento sustituto a Electrical engineer un formulario para que el seguro cubra el medicamento que se considera necesario.   Si se requiere una autorizacin previa para que su compaa de seguros [place] su medicamento, por favor permtanos de 1 a 2 das hbiles para completar este proceso.  Los precios de los medicamentos varan con frecuencia dependiendo del Environmental consultant de dnde se surte la receta y alguna farmacias pueden ofrecer precios ms baratos.  El sitio web www.goodrx.com tiene cupones para medicamentos de Airline pilot. Los precios aqu no tienen en cuenta lo que podra costar con la ayuda del seguro (puede ser ms barato con su seguro), pero el sitio web puede darle el precio si no utiliz Research scientist (physical sciences).  - Puede imprimir el cupn correspondiente y llevarlo con su receta a la farmacia.  - Tambin puede pasar por  nuestra oficina durante el horario de atencin regular y Charity fundraiser una tarjeta de cupones de GoodRx.  - Si necesita que su receta se enve electrnicamente a una farmacia diferente, informe a nuestra oficina a travs de MyChart de [place] o por telfono llamando al [phone] y presione la opcin 4.

## 2022-03-04 ENCOUNTER — Telehealth: Payer: Self-pay | Admitting: Nurse Practitioner

## 2022-03-04 NOTE — Telephone Encounter (Signed)
Copied from [place] [phone]. Topic: Appointment Scheduling - Scheduling Inquiry for Clinic >> Mar 04, 2022  9:30 AM Devoria Glassing wrote: Reason for CRM: pt would like you to send her info on her mychart about the orders for her xray. [place] address and phone number. She is new to area.

## 2022-03-05 ENCOUNTER — Ambulatory Visit
Admission: RE | Admit: 2022-03-05 | Discharge: 2022-03-05 | Disposition: A | Discharge status: Home / Self Care | Payer: Medicaid Other | Source: Ambulatory Visit | Attending: Nurse Practitioner | Admitting: Nurse Practitioner

## 2022-03-05 ENCOUNTER — Encounter: Payer: Self-pay | Admitting: Nurse Practitioner

## 2022-03-05 DIAGNOSIS — M25551 Pain in right hip: Secondary | ICD-10-CM | POA: Diagnosis not present

## 2022-03-05 DIAGNOSIS — M25561 Pain in right knee: Secondary | ICD-10-CM | POA: Insufficient documentation

## 2022-03-05 DIAGNOSIS — G8929 Other chronic pain: Secondary | ICD-10-CM | POA: Insufficient documentation

## 2022-03-05 DIAGNOSIS — M25562 Pain in left knee: Secondary | ICD-10-CM | POA: Insufficient documentation

## 2022-03-05 DIAGNOSIS — M25552 Pain in left hip: Secondary | ICD-10-CM | POA: Diagnosis not present

## 2022-03-05 NOTE — Progress Notes (Signed)
Contacted via MyChart   Good evening Tonya Odonnell, your imaging has returned there is no joint space narrowing to hips or pelvic area this is good news.  However, there is some degenerative spurring to pelvic area.  This could cause discomfort.  You may benefit from physical therapy if pain ongoing.  Would you like to pursue this? Keep being amazing!!  Thank you for allowing me to participate in your care.  I appreciate you. Kindest regards, Kajal Scalici

## 2022-03-19 NOTE — Patient Instructions (Addendum)
[facility] Address: [address], [place], [place] [address] Hours:  Closed ? Opens 7:30?AM Mon Phone: [phone]  [facility] Address: [address], [place],  [address] Hours:  Open ? Closes 4?PM Phone: [phone]  Healthy Eating Following a healthy eating pattern may help you to achieve and maintain a healthy body weight, reduce the risk of chronic disease, and live a long and productive life. It is important to follow a healthy eating pattern at an appropriate calorie level for your body. Your nutritional needs should be met primarily through food by choosing a variety of nutrient-rich foods. What are tips for following this plan? Reading food labels Read labels and choose the following: Reduced or low sodium. Juices with 100% fruit juice. Foods with low saturated fats and high polyunsaturated and monounsaturated fats. Foods with whole grains, such as whole wheat, cracked wheat, brown rice, and wild rice. Whole grains that are fortified with folic acid. This is recommended for women who are pregnant or who want to become pregnant. Read labels and avoid the following: Foods with a lot of added sugars. These include foods that contain brown sugar, corn sweetener, corn syrup, dextrose, fructose, glucose, high-fructose corn syrup, honey, invert sugar, lactose, malt syrup, maltose, molasses, raw sugar, sucrose, trehalose, or turbinado sugar. Do not eat more than the following amounts of added sugar per day: 6 teaspoons (25 g) for women. 9 teaspoons (38 g) for men. Foods that contain processed or refined starches and grains. Refined grain products, such as white flour, degermed cornmeal, white bread, and white rice. Shopping Choose nutrient-rich snacks, such as vegetables, whole fruits, and nuts. Avoid high-calorie and high-sugar snacks, such as potato chips, fruit snacks, and candy. Use oil-based dressings and spreads on foods instead of solid fats such as  butter, stick margarine, or cream cheese. Limit pre-made sauces, mixes, and "instant" products such as flavored rice, instant noodles, and ready-made pasta. Try more plant-protein sources, such as tofu, tempeh, black beans, edamame, lentils, nuts, and seeds. Explore eating plans such as the Mediterranean diet or vegetarian diet. Cooking Use oil to saut or stir-fry foods instead of solid fats such as butter, stick margarine, or lard. Try baking, boiling, grilling, or broiling instead of frying. Remove the fatty part of meats before cooking. Steam vegetables in water or broth. Meal planning  At meals, imagine dividing your plate into fourths: One-half of your plate is fruits and vegetables. One-fourth of your plate is whole grains. One-fourth of your plate is protein, especially lean meats, poultry, eggs, tofu, beans, or nuts. Include low-fat dairy as part of your daily diet. Lifestyle Choose healthy options in all settings, including home, work, school, restaurants, or stores. Prepare your food safely: Wash your hands after handling raw meats. Keep food preparation surfaces clean by regularly washing with hot, soapy water. Keep raw meats separate from ready-to-eat foods, such as fruits and vegetables. Cook seafood, meat, poultry, and eggs to the recommended internal temperature. Store foods at safe temperatures. In general: Keep cold foods at 13F (4.4C) or below. Keep hot foods at 113F (60C) or above. Keep your freezer at [facility] (-17.8C) or below. Foods are no longer safe to eat when they have been between the temperatures of 40-113F (4.4-60C) for more than 2 hours. What foods should I eat? Fruits Aim to eat 2 cup-equivalents of fresh, canned (in natural juice), or frozen fruits each day. Examples of 1 cup-equivalent of fruit include 1 small apple, 8 large strawberries, 1 cup canned  fruit,  cup dried fruit, or 1 cup 100% juice. Vegetables Aim to eat 2-3 cup-equivalents of fresh  and frozen vegetables each day, including different varieties and colors. Examples of 1 cup-equivalent of vegetables include 2 medium carrots, 2 cups raw, leafy greens, 1 cup chopped vegetable (raw or cooked), or 1 medium baked potato. Grains Aim to eat 6 ounce-equivalents of whole grains each day. Examples of 1 ounce-equivalent of grains include 1 slice of bread, 1 cup ready-to-eat cereal, 3 cups popcorn, or  cup cooked rice, pasta, or cereal. Meats and other proteins Aim to eat 5-6 ounce-equivalents of protein each day. Examples of 1 ounce-equivalent of protein include 1 egg, 1/2 cup nuts or seeds, or 1 tablespoon (16 g) peanut butter. A cut of meat or fish that is the size of a deck of cards is about 3-4 ounce-equivalents. Of the protein you eat each week, try to have at least 8 ounces come from seafood. This includes salmon, trout, herring, and anchovies. Dairy Aim to eat 3 cup-equivalents of fat-free or low-fat dairy each day. Examples of 1 cup-equivalent of dairy include 1 cup (240 mL) milk, 8 ounces (250 g) yogurt, 1 ounces (44 g) natural cheese, or 1 cup (240 mL) fortified soy milk. Fats and oils Aim for about 5 teaspoons (21 g) per day. Choose monounsaturated fats, such as canola and olive oils, avocados, peanut butter, and most nuts, or polyunsaturated fats, such as sunflower, corn, and soybean oils, walnuts, pine nuts, sesame seeds, sunflower seeds, and flaxseed. Beverages Aim for six 8-oz glasses of water per day. Limit coffee to three to five 8-oz cups per day. Limit caffeinated beverages that have added calories, such as soda and energy drinks. Limit alcohol intake to no more than 1 drink a day for nonpregnant women and 2 drinks a day for men. One drink equals 12 oz of beer (355 mL), 5 oz of wine (148 mL), or 1 oz of hard liquor (44 mL). Seasoning and other foods Avoid adding excess amounts of salt to your foods. Try flavoring foods with herbs and spices instead of salt. Avoid adding  sugar to foods. Try using oil-based dressings, sauces, and spreads instead of solid fats. This information is based on general [place] nutrition guidelines. For more information, visit [employer].es. Exact amounts may vary based on your nutrition needs. Summary A healthy eating plan may help you to maintain a healthy weight, reduce the risk of chronic diseases, and stay active throughout your life. Plan your meals. Make sure you eat the right portions of a variety of nutrient-rich foods. Try baking, boiling, grilling, or broiling instead of frying. Choose healthy options in all settings, including home, work, school, restaurants, or stores. This information is not intended to replace advice given to you by your health care provider. Make sure you discuss any questions you have with your health care provider. Document Revised: 08/07/2021 Document Reviewed: 10/23/2020 Elsevier Patient Education  [place].

## 2022-03-21 ENCOUNTER — Encounter: Payer: Self-pay | Admitting: Nurse Practitioner

## 2022-03-21 ENCOUNTER — Ambulatory Visit: Payer: Medicaid Other | Admitting: Nurse Practitioner

## 2022-03-21 VITALS — BP 132/80 | HR 69 | Temp 98.0°F | Ht 62.99 in | Wt 193.6 lb

## 2022-03-21 DIAGNOSIS — D3502 Benign neoplasm of left adrenal gland: Secondary | ICD-10-CM

## 2022-03-21 DIAGNOSIS — M545 Low back pain, unspecified: Secondary | ICD-10-CM

## 2022-03-21 LAB — WET PREP FOR TRICH, YEAST, CLUE
Clue Cell Exam: NEGATIVE
Trichomonas Exam: NEGATIVE
Yeast Exam: NEGATIVE

## 2022-03-21 LAB — URINALYSIS, ROUTINE W REFLEX MICROSCOPIC
Bilirubin, UA: NEGATIVE
Glucose, UA: NEGATIVE
Ketones, UA: NEGATIVE
Nitrite, UA: NEGATIVE
Protein,UA: NEGATIVE
RBC, UA: NEGATIVE
Specific Gravity, UA: 1.015 (ref 1.005–1.030)
Urobilinogen, Ur: 0.2 mg/dL (ref 0.2–1.0)
pH, UA: 5 (ref 5.0–7.5)

## 2022-03-21 LAB — MICROSCOPIC EXAMINATION
Bacteria, UA: NONE SEEN
RBC, Urine: NONE SEEN /hpf (ref 0–2)

## 2022-03-21 MED ORDER — OMEPRAZOLE 40 MG PO CPDR: 40.0000 mg | DELAYED_RELEASE_CAPSULE | Freq: Every day | ORAL | 12 refills | Status: DC

## 2022-03-21 MED ORDER — TIZANIDINE HCL 2 MG PO TABS: 2.0000 mg | ORAL_TABLET | Freq: Four times a day (QID) | ORAL | 0 refills | Status: DC | PRN

## 2022-03-21 MED ORDER — LIDOCAINE 4 % EX PTCH: 1.0000 | MEDICATED_PATCH | CUTANEOUS | 0 refills | Status: DC

## 2022-03-21 MED ORDER — PREDNISONE 10 MG PO TABS: ORAL_TABLET | ORAL | 0 refills | Status: DC

## 2022-03-21 NOTE — Progress Notes (Signed)
BP 132/80 (BP Location: Left Arm, Patient Position: Sitting)   Pulse 69   Temp 98 F (36.7 C) (Oral)   Ht 5' 2.99" (1.6 m)   Wt 193 lb 9.6 oz (87.8 kg)   LMP  (LMP Unknown)   SpO2 93%   BMI 34.30 kg/m    Subjective:    Patient ID: Tonya Odonnell, female    DOB: [birth year], 57 y.o.   MRN: [id number]  HPI: Tonya Odonnell is a 57 y.o. female  Chief Complaint  Patient presents with   Back Pain    Lower back for past month   BACK PAIN Left lower back pain in SI area, waxing and waning over past months.  Started again yesterday.  Sharp pain to SI area and can not even place massage tool on area.  Ibuprofen taken and did help.  She was noted to have a 10 mm adrenal gland nodule -- likely a benign adenoma with recommendation for repeat abdominal CT in 6 months. Duration: months Mechanism of injury: unknown Location: Left lower back Onset: gradual Severity: 10/10 Quality: sharp, aching, shooting, and throbbing Frequency: constant Radiation: buttocks Aggravating factors: movement and bending Alleviating factors: rest and NSAIDs, massage Status: fluctuating Treatments attempted: rest, heat, and ibuprofen  Relief with NSAIDs?: moderate Nighttime pain:   when moves only Paresthesias / decreased sensation:  no Bowel / bladder incontinence:  no Fevers:  no Dysuria / urinary frequency:  no   Relevant past medical, surgical, family and social history reviewed and updated as indicated. Interim medical history since our last visit reviewed. Allergies and medications reviewed and updated.  Review of Systems  Constitutional:  Negative for activity change, appetite change, chills, fatigue and fever.  Respiratory: Negative.    Cardiovascular: Negative.   Musculoskeletal:  Positive for back pain.  Neurological: Negative.   Psychiatric/Behavioral: Negative.     Per HPI unless specifically indicated above     Objective:    BP 132/80 (BP Location: Left Arm, Patient Position: Sitting)    Pulse 69   Temp 98 F (36.7 C) (Oral)   Ht 5' 2.99" (1.6 m)   Wt 193 lb 9.6 oz (87.8 kg)   LMP  (LMP Unknown)   SpO2 93%   BMI 34.30 kg/m   Wt Readings from Last 3 Encounters:  03/21/22 193 lb 9.6 oz (87.8 kg)  01/16/22 190 lb 1.6 oz (86.2 kg)  12/24/21 192 lb (87.1 kg)    Physical Exam Vitals and nursing note reviewed.  Constitutional:      General: She is awake. She is not in acute distress.    Appearance: She is well-developed and well-groomed. She is obese. She is not ill-appearing or toxic-appearing.  HENT:     Head: Normocephalic.     Right Ear: Hearing and external ear normal.     Left Ear: Hearing and external ear normal.  Eyes:     General: Lids are normal.        Right eye: No discharge.        Left eye: No discharge.     Conjunctiva/sclera: Conjunctivae normal.     Pupils: Pupils are equal, round, and reactive to light.  Neck:     Thyroid: No thyromegaly.     Vascular: No carotid bruit.  Cardiovascular:     Rate and Rhythm: Normal rate and regular rhythm.     Heart sounds: Normal heart sounds. No murmur heard.    No gallop.  Pulmonary:     Effort:  Pulmonary effort is normal. No accessory muscle usage or respiratory distress.     Breath sounds: Normal breath sounds.  Abdominal:     General: Bowel sounds are normal.     Palpations: Abdomen is soft. There is no hepatomegaly or splenomegaly.  Musculoskeletal:     Cervical back: Normal range of motion and neck supple.     Lumbar back: Tenderness present. No swelling, spasms or bony tenderness. Decreased range of motion. Negative right straight leg raise test and negative left straight leg raise test.     Right lower leg: No edema.     Left lower leg: No edema.     Comments: Tenderness to left SI joint.  No rashes noted.  Decreased flexion and extension + lateral movement.  Antalgic gait. Using cane per baseline.  Lymphadenopathy:     Cervical: No cervical adenopathy.  Skin:    General: Skin is warm and dry.   Neurological:     Mental Status: She is alert and oriented to person, place, and time.  Psychiatric:        Attention and Perception: Attention normal.        Mood and Affect: Mood normal.        Speech: Speech normal.        Behavior: Behavior normal. Behavior is cooperative.        Thought Content: Thought content normal.    Results for orders placed or performed in visit on 03/21/22  WET PREP FOR [place], YEAST, CLUE   Specimen: Urine   Urine  Result Value Ref Range   Trichomonas Exam Negative Negative   Yeast Exam Negative Negative   Clue Cell Exam Negative Negative  Microscopic Examination   Urine  Result Value Ref Range   WBC, UA 0-5 0 - 5 /hpf   RBC, Urine None seen 0 - 2 /hpf   Epithelial Cells (non renal) 0-10 0 - 10 /hpf   Bacteria, UA None seen None seen/Few  Urinalysis, Routine w reflex microscopic  Result Value Ref Range   Specific Gravity, UA 1.015 1.005 - 1.030   pH, UA 5.0 5.0 - 7.5   Color, UA Yellow Yellow   Appearance Ur Clear Clear   Leukocytes,UA 1+ (A) Negative   Protein,UA Negative Negative/Trace   Glucose, UA Negative Negative   Ketones, UA Negative Negative   RBC, UA Negative Negative   Bilirubin, UA Negative Negative   Urobilinogen, Ur 0.2 0.2 - 1.0 mg/dL   Nitrite, UA Negative Negative   Microscopic Examination See below:       Assessment & Plan:   Problem List Items Addressed This Visit       Endocrine   Adrenal adenoma, left    Order for repeat CT scan place for reassessment of area.      Relevant Orders   CT Abdomen Pelvis Wo Contrast     Other   Acute left-sided low back pain without sciatica - Primary    Acute flare of back pain.  UA and wet prep negative today.  Will repeat lumbar imaging.  Pain mainly to left SI area and lower back.  Start Prednisone taper + sent in Lidocaine patches and Tizanidine.  Recommend Tylenol as needed + alternate heat and ice.  Consider PT if ongoing.  Provided her information on local chiropractor  offices to call.      Relevant Medications   predniSONE (DELTASONE) 10 MG tablet   tiZANidine (ZANAFLEX) 2 MG tablet   Other Relevant Orders  WET PREP FOR [place], YEAST, CLUE (Completed)   Urinalysis, Routine w reflex microscopic (Completed)   DG Lumbar Spine Complete     Follow up plan: Return in about 2 weeks (around 04/04/2022) for BACK PAIN.

## 2022-03-21 NOTE — Assessment & Plan Note (Signed)
Order for repeat CT scan place for reassessment of area.

## 2022-03-21 NOTE — Assessment & Plan Note (Signed)
Acute flare of back pain.  UA and wet prep negative today.  Will repeat lumbar imaging.  Pain mainly to left SI area and lower back.  Start Prednisone taper + sent in Lidocaine patches and Tizanidine.  Recommend Tylenol as needed + alternate heat and ice.  Consider PT if ongoing.  Provided her information on local chiropractor offices to call.

## 2022-03-24 ENCOUNTER — Ambulatory Visit
Admission: RE | Admit: 2022-03-24 | Discharge: 2022-03-24 | Disposition: A | Discharge status: Home / Self Care | Payer: Medicaid Other | Attending: Nurse Practitioner | Admitting: Nurse Practitioner

## 2022-03-24 ENCOUNTER — Ambulatory Visit
Admission: RE | Admit: 2022-03-24 | Discharge: 2022-03-24 | Disposition: A | Discharge status: Home / Self Care | Payer: Medicaid Other | Source: Ambulatory Visit | Attending: Nurse Practitioner | Admitting: Nurse Practitioner

## 2022-03-24 DIAGNOSIS — M545 Low back pain, unspecified: Secondary | ICD-10-CM | POA: Diagnosis not present

## 2022-03-25 NOTE — Progress Notes (Signed)
Contacted via MyChart   Good morning Tonya Odonnell, your imaging returned and lumbar spine is showing no acute findings.  I would say if pain continues you delve further and pursue MRI imaging + physical therapy in future.  Any questions? Keep being amazing!!  Thank you for allowing me to participate in your care.  I appreciate you. Kindest regards, Toshiro Hanken

## 2022-03-30 NOTE — Patient Instructions (Signed)
Chronic Back Pain When back pain lasts longer than 3 months, it is called chronic back pain. Pain may get worse at certain times (flare-ups). There are things you can do at home to manage your pain. Follow these instructions at home: Pay attention to any changes in your symptoms. Take these actions to help with your pain: Managing pain and stiffness     If told, put ice on the painful area. Your doctor may tell you to use ice for 24-48 hours after the flare-up starts. To do this: Put ice in a plastic bag. Place a towel between your skin and the bag. Leave the ice on for 20 minutes, 2-3 times a day. If told, put heat on the painful area. Do this as often as told by your doctor. Use the heat source that your doctor recommends, such as a moist heat pack or a heating pad. Place a towel between your skin and the heat source. Leave the heat on for 20-30 minutes. Take off the heat if your skin turns bright red. This is especially important if you are unable to feel pain, heat, or cold. You may have a greater risk of getting burned. Soak in a warm bath. This can help relieve pain. Activity  Avoid bending and other activities that make pain worse. When standing: Keep your upper back and neck straight. Keep your shoulders pulled back. Avoid slouching. When sitting: Keep your back straight. Relax your shoulders. Do not round your shoulders or pull them backward. Do not sit or stand in one place for long periods of time. Take short rest breaks during the day. Lying down or standing is usually better than sitting. Resting can help relieve pain. When sitting or lying down for a long time, do some mild activity or stretching. This will help to prevent stiffness and pain. Get regular exercise. Ask your doctor what activities are safe for you. Do not lift anything that is heavier than 10 lb (4.5 kg) or the limit that you are told, until your doctor says that it is safe. To prevent injury when you lift  things: Bend your knees. Keep the weight close to your body. Avoid twisting. Sleep on a firm mattress. Try lying on your side with your knees slightly bent. If you lie on your back, put a pillow under your knees. Medicines Treatment may include medicines for pain and swelling taken by mouth or put on the skin, prescription pain medicine, or muscle relaxants. Take over-the-counter and prescription medicines only as told by your doctor. Ask your doctor if the medicine prescribed to you: Requires you to avoid driving or using machinery. Can cause trouble pooping (constipation). You may need to take these actions to prevent or treat trouble pooping: Drink enough fluid to keep your pee (urine) pale yellow. Take over-the-counter or prescription medicines. Eat foods that are high in fiber. These include beans, whole grains, and fresh fruits and vegetables. Limit foods that are high in fat and sugars. These include fried or sweet foods. General instructions Do not use any products that contain nicotine or tobacco, such as cigarettes, e-cigarettes, and chewing tobacco. If you need help quitting, ask your doctor. Keep all follow-up visits as told by your doctor. This is important. Contact a doctor if: Your pain does not get better with rest or medicine. Your pain gets worse, or you have new pain. You have a high fever. You lose weight very quickly. You have trouble doing your normal activities. Get help right away  if: One or both of your legs or feet feel weak. One or both of your legs or feet lose feeling (have numbness). You have trouble controlling when you poop (have a bowel movement) or pee (urinate). You have bad back pain and: You feel like you may vomit (nauseous), or you vomit. You have pain in your belly (abdomen). You have shortness of breath. You faint. Summary When back pain lasts longer than 3 months, it is called chronic back pain. Pain may get worse at certain times  (flare-ups). Use ice and heat as told by your doctor. Your doctor may tell you to use ice after flare-ups. This information is not intended to replace advice given to you by your health care provider. Make sure you discuss any questions you have with your health care provider. Document Revised: 04/06/2019 Document Reviewed: 04/06/2019 Elsevier Patient Education  [place].

## 2022-04-04 ENCOUNTER — Encounter: Payer: Self-pay | Admitting: Nurse Practitioner

## 2022-04-04 ENCOUNTER — Ambulatory Visit: Payer: Medicaid Other | Admitting: Nurse Practitioner

## 2022-04-04 VITALS — BP 130/85 | HR 82 | Temp 98.2°F | Ht 62.99 in | Wt 193.3 lb

## 2022-04-04 DIAGNOSIS — M545 Low back pain, unspecified: Secondary | ICD-10-CM | POA: Diagnosis not present

## 2022-04-04 NOTE — Progress Notes (Signed)
BP 130/85   Pulse 82   Temp 98.2 F (36.8 C) (Oral)   Ht 5' 2.99" (1.6 m)   Wt 193 lb 4.8 oz (87.7 kg)   LMP  (LMP Unknown)   SpO2 96%   BMI 34.25 kg/m    Subjective:    Patient ID: Tonya Odonnell, female    DOB: 28-Sep-1965, 57 y.o.   MRN: [id number]  HPI: Tonya Odonnell is a 57 y.o. female  Chief Complaint  Patient presents with   Back Pain    Follow up , Patient states that back pain has resolved.    BACK PAIN Follow-up for lower back pain, she reports this has resolved at this time.  Was treated on 03/21/22 with Prednisone taper + Lidocaine patches and Tizanidine.  Left lower back pain in SI area, waxing and waning over past months.  Ibuprofen taken and did help.  Tried to see chiropractor who takes her insurance, but unable to find one locally.  Had imaging on 03/24/22 which showed no acute findings.  History of chemo treatment for breast CA -- has chronic pain after this = has taken Lyrica (was not good for her -- made her crazy she reports), Nortriptyline, Gabapentin, Duloxetine.    She was noted to have a 10 mm adrenal gland nodule -- likely a benign adenoma with recommendation for repeat abdominal CT in 6 months -- this is scheduled for 04/09/22.   Duration: months -- improved at this time Mechanism of injury: unknown Location: Left lower back Onset: gradual Severity: 0/10 today Quality: sharp, aching, shooting, and throbbing Frequency: constant Radiation: buttocks Aggravating factors: movement and bending Alleviating factors: rest and NSAIDs, massage, Prednisone taper Status: improved Treatments attempted: rest, heat, and ibuprofen , Prednisone, Lidocaine patches Relief with NSAIDs?: moderate Nighttime pain:   when moves only Paresthesias / decreased sensation:  no Bowel / bladder incontinence:  no Fevers:  no Dysuria / urinary frequency:  no   Relevant past medical, surgical, family and social history reviewed and updated as indicated. Interim medical history since  our last visit reviewed. Allergies and medications reviewed and updated.  Review of Systems  Constitutional:  Negative for activity change, appetite change, chills, fatigue and fever.  Respiratory: Negative.    Cardiovascular: Negative.   Musculoskeletal:  Negative for back pain.  Neurological: Negative.   Psychiatric/Behavioral: Negative.     Per HPI unless specifically indicated above     Objective:    BP 130/85   Pulse 82   Temp 98.2 F (36.8 C) (Oral)   Ht 5' 2.99" (1.6 m)   Wt 193 lb 4.8 oz (87.7 kg)   LMP  (LMP Unknown)   SpO2 96%   BMI 34.25 kg/m   Wt Readings from Last 3 Encounters:  04/04/22 193 lb 4.8 oz (87.7 kg)  03/21/22 193 lb 9.6 oz (87.8 kg)  01/16/22 190 lb 1.6 oz (86.2 kg)    Physical Exam Vitals and nursing note reviewed.  Constitutional:      General: She is awake. She is not in acute distress.    Appearance: She is well-developed and well-groomed. She is obese. She is not ill-appearing or toxic-appearing.  HENT:     Head: Normocephalic.     Right Ear: Hearing and external ear normal.     Left Ear: Hearing and external ear normal.  Eyes:     General: Lids are normal.        Right eye: No discharge.  Left eye: No discharge.     Conjunctiva/sclera: Conjunctivae normal.     Pupils: Pupils are equal, round, and reactive to light.  Neck:     Thyroid: No thyromegaly.     Vascular: No carotid bruit.  Cardiovascular:     Rate and Rhythm: Normal rate and regular rhythm.     Heart sounds: Normal heart sounds. No murmur heard.    No gallop.  Pulmonary:     Effort: Pulmonary effort is normal. No accessory muscle usage or respiratory distress.     Breath sounds: Normal breath sounds.  Abdominal:     General: Bowel sounds are normal.     Palpations: Abdomen is soft. There is no hepatomegaly or splenomegaly.  Musculoskeletal:     Cervical back: Normal range of motion and neck supple.     Lumbar back: No swelling, spasms, tenderness or bony  tenderness. Normal range of motion. Negative right straight leg raise test and negative left straight leg raise test.     Right lower leg: No edema.     Left lower leg: No edema.     Comments: No rashes noted.   Lymphadenopathy:     Cervical: No cervical adenopathy.  Skin:    General: Skin is warm and dry.  Neurological:     Mental Status: She is alert and oriented to person, place, and time.  Psychiatric:        Attention and Perception: Attention normal.        Mood and Affect: Mood normal.        Speech: Speech normal.        Behavior: Behavior normal. Behavior is cooperative.        Thought Content: Thought content normal.    Results for orders placed or performed in visit on 03/21/22  WET PREP FOR [place], YEAST, CLUE   Specimen: Urine   Urine  Result Value Ref Range   Trichomonas Exam Negative Negative   Yeast Exam Negative Negative   Clue Cell Exam Negative Negative  Microscopic Examination   Urine  Result Value Ref Range   WBC, UA 0-5 0 - 5 /hpf   RBC, Urine None seen 0 - 2 /hpf   Epithelial Cells (non renal) 0-10 0 - 10 /hpf   Bacteria, UA None seen None seen/Few  Urinalysis, Routine w reflex microscopic  Result Value Ref Range   Specific Gravity, UA 1.015 1.005 - 1.030   pH, UA 5.0 5.0 - 7.5   Color, UA Yellow Yellow   Appearance Ur Clear Clear   Leukocytes,UA 1+ (A) Negative   Protein,UA Negative Negative/Trace   Glucose, UA Negative Negative   Ketones, UA Negative Negative   RBC, UA Negative Negative   Bilirubin, UA Negative Negative   Urobilinogen, Ur 0.2 0.2 - 1.0 mg/dL   Nitrite, UA Negative Negative   Microscopic Examination See below:       Assessment & Plan:   Problem List Items Addressed This Visit       Other   Acute left-sided low back pain without sciatica - Primary    Acute and improved at this time.  Monitor closely for further acute flares.  Treat as needed.        Follow up plan: Return for as scheduled.

## 2022-04-04 NOTE — Assessment & Plan Note (Signed)
Acute and improved at this time.  Monitor closely for further acute flares.  Treat as needed.

## 2022-04-05 ENCOUNTER — Encounter: Payer: Self-pay | Admitting: Internal Medicine

## 2022-04-07 ENCOUNTER — Other Ambulatory Visit: Payer: Self-pay | Admitting: *Deleted

## 2022-04-07 ENCOUNTER — Telehealth: Payer: Self-pay | Admitting: Adult Health

## 2022-04-07 ENCOUNTER — Other Ambulatory Visit: Payer: Self-pay

## 2022-04-07 DIAGNOSIS — C50412 Malignant neoplasm of upper-outer quadrant of left female breast: Secondary | ICD-10-CM

## 2022-04-07 NOTE — Telephone Encounter (Signed)
Scheduled appointment per patients request via telephone call. Patient is aware of the made appointment.

## 2022-04-08 ENCOUNTER — Ambulatory Visit (HOSPITAL_COMMUNITY)
Admission: RE | Admit: 2022-04-08 | Discharge: 2022-04-08 | Disposition: A | Discharge status: Home / Self Care | Payer: Medicaid Other | Source: Ambulatory Visit | Attending: Adult Health | Admitting: Adult Health

## 2022-04-08 ENCOUNTER — Encounter: Payer: Self-pay | Admitting: Adult Health

## 2022-04-08 ENCOUNTER — Inpatient Hospital Stay: Payer: Medicaid Other | Attending: Adult Health | Admitting: Adult Health

## 2022-04-08 ENCOUNTER — Telehealth: Payer: Self-pay | Admitting: Nurse Practitioner

## 2022-04-08 ENCOUNTER — Other Ambulatory Visit: Payer: Self-pay

## 2022-04-08 VITALS — BP 154/89 | HR 73 | Temp 97.7°F | Resp 16 | Ht 62.0 in | Wt 193.1 lb

## 2022-04-08 DIAGNOSIS — M19012 Primary osteoarthritis, left shoulder: Secondary | ICD-10-CM | POA: Diagnosis not present

## 2022-04-08 DIAGNOSIS — Z170 Estrogen receptor positive status [ER+]: Secondary | ICD-10-CM | POA: Insufficient documentation

## 2022-04-08 DIAGNOSIS — Z79811 Long term (current) use of aromatase inhibitors: Secondary | ICD-10-CM | POA: Insufficient documentation

## 2022-04-08 DIAGNOSIS — M25512 Pain in left shoulder: Secondary | ICD-10-CM

## 2022-04-08 DIAGNOSIS — M542 Cervicalgia: Secondary | ICD-10-CM | POA: Insufficient documentation

## 2022-04-08 DIAGNOSIS — Z9012 Acquired absence of left breast and nipple: Secondary | ICD-10-CM | POA: Diagnosis not present

## 2022-04-08 DIAGNOSIS — Z853 Personal history of malignant neoplasm of breast: Secondary | ICD-10-CM | POA: Insufficient documentation

## 2022-04-08 DIAGNOSIS — C50912 Malignant neoplasm of unspecified site of left female breast: Secondary | ICD-10-CM | POA: Insufficient documentation

## 2022-04-08 NOTE — Progress Notes (Signed)
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, DCIS low-grade, lymphovascular invasion present, 1/2 lymph nodes positive ER positive greater than 95%, PR positive greater than 95%, HER2 equivocal 2+ by IHC and FISH was negative (Dr.Lo)   01/17/2015 Surgery   Left mastectomy (done because of close margins): Residual LCIS, 1/5 lymph nodes positive (total 2/10 lymph nodes positive)   01/17/2015 Oncotype testing   Oncotype DX score: 20   2016 - 2017 Chemotherapy   Adjuvant chemotherapy (patient is not sure of what drugs she received but that she had 8 doses)   07/28/2015 -  Anti-estrogen oral therapy   Patient was on and off antiestrogen therapy since May 2017.  Unable to tolerate tamoxifen or Zoladex.  Eventually after she became menopausal, started on Arimidex January 2021 (takes Cymbalta for hot flashes). Reports discontinued in Dec 2022 as advised by Oncologist in [place].      CURRENT THERAPY: observation  INTERVAL HISTORY: Tonya Odonnell 57 y.o. female returns for f/u and evaluation of left shoulder pain. She is s/p left mastectomy in 2016.  Her 10 year ascvd risk calculated at 3.7% (low)  She notes pain inside her left shoulder that radiates to her neck.  It started about a week ago and she describes it as a pulsing pain that feels like a line.  She says the pain is constant.  She was previously on prednisone taper for lower back pain in mid January that has since resolved.  Notes that when she received the  prednisone she felt really good and a lot of her symptoms improved and she also felt like the generalized swelling she had resolved temporarily.  Patient Active Problem List   Diagnosis Date Noted   Acute left-sided low back pain without sciatica 19/14/7829   Lichen sclerosus of female genitalia 12/25/2021   Adrenal adenoma, left 09/17/2021   Pain management contract signed 06/13/2021   Chronic pain syndrome 06/03/2021   Chronic arthralgias of knees and hips 05/06/2021   Osteopenia of lumbar spine 05/05/2021   Obesity 05/05/2021   Anxiety 12/05/2020   Gallstones 12/05/2020   Gastroesophageal reflux disease without esophagitis 12/05/2020   Prediabetes 12/05/2020   Hyperlipidemia 02/08/2018   Hepatitis B core antibody positive 08/07/2017   History of breast cancer 12/13/2014   Chronic low back pain 02/17/2014   Fibromyalgia 10/26/2013   Adverse reaction to influenza vaccine 11/22/2012   Essential (primary) hypertension 05/05/2012   Hashimoto's thyroiditis 05/05/2012    is allergic to lisinopril.  MEDICAL HISTORY: Past Medical History:  Diagnosis Date   Anemia    "USE TO HAVE IT,NOT NOW,2015,2016   Arthritis    HIPS,KNEES,HANDS   Breast cancer in female ([place])    Cancer ([facility])    LEFT   Hyperlipidemia    Hypertension    Neuromuscular disorder ([place])    MUSCLE PAIN,FIBROMYALGIA   Osteopenia    PVC (premature ventricular contraction)    Thyroid disease     SURGICAL HISTORY: Past Surgical  History:  Procedure Laterality Date   Left Ovarian removal Left    MASTECTOMY Left    "LYMPHNODES REMOVED    SOCIAL HISTORY: Social History   Socioeconomic History   Marital status: Married    Spouse name: Not on file   Number of children: Not on file   Years of education: Not on file   Highest education level: Not on file  Occupational History   Occupation: disabled  Tobacco Use   Smoking status: Never   Smokeless tobacco: Never  Vaping Use   Vaping Use: Never used   Substance and Sexual Activity   Alcohol use: Never   Drug use: Never   Sexual activity: Not Currently  Other Topics Concern   Not on file  Social History Narrative   Not on file   Social Determinants of Health   Financial Resource Strain: Not on file  Food Insecurity: Not on file  Transportation Needs: Not on file  Physical Activity: Not on file  Stress: Not on file  Social Connections: Not on file  Intimate Partner Violence: Not on file    FAMILY HISTORY: Family History  Problem Relation Age of Onset   Hypertension Mother    Leukemia Mother    Stomach cancer Father    Colon cancer Neg Hx    Colon polyps Neg Hx    Crohn's disease Neg Hx    Esophageal cancer Neg Hx    Rectal cancer Neg Hx     Review of Systems  Constitutional:  Negative for appetite change, chills, fatigue, fever and unexpected weight change.  HENT:   Negative for hearing loss, lump/mass and trouble swallowing.   Eyes:  Negative for eye problems and icterus.  Respiratory:  Negative for chest tightness, cough and shortness of breath.   Cardiovascular:  Negative for chest pain, leg swelling and palpitations.  Gastrointestinal:  Negative for abdominal distention, abdominal pain, constipation, diarrhea, nausea and vomiting.  Endocrine: Negative for hot flashes.  Genitourinary:  Negative for difficulty urinating.   Musculoskeletal:  Negative for arthralgias.  Skin:  Negative for itching and rash.  Neurological:  Negative for dizziness, extremity weakness, headaches and numbness.  Hematological:  Negative for adenopathy. Does not bruise/bleed easily.  Psychiatric/Behavioral:  Negative for depression. The patient is not nervous/anxious.     PHYSICAL EXAMINATION  ECOG PERFORMANCE STATUS: 1 - Symptomatic but completely ambulatory  Vitals:   04/08/22 1158  BP: (!) 154/89  Pulse: 73  Resp: 16  Temp: 97.7 F (36.5 C)  SpO2: 97%    Physical Exam Constitutional:      General: She is not in acute  distress.    Appearance: Normal appearance. She is not toxic-appearing.  HENT:     Head: Normocephalic and atraumatic.  Eyes:     General: No scleral icterus. Cardiovascular:     Rate and Rhythm: Normal rate and regular rhythm.     Pulses: Normal pulses.     Heart sounds: Normal heart sounds.  Pulmonary:     Effort: Pulmonary effort is normal.     Breath sounds: Normal breath sounds.  Abdominal:     General: Abdomen is flat. Bowel sounds are normal. There is no distension.     Palpations: Abdomen is soft.     Tenderness: There is no abdominal tenderness.  Musculoskeletal:        General: Tenderness (Cervical spine is tender to palpation along with sternocleidomastoid muscle trapezius muscle and the acromion.) present. No swelling.  Cervical back: Neck supple.  Lymphadenopathy:     Cervical: No cervical adenopathy.  Skin:    General: Skin is warm and dry.     Findings: No rash.  Neurological:     General: No focal deficit present.     Mental Status: She is alert.  Psychiatric:        Mood and Affect: Mood normal.        Behavior: Behavior normal.     LABORATORY DATA: None for this visit   ASSESSMENT and THERAPY PLAN:   History of breast cancer Tonya Odonnell is a 57 year old woman here today for follow-up of her history of breast cancer and left shoulder pain.  We discussed her shoulder pain in detail today.  We also reviewed the fact that her previous back pain and vague myalgia improved with prednisone and she felt really good.  I recommended that she undergo x-rays of the left shoulder and cervical spine.  She has an appointment with [facility] rheumatology on April 10, 2022.  I am interested in their thoughts around improvement in her symptoms and the potential etiologies.  Because of her upcoming appointment with rheumatology I do not want to put her on any new medications.  She is managing the pain well with the Voltaren gel and will continue to use this for now.  We will  touch base in a week to discuss her shoulder pain, review her rheumatology consultation, and discuss next steps.    All questions were answered. The patient knows to call the clinic with any problems, questions or concerns. We can certainly see the patient much sooner if necessary.  Total encounter time:30 minutes*in face-to-face visit time, chart review, lab review, care coordination, order entry, and documentation of the encounter time.    Wilber Bihari, NP 04/08/22 1:14 PM Medical Oncology and Hematology [facility] [place], [place] [address] Tel. [phone]    Fax. [phone]  *Total Encounter Time as defined by the Centers for Medicare and Medicaid Services includes, in addition to the face-to-face time of a patient visit (documented in the note above) non-face-to-face time: obtaining and reviewing outside history, ordering and reviewing medications, tests or procedures, care coordination (communications with other health care professionals or caregivers) and documentation in the medical record.

## 2022-04-08 NOTE — Telephone Encounter (Signed)
They are working on an appeal

## 2022-04-08 NOTE — Telephone Encounter (Signed)
Called patient and informed her that CT was denied

## 2022-04-08 NOTE — Assessment & Plan Note (Signed)
Tonya Odonnell is a 57 year old woman here today for follow-up of her history of breast cancer and left shoulder pain.  We discussed her shoulder pain in detail today.  We also reviewed the fact that her previous back pain and vague myalgia improved with prednisone and she felt really good.  I recommended that she undergo x-rays of the left shoulder and cervical spine.  She has an appointment with [facility] rheumatology on April 10, 2022.  I am interested in their thoughts around improvement in her symptoms and the potential etiologies.  Because of her upcoming appointment with rheumatology I do not want to put her on any new medications.  She is managing the pain well with the Voltaren gel and will continue to use this for now.  We will touch base in a week to discuss her shoulder pain, review her rheumatology consultation, and discuss next steps.

## 2022-04-08 NOTE — Telephone Encounter (Signed)
Copied from [place] [phone]. Topic: Referral - Question >> Apr 08, 2022 11:07 AM Tiffany B wrote: Caller wanted to inform, patient CT was denied. As per referral coordinator she is working on appeal. Patient is scheduled for a CT on 1/31 at 10:30 am.

## 2022-04-09 ENCOUNTER — Telehealth: Payer: Self-pay

## 2022-04-09 ENCOUNTER — Ambulatory Visit: Payer: Medicaid Other

## 2022-04-09 ENCOUNTER — Other Ambulatory Visit: Payer: Self-pay

## 2022-04-09 DIAGNOSIS — L219 Seborrheic dermatitis, unspecified: Secondary | ICD-10-CM

## 2022-04-09 MED ORDER — ELIDEL 1 % EX CREA: TOPICAL_CREAM | CUTANEOUS | 4 refills | Status: DC

## 2022-04-09 NOTE — Telephone Encounter (Signed)
Called and given below message. She verbalized understanding and appreciated the call.

## 2022-04-09 NOTE — Progress Notes (Signed)
PA approved for brand name Elidel until 08/2022. aw

## 2022-04-09 NOTE — Telephone Encounter (Signed)
-----  Message from Gardenia Phlegm, NP sent at 04/09/2022  9:08 AM EST ----- Please let patient know xrays show arthritis  ----- Message ----- From: Interface, Rad Results In Sent: 04/08/2022   8:55 PM EST To: Gardenia Phlegm, NP

## 2022-04-10 ENCOUNTER — Telehealth: Payer: Self-pay | Admitting: Adult Health

## 2022-04-10 DIAGNOSIS — M797 Fibromyalgia: Secondary | ICD-10-CM | POA: Diagnosis not present

## 2022-04-10 DIAGNOSIS — I890 Lymphedema, not elsewhere classified: Secondary | ICD-10-CM | POA: Diagnosis not present

## 2022-04-10 DIAGNOSIS — Z6834 Body mass index (BMI) 34.0-34.9, adult: Secondary | ICD-10-CM | POA: Diagnosis not present

## 2022-04-10 DIAGNOSIS — E6609 Other obesity due to excess calories: Secondary | ICD-10-CM | POA: Diagnosis not present

## 2022-04-10 NOTE — Telephone Encounter (Signed)
Scheduled appointment per 1/30 los. Left voicemail.

## 2022-04-15 ENCOUNTER — Other Ambulatory Visit: Payer: Self-pay

## 2022-04-15 ENCOUNTER — Encounter: Payer: Self-pay | Admitting: Adult Health

## 2022-04-15 ENCOUNTER — Telehealth: Payer: Self-pay

## 2022-04-15 ENCOUNTER — Inpatient Hospital Stay: Payer: Medicaid Other | Attending: Adult Health | Admitting: Adult Health

## 2022-04-15 VITALS — BP 145/101 | HR 89 | Temp 97.9°F | Resp 20 | Wt 192.2 lb

## 2022-04-15 DIAGNOSIS — Z79811 Long term (current) use of aromatase inhibitors: Secondary | ICD-10-CM | POA: Diagnosis not present

## 2022-04-15 DIAGNOSIS — M25512 Pain in left shoulder: Secondary | ICD-10-CM | POA: Insufficient documentation

## 2022-04-15 DIAGNOSIS — Z170 Estrogen receptor positive status [ER+]: Secondary | ICD-10-CM | POA: Diagnosis not present

## 2022-04-15 DIAGNOSIS — M79622 Pain in left upper arm: Secondary | ICD-10-CM | POA: Insufficient documentation

## 2022-04-15 DIAGNOSIS — Z9012 Acquired absence of left breast and nipple: Secondary | ICD-10-CM | POA: Diagnosis not present

## 2022-04-15 DIAGNOSIS — Z853 Personal history of malignant neoplasm of breast: Secondary | ICD-10-CM | POA: Diagnosis not present

## 2022-04-15 DIAGNOSIS — C50912 Malignant neoplasm of unspecified site of left female breast: Secondary | ICD-10-CM | POA: Diagnosis not present

## 2022-04-15 DIAGNOSIS — Z9221 Personal history of antineoplastic chemotherapy: Secondary | ICD-10-CM | POA: Diagnosis not present

## 2022-04-15 NOTE — Telephone Encounter (Signed)
Spoke with patient and made aware of breast ultrasound appointment at [facility] in [place] for February 7th at 11:00am. Address given also as requested. She is aware of follow up phone visit with Wilber Bihari NP on February 12th at 3:15pm to discuss results.

## 2022-04-15 NOTE — Progress Notes (Signed)
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, DCIS low-grade, lymphovascular invasion present, 1/2 lymph nodes positive ER positive greater than 95%, PR positive greater than 95%, HER2 equivocal 2+ by IHC and FISH was negative (Dr.Lo)   01/17/2015 Surgery   Left mastectomy (done because of close margins): Residual LCIS, 1/5 lymph nodes positive (total 2/10 lymph nodes positive)   01/17/2015 Oncotype testing   Oncotype DX score: 20   2016 - 2017 Chemotherapy   Adjuvant chemotherapy (patient is not sure of what drugs she received but that she had 8 doses)   07/28/2015 -  Anti-estrogen oral therapy   Patient was on and off antiestrogen therapy since May 2017.  Unable to tolerate tamoxifen or Zoladex.  Eventually after she became menopausal, started on Arimidex January 2021 (takes Cymbalta for hot flashes). Reports discontinued in Dec 2022 as advised by Oncologist in [place].      CURRENT THERAPY: observation  INTERVAL HISTORY: Tonya Odonnell 57 y.o. female returns for follow-up of her left sided breast and shoulder pain.  She was evaluated by rheumatology last week.  She was diagnosed with fibromyalgia.  Her x-rays that she had of her shoulder were negative for cancer but did show some arthritis.  She is very concerned about having a breast cancer recurrence due to the discomfort she is experiencing and how it is similar to how she felt when she was initially diagnosed with breast cancer.  Patient Active Problem List    Diagnosis Date Noted   Acute left-sided low back pain without sciatica 81/85/6314   Lichen sclerosus of female genitalia 12/25/2021   Adrenal adenoma, left 09/17/2021   Pain management contract signed 06/13/2021   Chronic pain syndrome 06/03/2021   Chronic arthralgias of knees and hips 05/06/2021   Osteopenia of lumbar spine 05/05/2021   Obesity 05/05/2021   Anxiety 12/05/2020   Gallstones 12/05/2020   Gastroesophageal reflux disease without esophagitis 12/05/2020   Prediabetes 12/05/2020   Hyperlipidemia 02/08/2018   Hepatitis B core antibody positive 08/07/2017   History of breast cancer 12/13/2014   Chronic low back pain 02/17/2014   Fibromyalgia 10/26/2013   Adverse reaction to influenza vaccine 11/22/2012   Essential (primary) hypertension 05/05/2012   Hashimoto's thyroiditis 05/05/2012    is allergic to lisinopril.  MEDICAL HISTORY: Past Medical History:  Diagnosis Date   Anemia    "USE TO HAVE IT,NOT NOW,2015,2016   Arthritis    HIPS,KNEES,HANDS   Breast cancer in female ([place])    Cancer ([place])    LEFT   Hyperlipidemia    Hypertension    Neuromuscular disorder ([place])    MUSCLE PAIN,FIBROMYALGIA   Osteopenia    PVC (premature ventricular contraction)    Thyroid disease     SURGICAL HISTORY: Past Surgical History:  Procedure Laterality Date   Left Ovarian removal Left    MASTECTOMY Left    "LYMPHNODES REMOVED    SOCIAL HISTORY: Social History   Socioeconomic History   Marital status: Married  Spouse name: Not on file   Number of children: Not on file   Years of education: Not on file   Highest education level: Not on file  Occupational History   Occupation: disabled  Tobacco Use   Smoking status: Never   Smokeless tobacco: Never  Vaping Use   Vaping Use: Never used  Substance and Sexual Activity   Alcohol use: Never   Drug use: Never   Sexual activity: Not Currently  Other Topics Concern   Not on file  Social History Narrative   Not on  file   Social Determinants of Health   Financial Resource Strain: Not on file  Food Insecurity: Not on file  Transportation Needs: Not on file  Physical Activity: Not on file  Stress: Not on file  Social Connections: Not on file  Intimate Partner Violence: Not on file    FAMILY HISTORY: Family History  Problem Relation Age of Onset   Hypertension Mother    Leukemia Mother    Stomach cancer Father    Colon cancer Neg Hx    Colon polyps Neg Hx    Crohn's disease Neg Hx    Esophageal cancer Neg Hx    Rectal cancer Neg Hx     Review of Systems  Constitutional:  Positive for fatigue. Negative for appetite change, chills, fever and unexpected weight change.  HENT:   Negative for hearing loss, lump/mass and trouble swallowing.   Eyes:  Negative for eye problems and icterus.  Respiratory:  Negative for chest tightness, cough and shortness of breath.   Cardiovascular:  Negative for chest pain, leg swelling and palpitations.  Gastrointestinal:  Negative for abdominal distention, abdominal pain, constipation, diarrhea, nausea and vomiting.  Endocrine: Negative for hot flashes.  Genitourinary:  Negative for difficulty urinating.   Musculoskeletal:  Positive for arthralgias and back pain.  Skin:  Negative for itching and rash.  Neurological:  Negative for dizziness, extremity weakness, headaches and numbness.  Hematological:  Negative for adenopathy. Does not bruise/bleed easily.  Psychiatric/Behavioral:  Negative for depression. The patient is nervous/anxious.       PHYSICAL EXAMINATION  ECOG PERFORMANCE STATUS: 1 - Symptomatic but completely ambulatory  Vitals:   04/15/22 1050  BP: (!) 145/101  Pulse: 89  Resp: 20  Temp: 97.9 F (36.6 C)  SpO2: 96%    Physical Exam Constitutional:      General: She is not in acute distress.    Appearance: Normal appearance. She is not toxic-appearing.  HENT:     Head: Normocephalic and atraumatic.  Eyes:     General: No scleral  icterus. Cardiovascular:     Rate and Rhythm: Normal rate and regular rhythm.     Pulses: Normal pulses.     Heart sounds: Normal heart sounds.  Pulmonary:     Effort: Pulmonary effort is normal.     Breath sounds: Normal breath sounds.  Chest:     Comments: Right breast is benign left breast status postmastectomy and reconstruction no sign of local recurrence left axilla is benign. Abdominal:     General: Abdomen is flat. Bowel sounds are normal. There is no distension.     Palpations: Abdomen is soft.     Tenderness: There is no abdominal tenderness.  Musculoskeletal:        General: No swelling.     Cervical back: Neck supple.  Lymphadenopathy:     Cervical: No cervical adenopathy.  Skin:    General: Skin is warm and dry.  Findings: No rash.  Neurological:     General: No focal deficit present.     Mental Status: She is alert.  Psychiatric:        Mood and Affect: Mood normal.        Behavior: Behavior normal.     LABORATORY DATA:  None for this visit   ASSESSMENT and THERAPY PLAN:   History of breast cancer Tonya Odonnell is a 57 year old woman here today for follow-up of her history of breast cancer and left shoulder pain.  She is very concerned about the possibility of breast cancer recurrence.  I placed orders for a left breast and axillary ultrasound to further evaluate for potential recurrence.    If that is negative she may very well benefit from physical therapy or referral to neurosurgery.  My nurse is going to call [facility] and get the ultrasound scheduled.  We will follow-up with the patient after we receive those results.  All questions were answered. The patient knows to call the clinic with any problems, questions or concerns. We can certainly see the patient much sooner if necessary.  Total encounter time:30 minutes*in face-to-face visit time, chart review, lab review, care coordination, order entry, and documentation of the encounter time.    Wilber Bihari, NP 04/15/22 4:22 PM Medical Oncology and Hematology [facility] [place], [place] [address] Tel. [phone]    Fax. [phone]  *Total Encounter Time as defined by the Centers for Medicare and Medicaid Services includes, in addition to the face-to-face time of a patient visit (documented in the note above) non-face-to-face time: obtaining and reviewing outside history, ordering and reviewing medications, tests or procedures, care coordination (communications with other health care professionals or caregivers) and documentation in the medical record.

## 2022-04-15 NOTE — Telephone Encounter (Signed)
Breast ultrasound order faxed to [facility] in [place] at [phone]. Fax Confirmation received.

## 2022-04-15 NOTE — Assessment & Plan Note (Signed)
Rosali is a 57 year old woman here today for follow-up of her history of breast cancer and left shoulder pain.  She is very concerned about the possibility of breast cancer recurrence.  I placed orders for a left breast and axillary ultrasound to further evaluate for potential recurrence.    If that is negative she may very well benefit from physical therapy or referral to neurosurgery.  My nurse is going to call [facility] and get the ultrasound scheduled.  We will follow-up with the patient after we receive those results.

## 2022-04-16 DIAGNOSIS — M79622 Pain in left upper arm: Secondary | ICD-10-CM | POA: Diagnosis not present

## 2022-04-16 DIAGNOSIS — Z9012 Acquired absence of left breast and nipple: Secondary | ICD-10-CM | POA: Diagnosis not present

## 2022-04-21 ENCOUNTER — Telehealth: Payer: Self-pay | Admitting: Adult Health

## 2022-04-21 ENCOUNTER — Inpatient Hospital Stay (HOSPITAL_BASED_OUTPATIENT_CLINIC_OR_DEPARTMENT_OTHER): Payer: Medicaid Other | Admitting: Adult Health

## 2022-04-21 DIAGNOSIS — Z853 Personal history of malignant neoplasm of breast: Secondary | ICD-10-CM | POA: Diagnosis not present

## 2022-04-21 DIAGNOSIS — M25511 Pain in right shoulder: Secondary | ICD-10-CM

## 2022-04-21 NOTE — Progress Notes (Signed)
[facility] Follow up:    Tonya Lick, NP Tonya Odonnell [id number]  I connected with Tonya Odonnell on 04/22/22 at  3:15 PM EST by telephone and verified that I am speaking with the correct person using two identifiers.  I discussed the limitations, risks, security and privacy concerns of performing an evaluation and management service by telephone and the availability of in person appointments.  I also discussed with the patient that there may be a patient responsible charge related to this service. The patient expressed understanding and agreed to proceed.  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, DCIS low-grade, lymphovascular invasion present, 1/2 lymph nodes positive ER positive greater than 95%, PR positive greater than 95%, HER2 equivocal 2+ by IHC and FISH was negative (Dr.Lo)   01/17/2015 Surgery   Left mastectomy (done because of close margins): Residual LCIS, 1/5 lymph nodes positive (total 2/10 lymph nodes positive)   01/17/2015 Oncotype testing   Oncotype DX score: 20   2016 - 2017 Chemotherapy   Adjuvant chemotherapy (patient is not sure of what drugs she received but that she had 8 doses)   07/28/2015 -  Anti-estrogen oral therapy   Patient was on and off antiestrogen therapy since May 2017.  Unable to tolerate tamoxifen or Zoladex.  Eventually after she became menopausal, started on Arimidex January 2021 (takes Cymbalta for hot flashes). Reports discontinued in Dec 2022 as advised by Oncologist in [place].       CURRENT THERAPY: observation  INTERVAL HISTORY: Tonya Odonnell 57 y.o. female returns for f/u to review her left breast ultrasound results.  They were negative for cancer recurrence.  She still has left shoulder pain and is applying voltaren gel to the area.  She is worried there is cancer in her bones and what to do about the area moving forward.     Patient Active Problem List   Diagnosis Date Noted   Acute left-sided low back pain without sciatica 0000000   Lichen sclerosus of female genitalia 12/25/2021   Adrenal adenoma, left 09/17/2021   Pain management contract signed 06/13/2021   Chronic pain syndrome 06/03/2021   Chronic arthralgias of knees and hips 05/06/2021   Osteopenia of lumbar spine 05/05/2021   Obesity 05/05/2021   Anxiety 12/05/2020   Gallstones 12/05/2020   Gastroesophageal reflux disease without esophagitis 12/05/2020   Prediabetes 12/05/2020   Hyperlipidemia 02/08/2018   Hepatitis B core antibody positive 08/07/2017   History of breast cancer 12/13/2014   Chronic low back pain 02/17/2014   Fibromyalgia 10/26/2013   Adverse reaction to influenza vaccine 11/22/2012   Essential (primary) hypertension 05/05/2012   Hashimoto's thyroiditis 05/05/2012    is allergic to lisinopril.  MEDICAL HISTORY: Past Medical History:  Diagnosis Date   Anemia    "USE TO HAVE IT,NOT NOW,2015,2016   Arthritis    HIPS,KNEES,HANDS   Breast cancer in female [facility])    Cancer ([place])    LEFT   Hyperlipidemia    Hypertension    Neuromuscular disorder ([place])  MUSCLE PAIN,FIBROMYALGIA   Osteopenia    PVC (premature ventricular contraction)    Thyroid disease     SURGICAL HISTORY: Past Surgical History:  Procedure Laterality Date   Left Ovarian removal Left    MASTECTOMY Left    "LYMPHNODES REMOVED    SOCIAL HISTORY: Social History   Socioeconomic History   Marital status: Married    Spouse name: Not on file   Number of children: Not on file   Years of  education: Not on file   Highest education level: Not on file  Occupational History   Occupation: disabled  Tobacco Use   Smoking status: Never   Smokeless tobacco: Never  Vaping Use   Vaping Use: Never used  Substance and Sexual Activity   Alcohol use: Never   Drug use: Never   Sexual activity: Not Currently  Other Topics Concern   Not on file  Social History Narrative   Not on file   Social Determinants of Health   Financial Resource Strain: Not on file  Food Insecurity: Not on file  Transportation Needs: Not on file  Physical Activity: Not on file  Stress: Not on file  Social Connections: Not on file  Intimate Partner Violence: Not on file    FAMILY HISTORY: Family History  Problem Relation Age of Onset   Hypertension Mother    Leukemia Mother    Stomach cancer Father    Colon cancer Neg Hx    Colon polyps Neg Hx    Crohn's disease Neg Hx    Esophageal cancer Neg Hx    Rectal cancer Neg Hx     Review of Systems  Constitutional:  Negative for appetite change, chills, fatigue, fever and unexpected weight change.  HENT:   Negative for hearing loss, lump/mass and trouble swallowing.   Eyes:  Negative for eye problems and icterus.  Respiratory:  Negative for chest tightness, cough and shortness of breath.   Cardiovascular:  Negative for chest pain, leg swelling and palpitations.  Gastrointestinal:  Negative for abdominal distention, abdominal pain, constipation, diarrhea, nausea and vomiting.  Endocrine: Negative for hot flashes.  Genitourinary:  Negative for difficulty urinating.   Musculoskeletal:  Positive for arthralgias.  Skin:  Negative for itching and rash.  Neurological:  Negative for dizziness, extremity weakness, headaches and numbness.  Hematological:  Negative for adenopathy. Does not bruise/bleed easily.  Psychiatric/Behavioral:  Negative for depression. The patient is not nervous/anxious.       PHYSICAL EXAMINATION Patient sounds well.  She is  in no apparent distress.  Mood and behavior are normal, speech is normal, breathing is non labored.   LABORATORY DATA: None for this visit    ASSESSMENT and THERAPY PLAN:   History of breast cancer Tonya Odonnell is a 57 year old woman with history of left-sided stage Ia ER/PR positive breast cancer that was diagnosed in 2016 status postmastectomy, adjuvant chemotherapy, and antiestrogen therapy that she completed in 2022.  We reviewed her left breast and axilla ultrasound results that were completed at [facility].  There is no evidence of malignancy.  I reviewed this with her and now that we have ruled out recurrence I recommended that she be referred to physical therapy to work on range of motion and be evaluated for the possibility of lymphedema.  Should her discomfort continued despite physical therapy we will consider MRI or Ortho referral for further evaluation and management.    All questions were answered. The patient knows to call the clinic with any problems, questions or  concerns. We can certainly see the patient much sooner if necessary.  Follow up instructions:    -Return to cancer center in 6 months time -Physical therapy referral   The patient was provided an opportunity to ask questions and all were answered. The patient agreed with the plan and demonstrated an understanding of the instructions.   The patient was advised to call back or seek an in-person evaluation if the symptoms worsen or if the condition fails to improve as anticipated.   I provided 15 minutes of non face-to-face telephone visit time during this encounter, and > 50% was spent counseling as documented under my assessment & plan.  Wilber Bihari, NP 04/22/22 1:39 PM Medical Oncology and Hematology [facility] [place], [place] [address] Tel. [phone]    Fax. [phone]

## 2022-04-21 NOTE — Telephone Encounter (Signed)
Scheduled appointment per 2/12 los. Patient is aware of the made appointment.

## 2022-04-22 NOTE — Therapy (Incomplete)
OUTPATIENT PHYSICAL THERAPY ONCOLOGY EVALUATION  Patient Name: Tonya Odonnell MRN: [id number] DOB:November 12, 1965, 57 y.o., female Today's Date: 04/23/2022  END OF SESSION:  PT End of Session - 04/23/22 1201     Visit Number 1    Number of Visits 12    Date for PT Re-Evaluation 06/04/22    PT Start Time 1203    PT Stop Time [id number]    PT Time Calculation (min) 54 min    Activity Tolerance Patient tolerated treatment well    Behavior During Therapy WFL for tasks assessed/performed             Past Medical History:  Diagnosis Date   Anemia    "USE TO HAVE IT,NOT NOW,2015,2016   Arthritis    HIPS,KNEES,HANDS   Breast cancer in female ([place])    Cancer ([place])    LEFT   Hyperlipidemia    Hypertension    Neuromuscular disorder ([place])    MUSCLE PAIN,FIBROMYALGIA   Osteopenia    PVC (premature ventricular contraction)    Thyroid disease    Past Surgical History:  Procedure Laterality Date   Left Ovarian removal Left    MASTECTOMY Left    "LYMPHNODES REMOVED   Patient Active Problem List   Diagnosis Date Noted   Acute left-sided low back pain without sciatica 0000000   Lichen sclerosus of female genitalia 12/25/2021   Adrenal adenoma, left 09/17/2021   Pain management contract signed 06/13/2021   Chronic pain syndrome 06/03/2021   Chronic arthralgias of knees and hips 05/06/2021   Osteopenia of lumbar spine 05/05/2021   Obesity 05/05/2021   Anxiety 12/05/2020   Gallstones 12/05/2020   Gastroesophageal reflux disease without esophagitis 12/05/2020   Prediabetes 12/05/2020   Hyperlipidemia 02/08/2018   Hepatitis B core antibody positive 08/07/2017   History of breast cancer 12/13/2014   Chronic low back pain 02/17/2014   Fibromyalgia 10/26/2013   Adverse reaction to influenza vaccine 11/22/2012   Essential (primary) hypertension 05/05/2012   Hashimoto's thyroiditis 05/05/2012      REFERRING PROVIDER: Wilber Bihari, NP  REFERRING DIAG: Neck and shoulder pain,  lymphedema  THERAPY DIAG:  Personal history of breast cancer  Stiffness of right shoulder, not elsewhere classified  Stiffness of left shoulder, not elsewhere classified  Muscle weakness (generalized)  Abnormal posture  ONSET DATE: 2016  Rationale for Evaluation and Treatment: Rehabilitation  SUBJECTIVE:                                                                                                                                                                                           SUBJECTIVE STATEMENT: After she finished chemo in  2016  she was on Nortriptyline. When she stopped it she felt a flush of water in her body and since then she has pain in muscles and skin. When she exercises the next day she feels like she can't move. She has tried several different anti depressant medications. Sometimes ibuprofen helps as long as she only takes it 1x/day. She can't sit in chairs more than 20 minutes because it bothers her back. She has been trying to find out for 7 years what is going on. She has arthritis and muscle pain all together. Recently she has a sharp knife like pain in her LB .She was given steroids and muscle relaxer. The back pain started to resolve and she was urinating 5-6 times at night.and she started feeling so much better. She was getting extremely tired and just couldn't do much.  She is no longer on the meds and the pain is slowly starting to come back. She was diagnosed with Fibromyalgia after a swine flu vaccine. She got the vaccine on the left side and her left side of the body felt very different from the right. It got worse in 2015. After that they found the cancer in her LN's. She has swelling in the left UE, and had treatment in [place] with MLD and she has a compression sleeve but she doesn't wear it. She is not interested in being treated for lymphedema as she was treated in [place].. She has neck and left shoulder pain and has trouble turning her head to the  left. She is using voltaren gel and that seems to help. She has poor sleep, difficulty walking, bending to put on shoes. Her arms fatigue very quickly.   PERTINENT HISTORY:  Pt is s/p left mastectomy in 2016 with chemotherapy and hormonal therapy which was discontinued in 2022.[name] She was diagnosed with FMS a number of years ago. She complains of  diffuse body aches and retention of fluid.  PAIN:  Are you having pain? Yes NPRS scale: 7-8/10 Pain location: entire back, left neck and shoulder Pain orientation: Left and Bilateral  PAIN TYPE: aching and throbbing Pain description: constant  Aggravating factors: can't sleep at night, difficulty walking, bending, decreased home activities, fatigues easily Relieving factors: voltaren gel, ibuprofen x 1, CBD oil  PRECAUTIONS: Other: left  UE lymphedema , FMS, OA , poor vision  WEIGHT BEARING RESTRICTIONS: No  FALLS:  Has patient fallen in last 6 months? No  LIVING ENVIRONMENT: Lives with: lives with their son Lives in: House/apartment Stairs: Yes;  Has following equipment at home:  cane, walker,   OCCUPATION: does not work  LEISURE: TV, listen to Audio books  HAND DOMINANCE: right   PRIOR LEVEL OF FUNCTION: Independent  PATIENT GOALS: Decrease pain, greater ability to be active, clean home, (doesn't want lymphedema treatment;has had before)   OBJECTIVE:  COGNITION: Overall cognitive status: Within functional limits for tasks assessed   PALPATION: At least 14 FMS tender points positive, tender and tight throughout neck  and entire back, shoulders  OBSERVATIONS / OTHER ASSESSMENTS: limited ROM with tightness bilateral UE  SENSATION: Light touch: Deficits   left axillary region from surgery  POSTURE: forward head, rounded shoulders   UPPER EXTREMITY AROM/PROM:  A/PROM RIGHT   eval   Shoulder extension 32  Shoulder flexion 105, pulls  Shoulder abduction 126 pulls  Shoulder internal rotation   Shoulder external  rotation     (Blank rows = not tested)  A/PROM LEFT   eval  Shoulder extension  35  Shoulder flexion 109 pulls  Shoulder abduction 105  Shoulder internal rotation   Shoulder external rotation     (Blank rows = not tested)  CERVICAL AROM: Right rotation, WNL Left rotation decreased 45% Right SB tight left UT, limited 20% Left SB decreased 20%, pain on left BB; WNL no pain, FB;WNL;no pain Retraction; tight  LUMBAR: FB   hands to knees BB Decreased 70% Right SB; to knee: tight on left Left SB to knee: tight on right  UPPER EXTREMITY STRENGTH: NT  TENDER; at least 14 FMS tender points   LYMPHEDEMA ASSESSMENTS:   SURGERY TYPE/DATE: 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 Said she has a compression sleeve but doesn't wear it. She does not want to be treated for lymphedema. She has been treated before but she seems to have some misconceptions. May discuss with her again at another time.  LANDMARK RIGHT  eval  10 cm proximal to olecranon process   Olecranon process   10 cm proximal to ulnar styloid process   Just proximal to ulnar styloid process   Across hand at thumb web space   At base of 2nd digit   (Blank rows = not tested)  LANDMARK LEFT  eval  10 cm proximal to olecranon process   Olecranon process   10 cm proximal to ulnar styloid process   Just proximal to ulnar styloid process   Across hand at thumb web space   At base of 2nd digit   (Blank rows = not tested)     QUICK DASH SURVEY: 75%   TODAY'S TREATMENT:                                                                                                                                         DATE: Discussed plan of care and what will be included in her treatments. Starting slowly with flexibility and  gradually introducing strength, will consider aquatic therapy  PATIENT EDUCATION:  Education details: Discussed plan of care and what will be included in her treatments. Starting slowly with flexibility and gradually introducing strength, will consider aquatic therapy  Person educated: Patient Education method: Explanation Education comprehension: verbalized understanding  HOME EXERCISE PROGRAM: None given today  ASSESSMENT:  CLINICAL IMPRESSION: Patient is a 57 y.o. female who was seen today for physical therapy evaluation and treatment for complaints of neck and shoulder pain. , back pain upper, middle and lower, easy fatigue and limitations in all activities due to diffuse pain.  She has limitations in shoulder ROM with main complaint of tightness, pain in the left cervical region with left rotation. There is tightness with all lumbar ROM. There are at least 14 positive tender points  as seen with FMS, along with subjective complaints of swelling throughout the body, and fatigue.   She will benefit from physical therapy to gently progress ROM and flexibility, postural strength, and overall endurance for improvements in home and community  activities  OBJECTIVE IMPAIRMENTS: decreased activity tolerance, decreased endurance, decreased knowledge of condition, difficulty walking, decreased ROM, decreased strength, increased edema, impaired UE functional use, impaired vision/preception, postural dysfunction, and pain.   ACTIVITY LIMITATIONS: carrying, lifting, bending, sitting, standing, sleeping, stairs, and reach over head  PARTICIPATION LIMITATIONS: cleaning, community activity, and yard work  PERSONAL FACTORS: Time since onset of injury/illness/exacerbation and 3+ comorbidities: left breast CA s/p chemo, FMS  are also affecting patient's functional outcome.   REHAB POTENTIAL: Good  CLINICAL DECISION MAKING: Stable/uncomplicated  EVALUATION COMPLEXITY: Low  GOALS: Goals reviewed with  patient? Yes  SHORT TERM GOALS= LONG TERM GOALS: Target date: 06/04/2022  Pt will be independent  and a tolerant in a HEP for gentle ROM/stretching Baseline: Goal status: INITIAL  2.  Pt will have improved shoulder ROM by 15-20 degrees for shoulder flexion and abd for improved reaching ability Baseline: left 105, left 109 flex, Abduction Right 126, left 105 Goal status: INITIAL  3.  Pt will have full left cervical rotation without pain in left neck Baseline:  Goal status: INITIAL  4.  Pt will be tolerant of low level strengthening to improve  core,UE and LE endurance and overall strength Baseline:  Goal status: INITIAL  5.  Quick dash no greater than 30% to demonstrate improved function. PLAN:  PT FREQUENCY: 2x/week  PT DURATION: 6 weeks  PLANNED INTERVENTIONS: Therapeutic exercises, Therapeutic activity, Neuromuscular re-education, Balance training, Patient/Family education, Self Care, Joint mobilization, Orthotic/Fit training, Aquatic Therapy, Manual lymph drainage, Compression bandaging, Manual therapy, and Re-evaluation  PLAN FOR NEXT SESSION: start slowly due to FMS, muscles all very tight, gentle flexibility neck, shoulders, LB. etc Aquatics would be great to transition to but need to be sure it isn't too difficult for her, Not interested in Lymphedema treatment;she has had before, gentle strength or aquatics if interested   [employer], PT 04/23/2022, 5:56 PM

## 2022-04-22 NOTE — Assessment & Plan Note (Signed)
Tonya Odonnell is a 57 year old woman with history of left-sided stage Ia ER/PR positive breast cancer that was diagnosed in 2016 status postmastectomy, adjuvant chemotherapy, and antiestrogen therapy that she completed in 2022.  We reviewed her left breast and axilla ultrasound results that were completed at [facility].  There is no evidence of malignancy.  I reviewed this with her and now that we have ruled out recurrence I recommended that she be referred to physical therapy to work on range of motion and be evaluated for the possibility of lymphedema.  Should her discomfort continued despite physical therapy we will consider MRI or Ortho referral for further evaluation and management.

## 2022-04-23 ENCOUNTER — Ambulatory Visit: Payer: Medicaid Other | Attending: Adult Health

## 2022-04-23 ENCOUNTER — Other Ambulatory Visit: Payer: Self-pay

## 2022-04-23 DIAGNOSIS — M25612 Stiffness of left shoulder, not elsewhere classified: Secondary | ICD-10-CM | POA: Diagnosis not present

## 2022-04-23 DIAGNOSIS — Z853 Personal history of malignant neoplasm of breast: Secondary | ICD-10-CM | POA: Insufficient documentation

## 2022-04-23 DIAGNOSIS — M6281 Muscle weakness (generalized): Secondary | ICD-10-CM | POA: Diagnosis not present

## 2022-04-23 DIAGNOSIS — M25511 Pain in right shoulder: Secondary | ICD-10-CM | POA: Insufficient documentation

## 2022-04-23 DIAGNOSIS — R293 Abnormal posture: Secondary | ICD-10-CM | POA: Diagnosis not present

## 2022-04-23 DIAGNOSIS — M25611 Stiffness of right shoulder, not elsewhere classified: Secondary | ICD-10-CM | POA: Insufficient documentation

## 2022-04-29 ENCOUNTER — Ambulatory Visit: Payer: Medicaid Other

## 2022-04-29 DIAGNOSIS — M25612 Stiffness of left shoulder, not elsewhere classified: Secondary | ICD-10-CM | POA: Diagnosis not present

## 2022-04-29 DIAGNOSIS — Z853 Personal history of malignant neoplasm of breast: Secondary | ICD-10-CM | POA: Diagnosis not present

## 2022-04-29 DIAGNOSIS — R293 Abnormal posture: Secondary | ICD-10-CM

## 2022-04-29 DIAGNOSIS — M25511 Pain in right shoulder: Secondary | ICD-10-CM | POA: Diagnosis not present

## 2022-04-29 DIAGNOSIS — M6281 Muscle weakness (generalized): Secondary | ICD-10-CM | POA: Diagnosis not present

## 2022-04-29 DIAGNOSIS — M25611 Stiffness of right shoulder, not elsewhere classified: Secondary | ICD-10-CM

## 2022-04-29 NOTE — Therapy (Signed)
OUTPATIENT PHYSICAL THERAPY ONCOLOGY TREATMENT  Patient Name: Tonya Odonnell MRN: [id number] DOB:22-Sep-1965, 57 y.o., female Today's Date: 04/29/2022  END OF SESSION:  PT End of Session - 04/29/22 1100     Visit Number 2    Number of Visits 12    Date for PT Re-Evaluation 06/04/22    PT Start Time 1101    PT Stop Time 1155    PT Time Calculation (min) 54 min    Activity Tolerance Patient tolerated treatment well    Behavior During Therapy WFL for tasks assessed/performed             Past Medical History:  Diagnosis Date   Anemia    "USE TO HAVE IT,NOT NOW,2015,2016   Arthritis    HIPS,KNEES,HANDS   Breast cancer in female ([place])    Cancer ([place])    LEFT   Hyperlipidemia    Hypertension    Neuromuscular disorder ([place])    MUSCLE PAIN,FIBROMYALGIA   Osteopenia    PVC (premature ventricular contraction)    Thyroid disease    Past Surgical History:  Procedure Laterality Date   Left Ovarian removal Left    MASTECTOMY Left    "LYMPHNODES REMOVED   Patient Active Problem List   Diagnosis Date Noted   Acute left-sided low back pain without sciatica 0000000   Lichen sclerosus of female genitalia 12/25/2021   Adrenal adenoma, left 09/17/2021   Pain management contract signed 06/13/2021   Chronic pain syndrome 06/03/2021   Chronic arthralgias of knees and hips 05/06/2021   Osteopenia of lumbar spine 05/05/2021   Obesity 05/05/2021   Anxiety 12/05/2020   Gallstones 12/05/2020   Gastroesophageal reflux disease without esophagitis 12/05/2020   Prediabetes 12/05/2020   Hyperlipidemia 02/08/2018   Hepatitis B core antibody positive 08/07/2017   History of breast cancer 12/13/2014   Chronic low back pain 02/17/2014   Fibromyalgia 10/26/2013   Adverse reaction to influenza vaccine 11/22/2012   Essential (primary) hypertension 05/05/2012   Hashimoto's thyroiditis 05/05/2012      REFERRING PROVIDER: Wilber Bihari, NP  REFERRING DIAG: Neck and shoulder pain,  lymphedema  THERAPY DIAG:  Personal history of breast cancer  Stiffness of right shoulder, not elsewhere classified  Stiffness of left shoulder, not elsewhere classified  Muscle weakness (generalized)  Abnormal posture  ONSET DATE: 2016  Rationale for Evaluation and Treatment: Rehabilitation  SUBJECTIVE:                                                                                                                                                                                           SUBJECTIVE STATEMENT: No changes since last visit.  PERTINENT HISTORY:  Pt is s/p left mastectomy in 2016 with chemotherapy and hormonal therapy which was discontinued in 2022.[name] She was diagnosed with FMS a number of years ago. She complains of  diffuse body aches and retention of fluid. She has Left UE lymphedema but she does not want to be treated for it again. After she finished chemo in 2016  she was on Nortriptyline. When she stopped it she felt a flush of water in her body and since then she has pain in muscles and skin. When she exercises the next day she feels like she can't move. She has tried several different anti depressant medications. Sometimes ibuprofen helps as long as she only takes it 1x/day. She can't sit in chairs more than 20 minutes because it bothers her back. She has been trying to find out for 7 years what is going on. She has arthritis and muscle pain all together. Recently she has a sharp knife like pain in her LB .She was given steroids and muscle relaxer. The back pain started to resolve and she was urinating 5-6 times at night.and she started feeling so much better. She was getting extremely tired and just couldn't do much.  She is no longer on the meds and the pain is slowly starting to come back. She was diagnosed with Fibromyalgia after a swine flu vaccine. She got the vaccine on the left side and her left side of the body felt very different from the right. It got worse in  2015. After that they found the cancer in her LN's. She has swelling in the left UE, and had treatment in [place] with MLD and she has a compression sleeve but she doesn't wear it. She is not interested in being treated for lymphedema as she was treated in [place].. She has neck and left shoulder pain and has trouble turning her head to the left. She is using voltaren gel and that seems to help. She has poor sleep, difficulty walking, bending to put on shoes. Her arms fatigue very quickly. PAIN:  Are you having pain? Yes NPRS scale: 6/10 Pain location: entire back, left neck and shoulder Pain orientation: Left and Bilateral  PAIN TYPE: aching and throbbing Pain description: constant  Aggravating factors: can't sleep at night, difficulty walking, bending, decreased home activities, fatigues easily Relieving factors: voltaren gel, ibuprofen x 1, CBD oil  PRECAUTIONS: Other: left  UE lymphedema , FMS, OA , poor vision  WEIGHT BEARING RESTRICTIONS: No  FALLS:  Has patient fallen in last 6 months? No  LIVING ENVIRONMENT: Lives with: lives with their son Lives in: House/apartment Stairs: Yes;  Has following equipment at home:  cane, walker,   OCCUPATION: does not work  LEISURE: TV, listen to Audio books  HAND DOMINANCE: right   PRIOR LEVEL OF FUNCTION: Independent  PATIENT GOALS: Decrease pain, greater ability to be active, clean home, (doesn't want lymphedema treatment;has had before)   OBJECTIVE:  COGNITION: Overall cognitive status: Within functional limits for tasks assessed   PALPATION: At least 14 FMS tender points positive, tender and tight throughout neck  and entire back, shoulders  OBSERVATIONS / OTHER ASSESSMENTS: limited ROM with tightness bilateral UE  SENSATION: Light touch: Deficits   left axillary region from surgery  POSTURE: forward head, rounded shoulders   UPPER EXTREMITY AROM/PROM:  A/PROM RIGHT   eval   Shoulder extension 32  Shoulder  flexion 105, pulls  Shoulder abduction 126 pulls  Shoulder internal rotation   Shoulder external rotation     (  Blank rows = not tested)  A/PROM LEFT   eval  Shoulder extension 35  Shoulder flexion 109 pulls  Shoulder abduction 105  Shoulder internal rotation   Shoulder external rotation     (Blank rows = not tested)  CERVICAL AROM: Right rotation, WNL Left rotation decreased 45% Right SB tight left UT, limited 20% Left SB decreased 20%, pain on left BB; WNL no pain, FB;WNL;no pain Retraction; tight  LUMBAR: FB   hands to knees BB Decreased 70% Right SB; to knee: tight on left Left SB to knee: tight on right  UPPER EXTREMITY STRENGTH: NT  TENDER; at least 14 FMS tender points   LYMPHEDEMA ASSESSMENTS:   SURGERY TYPE/DATE: 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 Said she has a compression sleeve but doesn't wear it. She does not want to be treated for lymphedema. She has been treated before but she seems to have some misconceptions. May discuss with her again at another time.  LANDMARK RIGHT  eval  10 cm proximal to olecranon process   Olecranon process   10 cm proximal to ulnar styloid process   Just proximal to ulnar styloid process   Across hand at thumb web space   At base of 2nd digit   (Blank rows = not tested)  LANDMARK LEFT  eval  10 cm proximal to olecranon process   Olecranon process   10 cm proximal to ulnar styloid process   Just proximal to ulnar styloid process   Across hand at thumb web space   At base of 2nd digit   (Blank rows = not tested)     QUICK DASH SURVEY: 75%   TODAY'S TREATMENT:                                                                                                                                          DATE: 04/29/2022  Showed pt how to use a towel roll to support her lumbar spine. Practiced cervical retractions with VC's x 10, Scapular retractions sitting x 6 Practiced supine to and from sit to get on table x 2 x  Supine shoulder AA flexion x 4 Lower trunk rotation with arms outstetched x 5 ea Supine AROM flexion, scaption, horizontal abduction x 5 ,Meeks decompression x3 ea Updated HEP    Discussed plan of care and what will be included in her treatments. Starting slowly with flexibility and gradually introducing strength, will consider aquatic therapy  PATIENT EDUCATION:  Access Code: [email or web address] URL: [email or web address] Date: 04/29/2022 Prepared by: Cheral Almas  Exercises - Supine Lower Trunk Rotation  - 1-2 x daily - 7 x weekly - 1 sets - 3 reps - 10 hold - Seated Cervical  Retraction  - 1-2 x daily - 7 x weekly - 1 sets - 5 reps - Seated Scapular Retraction  - 1-2 x daily - 7 x weekly - 3 sets - 10 reps Education details: Discussed plan of care and what will be included in her treatments. Starting slowly with flexibility and gradually introducing strength, will consider aquatic therapy  Person educated: Patient Education method: Explanation Education comprehension: verbalized understanding  HOME EXERCISE PROGRAM:  - Supine Lower Trunk Rotation  - 1-2 x daily - 7 x weekly - 1 sets - 3 reps - 10 hold - Seated Cervical Retraction  - 1-2 x daily - 7 x weekly - 1 sets - 5 reps - Seated Scapular Retraction  - 1-2 x daily - 7 x weekly - 3 sets - 10 reps Meeks decompression x 3   ASSESSMENT:  CLINICAL IMPRESSION: Pt did well with exercises instructed. Lower trunk rotation eased her pain and tightness. She fatigued with  Supine AROM shoulder flexion, scaption and horizontal abd and arms got trembly.  She liked Meeks decompression exercises and felt better after treatment. She required occasional Verbal cues for exercises including scapular depression to prevent  upper trap compensation.  OBJECTIVE IMPAIRMENTS: decreased activity tolerance, decreased endurance, decreased knowledge of condition, difficulty walking, decreased ROM, decreased strength, increased edema, impaired UE functional use, impaired vision/preception, postural dysfunction, and pain.   ACTIVITY LIMITATIONS: carrying, lifting, bending, sitting, standing, sleeping, stairs, and reach over head  PARTICIPATION LIMITATIONS: cleaning, community activity, and yard work  PERSONAL FACTORS: Time since onset of injury/illness/exacerbation and 3+ comorbidities: left breast CA s/p chemo, FMS  are also affecting patient's functional outcome.   REHAB POTENTIAL: Good  CLINICAL DECISION MAKING: Stable/uncomplicated  EVALUATION COMPLEXITY: Low  GOALS: Goals reviewed with patient? Yes  SHORT TERM GOALS= LONG TERM GOALS: Target date: 06/04/2022  Pt will be independent  and a tolerant in a HEP for gentle ROM/stretching Baseline: Goal status: INITIAL  2.  Pt will have improved shoulder ROM by 15-20 degrees for shoulder flexion and abd for improved reaching ability Baseline: left 105, left 109 flex, Abduction Right 126, left 105 Goal status: INITIAL  3.  Pt will have full left cervical rotation without pain in left neck Baseline:  Goal status: INITIAL  4.  Pt will be tolerant of low level strengthening to improve  core,UE and LE endurance and overall strength Baseline:  Goal status: INITIAL  5.  Quick dash no greater than 30% to demonstrate improved function. PLAN:  PT FREQUENCY: 2x/week  PT DURATION: 6 weeks  PLANNED INTERVENTIONS: Therapeutic exercises, Therapeutic activity, Neuromuscular re-education, Balance training, Patient/Family education, Self Care, Joint mobilization, Orthotic/Fit training, Aquatic Therapy, Manual lymph drainage, Compression bandaging, Manual therapy, and Re-evaluation  PLAN FOR NEXT SESSION: start slowly due to FMS, muscles all very tight, review Meeks  decompression, 3 D supine shoulder AROM, KTC, gentle core stabs, gentle flexibility neck, shoulders, LB. etc Aquatics would be great to transition to but need to be sure it isn't too difficult for her, Not interested in Lymphedema treatment;she has had before, gentle strength or aquatics if interested   [employer], PT 04/29/2022, 11:57 AM

## 2022-04-29 NOTE — Patient Instructions (Signed)
1. Decompression Exercise     Cancer Rehab [phone]    Lie on back on firm surface, knees bent, feet flat, arms turned up, out to sides, backs of hands down. Time _5-15__ minutes. Surface: floor   2. Shoulder Press    Start in Decompression Exercise position. Press shoulders downward towards supporting surface. Hold __2-3__ seconds while counting out loud. Repeat _3-6___ times. Do _1-2___ times per day.   3. Head Press    Bring cervical spine (neck) into neutral position (by either tucking the chin towards the chest or tilting the chin upward). Feel weight on back of head. Press head downward into supporting surface.    Hold _2-3__ seconds. Repeat _3-6__ times. Do _1-2__ times per day.   4. Leg Lengthener    Straighten one leg. Pull toes AND forefoot toward knee, extend heel. Lengthen leg by pulling pelvis away from ribs. Hold _2-3__ seconds. Relax. Repeat __3-6__ times. Do other leg.  Surface: floor   5. Leg Press    Straighten one leg down to floor keeping leg aligned with hip. Pull toes AND forefoot toward knee; extend heel.  Press entire leg downward (as if pressing leg into sandy beach). DO NOT BEND KNEE. Hold _2-3__ seconds. Do __3-6__ times. Repeat with other leg.

## 2022-05-01 ENCOUNTER — Ambulatory Visit: Payer: Medicaid Other

## 2022-05-01 DIAGNOSIS — R293 Abnormal posture: Secondary | ICD-10-CM | POA: Diagnosis not present

## 2022-05-01 DIAGNOSIS — M25612 Stiffness of left shoulder, not elsewhere classified: Secondary | ICD-10-CM

## 2022-05-01 DIAGNOSIS — M25511 Pain in right shoulder: Secondary | ICD-10-CM | POA: Diagnosis not present

## 2022-05-01 DIAGNOSIS — M6281 Muscle weakness (generalized): Secondary | ICD-10-CM | POA: Diagnosis not present

## 2022-05-01 DIAGNOSIS — Z853 Personal history of malignant neoplasm of breast: Secondary | ICD-10-CM

## 2022-05-01 DIAGNOSIS — M25611 Stiffness of right shoulder, not elsewhere classified: Secondary | ICD-10-CM | POA: Diagnosis not present

## 2022-05-01 NOTE — Therapy (Signed)
OUTPATIENT PHYSICAL THERAPY ONCOLOGY TREATMENT  Patient Name: Tonya Odonnell MRN: [id number] DOB:[birth year], 57 y.o., female Today's Date: 05/01/2022  END OF SESSION:  PT End of Session - 05/01/22 1400     Visit Number 3    Number of Visits 12    Date for PT Re-Evaluation 06/04/22    PT Start Time 1402    PT Stop Time [id number]    PT Time Calculation (min) 51 min    Activity Tolerance Patient tolerated treatment well    Behavior During Therapy [facility] for tasks assessed/performed             Past Medical History:  Diagnosis Date   Anemia    "USE TO HAVE IT,NOT NOW,2015,2016   Arthritis    HIPS,KNEES,HANDS   Breast cancer in female ([place])    Cancer ([place])    LEFT   Hyperlipidemia    Hypertension    Neuromuscular disorder ([place])    MUSCLE PAIN,FIBROMYALGIA   Osteopenia    PVC (premature ventricular contraction)    Thyroid disease    Past Surgical History:  Procedure Laterality Date   Left Ovarian removal Left    MASTECTOMY Left    "LYMPHNODES REMOVED   Patient Active Problem List   Diagnosis Date Noted   Acute left-sided low back pain without sciatica 0000000   Lichen sclerosus of female genitalia 12/25/2021   Adrenal adenoma, left 09/17/2021   Pain management contract signed 06/13/2021   Chronic pain syndrome 06/03/2021   Chronic arthralgias of knees and hips 05/06/2021   Osteopenia of lumbar spine 05/05/2021   Obesity 05/05/2021   Anxiety 12/05/2020   Gallstones 12/05/2020   Gastroesophageal reflux disease without esophagitis 12/05/2020   Prediabetes 12/05/2020   Hyperlipidemia 02/08/2018   Hepatitis B core antibody positive 08/07/2017   History of breast cancer 12/13/2014   Chronic low back pain 02/17/2014   Fibromyalgia 10/26/2013   Adverse reaction to influenza vaccine 11/22/2012   Essential (primary) hypertension 05/05/2012   Hashimoto's thyroiditis 05/05/2012      REFERRING PROVIDER: Wilber Bihari, NP  REFERRING DIAG: Neck and shoulder pain,  lymphedema  THERAPY DIAG:  Personal history of breast cancer  Stiffness of right shoulder, not elsewhere classified  Stiffness of left shoulder, not elsewhere classified  Muscle weakness (generalized)  Abnormal posture  ONSET DATE: 2016  Rationale for Evaluation and Treatment: Rehabilitation  SUBJECTIVE:                                                                                                                                                                                           SUBJECTIVE STATEMENT: I did OK after last  visit. Wednesday I dropped a pan with chicken and a sauce that was too heavy for me. After cleaning up the kitchen I was extremely tired and couldn't move a muscle.  My hands are so weak. I feel like I am full of extra fluid. I couldn't bend my back because I am all swollen. The swelling all went away when I was on the steroid pack and now it is all coming back. I didn't get to do any exercises because I was so worn out.  PERTINENT HISTORY:  Pt is s/p left mastectomy in 2016 with chemotherapy and hormonal therapy which was discontinued in 2022.[name] She was diagnosed with FMS a number of years ago. She complains of  diffuse body aches and retention of fluid. She has Left UE lymphedema but she does not want to be treated for it again. After she finished chemo in 2016  she was on Nortriptyline. When she stopped it she felt a flush of water in her body and since then she has pain in muscles and skin. When she exercises the next day she feels like she can't move. She has tried several different anti depressant medications. Sometimes ibuprofen helps as long as she only takes it 1x/day. She can't sit in chairs more than 20 minutes because it bothers her back. She has been trying to find out for 7 years what is going on. She has arthritis and muscle pain all together. Recently she has a sharp knife like pain in her LB .She was given steroids and muscle relaxer. The back pain  started to resolve and she was urinating 5-6 times at night.and she started feeling so much better. She was getting extremely tired and just couldn't do much.  She is no longer on the meds and the pain is slowly starting to come back. She was diagnosed with Fibromyalgia after a swine flu vaccine. She got the vaccine on the left side and her left side of the body felt very different from the right. It got worse in 2015. After that they found the cancer in her LN's. She has swelling in the left UE, and had treatment in [place] with MLD and she has a compression sleeve but she doesn't wear it. She is not interested in being treated for lymphedema as she was treated in [place].. She has neck and left shoulder pain and has trouble turning her head to the left. She is using voltaren gel and that seems to help. She has poor sleep, difficulty walking, bending to put on shoes. Her arms fatigue very quickly. PAIN:  Are you having pain? Yes NPRS scale: 7/10 Pain location: entire back, left neck and shoulder, left chest is sore to touch  Pain orientation: Left and Bilateral  PAIN TYPE: aching and throbbing Pain description: constant  Aggravating factors: can't sleep at night, difficulty walking, bending, decreased home activities, fatigues easily Relieving factors: voltaren gel, ibuprofen x 1, CBD oil  PRECAUTIONS: Other: left  UE lymphedema , FMS, OA , poor vision  WEIGHT BEARING RESTRICTIONS: No  FALLS:  Has patient fallen in last 6 months? No  LIVING ENVIRONMENT: Lives with: lives with their son Lives in: House/apartment Stairs: Yes;  Has following equipment at home:  cane, walker,   OCCUPATION: does not work  LEISURE: TV, listen to Audio books  HAND DOMINANCE: right   PRIOR LEVEL OF FUNCTION: Independent  PATIENT GOALS: Decrease pain, greater ability to be active, clean home, (doesn't want lymphedema treatment;has had before)   OBJECTIVE:  COGNITION: Overall cognitive status:  Within functional limits for tasks assessed   PALPATION: At least 14 FMS tender points positive, tender and tight throughout neck  and entire back, shoulders  OBSERVATIONS / OTHER ASSESSMENTS: limited ROM with tightness bilateral UE  SENSATION: Light touch: Deficits   left axillary region from surgery  POSTURE: forward head, rounded shoulders   UPPER EXTREMITY AROM/PROM:  A/PROM RIGHT   eval   Shoulder extension 32  Shoulder flexion 105, pulls  Shoulder abduction 126 pulls  Shoulder internal rotation   Shoulder external rotation     (Blank rows = not tested)  A/PROM LEFT   eval  Shoulder extension 35  Shoulder flexion 109 pulls  Shoulder abduction 105  Shoulder internal rotation   Shoulder external rotation     (Blank rows = not tested)  CERVICAL AROM: Right rotation, WNL Left rotation decreased 45% Right SB tight left UT, limited 20% Left SB decreased 20%, pain on left BB; WNL no pain, FB;WNL;no pain Retraction; tight  LUMBAR: FB   hands to knees BB Decreased 70% Right SB; to knee: tight on left Left SB to knee: tight on right  UPPER EXTREMITY STRENGTH: NT  TENDER; at least 14 FMS tender points   LYMPHEDEMA ASSESSMENTS:   SURGERY TYPE/DATE: 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 Said she has a compression sleeve but doesn't wear it. She does not want to be treated for lymphedema. She has been treated before but she seems to have some misconceptions. May discuss with her again at another time.  LANDMARK RIGHT  eval  10 cm proximal to olecranon process   Olecranon process   10 cm proximal to ulnar styloid process   Just proximal to ulnar styloid process   Across hand at thumb web space   At base of 2nd digit   (Blank rows = not tested)  LANDMARK  LEFT  eval  10 cm proximal to olecranon process   Olecranon process   10 cm proximal to ulnar styloid process   Just proximal to ulnar styloid process   Across hand at thumb web space   At base of 2nd digit   (Blank rows = not tested)     QUICK DASH SURVEY: 75%   TODAY'S TREATMENT:                                                                                                                                         DATE:  05/01/2022 Nu-step seat 8, UE 11, lev 3 x 4 min  129 steps Meeks decompression exercises x 5 ea Gentle PPT x10, ppt with bolster squeeze x 10 Figure 4 stretch B 3 x 20 sec supine( tighter on  left) Ppt with marching x 10 Ppt and march with alternate hand to knee x 10 Marching with alternate upper arm raises and marching x 10 LTR bilaterally x6 bilaterally with arms outstretched Ppt with bent knee fallout x 10 Supine AROM flexion, scaption, horizontal abd x 5 ea  Leg lengthening x 3 B, also did with right UE overhead, but not left due to shoulder pain.   04/29/2022  Showed pt how to use a towel roll to support her lumbar spine. Practiced cervical retractions with VC's x 10, Scapular retractions sitting x 6 Practiced supine to and from sit to get on table x 2 x  Supine shoulder AA flexion x 4 Lower trunk rotation with arms outstetched x 5 ea Supine AROM flexion, scaption, horizontal abduction x 5 ,Meeks decompression x3 ea Updated HEP    Discussed plan of care and what will be included in her treatments. Starting slowly with flexibility and gradually introducing strength, will consider aquatic therapy  PATIENT EDUCATION:  Access Code: [email or web address] URL: [email or web address] Date: 04/29/2022 Prepared by: Cheral Almas  Exercises - Supine Lower Trunk Rotation  - 1-2 x daily - 7 x weekly - 1 sets - 3 reps - 10 hold - Seated Cervical Retraction  - 1-2 x daily - 7 x weekly - 1 sets - 5 reps - Seated Scapular Retraction  - 1-2 x daily - 7 x  weekly - 3 sets - 10 reps Education details: Discussed plan of care and what will be included in her treatments. Starting slowly with flexibility and gradually introducing strength, will consider aquatic therapy  Person educated: Patient Education method: Explanation Education comprehension: verbalized understanding  HOME EXERCISE PROGRAM:  - Supine Lower Trunk Rotation  - 1-2 x daily - 7 x weekly - 1 sets - 3 reps - 10 hold - Seated Cervical Retraction  - 1-2 x daily - 7 x weekly - 1 sets - 5 reps - Seated Scapular Retraction  - 1-2 x daily - 7 x weekly - 3 sets - 10 reps Meeks decompression x 3   ASSESSMENT:  CLINICAL IMPRESSION:  Pt was very fatigued after exercises. Greater trochanter area/ gluts bilaterally very tight greatest on the left. Pt felt like today was much harder than previous visit. She used good form with exercises  OBJECTIVE IMPAIRMENTS: decreased activity tolerance, decreased endurance, decreased knowledge of condition, difficulty walking, decreased ROM, decreased strength, increased edema, impaired UE functional use, impaired vision/preception, postural dysfunction, and pain.   ACTIVITY LIMITATIONS: carrying, lifting, bending, sitting, standing, sleeping, stairs, and reach over head  PARTICIPATION LIMITATIONS: cleaning, community activity, and yard work  PERSONAL FACTORS: Time since onset of injury/illness/exacerbation and 3+ comorbidities: left breast CA s/p chemo, FMS  are also affecting patient's functional outcome.   REHAB POTENTIAL: Good  CLINICAL DECISION MAKING: Stable/uncomplicated  EVALUATION COMPLEXITY: Low  GOALS: Goals reviewed with patient? Yes  SHORT TERM GOALS= LONG TERM GOALS: Target date: 06/04/2022  Pt will be independent  and a tolerant in a HEP for gentle ROM/stretching Baseline: Goal status: INITIAL  2.  Pt will have improved shoulder ROM by 15-20 degrees for shoulder flexion and abd for improved reaching ability Baseline: left  105, left 109 flex, Abduction Right 126, left 105 Goal status: INITIAL  3.  Pt will have full left cervical rotation without pain in left neck Baseline:  Goal status: INITIAL  4.  Pt will be tolerant of low level strengthening to improve  core,UE and LE endurance and overall strength Baseline:  Goal status: INITIAL  5.  Quick dash no greater than 30% to demonstrate improved function. PLAN:  PT FREQUENCY: 2x/week  PT DURATION: 6 weeks  PLANNED INTERVENTIONS: Therapeutic exercises, Therapeutic activity, Neuromuscular re-education, Balance training, Patient/Family education, Self Care, Joint mobilization, Orthotic/Fit training, Aquatic Therapy, Manual lymph drainage, Compression bandaging, Manual therapy, and Re-evaluation  PLAN FOR NEXT SESSION: start slowly due to FMS, muscles all very tight, review Meeks decompression, 3 D supine shoulder AROM, KTC, gentle core stabs, gentle flexibility neck, shoulders, LB. etc Aquatics would be great to transition to but need to be sure it isn't too difficult for her, Not interested in Lymphedema treatment;she has had before, gentle strength or aquatics if interested   [employer], PT 05/01/2022, 2:54 PM

## 2022-05-06 ENCOUNTER — Ambulatory Visit: Payer: Medicaid Other

## 2022-05-08 ENCOUNTER — Ambulatory Visit: Payer: Medicaid Other

## 2022-05-08 DIAGNOSIS — M25611 Stiffness of right shoulder, not elsewhere classified: Secondary | ICD-10-CM | POA: Diagnosis not present

## 2022-05-08 DIAGNOSIS — M6281 Muscle weakness (generalized): Secondary | ICD-10-CM

## 2022-05-08 DIAGNOSIS — M25511 Pain in right shoulder: Secondary | ICD-10-CM | POA: Diagnosis not present

## 2022-05-08 DIAGNOSIS — R293 Abnormal posture: Secondary | ICD-10-CM | POA: Diagnosis not present

## 2022-05-08 DIAGNOSIS — Z853 Personal history of malignant neoplasm of breast: Secondary | ICD-10-CM | POA: Diagnosis not present

## 2022-05-08 DIAGNOSIS — M25612 Stiffness of left shoulder, not elsewhere classified: Secondary | ICD-10-CM

## 2022-05-08 NOTE — Therapy (Signed)
OUTPATIENT PHYSICAL THERAPY ONCOLOGY TREATMENT  Patient Name: Tonya Odonnell MRN: [id number] DOB:[birth year], 57 y.o., female Today's Date: 05/08/2022  END OF SESSION:  PT End of Session - 05/08/22 1101     Visit Number 4    Number of Visits 12    Date for PT Re-Evaluation 06/04/22    PT Start Time 46    PT Stop Time 1155    PT Time Calculation (min) 53 min    Activity Tolerance Patient tolerated treatment well    Behavior During Therapy WFL for tasks assessed/performed             Past Medical History:  Diagnosis Date   Anemia    "USE TO HAVE IT,NOT NOW,2015,2016   Arthritis    HIPS,KNEES,HANDS   Breast cancer in female ([place])    Cancer ([place])    LEFT   Hyperlipidemia    Hypertension    Neuromuscular disorder ([place])    MUSCLE PAIN,FIBROMYALGIA   Osteopenia    PVC (premature ventricular contraction)    Thyroid disease    Past Surgical History:  Procedure Laterality Date   Left Ovarian removal Left    MASTECTOMY Left    "LYMPHNODES REMOVED   Patient Active Problem List   Diagnosis Date Noted   Acute left-sided low back pain without sciatica 0000000   Lichen sclerosus of female genitalia 12/25/2021   Adrenal adenoma, left 09/17/2021   Pain management contract signed 06/13/2021   Chronic pain syndrome 06/03/2021   Chronic arthralgias of knees and hips 05/06/2021   Osteopenia of lumbar spine 05/05/2021   Obesity 05/05/2021   Anxiety 12/05/2020   Gallstones 12/05/2020   Gastroesophageal reflux disease without esophagitis 12/05/2020   Prediabetes 12/05/2020   Hyperlipidemia 02/08/2018   Hepatitis B core antibody positive 08/07/2017   History of breast cancer 12/13/2014   Chronic low back pain 02/17/2014   Fibromyalgia 10/26/2013   Adverse reaction to influenza vaccine 11/22/2012   Essential (primary) hypertension 05/05/2012   Hashimoto's thyroiditis 05/05/2012      REFERRING PROVIDER: Wilber Bihari, NP  REFERRING DIAG: Neck and shoulder pain,  lymphedema  THERAPY DIAG:  Personal history of breast cancer  Stiffness of right shoulder, not elsewhere classified  Stiffness of left shoulder, not elsewhere classified  Muscle weakness (generalized)  Abnormal posture  ONSET DATE: 2016  Rationale for Evaluation and Treatment: Rehabilitation  SUBJECTIVE:                                                                                                                                                                                           SUBJECTIVE STATEMENT:  I was very tired  after last visit.  I had to go the bathroom 5x in the night. I feel like the fluid got squeezed out after exercising and I felt better the next day. It felt good. I did the exercises 1 time this week.  PERTINENT HISTORY:  Pt is s/p left mastectomy in 2016 with chemotherapy and hormonal therapy which was discontinued in 2022.[name] She was diagnosed with FMS a number of years ago. She complains of  diffuse body aches and retention of fluid. She has Left UE lymphedema but she does not want to be treated for it again. After she finished chemo in 2016  she was on Nortriptyline. When she stopped it she felt a flush of water in her body and since then she has pain in muscles and skin. When she exercises the next day she feels like she can't move. She has tried several different anti depressant medications. Sometimes ibuprofen helps as long as she only takes it 1x/day. She can't sit in chairs more than 20 minutes because it bothers her back. She has been trying to find out for 7 years what is going on. She has arthritis and muscle pain all together. Recently she has a sharp knife like pain in her LB .She was given steroids and muscle relaxer. The back pain started to resolve and she was urinating 5-6 times at night.and she started feeling so much better. She was getting extremely tired and just couldn't do much.  She is no longer on the meds and the pain is slowly starting to come  back. She was diagnosed with Fibromyalgia after a swine flu vaccine. She got the vaccine on the left side and her left side of the body felt very different from the right. It got worse in 2015. After that they found the cancer in her LN's. She has swelling in the left UE, and had treatment in [place] with MLD and she has a compression sleeve but she doesn't wear it. She is not interested in being treated for lymphedema as she was treated in [place].. She has neck and left shoulder pain and has trouble turning her head to the left. She is using voltaren gel and that seems to help. She has poor sleep, difficulty walking, bending to put on shoes. Her arms fatigue very quickly. PAIN:  Are you having pain? Yes NPRS scale: 7/10 Pain location: entire back, left neck and shoulder, left chest, Right elbow feels tight, is sore to touch  Pain orientation: Left and Bilateral  PAIN TYPE: aching and throbbing Pain description: constant  Aggravating factors: can't sleep at night, difficulty walking, bending, decreased home activities, fatigues easily Relieving factors: voltaren gel, ibuprofen x 1, CBD oil  PRECAUTIONS: Other: left  UE lymphedema , FMS, OA , poor vision  WEIGHT BEARING RESTRICTIONS: No  FALLS:  Has patient fallen in last 6 months? No  LIVING ENVIRONMENT: Lives with: lives with their son Lives in: House/apartment Stairs: Yes;  Has following equipment at home:  cane, walker,   OCCUPATION: does not work  LEISURE: TV, listen to Audio books  HAND DOMINANCE: right   PRIOR LEVEL OF FUNCTION: Independent  PATIENT GOALS: Decrease pain, greater ability to be active, clean home, (doesn't want lymphedema treatment;has had before)   OBJECTIVE:  COGNITION: Overall cognitive status: Within functional limits for tasks assessed   PALPATION: At least 14 FMS tender points positive, tender and tight throughout neck  and entire back, shoulders  OBSERVATIONS / OTHER ASSESSMENTS: limited  ROM with tightness bilateral  UE  SENSATION: Light touch: Deficits   left axillary region from surgery  POSTURE: forward head, rounded shoulders   UPPER EXTREMITY AROM/PROM:  A/PROM RIGHT   eval   Shoulder extension 32  Shoulder flexion 105, pulls  Shoulder abduction 126 pulls  Shoulder internal rotation   Shoulder external rotation     (Blank rows = not tested)  A/PROM LEFT   eval  Shoulder extension 35  Shoulder flexion 109 pulls  Shoulder abduction 105  Shoulder internal rotation   Shoulder external rotation     (Blank rows = not tested)  CERVICAL AROM: Right rotation, WNL Left rotation decreased 45% Right SB tight left UT, limited 20% Left SB decreased 20%, pain on left BB; WNL no pain, FB;WNL;no pain Retraction; tight  LUMBAR: FB   hands to knees BB Decreased 70% Right SB; to knee: tight on left Left SB to knee: tight on right  UPPER EXTREMITY STRENGTH: NT  TENDER; at least 14 FMS tender points   LYMPHEDEMA ASSESSMENTS:   SURGERY TYPE/DATE: 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 Said she has a compression sleeve but doesn't wear it. She does not want to be treated for lymphedema. She has been treated before but she seems to have some misconceptions. May discuss with her again at another time.  LANDMARK RIGHT  eval  10 cm proximal to olecranon process   Olecranon process   10 cm proximal to ulnar styloid process   Just proximal to ulnar styloid process   Across hand at thumb web space   At base of 2nd digit   (Blank rows = not tested)  LANDMARK LEFT  eval  10 cm proximal to olecranon process   Olecranon process   10 cm proximal to ulnar styloid process   Just proximal to ulnar styloid process   Across hand at thumb web space   At base of  2nd digit   (Blank rows = not tested)     QUICK DASH SURVEY: 75%   TODAY'S TREATMENT:                                                                                                                                         DATE:  05/08/2022 Nu-step seat 8, UE 11, lev 3 x 3:50 min, 120 steps fatigue in right UE 5 diaphragmatic breaths Meeks Decompression exercises x 5 ea LTR bilaterally with arms outstretched x 5, 5 sec hold PPT x 10 Ppt with marching x 10 mulitple VC's to do correctly Ppt and march with alternate hand to knee x 10 SKTC stretch x 3 B Figure 4 stretch x 3 B, Left leg much tighter DF stretch with strap  3 x  15 Supine Bilateral shoulder AROM flexion, scaption, horizontal abduction x 5 Therapist demonstrated ppt at end of treatment 05/01/2022 Nu-step seat 8, UE 11, lev 3 x 4 min  129 steps Meeks decompression exercises x 5 ea Gentle PPT x10, ppt with bolster squeeze x 10 Figure 4 stretch B 3 x 20 sec supine( tighter on left) Ppt with marching x 10 Ppt and march with alternate hand to knee x 10 Marching with alternate upper arm raises and marching x 10 LTR bilaterally x6 bilaterally with arms outstretched Ppt with bent knee fallout x 10 Supine AROM flexion, scaption, horizontal abd x 5 ea  Leg lengthening x 3 B, also did with right UE overhead, but not left due to shoulder pain.   04/29/2022  Showed pt how to use a towel roll to support her lumbar spine. Practiced cervical retractions with VC's x 10, Scapular retractions sitting x 6 Practiced supine to and from sit to get on table x 2 x  Supine shoulder AA flexion x 4 Lower trunk rotation with arms outstetched x 5 ea Supine AROM flexion, scaption, horizontal abduction x 5 ,Meeks decompression x3 ea Updated HEP    Discussed plan of care and what will be included in her treatments. Starting slowly with flexibility and gradually introducing strength, will consider aquatic therapy  PATIENT EDUCATION:  Access  Code: [email or web address] URL: [email or web address] Date: 04/29/2022 Prepared by: Cheral Almas Meeks Decompression Exercises - Supine Lower Trunk Rotation  - 1-2 x daily - 7 x weekly - 1 sets - 3 reps - 10 hold - Seated Cervical Retraction  - 1-2 x daily - 7 x weekly - 1 sets - 5 reps - Seated Scapular Retraction  - 1-2 x daily - 7 x weekly - 3 sets - 10 reps Education details: Discussed plan of care and what will be included in her treatments. Starting slowly with flexibility and gradually introducing strength, will consider aquatic therapy  Person educated: Patient Education method: Explanation Education comprehension: verbalized understanding  HOME EXERCISE PROGRAM:  - Supine Lower Trunk Rotation  - 1-2 x daily - 7 x weekly - 1 sets - 3 reps - 10 hold - Seated Cervical Retraction  - 1-2 x daily - 7 x weekly - 1 sets - 5 reps - Seated Scapular Retraction  - 1-2 x daily - 7 x weekly - 3 sets - 10 reps Meeks decompression x 3   ASSESSMENT:  CLINICAL IMPRESSION:  Pt felt more used to the exercises today and not as fatigued. Pain still present but no worse. Pt had trouble with ppt but improved with practiced. [place] very difficult for pt.  OBJECTIVE IMPAIRMENTS: decreased activity tolerance, decreased endurance, decreased knowledge of condition, difficulty walking, decreased ROM, decreased strength, increased edema, impaired UE functional use, impaired vision/preception, postural dysfunction, and pain.   ACTIVITY LIMITATIONS: carrying, lifting, bending, sitting, standing, sleeping, stairs, and reach over head  PARTICIPATION LIMITATIONS: cleaning, community activity, and yard work  PERSONAL FACTORS: Time since onset of injury/illness/exacerbation and 3+ comorbidities: left breast CA s/p chemo, FMS  are also affecting patient's functional outcome.   REHAB POTENTIAL: Good  CLINICAL DECISION MAKING: Stable/uncomplicated  EVALUATION COMPLEXITY: Low  GOALS: Goals reviewed with  patient? Yes  SHORT TERM GOALS= LONG TERM GOALS: Target date: 06/04/2022  Pt will be independent  and a tolerant in a HEP for gentle ROM/stretching Baseline: Goal status: INITIAL  2.  Pt will have improved shoulder ROM by 15-20 degrees for shoulder flexion and abd for improved reaching  ability Baseline: left 105, left 109 flex, Abduction Right 126, left 105 Goal status: INITIAL  3.  Pt will have full left cervical rotation without pain in left neck Baseline:  Goal status: INITIAL  4.  Pt will be tolerant of low level strengthening to improve  core,UE and LE endurance and overall strength Baseline:  Goal status: INITIAL  5.  Quick dash no greater than 30% to demonstrate improved function. PLAN:  PT FREQUENCY: 2x/week  PT DURATION: 6 weeks  PLANNED INTERVENTIONS: Therapeutic exercises, Therapeutic activity, Neuromuscular re-education, Balance training, Patient/Family education, Self Care, Joint mobilization, Orthotic/Fit training, Aquatic Therapy, Manual lymph drainage, Compression bandaging, Manual therapy, and Re-evaluation  PLAN FOR NEXT SESSION: start slowly due to FMS, muscles all very tight, review Meeks decompression, 3 D supine shoulder AROM, KTC, gentle core stabs, gentle flexibility neck, shoulders, LB. etc Aquatics would be great to transition to but need to be sure it isn't too difficult for her, Not interested in Lymphedema treatment;she has had before, gentle strength or aquatics if interested   [employer], PT 05/08/2022, 11:57 AM

## 2022-05-09 DIAGNOSIS — Z419 Encounter for procedure for purposes other than remedying health state, unspecified: Secondary | ICD-10-CM | POA: Diagnosis not present

## 2022-05-13 ENCOUNTER — Ambulatory Visit: Payer: Medicaid Other | Attending: Adult Health

## 2022-05-13 DIAGNOSIS — Z853 Personal history of malignant neoplasm of breast: Secondary | ICD-10-CM | POA: Diagnosis not present

## 2022-05-13 DIAGNOSIS — M6281 Muscle weakness (generalized): Secondary | ICD-10-CM | POA: Diagnosis not present

## 2022-05-13 DIAGNOSIS — M25611 Stiffness of right shoulder, not elsewhere classified: Secondary | ICD-10-CM | POA: Diagnosis not present

## 2022-05-13 DIAGNOSIS — M25612 Stiffness of left shoulder, not elsewhere classified: Secondary | ICD-10-CM

## 2022-05-13 DIAGNOSIS — R293 Abnormal posture: Secondary | ICD-10-CM

## 2022-05-13 NOTE — Therapy (Signed)
OUTPATIENT PHYSICAL THERAPY ONCOLOGY TREATMENT  Patient Name: Tonya Odonnell MRN: [id number] DOB:04/15/1965, 57 y.o., female Today's Date: 05/13/2022  END OF SESSION:  PT End of Session - 05/13/22 1401     Visit Number 5    Number of Visits 12    Date for PT Re-Evaluation 06/04/22    Authorization Type [employer]    Authorization - Visit Number 5    Authorization - Number of Visits 12    PT Start Time [id number]    PT Stop Time 1450    PT Time Calculation (min) 47 min    Activity Tolerance Patient tolerated treatment well    Behavior During Therapy WFL for tasks assessed/performed             Past Medical History:  Diagnosis Date   Anemia    "USE TO HAVE IT,NOT NOW,2015,2016   Arthritis    HIPS,KNEES,HANDS   Breast cancer in female ([place])    Cancer ([place])    LEFT   Hyperlipidemia    Hypertension    Neuromuscular disorder ([place])    MUSCLE PAIN,FIBROMYALGIA   Osteopenia    PVC (premature ventricular contraction)    Thyroid disease    Past Surgical History:  Procedure Laterality Date   Left Ovarian removal Left    MASTECTOMY Left    "LYMPHNODES REMOVED   Patient Active Problem List   Diagnosis Date Noted   Acute left-sided low back pain without sciatica 0000000   Lichen sclerosus of female genitalia 12/25/2021   Adrenal adenoma, left 09/17/2021   Pain management contract signed 06/13/2021   Chronic pain syndrome 06/03/2021   Chronic arthralgias of knees and hips 05/06/2021   Osteopenia of lumbar spine 05/05/2021   Obesity 05/05/2021   Anxiety 12/05/2020   Gallstones 12/05/2020   Gastroesophageal reflux disease without esophagitis 12/05/2020   Prediabetes 12/05/2020   Hyperlipidemia 02/08/2018   Hepatitis B core antibody positive 08/07/2017   History of breast cancer 12/13/2014   Chronic low back pain 02/17/2014   Fibromyalgia 10/26/2013   Adverse reaction to influenza vaccine 11/22/2012   Essential (primary) hypertension 05/05/2012   Hashimoto's thyroiditis  05/05/2012      REFERRING PROVIDER: Wilber Bihari, NP  REFERRING DIAG: Neck and shoulder pain, lymphedema  THERAPY DIAG:  Personal history of breast cancer  Stiffness of right shoulder, not elsewhere classified  Stiffness of left shoulder, not elsewhere classified  Muscle weakness (generalized)  Abnormal posture  ONSET DATE: 2016  Rationale for Evaluation and Treatment: Rehabilitation  SUBJECTIVE:  SUBJECTIVE STATEMENT: I did ok after last visit and I did some exercises on Saturday. I didn't sleep well last night. I don't do much at home because it makes me feel worse   PERTINENT HISTORY:  Pt is s/p left mastectomy in 2016 with chemotherapy and hormonal therapy which was discontinued in 2022.[name] She was diagnosed with FMS a number of years ago. She complains of  diffuse body aches and retention of fluid. She has Left UE lymphedema but she does not want to be treated for it again. After she finished chemo in 2016  she was on Nortriptyline. When she stopped it she felt a flush of water in her body and since then she has pain in muscles and skin. When she exercises the next day she feels like she can't move. She has tried several different anti depressant medications. Sometimes ibuprofen helps as long as she only takes it 1x/day. She can't sit in chairs more than 20 minutes because it bothers her back. She has been trying to find out for 7 years what is going on. She has arthritis and muscle pain all together. Recently she has a sharp knife like pain in her LB .She was given steroids and muscle relaxer. The back pain started to resolve and she was urinating 5-6 times at night.and she started feeling so much better. She was getting extremely tired and just couldn't do much.  She is no longer on the meds and  the pain is slowly starting to come back. She was diagnosed with Fibromyalgia after a swine flu vaccine. She got the vaccine on the left side and her left side of the body felt very different from the right. It got worse in 2015. After that they found the cancer in her LN's. She has swelling in the left UE, and had treatment in [place] with MLD and she has a compression sleeve but she doesn't wear it. She is not interested in being treated for lymphedema as she was treated in [place].. She has neck and left shoulder pain and has trouble turning her head to the left. She is using voltaren gel and that seems to help. She has poor sleep, difficulty walking, bending to put on shoes. Her arms fatigue very quickly. PAIN:  Are you having pain? Yes NPRS scale: 8/10 Pain location: entire back, left neck and shoulder, left chest, Right elbow feels tight, is sore to touch  Pain orientation: Left and Bilateral  PAIN TYPE: aching and throbbing Pain description: constant  Aggravating factors: can't sleep at night, difficulty walking, bending, decreased home activities, fatigues easily Relieving factors: voltaren gel, ibuprofen x 1, CBD oil  PRECAUTIONS: Other: left  UE lymphedema , FMS, OA , poor vision  WEIGHT BEARING RESTRICTIONS: No  FALLS:  Has patient fallen in last 6 months? No  LIVING ENVIRONMENT: Lives with: lives with their son Lives in: House/apartment Stairs: Yes;  Has following equipment at home:  cane, walker,   OCCUPATION: does not work  LEISURE: TV, listen to Audio books  HAND DOMINANCE: right   PRIOR LEVEL OF FUNCTION: Independent  PATIENT GOALS: Decrease pain, greater ability to be active, clean home, (doesn't want lymphedema treatment;has had before)   OBJECTIVE:  COGNITION: Overall cognitive status: Within functional limits for tasks assessed   PALPATION: At least 14 FMS tender points positive, tender and tight throughout neck  and entire back,  shoulders  OBSERVATIONS / OTHER ASSESSMENTS: limited ROM with tightness bilateral UE  SENSATION: Light touch: Deficits  left axillary region from surgery  POSTURE: forward head, rounded shoulders   UPPER EXTREMITY AROM/PROM:  A/PROM RIGHT   eval   Shoulder extension 32  Shoulder flexion 105, pulls  Shoulder abduction 126 pulls  Shoulder internal rotation   Shoulder external rotation     (Blank rows = not tested)  A/PROM LEFT   eval  Shoulder extension 35  Shoulder flexion 109 pulls  Shoulder abduction 105  Shoulder internal rotation   Shoulder external rotation     (Blank rows = not tested)  CERVICAL AROM: Right rotation, WNL Left rotation decreased 45% Right SB tight left UT, limited 20% Left SB decreased 20%, pain on left BB; WNL no pain, FB;WNL;no pain Retraction; tight  LUMBAR: FB   hands to knees BB Decreased 70% Right SB; to knee: tight on left Left SB to knee: tight on right  UPPER EXTREMITY STRENGTH: NT  TENDER; at least 14 FMS tender points   LYMPHEDEMA ASSESSMENTS:   SURGERY TYPE/DATE: 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 Said she has a compression sleeve but doesn't wear it. She does not want to be treated for lymphedema. She has been treated before but she seems to have some misconceptions. May discuss with her again at another time.  LANDMARK RIGHT  eval  10 cm proximal to olecranon process   Olecranon process   10 cm proximal to ulnar styloid process   Just proximal to ulnar styloid process   Across hand at thumb web space   At base of 2nd digit   (Blank rows = not tested)  LANDMARK LEFT  eval  10 cm proximal to olecranon process   Olecranon process   10 cm proximal to ulnar styloid process   Just proximal to ulnar styloid  process   Across hand at thumb web space   At base of 2nd digit   (Blank rows = not tested)     QUICK DASH SURVEY: 75%   TODAY'S TREATMENT:                                                                                                                                         DATE: 05/13/2022 Nu step seat 6, lev 3, UE 9 x 5 min Incline stretch 3 x 30 sec Heel raises x 10 Vector reaches x 5 B, no HH Marching on floor 2 x 10, no HH Sit to stand x 3 Ppt x 10 Ppt with march x 10 Ppt with bolster squeeze x 10 Figure 4 stretch 3 x 20 sec Ppt with march and opposite hand to knee x 10 LTR with arms outstretched x 5 ea SKTC stretch 3 x 20 Bilateral LE lengthening x 5  ea Supine AROM bilateral shoulder flexion, scaption, horizontal abduction x 5 PPT with SLR x 5 ea 05/08/2022 Nu-step seat 8, UE 11, lev 3 x 3:50 min, 120 steps fatigue in right UE 5 diaphragmatic breaths Meeks Decompression exercises x 5 ea LTR bilaterally with arms outstretched x 5, 5 sec hold PPT x 10 Ppt with marching x 10 mulitple VC's to do correctly Ppt and march with alternate hand to knee x 10 SKTC stretch x 3 B Figure 4 stretch x 3 B, Left leg much tighter DF stretch with strap  3 x 15 Supine Bilateral shoulder AROM flexion, scaption, horizontal abduction x 5 Therapist demonstrated ppt at end of treatment 05/01/2022 Nu-step seat 8, UE 11, lev 3 x 4 min  129 steps Meeks decompression exercises x 5 ea Gentle PPT x10, ppt with bolster squeeze x 10 Figure 4 stretch B 3 x 20 sec supine( tighter on left) Ppt with marching x 10 Ppt and march with alternate hand to knee x 10 Marching with alternate upper arm raises and marching x 10 LTR bilaterally x6 bilaterally with arms outstretched Ppt with bent knee fallout x 10 Supine AROM flexion, scaption, horizontal abd x 5 ea  Leg lengthening x 3 B, also did with right UE overhead, but not left due to shoulder pain.   04/29/2022  Showed pt how to use a towel roll to  support her lumbar spine. Practiced cervical retractions with VC's x 10, Scapular retractions sitting x 6 Practiced supine to and from sit to get on table x 2 x  Supine shoulder AA flexion x 4 Lower trunk rotation with arms outstetched x 5 ea Supine AROM flexion, scaption, horizontal abduction x 5 ,Meeks decompression x3 ea Updated HEP    Discussed plan of care and what will be included in her treatments. Starting slowly with flexibility and gradually introducing strength, will consider aquatic therapy  PATIENT EDUCATION:  Access Code: [email or web address] URL: [email or web address] Date: 04/29/2022 Prepared by: Cheral Almas Meeks Decompression Exercises - Supine Lower Trunk Rotation  - 1-2 x daily - 7 x weekly - 1 sets - 3 reps - 10 hold - Seated Cervical Retraction  - 1-2 x daily - 7 x weekly - 1 sets - 5 reps - Seated Scapular Retraction  - 1-2 x daily - 7 x weekly - 3 sets - 10 reps Education details: Discussed plan of care and what will be included in her treatments. Starting slowly with flexibility and gradually introducing strength, will consider aquatic therapy  Person educated: Patient Education method: Explanation Education comprehension: verbalized understanding  HOME EXERCISE PROGRAM:  - Supine Lower Trunk Rotation  - 1-2 x daily - 7 x weekly - 1 sets - 3 reps - 10 hold - Seated Cervical Retraction  - 1-2 x daily - 7 x weekly - 1 sets - 5 reps - Seated Scapular Retraction  - 1-2 x daily - 7 x weekly - 3 sets - 10 reps Meeks decompression x 3   ASSESSMENT:  CLINICAL IMPRESSION:  Left LE appears weaker with SLR and with vector reaches appears more fatigued. Fatigued after exercises. Sit to stand from chair requires a lot of effort.   OBJECTIVE IMPAIRMENTS: decreased activity tolerance, decreased endurance, decreased knowledge of condition, difficulty walking, decreased ROM, decreased strength, increased edema, impaired UE functional use, impaired  vision/preception, postural dysfunction, and pain.   ACTIVITY LIMITATIONS: carrying, lifting, bending, sitting, standing, sleeping, stairs, and reach over head  PARTICIPATION LIMITATIONS: cleaning, community activity, and yard work  PERSONAL FACTORS: Time since onset of injury/illness/exacerbation and 3+ comorbidities: left breast CA s/p chemo, FMS  are also affecting patient's functional outcome.   REHAB POTENTIAL: Good  CLINICAL DECISION MAKING: Stable/uncomplicated  EVALUATION COMPLEXITY: Low  GOALS: Goals reviewed with patient? Yes  SHORT TERM GOALS= LONG TERM GOALS: Target date: 06/04/2022  Pt will be independent  and a tolerant in a HEP for gentle ROM/stretching Baseline: Goal status: INITIAL  2.  Pt will have improved shoulder ROM by 15-20 degrees for shoulder flexion and abd for improved reaching ability Baseline: left 105, left 109 flex, Abduction Right 126, left 105 Goal status: INITIAL  3.  Pt will have full left cervical rotation without pain in left neck Baseline:  Goal status: INITIAL  4.  Pt will be tolerant of low level strengthening to improve  core,UE and LE endurance and overall strength Baseline:  Goal status: INITIAL  5.  Quick dash no greater than 30% to demonstrate improved function. PLAN:  PT FREQUENCY: 2x/week  PT DURATION: 6 weeks  PLANNED INTERVENTIONS: Therapeutic exercises, Therapeutic activity, Neuromuscular re-education, Balance training, Patient/Family education, Self Care, Joint mobilization, Orthotic/Fit training, Aquatic Therapy, Manual lymph drainage, Compression bandaging, Manual therapy, and Re-evaluation  PLAN FOR NEXT SESSION:  transition to pool 1x/ week.start slowly due to FMS, muscles all very tight, review Meeks decompression, 3 D supine shoulder AROM, KTC, gentle core stabs, gentle flexibility neck, shoulders, LB. etc Aquatics would be great to transition to but need to be sure it isn't too difficult for her, Not interested in  Lymphedema treatment;she has had before, gentle strength or aquatics if interested   [employer], PT 05/13/2022, 2:56 PM

## 2022-05-14 ENCOUNTER — Telehealth: Payer: Self-pay | Admitting: Nurse Practitioner

## 2022-05-14 NOTE — Telephone Encounter (Signed)
Patient was ordered a CT Abd/pelvis w/o contrast. Insurance changed since being ordered.

## 2022-05-14 NOTE — Telephone Encounter (Unsigned)
Copied from [place] [id number]. Topic: General - Other >> May 13, 2022  4:44 PM Dominique A wrote: Reason for CRM: Pt states that she was suppose to have a referral for a Kidney X-ray and she changed her insurance on Feb. 1st and was told that she was supposed to have an appeal for this. Please call pt back to give update.

## 2022-05-15 ENCOUNTER — Ambulatory Visit: Payer: Medicaid Other

## 2022-05-20 ENCOUNTER — Ambulatory Visit: Payer: Medicaid Other

## 2022-05-20 DIAGNOSIS — R293 Abnormal posture: Secondary | ICD-10-CM | POA: Diagnosis not present

## 2022-05-20 DIAGNOSIS — M25612 Stiffness of left shoulder, not elsewhere classified: Secondary | ICD-10-CM | POA: Diagnosis not present

## 2022-05-20 DIAGNOSIS — M6281 Muscle weakness (generalized): Secondary | ICD-10-CM | POA: Diagnosis not present

## 2022-05-20 DIAGNOSIS — M25611 Stiffness of right shoulder, not elsewhere classified: Secondary | ICD-10-CM

## 2022-05-20 DIAGNOSIS — Z853 Personal history of malignant neoplasm of breast: Secondary | ICD-10-CM

## 2022-05-20 NOTE — Therapy (Signed)
OUTPATIENT PHYSICAL THERAPY ONCOLOGY TREATMENT  Patient Name: Tonya Odonnell MRN: [id number] DOB:1965-06-25, 57 y.o., female Today's Date: 05/20/2022  END OF SESSION:  PT End of Session - 05/20/22 1202     Visit Number 6    Number of Visits 12    Date for PT Re-Evaluation 06/04/22    Authorization Type [employer]    Authorization - Visit Number 6    Authorization - Number of Visits 12    PT Start Time 1203    PT Stop Time 1247    PT Time Calculation (min) 44 min    Activity Tolerance Patient tolerated treatment well    Behavior During Therapy WFL for tasks assessed/performed             Past Medical History:  Diagnosis Date   Anemia    "USE TO HAVE IT,NOT NOW,2015,2016   Arthritis    HIPS,KNEES,HANDS   Breast cancer in female ([place])    Cancer ([place])    LEFT   Hyperlipidemia    Hypertension    Neuromuscular disorder ([place])    MUSCLE PAIN,FIBROMYALGIA   Osteopenia    PVC (premature ventricular contraction)    Thyroid disease    Past Surgical History:  Procedure Laterality Date   Left Ovarian removal Left    MASTECTOMY Left    "LYMPHNODES REMOVED   Patient Active Problem List   Diagnosis Date Noted   Acute left-sided low back pain without sciatica 0000000   Lichen sclerosus of female genitalia 12/25/2021   Adrenal adenoma, left 09/17/2021   Pain management contract signed 06/13/2021   Chronic pain syndrome 06/03/2021   Chronic arthralgias of knees and hips 05/06/2021   Osteopenia of lumbar spine 05/05/2021   Obesity 05/05/2021   Anxiety 12/05/2020   Gallstones 12/05/2020   Gastroesophageal reflux disease without esophagitis 12/05/2020   Prediabetes 12/05/2020   Hyperlipidemia 02/08/2018   Hepatitis B core antibody positive 08/07/2017   History of breast cancer 12/13/2014   Chronic low back pain 02/17/2014   Fibromyalgia 10/26/2013   Adverse reaction to influenza vaccine 11/22/2012   Essential (primary) hypertension 05/05/2012   Hashimoto's thyroiditis  05/05/2012      REFERRING PROVIDER: Wilber Bihari, NP  REFERRING DIAG: Neck and shoulder pain, lymphedema  THERAPY DIAG:  Personal history of breast cancer  Stiffness of right shoulder, not elsewhere classified  Stiffness of left shoulder, not elsewhere classified  Muscle weakness (generalized)  Abnormal posture  ONSET DATE: 2016  Rationale for Evaluation and Treatment: Rehabilitation  SUBJECTIVE:  SUBJECTIVE STATEMENT: I feel so bad I can hardly move. After last visit I couldn't get up from the couch for 3 days. Today all my muscles/joints from my head to my toes hurt. I tried the anti-depressants and they gave me more energy, but I don't take them now. I went to a pain clinic and I have tried everything but they didn't help or they had side effects. CBD oil helps some but they are so expensive and the effects don't last long.   PERTINENT HISTORY:  Pt is s/p left mastectomy in 2016 with chemotherapy and hormonal therapy which was discontinued in 2022.[name] She was diagnosed with FMS a number of years ago. She complains of  diffuse body aches and retention of fluid. She has Left UE lymphedema but she does not want to be treated for it again. After she finished chemo in 2016  she was on Nortriptyline. When she stopped it she felt a flush of water in her body and since then she has pain in muscles and skin. When she exercises the next day she feels like she can't move. She has tried several different anti depressant medications. Sometimes ibuprofen helps as long as she only takes it 1x/day. She can't sit in chairs more than 20 minutes because it bothers her back. She has been trying to find out for 7 years what is going on. She has arthritis and muscle pain all together. Recently she has a sharp knife like  pain in her LB .She was given steroids and muscle relaxer. The back pain started to resolve and she was urinating 5-6 times at night.and she started feeling so much better. She was getting extremely tired and just couldn't do much.  She is no longer on the meds and the pain is slowly starting to come back. She was diagnosed with Fibromyalgia after a swine flu vaccine. She got the vaccine on the left side and her left side of the body felt very different from the right. It got worse in 2015. After that they found the cancer in her LN's. She has swelling in the left UE, and had treatment in [place] with MLD and she has a compression sleeve but she doesn't wear it. She is not interested in being treated for lymphedema as she was treated in [place].. She has neck and left shoulder pain and has trouble turning her head to the left. She is using voltaren gel and that seems to help. She has poor sleep, difficulty walking, bending to put on shoes. Her arms fatigue very quickly. PAIN:  Are you having pain? Yes NPRS scale: /10 Pain location: entire back, left neck and shoulder, left chest, Right elbow feels tight, is sore to touch ,all my muscles head to toe Pain orientation: Left and Bilateral  PAIN TYPE: aching and throbbing Pain description: constant  Aggravating factors: can't sleep at night, difficulty walking, bending, decreased home activities, fatigues easily Relieving factors: voltaren gel, ibuprofen x 1, CBD oil  PRECAUTIONS: Other: left  UE lymphedema , FMS, OA , poor vision  WEIGHT BEARING RESTRICTIONS: No  FALLS:  Has patient fallen in last 6 months? No  LIVING ENVIRONMENT: Lives with: lives with their son Lives in: House/apartment Stairs: Yes;  Has following equipment at home:  cane, walker,   OCCUPATION: does not work  LEISURE: TV, listen to Audio books  HAND DOMINANCE: right   PRIOR LEVEL OF FUNCTION: Independent  PATIENT GOALS: Decrease pain, greater ability to be active,  clean home, (doesn't  want lymphedema treatment;has had before)   OBJECTIVE:  COGNITION: Overall cognitive status: Within functional limits for tasks assessed   PALPATION: At least 14 FMS tender points positive, tender and tight throughout neck  and entire back, shoulders  OBSERVATIONS / OTHER ASSESSMENTS: limited ROM with tightness bilateral UE  SENSATION: Light touch: Deficits   left axillary region from surgery  POSTURE: forward head, rounded shoulders   UPPER EXTREMITY AROM/PROM:  A/PROM RIGHT   eval   Shoulder extension 32  Shoulder flexion 105, pulls  Shoulder abduction 126 pulls  Shoulder internal rotation   Shoulder external rotation     (Blank rows = not tested)  A/PROM LEFT   eval  Shoulder extension 35  Shoulder flexion 109 pulls  Shoulder abduction 105  Shoulder internal rotation   Shoulder external rotation     (Blank rows = not tested)  CERVICAL AROM: Right rotation, WNL Left rotation decreased 45% Right SB tight left UT, limited 20% Left SB decreased 20%, pain on left BB; WNL no pain, FB;WNL;no pain Retraction; tight  LUMBAR: FB   hands to knees BB Decreased 70% Right SB; to knee: tight on left Left SB to knee: tight on right  UPPER EXTREMITY STRENGTH: NT  TENDER; at least 14 FMS tender points   LYMPHEDEMA ASSESSMENTS:   SURGERY TYPE/DATE: 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 Said she has a compression sleeve but doesn't wear it. She does not want to be treated for lymphedema. She has been treated before but she seems to have some misconceptions. May discuss with her again at another time.  LANDMARK RIGHT  eval  10 cm proximal to olecranon process   Olecranon process   10 cm proximal to ulnar styloid process   Just proximal to  ulnar styloid process   Across hand at thumb web space   At base of 2nd digit   (Blank rows = not tested)  LANDMARK LEFT  eval  10 cm proximal to olecranon process   Olecranon process   10 cm proximal to ulnar styloid process   Just proximal to ulnar styloid process   Across hand at thumb web space   At base of 2nd digit   (Blank rows = not tested)     QUICK DASH SURVEY: 75%   TODAY'S TREATMENT:                                                                                                                                         DATE: 05/19/2022 Nu-Step seat 6, Lev 3, UE 9 x 2.5 min then short rest and 1 more minute. Ppt x 10 Meeks Decompression exercises x 5 ea Ppt with alternate arm raises x  5 ea Ppt with marching x 5 ea Ppt with bolster squeeze  x 10, 3 sec hold Supine ppt with opposite hand to knee x 6 ea Supine FABER stretch x 3 ea 30 sec Lower trunk rotation with arms outstretched Posterior shoulder stretch x 3 Cervical rotation x 5 bilaterally Scapular retraction x 5 Called [facility] to see if they accept Medicaid but they do not. 05/13/2022 Nu step seat 6, lev 3, UE 9 x 5 min Incline stretch 3 x 30 sec Heel raises x 10 Vector reaches x 5 B, no HH Marching on floor 2 x 10, no HH Sit to stand x 3 Ppt x 10 Ppt with march x 10 Ppt with bolster squeeze x 10 Figure 4 stretch 3 x 20 sec Ppt with march and opposite hand to knee x 10 LTR with arms outstretched x 5 ea SKTC stretch 3 x 20 Bilateral LE lengthening x 5 ea Supine AROM bilateral shoulder flexion, scaption, horizontal abduction x 5 PPT with SLR x 5 ea 05/08/2022 Nu-step seat 8, UE 11, lev 3 x 3:50 min, 120 steps fatigue in right UE 5 diaphragmatic breaths Meeks Decompression exercises x 5 ea LTR bilaterally with arms outstretched x 5, 5 sec hold PPT x 10 Ppt with marching x 10 mulitple VC's to do correctly Ppt and march with alternate hand to knee x 10 SKTC stretch x 3 B Figure 4 stretch  x 3 B, Left leg much tighter DF stretch with strap  3 x 15 Supine Bilateral shoulder AROM flexion, scaption, horizontal abduction x 5 Therapist demonstrated ppt at end of treatment 05/01/2022 Nu-step seat 8, UE 11, lev 3 x 4 min  129 steps Meeks decompression exercises x 5 ea Gentle PPT x10, ppt with bolster squeeze x 10 Figure 4 stretch B 3 x 20 sec supine( tighter on left) Ppt with marching x 10 Ppt and march with alternate hand to knee x 10 Marching with alternate upper arm raises and marching x 10 LTR bilaterally x6 bilaterally with arms outstretched Ppt with bent knee fallout x 10 Supine AROM flexion, scaption, horizontal abd x 5 ea  Leg lengthening x 3 B, also did with right UE overhead, but not left due to shoulder pain.   04/29/2022  Showed pt how to use a towel roll to support her lumbar spine. Practiced cervical retractions with VC's x 10, Scapular retractions sitting x 6 Practiced supine to and from sit to get on table x 2 x  Supine shoulder AA flexion x 4 Lower trunk rotation with arms outstetched x 5 ea Supine AROM flexion, scaption, horizontal abduction x 5 ,Meeks decompression x3 ea Updated HEP    Discussed plan of care and what will be included in her treatments. Starting slowly with flexibility and gradually introducing strength, will consider aquatic therapy  PATIENT EDUCATION:  Access Code: [email or web address] URL: [email or web address] Date: 04/29/2022 Prepared by: Cheral Almas Meeks Decompression Exercises - Supine Lower Trunk Rotation  - 1-2 x daily - 7 x weekly - 1 sets - 3 reps - 10 hold - Seated Cervical Retraction  - 1-2 x daily - 7 x weekly - 1 sets - 5 reps - Seated Scapular Retraction  - 1-2 x daily - 7 x weekly - 3 sets - 10 reps Education details: Discussed plan of care and what will be included in her treatments. Starting slowly with flexibility and gradually introducing strength, will consider aquatic therapy  Person educated:  Patient Education method: Explanation Education comprehension: verbalized understanding  HOME EXERCISE PROGRAM:  - Supine Lower Trunk Rotation  - 1-2 x daily - 7 x weekly - 1 sets - 3 reps - 10 hold - Seated Cervical Retraction  - 1-2 x daily - 7 x weekly - 1 sets - 5 reps - Seated Scapular Retraction  - 1-2 x daily - 7 x weekly - 3 sets - 10 reps Meeks decompression x 3   ASSESSMENT:  CLINICAL IMPRESSION:  Pt complained of increased pain and soreness for 3 days after last visit, and wasn't able to get off the couch. Held standing exercises and focused on lower level exercises and gentle stretches today. Pt completed all exercises with today good form but she feels "different" than she did when we first started doing the exercises. She feels like she has full body lymphedema and the fluid isn't coming off. Discussed thyroid possibilities but she has had her meds checked.She cut back her visits here  to 1 x per week and was advised she must do the gentle exercises at home she was given everyday if she wants to get stronger.  OBJECTIVE IMPAIRMENTS: decreased activity tolerance, decreased endurance, decreased knowledge of condition, difficulty walking, decreased ROM, decreased strength, increased edema, impaired UE functional use, impaired vision/preception, postural dysfunction, and pain.   ACTIVITY LIMITATIONS: carrying, lifting, bending, sitting, standing, sleeping, stairs, and reach over head  PARTICIPATION LIMITATIONS: cleaning, community activity, and yard work  PERSONAL FACTORS: Time since onset of injury/illness/exacerbation and 3+ comorbidities: left breast CA s/p chemo, FMS  are also affecting patient's functional outcome.   REHAB POTENTIAL: Good  CLINICAL DECISION MAKING: Stable/uncomplicated  EVALUATION COMPLEXITY: Low  GOALS: Goals reviewed with patient? Yes  SHORT TERM GOALS= LONG TERM GOALS: Target date: 06/04/2022  Pt will be independent  and a tolerant in a HEP for  gentle ROM/stretching Baseline: Goal status: INITIAL  2.  Pt will have improved shoulder ROM by 15-20 degrees for shoulder flexion and abd for improved reaching ability Baseline: left 105, left 109 flex, Abduction Right 126, left 105 Goal status: INITIAL  3.  Pt will have full left cervical rotation without pain in left neck Baseline:  Goal status: INITIAL  4.  Pt will be tolerant of low level strengthening to improve  core,UE and LE endurance and overall strength Baseline:  Goal status: INITIAL  5.  Quick dash no greater than 30% to demonstrate improved function. PLAN:  PT FREQUENCY: 2x/week  PT DURATION: 6 weeks  PLANNED INTERVENTIONS: Therapeutic exercises, Therapeutic activity, Neuromuscular re-education, Balance training, Patient/Family education, Self Care, Joint mobilization, Orthotic/Fit training, Aquatic Therapy, Manual lymph drainage, Compression bandaging, Manual therapy, and Re-evaluation  PLAN FOR NEXT SESSION:  transition to pool 1x/ week.start slowly due to FMS, muscles all very tight, review Meeks decompression, 3 D supine shoulder AROM, KTC, gentle core stabs, gentle flexibility neck, shoulders, LB. etc Aquatics would be great to transition to but need to be sure it isn't too difficult for her, Not interested in Lymphedema treatment;she has had before, gentle strength or aquatics if interested   [employer], PT 05/20/2022, 1:02 PM

## 2022-05-21 NOTE — Telephone Encounter (Signed)
Patient was made aware that the CT abd/pelvis w/o contrast will cover her kidneys Patient verbalized understanding

## 2022-05-27 ENCOUNTER — Ambulatory Visit: Payer: Medicaid Other

## 2022-05-28 ENCOUNTER — Ambulatory Visit
Admission: RE | Admit: 2022-05-28 | Discharge: 2022-05-28 | Disposition: A | Discharge status: Home / Self Care | Payer: Medicaid Other | Source: Ambulatory Visit | Attending: Nurse Practitioner | Admitting: Nurse Practitioner

## 2022-05-28 DIAGNOSIS — E278 Other specified disorders of adrenal gland: Secondary | ICD-10-CM | POA: Diagnosis not present

## 2022-05-28 DIAGNOSIS — D3502 Benign neoplasm of left adrenal gland: Secondary | ICD-10-CM | POA: Diagnosis not present

## 2022-05-29 ENCOUNTER — Encounter: Payer: Self-pay | Admitting: Nurse Practitioner

## 2022-05-29 NOTE — Progress Notes (Signed)
Contacted via MyChart   Good afternoon Tonya Odonnell, your imaging has returned and overall adrenal adenoma remains stable with no changes, suspected to be non cancerous.  They recommend no further follow-up.  Good news!!

## 2022-06-03 ENCOUNTER — Ambulatory Visit: Payer: Medicaid Other | Admitting: Rehabilitation

## 2022-06-05 ENCOUNTER — Encounter: Payer: Medicaid Other | Admitting: Physical Therapy

## 2022-06-09 DIAGNOSIS — Z419 Encounter for procedure for purposes other than remedying health state, unspecified: Secondary | ICD-10-CM | POA: Diagnosis not present

## 2022-06-10 ENCOUNTER — Encounter: Payer: Self-pay | Admitting: Nurse Practitioner

## 2022-06-10 MED ORDER — CETIRIZINE HCL 10 MG PO TABS: 10.0000 mg | ORAL_TABLET | Freq: Every day | ORAL | 11 refills | Status: DC

## 2022-06-13 ENCOUNTER — Ambulatory Visit: Payer: Medicaid Other | Admitting: Physical Therapy

## 2022-06-13 NOTE — Therapy (Deleted)
OUTPATIENT PHYSICAL THERAPY ONCOLOGY TREATMENT  Patient Name: Tonya Odonnell MRN: [id number] DOB:11/08/1965, 56 y.o., female Today's Date: 05/20/2022  END OF SESSION:  PT End of Session - 05/20/22 1202     Visit Number 6    Number of Visits 12    Date for PT Re-Evaluation 06/04/22    Authorization Type [employer]    Authorization - Visit Number 6    Authorization - Number of Visits 12    PT Start Time 1203    PT Stop Time 1247    PT Time Calculation (min) 44 min    Activity Tolerance Patient tolerated treatment well    Behavior During Therapy WFL for tasks assessed/performed             Past Medical History:  Diagnosis Date   Anemia    "USE TO HAVE IT,NOT NOW,2015,2016   Arthritis    HIPS,KNEES,HANDS   Breast cancer in female (HCC)    Cancer (HCC)    LEFT   Hyperlipidemia    Hypertension    Neuromuscular disorder (HCC)    MUSCLE PAIN,FIBROMYALGIA   Osteopenia    PVC (premature ventricular contraction)    Thyroid disease    Past Surgical History:  Procedure Laterality Date   Left Ovarian removal Left    MASTECTOMY Left    "LYMPHNODES REMOVED   Patient Active Problem List   Diagnosis Date Noted   Acute left-sided low back pain without sciatica 03/21/2022   Lichen sclerosus of female genitalia 12/25/2021   Adrenal adenoma, left 09/17/2021   Pain management contract signed 06/13/2021   Chronic pain syndrome 06/03/2021   Chronic arthralgias of knees and hips 05/06/2021   Osteopenia of lumbar spine 05/05/2021   Obesity 05/05/2021   Anxiety 12/05/2020   Gallstones 12/05/2020   Gastroesophageal reflux disease without esophagitis 12/05/2020   Prediabetes 12/05/2020   Hyperlipidemia 02/08/2018   Hepatitis B core antibody positive 08/07/2017   History of breast cancer 12/13/2014   Chronic low back pain 02/17/2014   Fibromyalgia 10/26/2013   Adverse reaction to influenza vaccine 11/22/2012   Essential (primary) hypertension 05/05/2012   Hashimoto's thyroiditis  05/05/2012      REFERRING PROVIDER: Causey, Lindsey, NP  REFERRING DIAG: Neck and shoulder pain, lymphedema  THERAPY DIAG:  Personal history of breast cancer  Stiffness of right shoulder, not elsewhere classified  Stiffness of left shoulder, not elsewhere classified  Muscle weakness (generalized)  Abnormal posture  ONSET DATE: 2016  Rationale for Evaluation and Treatment: Rehabilitation  SUBJECTIVE:                                                                                                                                                                                             SUBJECTIVE STATEMENT: I feel so bad I can hardly move. After last visit I couldn't get up from the couch for 3 days. Today all my muscles/joints from my head to my toes hurt. I tried the anti-depressants and they gave me more energy, but I don't take them now. I went to a pain clinic and I have tried everything but they didn't help or they had side effects. CBD oil helps some but they are so expensive and the effects don't last long.   PERTINENT HISTORY:  Pt is s/p left mastectomy in 2016 with chemotherapy and hormonal therapy which was discontinued in 2022.. She was diagnosed with FMS a number of years ago. She complains of  diffuse body aches and retention of fluid. She has Left UE lymphedema but she does not want to be treated for it again. After she finished chemo in 2016  she was on Nortriptyline. When she stopped it she felt a flush of water in her body and since then she has pain in muscles and skin. When she exercises the next day she feels like she can't move. She has tried several different anti depressant medications. Sometimes ibuprofen helps as long as she only takes it 1x/day. She can't sit in chairs more than 20 minutes because it bothers her back. She has been trying to find out for 7 years what is going on. She has arthritis and muscle pain all together. Recently she has a sharp knife like  pain in her LB .She was given steroids and muscle relaxer. The back pain started to resolve and she was urinating 5-6 times at night.and she started feeling so much better. She was getting extremely tired and just couldn't do much.  She is no longer on the meds and the pain is slowly starting to come back. She was diagnosed with Fibromyalgia after a swine flu vaccine. She got the vaccine on the left side and her left side of the body felt very different from the right. It got worse in 2015. After that they found the cancer in her LN's. She has swelling in the left UE, and had treatment in [place] with MLD and she has a compression sleeve but she doesn't wear it. She is not interested in being treated for lymphedema as she was treated in [place].. She has neck and left shoulder pain and has trouble turning her head to the left. She is using voltaren gel and that seems to help. She has poor sleep, difficulty walking, bending to put on shoes. Her arms fatigue very quickly. PAIN:  Are you having pain? Yes NPRS scale: /10 Pain location: entire back, left neck and shoulder, left chest, Right elbow feels tight, is sore to touch ,all my muscles head to toe Pain orientation: Left and Bilateral  PAIN TYPE: aching and throbbing Pain description: constant  Aggravating factors: can't sleep at night, difficulty walking, bending, decreased home activities, fatigues easily Relieving factors: voltaren gel, ibuprofen x 1, CBD oil  PRECAUTIONS: Other: left  UE lymphedema , FMS, OA , poor vision  WEIGHT BEARING RESTRICTIONS: No  FALLS:  Has patient fallen in last 6 months? No  LIVING ENVIRONMENT: Lives with: lives with their son Lives in: House/apartment Stairs: Yes;  Has following equipment at home:  cane, walker,   OCCUPATION: does not work  LEISURE: TV, listen to Audio books  HAND DOMINANCE: right   PRIOR LEVEL OF FUNCTION: Independent  PATIENT GOALS: Decrease pain, greater ability to be active,  clean home, (doesn't   want lymphedema treatment;has had before)   OBJECTIVE:  COGNITION: Overall cognitive status: Within functional limits for tasks assessed   PALPATION: At least 14 FMS tender points positive, tender and tight throughout neck  and entire back, shoulders  OBSERVATIONS / OTHER ASSESSMENTS: limited ROM with tightness bilateral UE  SENSATION: Light touch: Deficits   left axillary region from surgery  POSTURE: forward head, rounded shoulders   UPPER EXTREMITY AROM/PROM:  A/PROM RIGHT   eval   Shoulder extension 32  Shoulder flexion 105, pulls  Shoulder abduction 126 pulls  Shoulder internal rotation   Shoulder external rotation     (Blank rows = not tested)  A/PROM LEFT   eval  Shoulder extension 35  Shoulder flexion 109 pulls  Shoulder abduction 105  Shoulder internal rotation   Shoulder external rotation     (Blank rows = not tested)  CERVICAL AROM: Right rotation, WNL Left rotation decreased 45% Right SB tight left UT, limited 20% Left SB decreased 20%, pain on left BB; WNL no pain, FB;WNL;no pain Retraction; tight  LUMBAR: FB   hands to knees BB Decreased 70% Right SB; to knee: tight on left Left SB to knee: tight on right  UPPER EXTREMITY STRENGTH: NT  TENDER; at least 14 FMS tender points   LYMPHEDEMA ASSESSMENTS:   SURGERY TYPE/DATE: 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 Said she has a compression sleeve but doesn't wear it. She does not want to be treated for lymphedema. She has been treated before but she seems to have some misconceptions. May discuss with her again at another time.  LANDMARK RIGHT  eval  10 cm proximal to olecranon process   Olecranon process   10 cm proximal to ulnar styloid process   Just proximal to  ulnar styloid process   Across hand at thumb web space   At base of 2nd digit   (Blank rows = not tested)  LANDMARK LEFT  eval  10 cm proximal to olecranon process   Olecranon process   10 cm proximal to ulnar styloid process   Just proximal to ulnar styloid process   Across hand at thumb web space   At base of 2nd digit   (Blank rows = not tested)     QUICK DASH SURVEY: 75%   TODAY'S TREATMENT:                                                                                                                                         DATE: 05/19/2022 Nu-Step seat 6, Lev 3, UE 9 x 2.5 min then short rest and 1 more minute. Ppt x 10 Meeks Decompression exercises x 5 ea Ppt with alternate arm raises x   5 ea Ppt with marching x 5 ea Ppt with bolster squeeze  x 10, 3 sec hold Supine ppt with opposite hand to knee x 6 ea Supine FABER stretch x 3 ea 30 sec Lower trunk rotation with arms outstretched Posterior shoulder stretch x 3 Cervical rotation x 5 bilaterally Scapular retraction x 5 Called [facility] to see if they accept Medicaid but they do not. 05/13/2022 Nu step seat 6, lev 3, UE 9 x 5 min Incline stretch 3 x 30 sec Heel raises x 10 Vector reaches x 5 B, no HH Marching on floor 2 x 10, no HH Sit to stand x 3 Ppt x 10 Ppt with march x 10 Ppt with bolster squeeze x 10 Figure 4 stretch 3 x 20 sec Ppt with march and opposite hand to knee x 10 LTR with arms outstretched x 5 ea SKTC stretch 3 x 20 Bilateral LE lengthening x 5 ea Supine AROM bilateral shoulder flexion, scaption, horizontal abduction x 5 PPT with SLR x 5 ea 05/08/2022 Nu-step seat 8, UE 11, lev 3 x 3:50 min, 120 steps fatigue in right UE 5 diaphragmatic breaths Meeks Decompression exercises x 5 ea LTR bilaterally with arms outstretched x 5, 5 sec hold PPT x 10 Ppt with marching x 10 mulitple VC's to do correctly Ppt and march with alternate hand to knee x 10 SKTC stretch x 3 B Figure 4 stretch  x 3 B, Left leg much tighter DF stretch with strap  3 x 15 Supine Bilateral shoulder AROM flexion, scaption, horizontal abduction x 5 Therapist demonstrated ppt at end of treatment 05/01/2022 Nu-step seat 8, UE 11, lev 3 x 4 min  129 steps Meeks decompression exercises x 5 ea Gentle PPT x10, ppt with bolster squeeze x 10 Figure 4 stretch B 3 x 20 sec supine( tighter on left) Ppt with marching x 10 Ppt and march with alternate hand to knee x 10 Marching with alternate upper arm raises and marching x 10 LTR bilaterally x6 bilaterally with arms outstretched Ppt with bent knee fallout x 10 Supine AROM flexion, scaption, horizontal abd x 5 ea  Leg lengthening x 3 B, also did with right UE overhead, but not left due to shoulder pain.   04/29/2022  Showed pt how to use a towel roll to support her lumbar spine. Practiced cervical retractions with VC's x 10, Scapular retractions sitting x 6 Practiced supine to and from sit to get on table x 2 x  Supine shoulder AA flexion x 4 Lower trunk rotation with arms outstetched x 5 ea Supine AROM flexion, scaption, horizontal abduction x 5 ,Meeks decompression x3 ea Updated HEP    Discussed plan of care and what will be included in her treatments. Starting slowly with flexibility and gradually introducing strength, will consider aquatic therapy  PATIENT EDUCATION:  Access Code: [email or web address] URL: [email or web address] Date: 04/29/2022 Prepared by: Robin Walcott Meeks Decompression Exercises - Supine Lower Trunk Rotation  - 1-2 x daily - 7 x weekly - 1 sets - 3 reps - 10 hold - Seated Cervical Retraction  - 1-2 x daily - 7 x weekly - 1 sets - 5 reps - Seated Scapular Retraction  - 1-2 x daily - 7 x weekly - 3 sets - 10 reps Education details: Discussed plan of care and what will be included in her treatments. Starting slowly with flexibility and gradually introducing strength, will consider aquatic therapy  Person educated:  Patient Education method: Explanation Education comprehension: verbalized understanding    HOME EXERCISE PROGRAM:  - Supine Lower Trunk Rotation  - 1-2 x daily - 7 x weekly - 1 sets - 3 reps - 10 hold - Seated Cervical Retraction  - 1-2 x daily - 7 x weekly - 1 sets - 5 reps - Seated Scapular Retraction  - 1-2 x daily - 7 x weekly - 3 sets - 10 reps Meeks decompression x 3   ASSESSMENT:  CLINICAL IMPRESSION:  Pt complained of increased pain and soreness for 3 days after last visit, and wasn't able to get off the couch. Held standing exercises and focused on lower level exercises and gentle stretches today. Pt completed all exercises with today good form but she feels "different" than she did when we first started doing the exercises. She feels like she has full body lymphedema and the fluid isn't coming off. Discussed thyroid possibilities but she has had her meds checked.She cut back her visits here  to 1 x per week and was advised she must do the gentle exercises at home she was given everyday if she wants to get stronger.  OBJECTIVE IMPAIRMENTS: decreased activity tolerance, decreased endurance, decreased knowledge of condition, difficulty walking, decreased ROM, decreased strength, increased edema, impaired UE functional use, impaired vision/preception, postural dysfunction, and pain.   ACTIVITY LIMITATIONS: carrying, lifting, bending, sitting, standing, sleeping, stairs, and reach over head  PARTICIPATION LIMITATIONS: cleaning, community activity, and yard work  PERSONAL FACTORS: Time since onset of injury/illness/exacerbation and 3+ comorbidities: left breast CA s/p chemo, FMS  are also affecting patient's functional outcome.   REHAB POTENTIAL: Good  CLINICAL DECISION MAKING: Stable/uncomplicated  EVALUATION COMPLEXITY: Low  GOALS: Goals reviewed with patient? Yes  SHORT TERM GOALS= LONG TERM GOALS: Target date: 06/04/2022  Pt will be independent  and a tolerant in a HEP for  gentle ROM/stretching Baseline: Goal status: INITIAL  2.  Pt will have improved shoulder ROM by 15-20 degrees for shoulder flexion and abd for improved reaching ability Baseline: left 105, left 109 flex, Abduction Right 126, left 105 Goal status: INITIAL  3.  Pt will have full left cervical rotation without pain in left neck Baseline:  Goal status: INITIAL  4.  Pt will be tolerant of low level strengthening to improve  core,UE and LE endurance and overall strength Baseline:  Goal status: INITIAL  5.  Quick dash no greater than 30% to demonstrate improved function. PLAN:  PT FREQUENCY: 2x/week  PT DURATION: 6 weeks  PLANNED INTERVENTIONS: Therapeutic exercises, Therapeutic activity, Neuromuscular re-education, Balance training, Patient/Family education, Self Care, Joint mobilization, Orthotic/Fit training, Aquatic Therapy, Manual lymph drainage, Compression bandaging, Manual therapy, and Re-evaluation  PLAN FOR NEXT SESSION:  transition to pool 1x/ week.start slowly due to FMS, muscles all very tight, review Meeks decompression, 3 D supine shoulder AROM, KTC, gentle core stabs, gentle flexibility neck, shoulders, LB. etc Aquatics would be great to transition to but need to be sure it isn't too difficult for her, Not interested in Lymphedema treatment;she has had before, gentle strength or aquatics if interested   Robin J Walcott, PT 05/20/2022, 1:02 PM 

## 2022-06-17 ENCOUNTER — Ambulatory Visit: Payer: Medicaid Other | Admitting: Nurse Practitioner

## 2022-06-17 DIAGNOSIS — G894 Chronic pain syndrome: Secondary | ICD-10-CM

## 2022-06-17 DIAGNOSIS — E063 Autoimmune thyroiditis: Secondary | ICD-10-CM

## 2022-06-17 DIAGNOSIS — E782 Mixed hyperlipidemia: Secondary | ICD-10-CM

## 2022-06-17 DIAGNOSIS — K219 Gastro-esophageal reflux disease without esophagitis: Secondary | ICD-10-CM

## 2022-06-17 DIAGNOSIS — R7303 Prediabetes: Secondary | ICD-10-CM

## 2022-06-17 DIAGNOSIS — Z853 Personal history of malignant neoplasm of breast: Secondary | ICD-10-CM

## 2022-06-17 DIAGNOSIS — E6609 Other obesity due to excess calories: Secondary | ICD-10-CM

## 2022-06-17 DIAGNOSIS — I10 Essential (primary) hypertension: Secondary | ICD-10-CM

## 2022-06-17 DIAGNOSIS — F419 Anxiety disorder, unspecified: Secondary | ICD-10-CM

## 2022-06-17 DIAGNOSIS — M797 Fibromyalgia: Secondary | ICD-10-CM

## 2022-06-18 ENCOUNTER — Encounter: Payer: Self-pay | Admitting: Nurse Practitioner

## 2022-06-18 ENCOUNTER — Ambulatory Visit: Payer: Medicaid Other | Admitting: Physical Therapy

## 2022-07-06 NOTE — Patient Instructions (Incomplete)
[facility] in [place] -- [place] for 10 water aerobic classes  Be Involved in Your Health Care:  Taking Medications When medications are taken as directed, they can greatly improve your health. But if they are not taken as instructed, they may not work. In some cases, not taking them correctly can be harmful. To help ensure your treatment remains effective and safe, understand your medications and how to take them.  Your lab results, notes and after visit summary will be available on My Chart. We strongly encourage you to use this feature. If lab results are abnormal the clinic will contact you with the appropriate steps. If the clinic does not contact you assume the results are satisfactory. You can always see your results on My Chart. If you have questions regarding your condition, please contact the clinic during office hours. You can also ask questions on My Chart.  We at [facility] are grateful that you chose [place] to provide care. We strive to provide excellent and compassionate care and are always looking for feedback. If you get a survey from the clinic please complete this.    Healthy Eating, Adult Healthy eating may help you get and keep a healthy body weight, reduce the risk of chronic disease, and live a long and productive life. It is important to follow a healthy eating pattern. Your nutritional and calorie needs should be met mainly by different nutrient-rich foods. What are tips for following this plan? Reading food labels Read labels and choose the following: Reduced or low sodium products. Juices with 100% fruit juice. Foods with low saturated fats (<3 g per serving) and high polyunsaturated and monounsaturated fats. Foods with whole grains, such as whole wheat, cracked wheat, brown rice, and wild rice. Whole grains that are fortified with folic acid. This is recommended for females who are pregnant or who want to become pregnant. Read labels and do not  eat or drink the following: Foods or drinks with added sugars. These include foods that contain brown sugar, corn sweetener, corn syrup, dextrose, fructose, glucose, high-fructose corn syrup, honey, invert sugar, lactose, malt syrup, maltose, molasses, raw sugar, sucrose, trehalose, or turbinado sugar. Limit your intake of added sugars to less than 10% of your total daily calories. Do not eat more than the following amounts of added sugar per day: 6 teaspoons (25 g) for females. 9 teaspoons (38 g) for males. Foods that contain processed or refined starches and grains. Refined grain products, such as white flour, degermed cornmeal, white bread, and white rice. Shopping Choose nutrient-rich snacks, such as vegetables, whole fruits, and nuts. Avoid high-calorie and high-sugar snacks, such as potato chips, fruit snacks, and candy. Use oil-based dressings and spreads on foods instead of solid fats such as butter, margarine, sour cream, or cream cheese. Limit pre-made sauces, mixes, and "instant" products such as flavored rice, instant noodles, and ready-made pasta. Try more plant-protein sources, such as tofu, tempeh, black beans, edamame, lentils, nuts, and seeds. Explore eating plans such as the Mediterranean diet or vegetarian diet. Try heart-healthy dips made with beans and healthy fats like hummus and guacamole. Vegetables go great with these. Cooking Use oil to saut or stir-fry foods instead of solid fats such as butter, margarine, or lard. Try baking, boiling, grilling, or broiling instead of frying. Remove the fatty part of meats before cooking. Steam vegetables in water or broth. Meal planning  At meals, imagine dividing your plate into fourths: One-half of your plate is fruits and vegetables.  One-fourth of your plate is whole grains. One-fourth of your plate is protein, especially lean meats, poultry, eggs, tofu, beans, or nuts. Include low-fat dairy as part of your daily  diet. Lifestyle Choose healthy options in all settings, including home, work, school, restaurants, or stores. Prepare your food safely: Wash your hands after handling raw meats. Where you prepare food, keep surfaces clean by regularly washing with hot, soapy water. Keep raw meats separate from ready-to-eat foods, such as fruits and vegetables. Cook seafood, meat, poultry, and eggs to the recommended temperature. Get a food thermometer. Store foods at safe temperatures. In general: Keep cold foods at 64F (4.4C) or below. Keep hot foods at 164F (60C) or above. Keep your freezer at [facility] (-17.8C) or below. Foods are not safe to eat if they have been between the temperatures of 40-164F (4.4-60C) for more than 2 hours. What foods should I eat? Fruits Aim to eat 1-2 cups of fresh, canned (in natural juice), or frozen fruits each day. One cup of fruit equals 1 small apple, 1 large banana, 8 large strawberries, 1 cup (237 g) canned fruit,  cup (82 g) dried fruit, or 1 cup (240 mL) 100% juice. Vegetables Aim to eat 2-4 cups of fresh and frozen vegetables each day, including different varieties and colors. One cup of vegetables equals 1 cup (91 g) broccoli or cauliflower florets, 2 medium carrots, 2 cups (150 g) raw, leafy greens, 1 large tomato, 1 large bell pepper, 1 large sweet potato, or 1 medium white potato. Grains Aim to eat 5-10 ounce-equivalents of whole grains each day. Examples of 1 ounce-equivalent of grains include 1 slice of bread, 1 cup (40 g) ready-to-eat cereal, 3 cups (24 g) popcorn, or  cup (93 g) cooked rice. Meats and other proteins Try to eat 5-7 ounce-equivalents of protein each day. Examples of 1 ounce-equivalent of protein include 1 egg,  oz nuts (12 almonds, 24 pistachios, or 7 walnut halves), 1/4 cup (90 g) cooked beans, 6 tablespoons (90 g) hummus or 1 tablespoon (16 g) peanut butter. A cut of meat or fish that is the size of a deck of cards is about 3-4  ounce-equivalents (85 g). Of the protein you eat each week, try to have at least 8 sounce (227 g) of seafood. This is about 2 servings per week. This includes salmon, trout, herring, sardines, and anchovies. Dairy Aim to eat 3 cup-equivalents of fat-free or low-fat dairy each day. Examples of 1 cup-equivalent of dairy include 1 cup (240 mL) milk, 8 ounces (250 g) yogurt, 1 ounces (44 g) natural cheese, or 1 cup (240 mL) fortified soy milk. Fats and oils Aim for about 5 teaspoons (21 g) of fats and oils per day. Choose monounsaturated fats, such as canola and olive oils, mayonnaise made with olive oil or avocado oil, avocados, peanut butter, and most nuts, or polyunsaturated fats, such as sunflower, corn, and soybean oils, walnuts, pine nuts, sesame seeds, sunflower seeds, and flaxseed. Beverages Aim for 6 eight-ounce glasses of water per day. Limit coffee to 3-5 eight-ounce cups per day. Limit caffeinated beverages that have added calories, such as soda and energy drinks. If you drink alcohol: Limit how much you have to: 0-1 drink a day if you are female. 0-2 drinks a day if you are female. Know how much alcohol is in your drink. In the [place] one drink is one 12 oz bottle of beer (355 mL), one 5 oz glass of wine (148 mL), or one 1 oz  glass of hard liquor (44 mL). Seasoning and other foods Try not to add too much salt to your food. Try using herbs and spices instead of salt. Try not to add sugar to food. This information is based on [place] nutrition guidelines. To learn more, visit DisposableNylon.be. Exact amounts may vary. You may need different amounts. This information is not intended to replace advice given to you by your health care provider. Make sure you discuss any questions you have with your health care provider. Document Revised: 11/25/2021 Document Reviewed: 11/25/2021 Elsevier Patient Education  2023 [employer].

## 2022-07-08 ENCOUNTER — Encounter: Payer: Self-pay | Admitting: Nurse Practitioner

## 2022-07-08 ENCOUNTER — Ambulatory Visit: Payer: Medicaid Other | Admitting: Nurse Practitioner

## 2022-07-08 VITALS — BP 137/84 | HR 58 | Temp 98.3°F | Ht 62.01 in | Wt 192.0 lb

## 2022-07-08 DIAGNOSIS — E063 Autoimmune thyroiditis: Secondary | ICD-10-CM

## 2022-07-08 DIAGNOSIS — R7303 Prediabetes: Secondary | ICD-10-CM

## 2022-07-08 DIAGNOSIS — E782 Mixed hyperlipidemia: Secondary | ICD-10-CM | POA: Diagnosis not present

## 2022-07-08 DIAGNOSIS — Z6833 Body mass index (BMI) 33.0-33.9, adult: Secondary | ICD-10-CM

## 2022-07-08 DIAGNOSIS — I10 Essential (primary) hypertension: Secondary | ICD-10-CM | POA: Diagnosis not present

## 2022-07-08 DIAGNOSIS — G894 Chronic pain syndrome: Secondary | ICD-10-CM | POA: Diagnosis not present

## 2022-07-08 DIAGNOSIS — D3502 Benign neoplasm of left adrenal gland: Secondary | ICD-10-CM | POA: Diagnosis not present

## 2022-07-08 DIAGNOSIS — Z853 Personal history of malignant neoplasm of breast: Secondary | ICD-10-CM | POA: Diagnosis not present

## 2022-07-08 DIAGNOSIS — M797 Fibromyalgia: Secondary | ICD-10-CM

## 2022-07-08 DIAGNOSIS — F419 Anxiety disorder, unspecified: Secondary | ICD-10-CM | POA: Diagnosis not present

## 2022-07-08 DIAGNOSIS — M8588 Other specified disorders of bone density and structure, other site: Secondary | ICD-10-CM

## 2022-07-08 DIAGNOSIS — K219 Gastro-esophageal reflux disease without esophagitis: Secondary | ICD-10-CM | POA: Diagnosis not present

## 2022-07-08 DIAGNOSIS — E6609 Other obesity due to excess calories: Secondary | ICD-10-CM

## 2022-07-08 LAB — MICROALBUMIN, URINE WAIVED
Creatinine, Urine Waived: 200 mg/dL (ref 10–300)
Microalb, Ur Waived: 80 mg/L — ABNORMAL HIGH (ref 0–19)

## 2022-07-08 LAB — BAYER DCA HB A1C WAIVED: HB A1C (BAYER DCA - WAIVED): 5.7 % — ABNORMAL HIGH (ref 4.8–5.6)

## 2022-07-08 MED ORDER — CETIRIZINE-PSEUDOEPHEDRINE ER 5-120 MG PO TB12: 1.0000 | ORAL_TABLET | Freq: Two times a day (BID) | ORAL | 4 refills | Status: DC

## 2022-07-08 NOTE — Assessment & Plan Note (Signed)
Refer to fibromyalgia plan of care. 

## 2022-07-08 NOTE — Assessment & Plan Note (Signed)
Chronic, ongoing with ASCVD 2.9%.  Recommend continued focus on diet and regular activity. Lipid panel today.

## 2022-07-08 NOTE — Assessment & Plan Note (Signed)
Chronic, ongoing.  Currently recommend she continue home regimen.  Has not tolerated multiple medications.

## 2022-07-08 NOTE — Assessment & Plan Note (Signed)
Chronic, ongoing. Continue current medication regimen and adjust as needed based on labs.  TSH and Free T4 today. 

## 2022-07-08 NOTE — Assessment & Plan Note (Addendum)
Ongoing, past imaging.  Recommend she continue Vitamin D3 2000 units daily.  Labs up to date.  Repeat DEXA 02/15/2026.

## 2022-07-08 NOTE — Addendum Note (Signed)
Addended by: Aura Dials T on: 07/08/2022 01:15 PM   Modules accepted: Orders

## 2022-07-08 NOTE — Assessment & Plan Note (Signed)
Repeat imaging on 05/28/22 showed stable adenoma at 1 cm, no follow-up recommended.

## 2022-07-08 NOTE — Progress Notes (Signed)
BP 137/84   Pulse (!) 58   Temp 98.3 F (36.8 C) (Oral)   Ht 5' 2.01" (1.575 m)   Wt 192 lb (87.1 kg)   LMP  (LMP Unknown)   SpO2 95%   BMI 35.11 kg/m    Subjective:    Patient ID: Tonya Odonnell, female    DOB: Oct 27, 1965, 57 y.o.   MRN: [id number]  HPI: Tonya Odonnell is a 57 y.o. female  Chief Complaint  Patient presents with   Back Pain   HYPERTENSION / HYPERLIPIDEMIA Continues on Metoprolol XR 50 MG BID, every 3-4 days takes a HCTZ to help with blood pressure and inflammation.  History of elevation on sugars in past, last A1c 5.4%.   Satisfied with current treatment? yes Duration of hypertension: chronic BP monitoring frequency: not checking BP range:  BP medication side effects: no Past BP meds:  Cholesterol supplements: none Aspirin: no Recent stressors: no Recurrent headaches: no Visual changes: no Palpitations: no Dyspnea: no Chest pain: no Lower extremity edema: no Dizzy/lightheaded: no  The 10-year ASCVD risk score (Arnett DK, et al., 2019) is: 2.9%   Values used to calculate the score:     Age: 39 years     Sex: Female     Is Non-Hispanic African American: No     Diabetic: No     Tobacco smoker: No     Systolic Blood Pressure: 137 mmHg     Is BP treated: Yes     HDL Cholesterol: 76 mg/dL     Total Cholesterol: 232 mg/dL   ARTHRALGIAS / JOINT ACHES/ FIBROMYALGIA Has tried Cymbalta, Lyrica, and Gabapentin without benefit.  Saw pain clinic 06/13/21 and was provided Tramadol, she reports no benefit from this visit, makes her feel bad.  Saw rheumatology last 04/10/22 with [facility], they recommended PT -- currently doing physical therapy.  Osteopenia present, taking supplement.   Has pain 24/7 to every muscle and bone. Duration: months -- chronic tall over Pain: yes Symmetric: yes, 7-8/10 Quality: dull, aching, and throbbing Frequency: constant Context:  worse Decreased function/range of motion: yes  Erythema: none Swelling: yes Heat or warmth:  yes Morning stiffness: yes Aggravating factors: unknown Alleviating factors: only not moving Relief with NSAIDs?: moderate Treatments attempted:  Ibuprofen and CBD oil -- and as above Involved Joints: all joints -- all bones   HYPOTHYROIDISM Taking Levothyroxine 125 MCG daily.   Thyroid control status:stable Satisfied with current treatment? yes Medication side effects: no Medication compliance: good compliance Etiology of hypothyroidism: unknown Recent dose adjustment: no Fatigue: yes Cold intolerance: no Heat intolerance: no Weight gain: no Weight loss: no Constipation: no Diarrhea/loose stools: no Palpitations: no Lower extremity edema: no Anxiety/depressed mood: no    BREAST CANCER: History of breast cancer in 2016, left side.  Saw oncology last 04/21/22.  Last mammogram 08/14/21.  History of chemo.  Her cancer started after swine flu vaccine she reports.  Adrenal mass on CT abd/pelvis ordered by oncology which is suspected to be benign adenoma - had repeat imaging which noted stable 1 cm left adrenal adenoma -- no follow-up needed.  Relevant past medical, surgical, family and social history reviewed and updated as indicated. Interim medical history since our last visit reviewed. Allergies and medications reviewed and updated.  Review of Systems  Constitutional:  Negative for activity change, appetite change, diaphoresis, fatigue and fever.  Respiratory:  Negative for cough, chest tightness and shortness of breath.   Cardiovascular:  Negative for chest pain, palpitations and leg  swelling.  Gastrointestinal: Negative.   Endocrine: Negative for cold intolerance, heat intolerance, polydipsia, polyphagia and polyuria.  Musculoskeletal:  Positive for arthralgias.  Neurological: Negative.   Psychiatric/Behavioral: Negative.      Per HPI unless specifically indicated above     Objective:    BP 137/84   Pulse (!) 58   Temp 98.3 F (36.8 C) (Oral)   Ht 5' 2.01" (1.575 m)    Wt 192 lb (87.1 kg)   LMP  (LMP Unknown)   SpO2 95%   BMI 35.11 kg/m   Wt Readings from Last 3 Encounters:  07/08/22 192 lb (87.1 kg)  04/15/22 192 lb 3.2 oz (87.2 kg)  04/08/22 193 lb 1.6 oz (87.6 kg)    Physical Exam Vitals and nursing note reviewed.  Constitutional:      General: She is awake. She is not in acute distress.    Appearance: She is well-developed and well-groomed. She is obese. She is not ill-appearing or toxic-appearing.  HENT:     Head: Normocephalic.     Right Ear: Hearing normal.     Left Ear: Hearing normal.  Eyes:     General: Lids are normal.        Right eye: No discharge.        Left eye: No discharge.     Conjunctiva/sclera: Conjunctivae normal.     Pupils: Pupils are equal, round, and reactive to light.  Neck:     Thyroid: No thyromegaly.     Vascular: No carotid bruit.  Cardiovascular:     Rate and Rhythm: Normal rate and regular rhythm.     Heart sounds: Normal heart sounds. No murmur heard.    No gallop.  Pulmonary:     Effort: Pulmonary effort is normal. No accessory muscle usage or respiratory distress.     Breath sounds: Normal breath sounds.  Abdominal:     General: Bowel sounds are normal.     Palpations: Abdomen is soft. There is no hepatomegaly or splenomegaly.  Musculoskeletal:     Cervical back: Normal range of motion and neck supple.     Right lower leg: No edema.     Left lower leg: No edema.  Lymphadenopathy:     Cervical: No cervical adenopathy.  Skin:    General: Skin is warm and dry.  Neurological:     Mental Status: She is alert and oriented to person, place, and time.  Psychiatric:        Attention and Perception: Attention normal.        Mood and Affect: Mood normal.        Speech: Speech normal.        Behavior: Behavior normal. Behavior is cooperative.        Thought Content: Thought content normal.     Results for orders placed or performed in visit on 07/08/22  Bayer DCA Hb A1c Waived  Result Value Ref  Range   HB A1C (BAYER DCA - WAIVED) 5.7 (H) 4.8 - 5.6 %  Microalbumin, Urine Waived  Result Value Ref Range   Microalb, Ur Waived 80 (H) 0 - 19 mg/L   Creatinine, Urine Waived 200 10 - 300 mg/dL   Microalb/Creat Ratio 30-300 (H) <30 mg/g      Assessment & Plan:   Problem List Items Addressed This Visit       Cardiovascular and Mediastinum   Essential (primary) hypertension - Primary    Chronic, ongoing.  BP close to goal in office  today.  Recommend she monitor BP at least a few mornings a week at home and document.  DASH diet at home.  Continue Metoprolol at current dosing and HCTZ 12.5 MG daily as needed.  Labs: CBC, CMP, urine ALB, TSH.  Urine ALB 80 April 2024, may benefit ARB trial in future.  Did not tolerate ACE.  Return in 6 months.      Relevant Medications   hydrochlorothiazide (HYDRODIURIL) 12.5 MG tablet   Other Relevant Orders   CBC with Differential/Platelet     Endocrine   Adrenal adenoma, left    Repeat imaging on 05/28/22 showed stable adenoma at 1 cm, no follow-up recommended.      Hashimoto's thyroiditis    Chronic, ongoing. Continue current medication regimen and adjust as needed based on labs.  TSH and Free T4 today.      Relevant Orders   T4, free   TSH     Musculoskeletal and Integument   Osteopenia of lumbar spine    Ongoing, past imaging.  Recommend she continue Vitamin D3 2000 units daily.  Labs up to date.  Repeat DEXA 02/15/2026.        Other   Anxiety   Chronic pain syndrome    Refer to fibromyalgia plan of care.      Fibromyalgia    Chronic, ongoing.  Currently recommend she continue home regimen.  Has not tolerated multiple medications.      Relevant Orders   Magnesium   History of breast cancer    History of in 2016 -- continue collaboration with oncology and plastic surgery as needed.  Mammograms annually.      Hyperlipidemia    Chronic, ongoing with ASCVD 2.9%.  Recommend continued focus on diet and regular activity. Lipid  panel today.      Relevant Medications   hydrochlorothiazide (HYDRODIURIL) 12.5 MG tablet   Other Relevant Orders   Comprehensive metabolic panel   Lipid Panel w/o Chol/HDL Ratio   Obesity    BMI 35.11.  Recommended eating smaller high protein, low fat meals more frequently and exercising 30 mins a day 5 times a week with a goal of 10-15lb weight loss in the next 3 months. Patient voiced their understanding and motivation to adhere to these recommendations.      Prediabetes    Noted on past labs, check level today and recommend diet focus.  Level 5.7% today, highly recommend diet and exercise focus.      Relevant Orders   Bayer DCA Hb A1c Waived (Completed)   Microalbumin, Urine Waived (Completed)     Follow up plan: Return in about 6 months (around 01/07/2023) for HTN/HLD, FIBROMYALGIA, H/O BREAST CA, THYROID.

## 2022-07-08 NOTE — Assessment & Plan Note (Signed)
History of in 2016 -- continue collaboration with oncology and plastic surgery as needed.  Mammograms annually. 

## 2022-07-08 NOTE — Assessment & Plan Note (Signed)
Noted on past labs, check level today and recommend diet focus.  Level 5.7% today, highly recommend diet and exercise focus.

## 2022-07-08 NOTE — Assessment & Plan Note (Signed)
BMI 35.11.  Recommended eating smaller high protein, low fat meals more frequently and exercising 30 mins a day 5 times a week with a goal of 10-15lb weight loss in the next 3 months. Patient voiced their understanding and motivation to adhere to these recommendations.

## 2022-07-08 NOTE — Assessment & Plan Note (Signed)
Chronic, ongoing.  BP close to goal in office today.  Recommend she monitor BP at least a few mornings a week at home and document.  DASH diet at home.  Continue Metoprolol at current dosing and HCTZ 12.5 MG daily as needed.  Labs: CBC, CMP, urine ALB, TSH.  Urine ALB 80 April 2024, may benefit ARB trial in future.  Did not tolerate ACE.  Return in 6 months.

## 2022-07-09 DIAGNOSIS — Z419 Encounter for procedure for purposes other than remedying health state, unspecified: Secondary | ICD-10-CM | POA: Diagnosis not present

## 2022-07-09 LAB — CBC WITH DIFFERENTIAL/PLATELET
Basophils Absolute: 0.1 10*3/uL (ref 0.0–0.2)
Basos: 1 %
EOS (ABSOLUTE): 0.1 10*3/uL (ref 0.0–0.4)
Eos: 1 %
Hematocrit: 48.3 % — ABNORMAL HIGH (ref 34.0–46.6)
Hemoglobin: 15.7 g/dL (ref 11.1–15.9)
Immature Grans (Abs): 0 10*3/uL (ref 0.0–0.1)
Immature Granulocytes: 0 %
Lymphocytes Absolute: 3 10*3/uL (ref 0.7–3.1)
Lymphs: 39 %
MCH: 29 pg (ref 26.6–33.0)
MCHC: 32.5 g/dL (ref 31.5–35.7)
MCV: 89 fL (ref 79–97)
Monocytes Absolute: 0.6 10*3/uL (ref 0.1–0.9)
Monocytes: 7 %
Neutrophils Absolute: 4.1 10*3/uL (ref 1.4–7.0)
Neutrophils: 52 %
Platelets: 328 10*3/uL (ref 150–450)
RBC: 5.42 x10E6/uL — ABNORMAL HIGH (ref 3.77–5.28)
RDW: 13.7 % (ref 11.7–15.4)
WBC: 7.9 10*3/uL (ref 3.4–10.8)

## 2022-07-09 LAB — COMPREHENSIVE METABOLIC PANEL
ALT: 25 IU/L (ref 0–32)
AST: 21 IU/L (ref 0–40)
Albumin/Globulin Ratio: 1.9 (ref 1.2–2.2)
Albumin: 4.4 g/dL (ref 3.8–4.9)
Alkaline Phosphatase: 88 IU/L (ref 44–121)
BUN/Creatinine Ratio: 21 (ref 9–23)
BUN: 17 mg/dL (ref 6–24)
Bilirubin Total: 0.7 mg/dL (ref 0.0–1.2)
CO2: 18 mmol/L — ABNORMAL LOW (ref 20–29)
Calcium: 9.6 mg/dL (ref 8.7–10.2)
Chloride: 103 mmol/L (ref 96–106)
Creatinine, Ser: 0.8 mg/dL (ref 0.57–1.00)
Globulin, Total: 2.3 g/dL (ref 1.5–4.5)
Glucose: 92 mg/dL (ref 70–99)
Potassium: 4.2 mmol/L (ref 3.5–5.2)
Sodium: 140 mmol/L (ref 134–144)
Total Protein: 6.7 g/dL (ref 6.0–8.5)
eGFR: 86 mL/min/{1.73_m2} (ref >59)

## 2022-07-09 LAB — LIPID PANEL W/O CHOL/HDL RATIO
Cholesterol, Total: 255 mg/dL — ABNORMAL HIGH (ref 100–199)
HDL: 79 mg/dL (ref >39)
LDL Chol Calc (NIH): 156 mg/dL — ABNORMAL HIGH (ref 0–99)
Triglycerides: 116 mg/dL (ref 0–149)
VLDL Cholesterol Cal: 20 mg/dL (ref 5–40)

## 2022-07-09 LAB — MAGNESIUM: Magnesium: 2.2 mg/dL (ref 1.6–2.3)

## 2022-07-09 LAB — TSH: TSH: 3.28 u[IU]/mL (ref 0.450–4.500)

## 2022-07-09 LAB — T4, FREE: Free T4: 1.84 ng/dL — ABNORMAL HIGH (ref 0.82–1.77)

## 2022-07-09 NOTE — Progress Notes (Signed)
Contacted via MyChart The 10-year ASCVD risk score (Arnett DK, et al., 2019) is: 3.1%   Values used to calculate the score:     Age: 57 years     Sex: Female     Is Non-Hispanic African American: No     Diabetic: No     Tobacco smoker: No     Systolic Blood Pressure: 137 mmHg     Is BP treated: Yes     HDL Cholesterol: 79 mg/dL     Total Cholesterol: 255 mg/dL   Good evening Tonya Odonnell, your labs have returned: - Kidney function, creatinine and eGFR, remains normal, as is liver function, AST and ALT.  - CBC shows baseline levels with no anemia or infection. - Thyroid labs show mild elevation in Free T4, but normal TSH -- continue current Levothyroxine dosing. - Cholesterol labs do show ongoing elevations, continue heavy focus on diet changes and regular activity. - Magnesium level normal.  Any questions? No changes to medication needed.   Keep being amazing!!  Thank you for allowing me to participate in your care.  I appreciate you. Kindest regards, Ashlin Hidalgo

## 2022-07-16 ENCOUNTER — Encounter: Payer: Self-pay | Admitting: Nurse Practitioner

## 2022-07-31 DIAGNOSIS — R92323 Mammographic fibroglandular density, bilateral breasts: Secondary | ICD-10-CM | POA: Diagnosis not present

## 2022-07-31 DIAGNOSIS — Z1231 Encounter for screening mammogram for malignant neoplasm of breast: Secondary | ICD-10-CM | POA: Diagnosis not present

## 2022-07-31 DIAGNOSIS — Z853 Personal history of malignant neoplasm of breast: Secondary | ICD-10-CM | POA: Diagnosis not present

## 2022-07-31 LAB — HM MAMMOGRAPHY

## 2022-08-02 IMAGING — US US THYROID
1 series · 14 of 25 positions shown · non-contrast
Comparison: None.

CLINICAL DATA: Hashimoto's thyroiditis

EXAM:
THYROID ULTRASOUND
TECHNIQUE: Ultrasound examination of the thyroid gland and adjacent soft
tissues was performed.

[Series 1: us thyroid · 0.07mm/px · 14 of 36 slices shown]
[im 1/36]
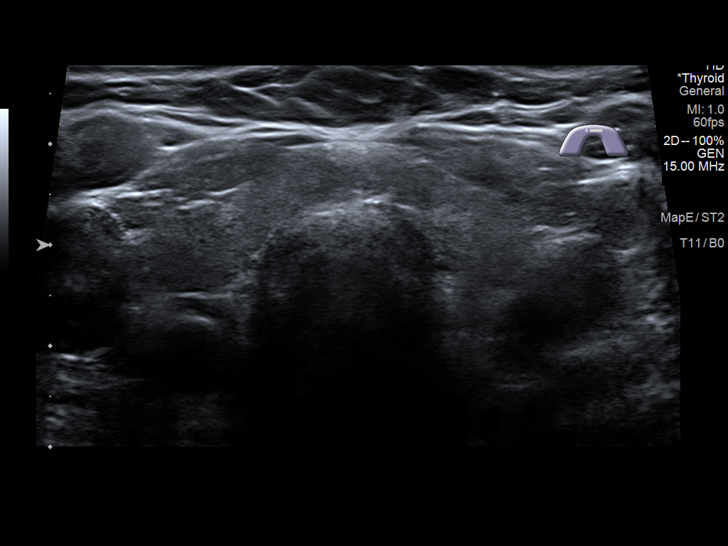
[im 3/36]
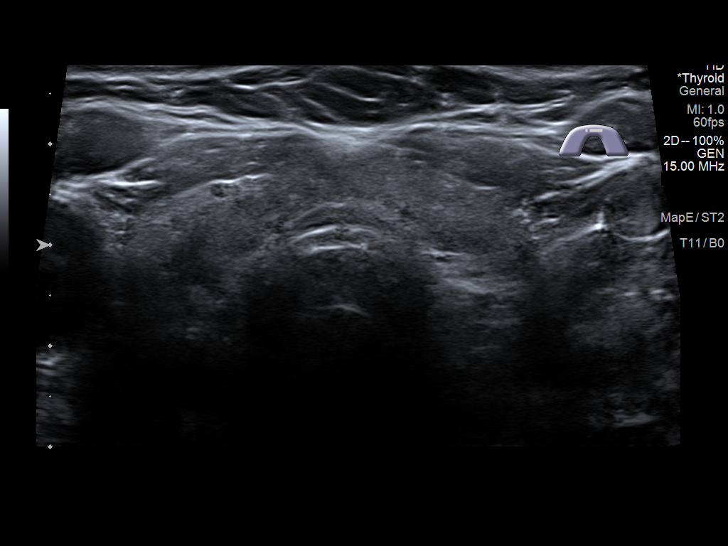
[im 6/36]
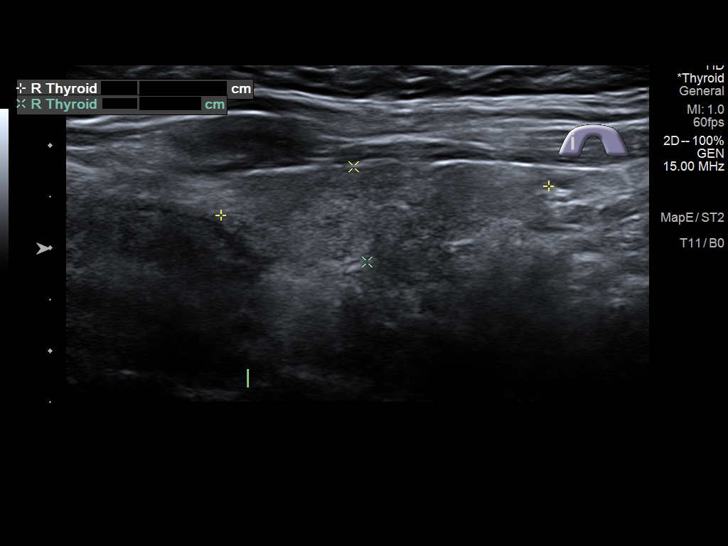
[im 9/36]
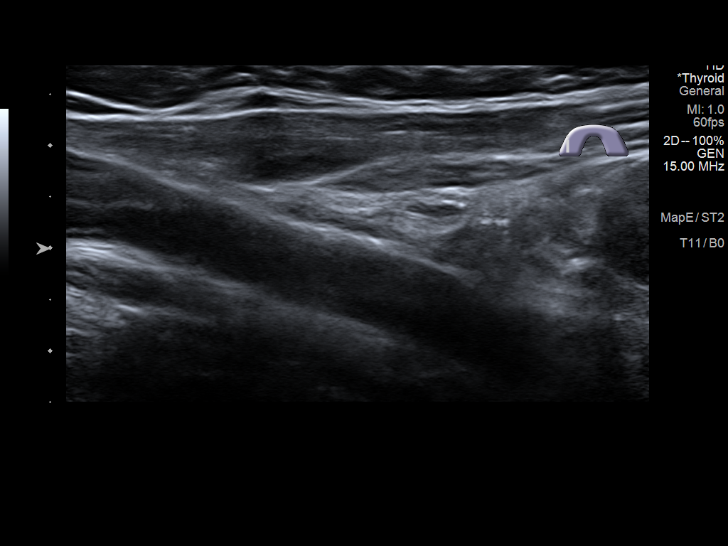
[im 12/36]
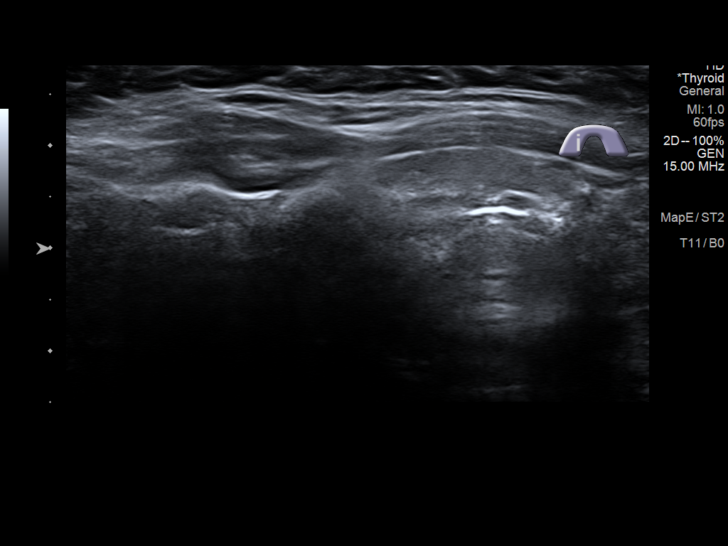
[im 14/36]
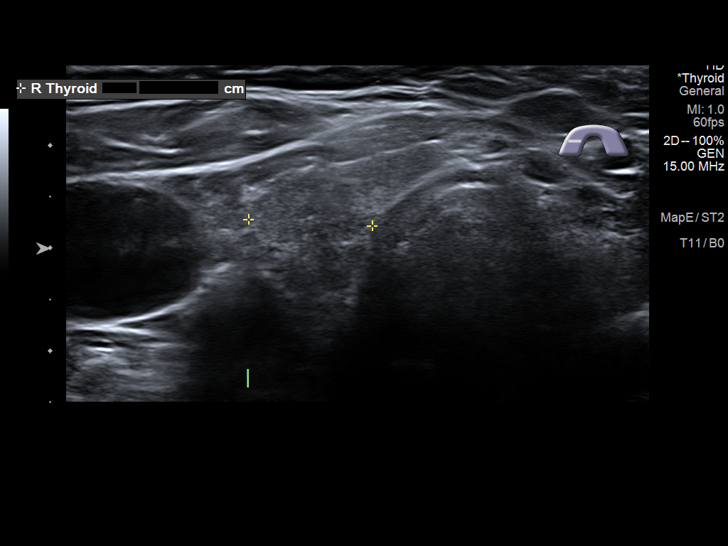
[im 17/36]
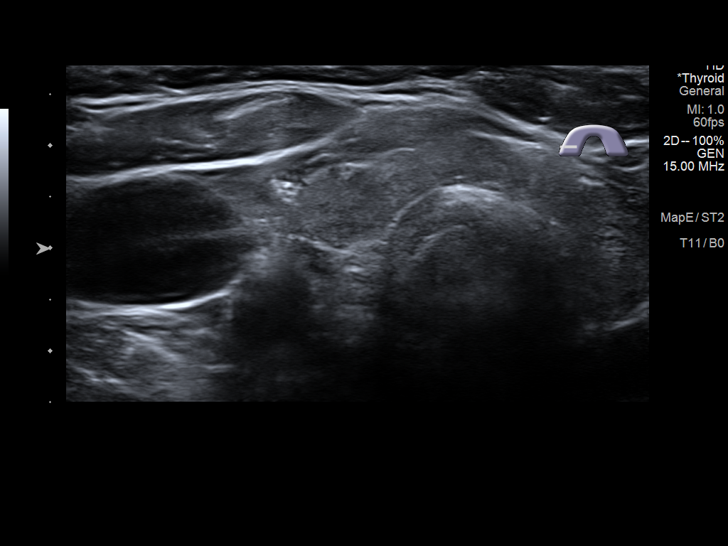
[im 19/36]
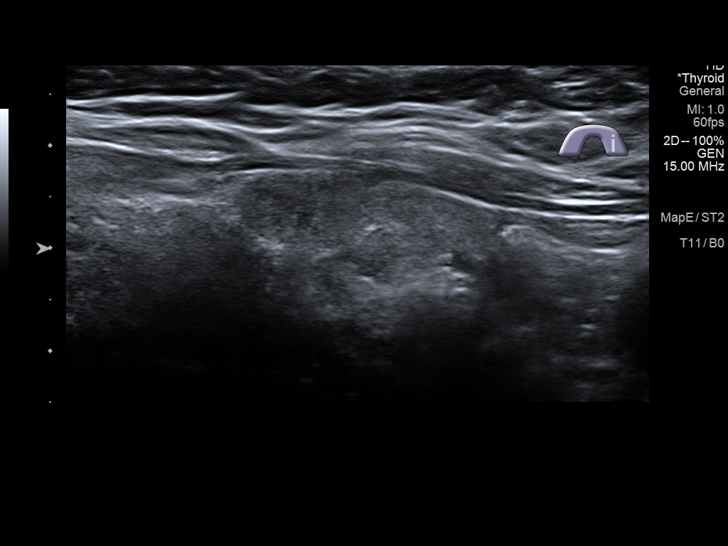
[im 22/36]
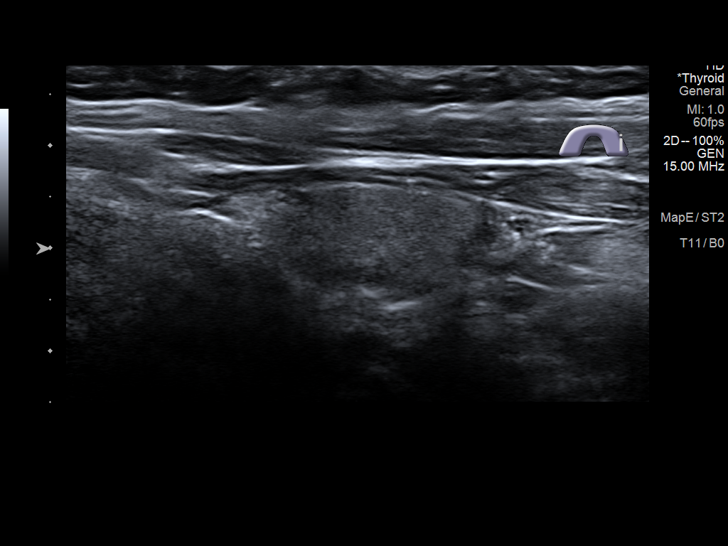
[im 24/36]
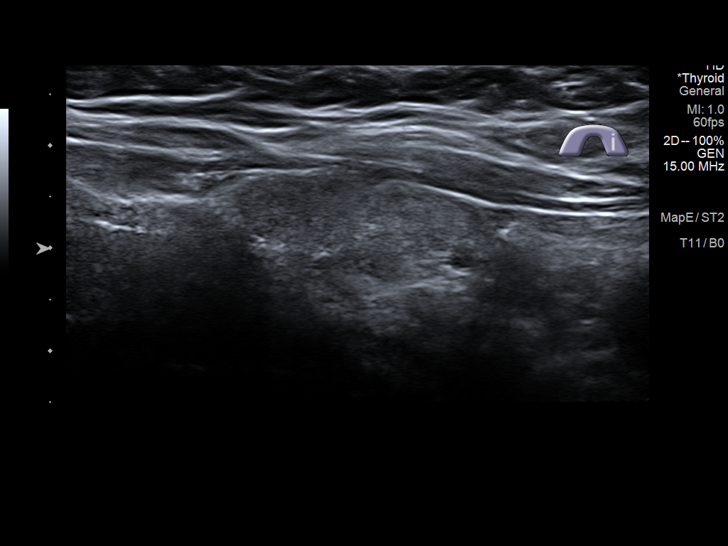
[im 27/36]
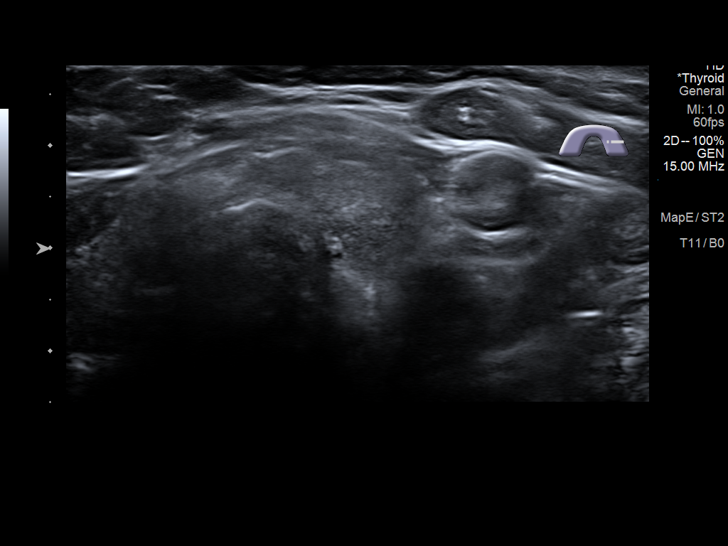
[im 30/36]
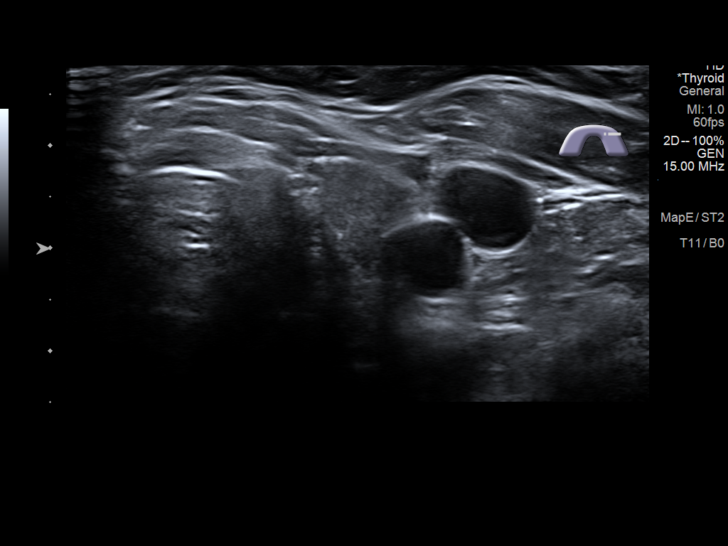
[im 33/36]
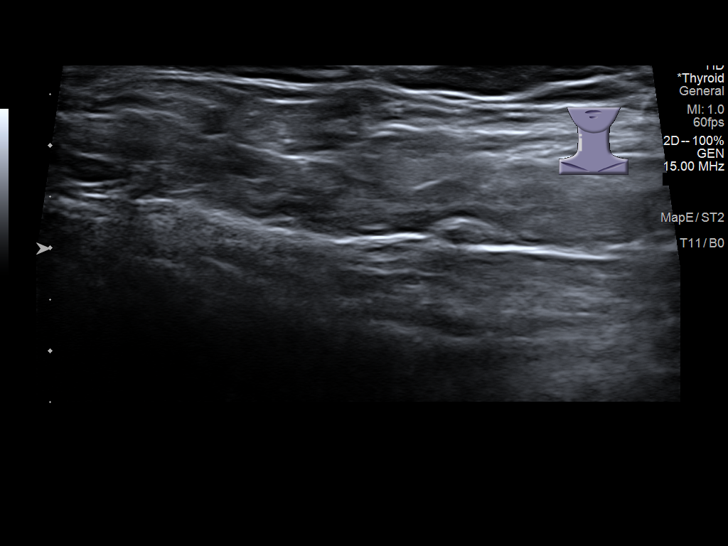
[im 36/36]
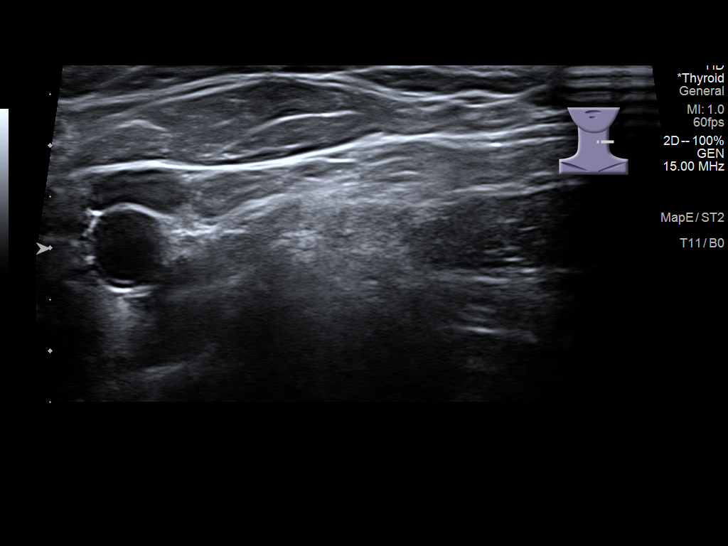

[14 of 25 positions shown; findings below may reference images not displayed]

FINDINGS: Parenchymal Echotexture: Moderately heterogenous

Isthmus: 5 mm

Right lobe: 3.2 x 0.9 x 1.2 cm

Left lobe: 2.7 x 0.9 x 1.0 cm

_________________________________________________________

Estimated total number of nodules >/= 1 cm: 0

Number of spongiform nodules >/=  2 cm not described below (TR1): 0

Number of mixed cystic and solid nodules >/= 1.5 cm not described
below (TR2): 0

_________________________________________________________

Moderate thyroid heterogeneity and atrophy noted compatible with
sequelae from prior thyroiditis or chronic medical thyroid disease.
Negative for nodule or focal abnormality. No hypervascularity. No
regional adenopathy.
IMPRESSION: Heterogeneous atrophic thyroid compatible with prior thyroiditis or
chronic medical thyroid disease.

Negative for thyroid nodule.

The above is in keeping with the ACR TI-RADS recommendations - [HOSPITAL] 5452;[DATE].

## 2022-08-09 DIAGNOSIS — Z419 Encounter for procedure for purposes other than remedying health state, unspecified: Secondary | ICD-10-CM | POA: Diagnosis not present

## 2022-08-13 ENCOUNTER — Telehealth: Payer: Self-pay | Admitting: Internal Medicine

## 2022-08-13 NOTE — Telephone Encounter (Signed)
Patient can feel a knot or lump at her rectum. She is concerned because of her cancer history. She agrees to come in to be seen. Appointment to be scheduled upon the release of the "7 day hold appointments" for 08/21/22 at 9:30 am with Willette Cluster, NP.

## 2022-08-13 NOTE — Telephone Encounter (Signed)
Patient is calling states she has a lump around he rectal area, and given she has a history of cancer she is very concerned and cannot wait until next available appt for Dr Leonides Schanz in September. Wishing to speak with the nurse. Please advise

## 2022-08-14 ENCOUNTER — Inpatient Hospital Stay (HOSPITAL_BASED_OUTPATIENT_CLINIC_OR_DEPARTMENT_OTHER): Payer: Medicaid Other | Admitting: Internal Medicine

## 2022-08-14 ENCOUNTER — Inpatient Hospital Stay: Payer: Medicaid Other | Attending: Internal Medicine

## 2022-08-14 ENCOUNTER — Encounter: Payer: Self-pay | Admitting: Internal Medicine

## 2022-08-14 VITALS — BP 143/100 | HR 76 | Temp 97.8°F | Wt 183.5 lb

## 2022-08-14 DIAGNOSIS — Z9221 Personal history of antineoplastic chemotherapy: Secondary | ICD-10-CM | POA: Diagnosis not present

## 2022-08-14 DIAGNOSIS — Z79899 Other long term (current) drug therapy: Secondary | ICD-10-CM | POA: Diagnosis not present

## 2022-08-14 DIAGNOSIS — Z9012 Acquired absence of left breast and nipple: Secondary | ICD-10-CM | POA: Insufficient documentation

## 2022-08-14 DIAGNOSIS — I10 Essential (primary) hypertension: Secondary | ICD-10-CM | POA: Insufficient documentation

## 2022-08-14 DIAGNOSIS — Z170 Estrogen receptor positive status [ER+]: Secondary | ICD-10-CM

## 2022-08-14 DIAGNOSIS — K6289 Other specified diseases of anus and rectum: Secondary | ICD-10-CM | POA: Insufficient documentation

## 2022-08-14 DIAGNOSIS — Z853 Personal history of malignant neoplasm of breast: Secondary | ICD-10-CM | POA: Insufficient documentation

## 2022-08-14 DIAGNOSIS — K649 Unspecified hemorrhoids: Secondary | ICD-10-CM | POA: Insufficient documentation

## 2022-08-14 DIAGNOSIS — Z79624 Long term (current) use of inhibitors of nucleotide synthesis: Secondary | ICD-10-CM | POA: Diagnosis not present

## 2022-08-14 DIAGNOSIS — C50412 Malignant neoplasm of upper-outer quadrant of left female breast: Secondary | ICD-10-CM

## 2022-08-14 DIAGNOSIS — G894 Chronic pain syndrome: Secondary | ICD-10-CM | POA: Diagnosis not present

## 2022-08-14 DIAGNOSIS — M797 Fibromyalgia: Secondary | ICD-10-CM | POA: Diagnosis not present

## 2022-08-14 DIAGNOSIS — M8580 Other specified disorders of bone density and structure, unspecified site: Secondary | ICD-10-CM | POA: Diagnosis not present

## 2022-08-14 LAB — CBC WITH DIFFERENTIAL/PLATELET
Abs Immature Granulocytes: 0.04 10*3/uL (ref 0.00–0.07)
Basophils Absolute: 0.1 10*3/uL (ref 0.0–0.1)
Basophils Relative: 1 %
Eosinophils Absolute: 0.1 10*3/uL (ref 0.0–0.5)
Eosinophils Relative: 2 %
HCT: 47.6 % — ABNORMAL HIGH (ref 36.0–46.0)
Hemoglobin: 16 g/dL — ABNORMAL HIGH (ref 12.0–15.0)
Immature Granulocytes: 1 %
Lymphocytes Relative: 43 %
Lymphs Abs: 2.9 10*3/uL (ref 0.7–4.0)
MCH: 29 pg (ref 26.0–34.0)
MCHC: 33.6 g/dL (ref 30.0–36.0)
MCV: 86.2 fL (ref 80.0–100.0)
Monocytes Absolute: 0.4 10*3/uL (ref 0.1–1.0)
Monocytes Relative: 7 %
Neutro Abs: 3.2 10*3/uL (ref 1.7–7.7)
Neutrophils Relative %: 46 %
Platelets: 323 10*3/uL (ref 150–400)
RBC: 5.52 MIL/uL — ABNORMAL HIGH (ref 3.87–5.11)
RDW: 13.2 % (ref 11.5–15.5)
WBC: 6.8 10*3/uL (ref 4.0–10.5)
nRBC: 0 % (ref 0.0–0.2)

## 2022-08-14 LAB — BASIC METABOLIC PANEL
Anion gap: 8 (ref 5–15)
BUN: 19 mg/dL (ref 6–20)
CO2: 22 mmol/L (ref 22–32)
Calcium: 9 mg/dL (ref 8.9–10.3)
Chloride: 109 mmol/L (ref 98–111)
Creatinine, Ser: 0.68 mg/dL (ref 0.44–1.00)
GFR, Estimated: >60 mL/min (ref >60)
Glucose, Bld: 97 mg/dL (ref 70–99)
Potassium: 4 mmol/L (ref 3.5–5.1)
Sodium: 139 mmol/L (ref 135–145)

## 2022-08-14 MED ORDER — HYDROCORTISONE (PERIANAL) 2.5 % EX CREA: 1.0000 | TOPICAL_CREAM | Freq: Two times a day (BID) | CUTANEOUS | 0 refills | Status: AC

## 2022-08-14 NOTE — Telephone Encounter (Signed)
The pt has been scheduled to see Willette Cluster NP on 08/21/22 at 10 am.  Left message on machine to call back

## 2022-08-14 NOTE — Telephone Encounter (Signed)
The pt returned call and has been given the appt date and time with [facility].

## 2022-08-14 NOTE — Progress Notes (Signed)
Patient thinks that she has a Hemorid, which is very painful, and causes her to have goose bumps. So she wants to see if you can look at it, or what you would recommended.

## 2022-08-14 NOTE — Progress Notes (Signed)
[facility] CONSULT NOTE  Patient Care Team: Marjie Skiff, NP as PCP - General (Nurse Practitioner)  REFERRING PROVIDER: Aura Dials, NP  REASON FOR REFFERAL: history of breast cancer  CANCER STAGING   Cancer Staging  No matching staging information was found for the patient.  ASSESSMENT & PLAN:  Tonya Odonnell 57 y.o. 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  She discontinued endocrine therapy in December 2022. Detailed oncology history as below.  # History of left breast IDC multifocal ER/PR positive, HER2 negative -Treatment course is detailed as below. -She had mammogram in May 2024 at [facility].  No suspicious lesions.  She is scheduled for annual mammogram next year.  Labs were reviewed and unremarkable.  Patient prefers to continue to follow with medical oncology.  So I will follow-up with her in 1 year after mammogram.  # Fibromyalgia # Chronic pain syndrome -Was seen by Dr. Glenna Fellows rheumatology at [facility] for evaluation.  Agreed with her prior diagnosis of fibromyalgia.  Did not think she has other rheumatologic condition contributing to her diffuse pain. -Recently was treated with a course of prednisone which has helped her.  # Anal pain -Started few days ago.  Last colonoscopy done in June 2023 showed multiple polyps in transverse colon, internal hemorrhoids.  Repeat was recommended in 5 years. -On my exam, hemorrhoids present.  Will do a trial of Anusol cream rectally twice daily.  She is scheduled to follow-up with gastro next week for rectal exam.  #  Orders Placed This Encounter  Procedures   CBC with Differential (Cancer Center Only)    Standing Status:   Future    Standing Expiration Date:   08/14/2023   CMP (Cancer Center only)    Standing Status:   Future    Standing Expiration Date:    08/14/2023   RTC in 1 year for MD visit, labs.  Mammogram prior  The total time spent in the appointment was 30 minutes encounter with patients including review of chart and various tests results, discussions about plan of care and coordination of care plan   All questions were answered. The patient knows to call the clinic with any problems, questions or concerns. No barriers to learning was detected.  Michaelyn Barter, MD 6/6/20244:04 PM   HISTORY OF PRESENTING ILLNESS:  Tonya Odonnell 57 y.o. female with pmh of left breast IDC in 2016.  Oncology history as below.   Patient has been following with Lillard Anes, NP from medical oncology at [facility].  She was having pain in her right shoulder and abdomen.  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  Of note, she also had mammogram done in May 2023 which was normal.  She wanted to transfer her care to [facility]  Interval history Patient was seen today as a follow-up to discuss mammogram results.  She was seen by rheumatology at [facility] and was told that her pain is consistent with fibromyalgia.  She likely does not have any other rheumatologic condition.  Recently was treated with course of prednisone had has felt better.  She thinks that she has a lot of fluid in her body since chemotherapy.  Was started on low-dose of HCTZ.  Since start of the medication, patient is intermittently feeling as if the implant on the left side  is rubbing against her chest.  She does not have that sensation when she does not take the medication.  She is worried if there is something wrong with her implant.  We discussed that it is less likely since it is only happening when she takes the medication.  She also noticed palpable round area in her anal region associated with pain few days ago.  No bleeding in urine or stool.  Anxious it may be cancer.  Was not able to sleep last night.  Has follow-up scheduled with GI  next week.  Does have history of hemorrhoids on colonoscopy.  Denies any constipation.  I have reviewed her chart and materials related to her cancer extensively and collaborated history with the patient. Summary of oncologic history is as follows: Oncology History  History of breast cancer  08/2014 Initial Diagnosis   After many years of complains of swelling in the left side of the body and tenderness in her breast, she was diagnosed with grade 2 IDC 2.1 cm left breast 3 o'clock position   12/2014 Surgery   Left lumpectomy: Grade 2 multifocal invasive ductal carcinoma 1.6 cm, 0.3 cm, DCIS low-grade, lymphovascular invasion present, 1/2 lymph nodes positive ER positive greater than 95%, PR positive greater than 95%, HER2 equivocal 2+ by IHC and FISH was negative (Dr.Lo)   01/17/2015 Surgery   Left mastectomy (done because of close margins): Residual LCIS, 1/5 lymph nodes positive (total 2/10 lymph nodes positive)   01/17/2015 Oncotype testing   Oncotype DX score: 20   2016 - 2017 Chemotherapy   Adjuvant chemotherapy (patient is not sure of what drugs she received but that she had 8 doses)   07/28/2015 -  Anti-estrogen oral therapy   Patient was on and off antiestrogen therapy since May 2017.  Unable to tolerate tamoxifen or Zoladex.  Eventually after she became menopausal, started on Arimidex January 2021 (takes Cymbalta for hot flashes). Reports discontinued in Dec 2022 as advised by Oncologist in [place].      MEDICAL HISTORY:  Past Medical History:  Diagnosis Date   Anemia    "USE TO HAVE IT,NOT NOW,2015,2016   Arthritis    HIPS,KNEES,HANDS   Breast cancer in female (HCC)    Cancer (HCC)    LEFT   Hyperlipidemia    Hypertension    Neuromuscular disorder (HCC)    MUSCLE PAIN,FIBROMYALGIA   Osteopenia    PVC (premature ventricular contraction)    Thyroid disease     SURGICAL HISTORY: Past Surgical History:  Procedure Laterality Date   Left Ovarian removal Left     MASTECTOMY Left    "LYMPHNODES REMOVED    SOCIAL HISTORY: Social History   Socioeconomic History   Marital status: Married    Spouse name: Not on file   Number of children: Not on file   Years of education: Not on file   Highest education level: Not on file  Occupational History   Occupation: disabled  Tobacco Use   Smoking status: Never   Smokeless tobacco: Never  Vaping Use   Vaping Use: Never used  Substance and Sexual Activity   Alcohol use: Never   Drug use: Never   Sexual activity: Not Currently  Other Topics Concern   Not on file  Social History Narrative   Not on file   Social Determinants of Health   Financial Resource Strain: Not on file  Food Insecurity: Not on file  Transportation Needs: Not on file  Physical Activity: Not on file  Stress: Not on file  Social Connections: Not on file  Intimate Partner Violence: Not on file    FAMILY HISTORY: Family History  Problem Relation Age of Onset   Hypertension Mother    Leukemia Mother    Stomach cancer Father    Colon cancer Neg Hx    Colon polyps Neg Hx    Crohn's disease Neg Hx    Esophageal cancer Neg Hx    Rectal cancer Neg Hx     ALLERGIES:  is allergic to lisinopril.  MEDICATIONS:  Current Outpatient Medications  Medication Sig Dispense Refill   Cholecalciferol 25 MCG (1000 UT) tablet Take 1 tablet (1,000 Units total) by mouth daily. 90 tablet 4   clobetasol cream (TEMOVATE) 0.05 % Apply 1 application topically 2 (two) times daily. Apply to affected area 30 g 2   cromolyn (OPTICROM) 4 % ophthalmic solution Place 1 drop into both eyes 4 (four) times daily. 10 mL 4   diclofenac Sodium (VOLTAREN) 1 % GEL Apply topically as needed.     ELIDEL 1 % cream Apply to red scaly areas on the face BID for seb derm 30 g 4   hydrochlorothiazide (HYDRODIURIL) 12.5 MG tablet Take 12.5 mg by mouth daily.     hydrocortisone (ANUSOL-HC) 2.5 % rectal cream Place 1 Application rectally 2 (two) times daily for 7  days. 21 g 0   ibuprofen (ADVIL) 200 MG tablet Take 200 mg by mouth every 6 (six) hours as needed.     Ivermectin (SOOLANTRA) 1 % CREA Apply 1 Application topically at bedtime. 45 g 2   levothyroxine (SYNTHROID) 125 MCG tablet Take 1 tablet (125 mcg total) by mouth daily. 90 tablet 3   metoprolol succinate (TOPROL-XL) 50 MG 24 hr tablet Take 1 tablet (50 mg total) by mouth in the morning and at bedtime. 180 tablet 4   olopatadine (PATANOL) 0.1 % ophthalmic solution 1 drop 2 (two) times daily.     omeprazole (PRILOSEC) 40 MG capsule Take 1 capsule (40 mg total) by mouth daily. 30 capsule 12   valACYclovir (VALTREX) 1000 MG tablet Take 1,000 mg by mouth daily.     cetirizine-pseudoephedrine (ZYRTEC-D) 5-120 MG tablet Take 1 tablet by mouth 2 (two) times daily. (Patient not taking: Reported on 08/14/2022) 60 tablet 4   fluticasone (FLONASE) 50 MCG/ACT nasal spray Place 2 sprays into both nostrils daily. (Patient taking differently: Place 2 sprays into both nostrils as needed.) 16 g 2   minoxidil (LONITEN) 2.5 MG tablet Take 1 tablet (2.5 mg total) by mouth daily. (Patient not taking: Reported on 08/14/2022) 90 tablet 1   No current facility-administered medications for this visit.    REVIEW OF SYSTEMS:   Pertinent information mentioned in HPI All other systems were reviewed with the patient and are negative.  PHYSICAL EXAMINATION: ECOG PERFORMANCE STATUS: 2 - Symptomatic, <50% confined to bed  Vitals:   08/14/22 1400 08/14/22 1438  BP: (!) 151/107 (!) 143/100  Pulse: 76   Temp: 97.8 F (36.6 C)   SpO2: 97%    Filed Weights   08/14/22 1400  Weight: 183 lb 8 oz (83.2 kg)    GENERAL:alert, no distress and comfortable SKIN: skin color, texture, turgor are normal, no rashes or significant lesions EYES: normal, conjunctiva are pink and non-injected, sclera clear OROPHARYNX:no exudate, no erythema and lips, buccal mucosa, and tongue normal  NECK: supple, thyroid normal size, non-tender,  without nodularity LYMPH:  no palpable lymphadenopathy in the cervical, axillary or inguinal  LUNGS: clear to auscultation and percussion with normal breathing effort HEART: regular rate & rhythm and no murmurs and no lower extremity edema ABDOMEN:abdomen soft, non-tender and normal bowel sounds Musculoskeletal:no cyanosis of digits and no clubbing  PSYCH: alert & oriented x 3 with fluent speech NEURO: no focal motor/sensory deficits  LABORATORY DATA:  I have reviewed the data as listed Lab Results  Component Value Date   WBC 6.8 08/14/2022   HGB 16.0 (H) 08/14/2022   HCT 47.6 (H) 08/14/2022   MCV 86.2 08/14/2022   PLT 323 08/14/2022   Recent Labs    08/21/21 0906 08/21/21 0906 11/20/21 1451 12/17/21 1048 01/16/22 1132 07/08/22 1106 08/14/22 1324  NA 140   < > 143  --  139 140 139  K 3.7  --  4.4  --  4.5 4.2 4.0  CL 107  --  104  --  105 103 109  CO2 27  --  21  --  20 18* 22  GLUCOSE 101*   < > 110*  --  92 92 97  BUN 19   < > 18  --  15 17 19   CREATININE 0.75   < > 0.88  --  0.69 0.80 0.68  CALCIUM 9.6  --  10.0   < > 9.3 9.6 9.0  GFRNONAA >60  --   --   --   --   --  >60  PROT 7.5  --  7.3  --  7.2 6.7  --   ALBUMIN 4.4  --  4.7  --  4.7 4.4  --   AST 20  --  27  --  18 21  --   ALT 24  --  27  --  19 25  --   ALKPHOS 79  --  92  --  88 88  --   BILITOT 1.2  --  1.0  --  1.3* 0.7  --    < > = values in this interval not displayed.    RADIOGRAPHIC STUDIES: I have personally reviewed the radiological images as listed and agreed with the findings in the report. No results found.

## 2022-08-15 ENCOUNTER — Ambulatory Visit: Payer: Medicaid Other | Admitting: Internal Medicine

## 2022-08-21 ENCOUNTER — Ambulatory Visit (INDEPENDENT_AMBULATORY_CARE_PROVIDER_SITE_OTHER): Payer: Medicaid Other | Admitting: Nurse Practitioner

## 2022-08-21 ENCOUNTER — Encounter: Payer: Self-pay | Admitting: Nurse Practitioner

## 2022-08-21 VITALS — BP 142/82 | HR 78 | Ht 62.0 in | Wt 185.0 lb

## 2022-08-21 DIAGNOSIS — K602 Anal fissure, unspecified: Secondary | ICD-10-CM

## 2022-08-21 DIAGNOSIS — K645 Perianal venous thrombosis: Secondary | ICD-10-CM | POA: Diagnosis not present

## 2022-08-21 MED ORDER — DILTIAZEM GEL 2 %: 1.0000 | Freq: Three times a day (TID) | CUTANEOUS | 0 refills | Status: DC

## 2022-08-21 NOTE — Progress Notes (Signed)
Assessment and Plan   Primary GI: Imogene Burn, MD  Brief Narrative:  57 y.o. yo female whose past medical history includes but is not necessarily limited to breast cancer, fibromyalgia, hypothyroidism, adenomatous colon polyps, diverticulosis, hypertension  Anorectal discomfort. Came in with a perianal "lump" and terrified it is cancer. She has a small thrombosed external hemorrhoid but suspect posterior midline fissure based on the discomfort she has been having at home and on DRE.  -Diltiazem ointment TID x 8 weeks as directed -Daily Metamucil  -Sitz baths -Follow up with me in 8 weeks. -She has been using steroid cream on the external hemorrhoid.  It is small enough now that she can hold off on further treatment  History of tubular adenomatous colon polyps June 2023 -5-year interval colonoscopy recommended    History of Present Illness  Chief complaint: Lump around rectum  Several days ago patient noticed a painful perianal lump.  She was prescribed hydrocortisone cream which she has been applying to the area for about a week.  The pain has improved.  She has not had any rectal bleeding. She is worried about cancer.  She inquires about the precancerous polyps that were removed from her colon last year.   Alexis tells me her bowel movements are usually normal.  Very occasionally she may strain but otherwise no constipation.  She had some problems with the swine flu vaccine several years ago.  It is left her with a lot of pain in her left hip, buttock and left leg   Previous GI Endoscopies / Labs / Imaging  June 2023 screening colonoscopy  - The examined portion of the ileum was normal. - One 15 mm polyp in the transverse colon, removed with a hot snare. Resected and retrieved. Tattooed. - One 8 mm polyp in the transverse colon, removed with a hot snare. Resected and retrieved. - One 4 mm polyp in the transverse colon, removed with a cold snare. Resected and retrieved. -  Diverticulosis in the sigmoid colon. - Non-bleeding internal hemorrhoids. Surgical [P], colon, transverse x2 HAIR SHAFT, transverse x1 CS, polyp (3) FINDINGS CONSISTENT WITH TUBULAR ADENOMA AND INFLAMMATORY POLYP (LARGER ONE). NO HIGH-GRADE DYSPLASIA OR MALIGNANCY IS SEEN. CLINICAL AND ENDOSCOPIC CORRELATION IS REQUIRED  5 year follow up     Latest Ref Rng & Units 07/08/2022   11:06 AM 01/16/2022   11:32 AM 11/20/2021    2:51 PM  Hepatic Function  Total Protein 6.0 - 8.5 g/dL 6.7  7.2  7.3   Albumin 3.8 - 4.9 g/dL 4.4  4.7  4.7   AST 0 - 40 IU/L 21  18  27    ALT 0 - 32 IU/L 25  19  27    Alk Phosphatase 44 - 121 IU/L 88  88  92   Total Bilirubin 0.0 - 1.2 mg/dL 0.7  1.3  1.0        Latest Ref Rng & Units 08/14/2022    1:24 PM 07/08/2022   11:06 AM 01/16/2022   11:32 AM  CBC  WBC 4.0 - 10.5 K/uL 6.8  7.9  7.9   Hemoglobin 12.0 - 15.0 g/dL 04.5  40.9  81.1   Hematocrit 36.0 - 46.0 % 47.6  48.3  47.8   Platelets 150 - 400 K/uL 323  328  363      Past Medical History:  Diagnosis Date   Anemia    "USE TO HAVE IT,NOT NOW,2015,2016   Arthritis    HIPS,KNEES,HANDS  Breast cancer in female [facility])    Cancer (HCC)    LEFT   Hyperlipidemia    Hypertension    Neuromuscular disorder (HCC)    MUSCLE PAIN,FIBROMYALGIA   Osteopenia    PVC (premature ventricular contraction)    Thyroid disease     Past Surgical History:  Procedure Laterality Date   Left Ovarian removal Left    MASTECTOMY Left    "LYMPHNODES REMOVED    Current Medications, Allergies, Family History and Social History were reviewed in [employer] medical record.     Current Outpatient Medications  Medication Sig Dispense Refill   cetirizine-pseudoephedrine (ZYRTEC-D) 5-120 MG tablet Take 1 tablet by mouth 2 (two) times daily. (Patient not taking: Reported on 08/14/2022) 60 tablet 4   Cholecalciferol 25 MCG (1000 UT) tablet Take 1 tablet (1,000 Units total) by mouth daily. 90 tablet 4   clobetasol  cream (TEMOVATE) 0.05 % Apply 1 application topically 2 (two) times daily. Apply to affected area 30 g 2   cromolyn (OPTICROM) 4 % ophthalmic solution Place 1 drop into both eyes 4 (four) times daily. 10 mL 4   diclofenac Sodium (VOLTAREN) 1 % GEL Apply topically as needed.     ELIDEL 1 % cream Apply to red scaly areas on the face BID for seb derm 30 g 4   fluticasone (FLONASE) 50 MCG/ACT nasal spray Place 2 sprays into both nostrils daily. (Patient taking differently: Place 2 sprays into both nostrils as needed.) 16 g 2   hydrochlorothiazide (HYDRODIURIL) 12.5 MG tablet Take 12.5 mg by mouth daily.     hydrocortisone (ANUSOL-HC) 2.5 % rectal cream Place 1 Application rectally 2 (two) times daily for 7 days. 21 g 0   ibuprofen (ADVIL) 200 MG tablet Take 200 mg by mouth every 6 (six) hours as needed.     Ivermectin (SOOLANTRA) 1 % CREA Apply 1 Application topically at bedtime. 45 g 2   levothyroxine (SYNTHROID) 125 MCG tablet Take 1 tablet (125 mcg total) by mouth daily. 90 tablet 3   metoprolol succinate (TOPROL-XL) 50 MG 24 hr tablet Take 1 tablet (50 mg total) by mouth in the morning and at bedtime. 180 tablet 4   minoxidil (LONITEN) 2.5 MG tablet Take 1 tablet (2.5 mg total) by mouth daily. (Patient not taking: Reported on 08/14/2022) 90 tablet 1   olopatadine (PATANOL) 0.1 % ophthalmic solution 1 drop 2 (two) times daily.     omeprazole (PRILOSEC) 40 MG capsule Take 1 capsule (40 mg total) by mouth daily. 30 capsule 12   valACYclovir (VALTREX) 1000 MG tablet Take 1,000 mg by mouth daily.     No current facility-administered medications for this visit.    Review of Systems: No chest pain. No shortness of breath. No urinary complaints.    Physical Exam  Wt Readings from Last 3 Encounters:  08/14/22 183 lb 8 oz (83.2 kg)  07/08/22 192 lb (87.1 kg)  04/15/22 192 lb 3.2 oz (87.2 kg)    LMP  (LMP Unknown)  BP 142/82, heart rate 78 Constitutional:  Pleasant, generally well appearing  female in no acute distress. Psychiatric: Normal mood and affect. Behavior is normal. EENT: Pupils normal.  Conjunctivae are normal. No scleral icterus. Neck supple.  Cardiovascular: Normal rate, regular rhythm.  Pulmonary/chest: Effort normal and breath sounds normal. No wheezing, rales or rhonchi. Abdominal: Soft, nondistended, nontender. Bowel sounds active throughout. There are no masses palpable. No hepatomegaly. Rectal: Tiny thrombosed external hemorrhoid near the posterior midline.  On DRE she has posterior midline discomfort Neurological: Alert and oriented to person place and time.   Skin: Skin is warm and dry. No rashes noted.  Willette Cluster, NP  08/21/2022, 8:17 AM

## 2022-08-21 NOTE — Patient Instructions (Addendum)
We have sent a prescription for Diltiazem 2% gel to [facility] for you.  [facility]'s information is below: Address: [address] [place], [place] [address]  [phone]  *Please DO NOT go directly from our office to pick up this medication! Give the pharmacy 1 day to process the prescription as this is compounded and takes time to make.   You have been scheduled for an appointment with Willette Cluster on 10/27/22 at 200 PM. Please arrive 10 minutes early for your appointment.    .Treatment of anal fissures  Apply a pea size amount of Diltiazem ointment to fingertip Gently insert finger tip about 1/2 inch into anus.  Move finger in a circular fashion inside anal canal to spread the ointment around.  You may use a small amount of Vaseline to help coat your finger if desired.  Apply this ointment three times daily for 6 weeks.  Warm tub soaks for 10-15 minutes 2-3 times daily. Make sure you dry area afterwards Fiber can help stools pass more easily. I recommend starting a daily fiber supplement such as Metamucil, Benefiber or Citrucel You can use over the counter glycerin suppositories as needed to avoid straining with bowel movements Start daily Metamucil  _______________________________________________________  If your blood pressure at your visit was 140/90 or greater, please contact your primary care physician to follow up on this.  _______________________________________________________  If you are age 57 or younger, your body mass index should be between 19-25. Your Body mass index is 33.84 kg/m. If this is out of the aformentioned range listed, please consider follow up with your Primary Care Provider.   __________________________________________________________  The [facility] providers would like to encourage you to use [facility] to communicate with providers for non-urgent requests or questions.  Due to long hold times on the telephone, sending your  provider a message by [facility] may be a faster and more efficient way to get a response.  Please allow 48 business hours for a response.  Please remember that this is for non-urgent requests.   Thank you for choosing me and [facility].  Willette Cluster, NP.

## 2022-08-22 NOTE — Progress Notes (Signed)
I agree with the assessment and plan as outlined by Ms. Guenther. 

## 2022-08-25 ENCOUNTER — Telehealth: Payer: Self-pay | Admitting: *Deleted

## 2022-08-25 NOTE — Telephone Encounter (Addendum)
Patient called reporting that her health is getting worse and is asking if there is any other test that Dr A can do on her. She iis requesting to be seen. Please advise  Of note, I see that she was diagnosed with an anal fissure on the 13th

## 2022-08-26 ENCOUNTER — Encounter: Payer: Self-pay | Admitting: Dermatology

## 2022-08-26 ENCOUNTER — Ambulatory Visit: Payer: Medicaid Other | Admitting: Dermatology

## 2022-08-26 ENCOUNTER — Encounter: Payer: Self-pay | Admitting: Nurse Practitioner

## 2022-08-26 VITALS — BP 162/98 | HR 72

## 2022-08-26 DIAGNOSIS — L650 Telogen effluvium: Secondary | ICD-10-CM

## 2022-08-26 DIAGNOSIS — L659 Nonscarring hair loss, unspecified: Secondary | ICD-10-CM | POA: Diagnosis not present

## 2022-08-26 DIAGNOSIS — W57XXXA Bitten or stung by nonvenomous insect and other nonvenomous arthropods, initial encounter: Secondary | ICD-10-CM | POA: Diagnosis not present

## 2022-08-26 DIAGNOSIS — L219 Seborrheic dermatitis, unspecified: Secondary | ICD-10-CM | POA: Diagnosis not present

## 2022-08-26 DIAGNOSIS — L719 Rosacea, unspecified: Secondary | ICD-10-CM

## 2022-08-26 DIAGNOSIS — S90861A Insect bite (nonvenomous), right foot, initial encounter: Secondary | ICD-10-CM

## 2022-08-26 MED ORDER — AZELAIC ACID 15 % EX GEL: CUTANEOUS | 3 refills | Status: DC

## 2022-08-26 NOTE — Patient Instructions (Addendum)
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  Continue Ivermectin cream at bedtime Wear sunscreen daily.   Counseling for BBL / IPL / Laser and Coordination of Care Discussed the treatment option of Broad Band Light (BBL) /Intense Pulsed Light (IPL)/ Laser for skin discoloration, including brown spots and redness.  Typically we recommend at least 1-3 treatment sessions about 5-8 weeks apart for best results.  Cannot have tanned skin when BBL performed, and regular use of sunscreen is advised after the procedure to help maintain results. The patient's condition may also require "maintenance treatments" in the future.  The fee for BBL / laser treatments is $350 per treatment session for the whole face.  A fee can be quoted for other parts of the body.  Insurance typically does not pay for BBL/laser treatments and therefore the fee is an out-of-pocket cost.   Seborrheic dermatitis on face and ears: Continue Elidel 1 % cream apply to red scaly areas on face once or twice daily as needed   Continue Derm-otic 0.01% oil - apply topically to affected areas of ears once or twice daily as needed for flares of seb derm

## 2022-08-26 NOTE — Telephone Encounter (Addendum)
Call returned to patient . She complains of jont pain and left side pain. and states that GI said she had a hemorrhoid and are treating that with medicine and will see her back in 6 weeks to discuss the precancerous polyp removed last year. Also has nausea. I explained to her as per Dr Alena Bills that she should discuss her symptoms with her PCP and she got upset and said that she could not order a bone scan for her  and that she does not understand the health system in [place] as she came from [place] and she had a hard time to even find an oncologist . I again told her verbatim what Dr a wrote in her note about her cancer wu being negative and she said "you can, you don't know what you are talking about" I explained to her that there are test that show cancer but I had not looked to see what labs she specifically had drawn. She got mad and said what you are saying is that she does not want to see me. I said that is not what I said, I am going to send a note She then said you know what, I am not going to see that doctor any more and hung up on me

## 2022-08-26 NOTE — Progress Notes (Signed)
Follow-Up Visit   Subjective  Tonya Odonnell is a 57 y.o. female who presents for the following: 6 month alopecia, seb derm and rosacea follow up. Started Minoxidil 03/06/2021, stopped in June 2023 for few weeks then restarted in June 2023.  Stopped taking Minoxidil 2.5 mg tablet in May. States was not helping, was continuing to lose hair. States had some abnormal lab values as well. C/O dryness of hair and face.  Still using Ivermectin cream on face for rosacea. States is not helping. Helps when applies but when washes off the next day skin is still the same. Has used Metronidazole cream in the past Reports thyroid levels and glucose levels have been abnormal. Using Elidel cream and Fluocinolone oil as directed, as needed.   The following portions of the chart were reviewed this encounter and updated as appropriate: medications, allergies, medical history  Review of Systems:  No other skin or systemic complaints except as noted in HPI or Assessment and Plan.  Objective  Well appearing patient in no apparent distress; mood and affect are within normal limits.  A focused examination was performed of the following areas: Face, scalp  Relevant exam findings are noted in the Assessment and Plan.  Right lateral Foot - Anterior Erythematous edematous plaque    Assessment & Plan   Alopecia.  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 Has had some abnormal thyroid labs, and currently adjusting medication.  Pt has stopped oral minoxidil since not working.  Exam: Diffuse thinning of the crown and widening of the midline part with retention of the frontal hairline  Chronic and persistent condition with duration or expected duration over one year. Condition is symptomatic/ bothersome to patient. Not currently at goal.   Female Androgenic Alopecia is a chronic condition related  to genetics and/or hormonal changes.  In women androgenetic alopecia is commonly associated with menopause but may occur any time after puberty.  It causes hair thinning primarily on the crown with widening of the part and temporal hairline recession.  Can use OTC Rogaine (minoxidil) 5% solution/foam as directed.  Oral treatments in female patients who have no contraindication may include : - Low dose oral minoxidil 1.25 - 5mg  daily - Spironolactone 50 - 100mg  bid - Finasteride 2.5 - 5 mg daily Adjunctive therapies include: - Low Level Laser Light Therapy (LLLT) - Platelet-rich plasma injections (PRP) - Hair Transplants or scalp reduction   Treatment Plan: Patient deferred treatment at this time. Ferritin ordered today. Reviewed recent labs from 08/14/2022.   Alopecia  Related Procedures Ferritin  Related Medications minoxidil (LONITEN) 2.5 MG tablet Take 1 tablet (2.5 mg total) by mouth daily.  Bug bite without infection, initial encounter Right lateral Foot - Anterior  Use Elidel or Derm-Otic oil twice daily to affected area.  ROSACEA Exam Mid face erythema with telangiectasias at mid face  Chronic and persistent condition with duration or expected duration over one year. Condition is symptomatic/ bothersome to patient. Not currently at goal.   Rosacea is a chronic progressive skin condition usually affecting the face of adults, causing redness and/or acne bumps. It is treatable but not curable. It sometimes affects the eyes (ocular rosacea) as well. It may respond to topical and/or systemic medication and can flare with stress, sun exposure, alcohol, exercise, topical steroids (including hydrocortisone/cortisone 10) and some foods.  Daily application of broad spectrum spf 30+ sunscreen to face is  recommended to reduce flares.  Patient denies grittiness of the eyes.  Treatment Plan Start Azelaic acid to face every morning.  Continue Ivermectin cream at bedtime Wear  sunscreen daily.   Counseling for BBL / IPL / Laser and Coordination of Care Discussed the treatment option of Broad Band Light (BBL) /Intense Pulsed Light (IPL)/ Laser for skin discoloration, including brown spots and redness.  Typically we recommend at least 1-3 treatment sessions about 5-8 weeks apart for best results.  Cannot have tanned skin when BBL performed, and regular use of sunscreen is advised after the procedure to help maintain results. The patient's condition may also require "maintenance treatments" in the future.  The fee for BBL / laser treatments is $350 per treatment session for the whole face.  A fee can be quoted for other parts of the body.  Insurance typically does not pay for BBL/laser treatments and therefore the fee is an out-of-pocket cost.    SEBORRHEIC DERMATITIS Exam: Pink patches with greasy scale at ears and face  Chronic and persistent condition with duration or expected duration over one year. Condition is symptomatic/ bothersome to patient. Not currently at goal.   Seborrheic Dermatitis is a chronic persistent rash characterized by pinkness and scaling most commonly of the mid face but also can occur on the scalp (dandruff), ears; mid chest, mid back and groin.  It tends to be exacerbated by stress and cooler weather.  People who have neurologic disease may experience new onset or exacerbation of existing seborrheic dermatitis.  The condition is not curable but treatable and can be controlled.  Treatment Plan: Reviewed with pt that condition can be controlled but not cured.  Recurrence is to be expected. Continue Elidel 1 % cream apply to red scaly areas on face once or twice daily as needed Continue Derm-otic 0.01% oil - apply topically to affected areas of ears once or twice daily as needed for flares of seb derm   Return in about 6 months (around 02/25/2023) for Alopecia Follow Up, Rosacea Follow Up, Seborrheic Dermatitis Follow Up.  I, Lawson Radar, CMA,  am acting as scribe for Willeen Niece, MD.   Documentation: I have reviewed the above documentation for accuracy and completeness, and I agree with the above.  Willeen Niece, MD

## 2022-08-27 ENCOUNTER — Telehealth: Payer: Self-pay

## 2022-08-27 LAB — FERRITIN: Ferritin: 34 ng/mL (ref 15–150)

## 2022-08-27 NOTE — Telephone Encounter (Signed)
-----   Message from Willeen Niece, MD sent at 08/27/2022  9:39 AM EDT ----- Ferritin (iron stores) is wnl at 34.  Optimal hair growth is seen at or above 50, so if she wanted to take some iron supplements to boost it a bit more, she could.  May start iron supplement. Start ferrous sulfate 325 mg by mouth every other day with orange juice or vitamin C to help promote absorption.  Avoid taking with dairy (Calcium) or mineral-containing supplements which may decrease absorption.  The side effects of iron are primarily GI in type, such as cramping, constipation, and black stools.    - please call patient

## 2022-08-27 NOTE — Telephone Encounter (Signed)
Called patient and advised of lab results.  Ferritin (iron stores) is wnl at 34.  Optimal hair growth is seen at or above 50, so if she wanted to take some iron supplements to boost it a bit more, she could.   May start iron supplement. Start ferrous sulfate 325 mg by mouth every other day with orange juice or vitamin C to help promote absorption.  Avoid taking with dairy (Calcium) or mineral-containing supplements which may decrease absorption.  The side effects of iron are primarily GI in type, such as cramping, constipation, and black stools.  Patient states that she was not able to tolerate iron supplement in pill form in the past. She will discuss with her PCP for other options.

## 2022-08-27 NOTE — Telephone Encounter (Signed)
-----   Message from Tara Stewart, MD sent at 08/27/2022  9:39 AM EDT ----- Ferritin (iron stores) is wnl at 34.  Optimal hair growth is seen at or above 50, so if she wanted to take some iron supplements to boost it a bit more, she could.  May start iron supplement. Start ferrous sulfate 325 mg by mouth every other day with orange juice or vitamin C to help promote absorption.  Avoid taking with dairy (Calcium) or mineral-containing supplements which may decrease absorption.  The side effects of iron are primarily GI in type, such as cramping, constipation, and black stools.    - please call patient 

## 2022-08-31 NOTE — Patient Instructions (Incomplete)
Start taking Metamucil gummies two of these daily.  Chronic Pain, Adult Chronic pain is a type of pain that lasts or keeps coming back for at least 3-6 months. You may have headaches, pain in the abdomen, or pain in other areas of the body. Chronic pain may be related to an illness, injury, or a health condition. Sometimes, the cause of chronic pain is not known. Chronic pain can make it hard for you to do daily activities. If it is not treated, chronic pain can lead to anxiety and depression. Treatment depends on the cause of your pain and how severe it is. You may need to work with a pain specialist to come up with a treatment plan. Many people benefit from two or more types of treatment to control their pain. Follow these instructions at home: Treatment plan Follow your treatment plan as told by your health care provider. This may include: Gentle, regular exercise. Eating a healthy diet that includes foods such as vegetables, fruits, fish, and lean meats. Mental health therapy (cognitive or behavioral therapy) that changes the way you think or act in response to the pain. This may help improve how you feel. Doing physical therapy exercises to improve movement and strength. Meditation, yoga, acupuncture, or massage therapy. Using the oils from plants in your environment or on your skin (aromatherapy). Other treatments may include: Over-the-counter or prescription medicines. Color, light, or sound therapy. Local electrical stimulation. The electrical pulses help to relieve pain by temporarily stopping the nerve impulses that cause you to feel pain. Injections. These deliver numbing or pain-relieving medicines into the spine or the area of pain.  Medicines Take over-the-counter and prescription medicines only as told by your health care provider. Ask your health care provider if the medicine prescribed to you: Requires you to avoid driving or using machinery. Can cause constipation. You may  need to take these actions to prevent or treat constipation: Drink enough fluid to keep your urine pale yellow. Take over-the-counter or prescription medicines. Eat foods that are high in fiber, such as beans, whole grains, and fresh fruits and vegetables. Limit foods that are high in fat and processed sugars, such as fried or sweet foods. Lifestyle  Ask your health care provider whether you should keep a pain diary. Your health care provider will tell you what information to write in the diary. This may include: When you have pain. What the pain feels like. How medicines and other behaviors or treatments help to reduce the pain. Consider talking with a mental health care provider about how to help manage chronic pain. Consider joining a chronic pain support group. Try to control or lower your stress levels. Talk with your health care provider about ways to do this. General instructions Learn as much as you can about how to manage your chronic pain. Ask your health care provider if an intensive pain rehabilitation program or a chronic pain specialist would be helpful. Check your pain level as told by your health care provider. Ask your health care provider if you should use a pain scale. Contact a health care provider if: Your pain is not controlled with treatment. You have new pain. You have side effects from pain medicine. You feel weak or you have trouble doing your normal activities. You have trouble sleeping or you develop confusion. You lose feeling or have numbness in your body. You lose control of your bowels or bladder. Get help right away if: Your pain suddenly gets much worse. You develop  chest pain. You have trouble breathing or shortness of breath. You faint, or another person sees you faint. These symptoms may be an emergency. Get help right away. Call 911. Do not wait to see if the symptoms will go away. Do not drive yourself to the hospital. Also, get help right away  if: You have thoughts about hurting yourself or others. Take one of these steps if you feel like you may hurt yourself or others, or have thoughts about taking your own life: Go to your nearest emergency room. Call 911. Call the National Suicide Prevention Lifeline at 469-626-8522 or 988. This is open 24 hours a day. Text the Crisis Text Line at (727) 529-6912. This information is not intended to replace advice given to you by your health care provider. Make sure you discuss any questions you have with your health care provider. Document Revised: 10/16/2021 Document Reviewed: 09/18/2021 Elsevier Patient Education  2024 [employer].

## 2022-09-01 ENCOUNTER — Encounter: Payer: Self-pay | Admitting: Nurse Practitioner

## 2022-09-01 ENCOUNTER — Ambulatory Visit: Payer: Medicaid Other | Admitting: Internal Medicine

## 2022-09-01 ENCOUNTER — Ambulatory Visit: Payer: Medicaid Other | Admitting: Nurse Practitioner

## 2022-09-01 ENCOUNTER — Other Ambulatory Visit: Payer: Medicaid Other

## 2022-09-01 VITALS — BP 132/74 | HR 68 | Temp 97.5°F | Ht 62.01 in | Wt 185.4 lb

## 2022-09-01 DIAGNOSIS — M25552 Pain in left hip: Secondary | ICD-10-CM | POA: Diagnosis not present

## 2022-09-01 DIAGNOSIS — G8929 Other chronic pain: Secondary | ICD-10-CM

## 2022-09-01 DIAGNOSIS — M25562 Pain in left knee: Secondary | ICD-10-CM

## 2022-09-01 DIAGNOSIS — M25561 Pain in right knee: Secondary | ICD-10-CM

## 2022-09-01 DIAGNOSIS — D508 Other iron deficiency anemias: Secondary | ICD-10-CM | POA: Diagnosis not present

## 2022-09-01 DIAGNOSIS — D509 Iron deficiency anemia, unspecified: Secondary | ICD-10-CM | POA: Insufficient documentation

## 2022-09-01 DIAGNOSIS — M545 Low back pain, unspecified: Secondary | ICD-10-CM | POA: Diagnosis not present

## 2022-09-01 DIAGNOSIS — M25551 Pain in right hip: Secondary | ICD-10-CM | POA: Diagnosis not present

## 2022-09-01 DIAGNOSIS — M797 Fibromyalgia: Secondary | ICD-10-CM | POA: Diagnosis not present

## 2022-09-01 DIAGNOSIS — G894 Chronic pain syndrome: Secondary | ICD-10-CM

## 2022-09-01 MED ORDER — IBUPROFEN 200 MG PO TABS: 200.0000 mg | ORAL_TABLET | Freq: Four times a day (QID) | ORAL | 4 refills | Status: DC | PRN

## 2022-09-01 MED ORDER — PREDNISONE 10 MG PO TABS: ORAL_TABLET | ORAL | 0 refills | Status: DC

## 2022-09-01 NOTE — Assessment & Plan Note (Signed)
Refer to fibromyalgia plan of care. 

## 2022-09-01 NOTE — Assessment & Plan Note (Signed)
Chronic, with some worsening to left hip.  Seen by pain clinic in past, can not take multiple medications.  Last XR imaging overall stable. Will obtain MR left hip to further assess.  Has history of breast CA and would benefit imaging to ensure no abnormal findings.  Prednisone taper sent in for acute flare.

## 2022-09-01 NOTE — Assessment & Plan Note (Signed)
Diagnosed by dermatology and can not take oral supplement due to GI upset, tried multiple in past.  She was concerned for return of cancer as in past her cancer was diagnosed after being diagnosed with anemia.  Recent mammogram reassuring, due to worsening left hip and lower back pain will obtain MR imaging due to her history and concerns.  Reached out via messaging to oncology to see if patient can obtain infusions via them since already established.

## 2022-09-01 NOTE — Assessment & Plan Note (Signed)
Chronic, ongoing with ?acute flare due to increased stressors with recent hemorrhoid and diagnosis of anemia (she reports her past CA was diagnosed with anemia present).  Currently recommend she continue home regimen.  Has not tolerated multiple medications.  Will send in Prednisone taper which has worked in past when acute flare presents.  Obtain MR imaging of lower back and left hip due to increased pain to these areas and last XR imaging over all reassuring.

## 2022-09-01 NOTE — Assessment & Plan Note (Signed)
Chronic, with some worsening.  Seen by pain clinic in past, can not take multiple medications.  Last XR imaging overall stable. Will obtain MR lower back to further assess.  Has history of breast CA and would benefit imaging to ensure no abnormal findings.  Prednisone taper sent in for acute flare.

## 2022-09-01 NOTE — Progress Notes (Signed)
BP 132/74 (BP Location: Left Arm, Patient Position: Sitting, Cuff Size: Normal)   Pulse 68   Temp (!) 97.5 F (36.4 C) (Oral)   Ht 5' 2.01" (1.575 m)   Wt 185 lb 6.4 oz (84.1 kg)   LMP  (LMP Unknown)   SpO2 98%   BMI 33.90 kg/m    Subjective:    Patient ID: Tonya Odonnell, female    DOB: Oct 02, 1965, 57 y.o.   MRN: [id number]  HPI: Tonya Odonnell is a 57 y.o. female  Chief Complaint  Patient presents with   Bone Pain    On the entire left side   CHRONIC PAIN/FIBROMYALGIA Has underlying fibromyalgia at baseline.  Presents today for complaint of entire left sided pain more to hip and lower back -- started one month ago after hemorrhoid + reports her skin felt on fire (fever like).  Saw GI for follow-up.  States history of polyp removal to this side, precancerous -- she was worried they did not remove everything -- GI provided her cream for hemorrhoid which she has to use until end of July.  Pain to hemorrhoid is improving, but has triggered all her other pain.  Saw oncologist 08/14/22.  Her recent mammogram on 07/30/20 was stable.  Had a visit with dermatology on 08/26/22 -- who recommended iron supplements.  Can not take iron supplements she reports, due to GI discomfort.   She is very concerned for returned of cancer.  Had imaging of lower back and left hip XR on 03/05/22 and 03/24/22 - some degenerative changes noted to left hip.    Has tried Cymbalta, Lyrica, and Gabapentin without benefit.  Saw pain clinic 06/13/21 and was provided Tramadol, she reports no benefit from this visit, makes her feel bad.  Saw rheumatology last 04/10/22 with [facility], they recommended PT -- did attend for period.     Osteopenia present, taking supplement.   Has pain 24/7 to every muscle and bone. Pain control status: uncontrolled Duration: chronic Location: lower back and left hip Quality: dull, aching, and throbbing Current Pain Level: 10/10 Previous Pain Level: 4/10 Breakthrough pain: no Benefit from narcotic  medications: no What Activities task can be accomplished with current medication? Simple tasks only Interested in weaning off narcotics: n/a    Stool softners/OTC fiber: yes  Previous pain specialty evaluation: yes Non-narcotic analgesic meds: yes -- Ibuprofen Narcotic contract: no   Relevant past medical, surgical, family and social history reviewed and updated as indicated. Interim medical history since our last visit reviewed. Allergies and medications reviewed and updated.  Review of Systems  Per HPI unless specifically indicated above     Objective:    BP 132/74 (BP Location: Left Arm, Patient Position: Sitting, Cuff Size: Normal)   Pulse 68   Temp (!) 97.5 F (36.4 C) (Oral)   Ht 5' 2.01" (1.575 m)   Wt 185 lb 6.4 oz (84.1 kg)   LMP  (LMP Unknown)   SpO2 98%   BMI 33.90 kg/m   Wt Readings from Last 3 Encounters:  09/01/22 185 lb 6.4 oz (84.1 kg)  08/21/22 185 lb (83.9 kg)  08/14/22 183 lb 8 oz (83.2 kg)    Physical Exam  Results for orders placed or performed in visit on 08/26/22  Ferritin  Result Value Ref Range   Ferritin 34 15 - 150 ng/mL      Assessment & Plan:   Problem List Items Addressed This Visit       Other   Chronic  arthralgias of knees and hips    Chronic, with some worsening to left hip.  Seen by pain clinic in past, can not take multiple medications.  Last XR imaging overall stable. Will obtain MR left hip to further assess.  Has history of breast CA and would benefit imaging to ensure no abnormal findings.  Prednisone taper sent in for acute flare.      Relevant Medications   ibuprofen (ADVIL) 200 MG tablet   predniSONE (DELTASONE) 10 MG tablet   Other Relevant Orders   MR HIP LEFT WO CONTRAST   Chronic low back pain    Chronic, with some worsening.  Seen by pain clinic in past, can not take multiple medications.  Last XR imaging overall stable. Will obtain MR lower back to further assess.  Has history of breast CA and would benefit  imaging to ensure no abnormal findings.  Prednisone taper sent in for acute flare.      Relevant Medications   ibuprofen (ADVIL) 200 MG tablet   predniSONE (DELTASONE) 10 MG tablet   Other Relevant Orders   MR Lumbar Spine Wo Contrast   Chronic pain syndrome - Primary    Refer to fibromyalgia plan of care.      Relevant Medications   ibuprofen (ADVIL) 200 MG tablet   predniSONE (DELTASONE) 10 MG tablet   Fibromyalgia    Chronic, ongoing with ?acute flare due to increased stressors with recent hemorrhoid and diagnosis of anemia (she reports her past CA was diagnosed with anemia present).  Currently recommend she continue home regimen.  Has not tolerated multiple medications.  Will send in Prednisone taper which has worked in past when acute flare presents.  Obtain MR imaging of lower back and left hip due to increased pain to these areas and last XR imaging over all reassuring.        Relevant Medications   ibuprofen (ADVIL) 200 MG tablet   predniSONE (DELTASONE) 10 MG tablet   Iron deficiency anemia    Diagnosed by dermatology and can not take oral supplement due to GI upset, tried multiple in past.  She was concerned for return of cancer as in past her cancer was diagnosed after being diagnosed with anemia.  Recent mammogram reassuring, due to worsening left hip and lower back pain will obtain MR imaging due to her history and concerns.  Reached out via messaging to oncology to see if patient can obtain infusions via them since already established.        Follow up plan: Return in about 8 weeks (around 10/27/2022) for PAIN LEFT HIP AND LOWER BACK.

## 2022-09-03 ENCOUNTER — Telehealth: Payer: Self-pay | Admitting: Adult Health

## 2022-09-03 NOTE — Telephone Encounter (Signed)
Scheduled appointments per L.Causey. Patient is aware of the made appointments.

## 2022-09-08 ENCOUNTER — Ambulatory Visit: Payer: Medicaid Other | Admitting: Dermatology

## 2022-09-08 DIAGNOSIS — Z419 Encounter for procedure for purposes other than remedying health state, unspecified: Secondary | ICD-10-CM | POA: Diagnosis not present

## 2022-09-13 ENCOUNTER — Ambulatory Visit: Payer: Medicaid Other

## 2022-09-15 ENCOUNTER — Ambulatory Visit: Payer: Medicaid Other | Admitting: Nurse Practitioner

## 2022-09-16 ENCOUNTER — Other Ambulatory Visit: Payer: Self-pay | Admitting: Adult Health

## 2022-09-16 DIAGNOSIS — D508 Other iron deficiency anemias: Secondary | ICD-10-CM

## 2022-09-17 ENCOUNTER — Encounter: Payer: Self-pay | Admitting: Adult Health

## 2022-09-17 ENCOUNTER — Other Ambulatory Visit: Payer: Self-pay

## 2022-09-17 ENCOUNTER — Inpatient Hospital Stay: Payer: Medicaid Other | Attending: Internal Medicine

## 2022-09-17 ENCOUNTER — Inpatient Hospital Stay (HOSPITAL_BASED_OUTPATIENT_CLINIC_OR_DEPARTMENT_OTHER): Payer: Medicaid Other | Admitting: Adult Health

## 2022-09-17 VITALS — BP 145/95 | HR 74 | Temp 97.7°F | Resp 18 | Ht 62.01 in | Wt 184.9 lb

## 2022-09-17 DIAGNOSIS — Z853 Personal history of malignant neoplasm of breast: Secondary | ICD-10-CM | POA: Insufficient documentation

## 2022-09-17 DIAGNOSIS — D508 Other iron deficiency anemias: Secondary | ICD-10-CM

## 2022-09-17 DIAGNOSIS — D509 Iron deficiency anemia, unspecified: Secondary | ICD-10-CM | POA: Insufficient documentation

## 2022-09-17 DIAGNOSIS — Z800 Family history of malignant neoplasm of digestive organs: Secondary | ICD-10-CM | POA: Diagnosis not present

## 2022-09-17 DIAGNOSIS — Z9012 Acquired absence of left breast and nipple: Secondary | ICD-10-CM | POA: Insufficient documentation

## 2022-09-17 LAB — RETIC PANEL
Immature Retic Fract: 5.4 % (ref 2.3–15.9)
RBC.: 5.71 MIL/uL — ABNORMAL HIGH (ref 3.87–5.11)
Retic Count, Absolute: 58.8 10*3/uL (ref 19.0–186.0)
Retic Ct Pct: 1 % (ref 0.4–3.1)
Reticulocyte Hemoglobin: 34.7 pg (ref >27.9)

## 2022-09-17 LAB — IRON AND IRON BINDING CAPACITY (CC-WL,HP ONLY)
Iron: 100 ug/dL (ref 28–170)
Saturation Ratios: 25 % (ref 10.4–31.8)
TIBC: 403 ug/dL (ref 250–450)
UIBC: 303 ug/dL (ref 148–442)

## 2022-09-17 LAB — CMP (CANCER CENTER ONLY)
ALT: 26 U/L (ref 0–44)
AST: 16 U/L (ref 15–41)
Albumin: 3.9 g/dL (ref 3.5–5.0)
Alkaline Phosphatase: 67 U/L (ref 38–126)
Anion gap: 8 (ref 5–15)
BUN: 27 mg/dL — ABNORMAL HIGH (ref 6–20)
CO2: 22 mmol/L (ref 22–32)
Calcium: 9.1 mg/dL (ref 8.9–10.3)
Chloride: 108 mmol/L (ref 98–111)
Creatinine: 0.77 mg/dL (ref 0.44–1.00)
GFR, Estimated: >60 mL/min (ref >60)
Glucose, Bld: 97 mg/dL (ref 70–99)
Potassium: 4.1 mmol/L (ref 3.5–5.1)
Sodium: 138 mmol/L (ref 135–145)
Total Bilirubin: 1 mg/dL (ref 0.3–1.2)
Total Protein: 7 g/dL (ref 6.5–8.1)

## 2022-09-17 LAB — CBC WITH DIFFERENTIAL (CANCER CENTER ONLY)
Abs Immature Granulocytes: 0.11 10*3/uL — ABNORMAL HIGH (ref 0.00–0.07)
Basophils Absolute: 0.2 10*3/uL — ABNORMAL HIGH (ref 0.0–0.1)
Basophils Relative: 1 %
Eosinophils Absolute: 0.1 10*3/uL (ref 0.0–0.5)
Eosinophils Relative: 1 %
HCT: 51.1 % — ABNORMAL HIGH (ref 36.0–46.0)
Hemoglobin: 17.1 g/dL — ABNORMAL HIGH (ref 12.0–15.0)
Immature Granulocytes: 1 %
Lymphocytes Relative: 34 %
Lymphs Abs: 3.7 10*3/uL (ref 0.7–4.0)
MCH: 29.3 pg (ref 26.0–34.0)
MCHC: 33.5 g/dL (ref 30.0–36.0)
MCV: 87.7 fL (ref 80.0–100.0)
Monocytes Absolute: 0.8 10*3/uL (ref 0.1–1.0)
Monocytes Relative: 7 %
Neutro Abs: 5.9 10*3/uL (ref 1.7–7.7)
Neutrophils Relative %: 56 %
Platelet Count: 300 10*3/uL (ref 150–400)
RBC: 5.83 MIL/uL — ABNORMAL HIGH (ref 3.87–5.11)
RDW: 14.3 % (ref 11.5–15.5)
WBC Count: 10.8 10*3/uL — ABNORMAL HIGH (ref 4.0–10.5)
nRBC: 0 % (ref 0.0–0.2)

## 2022-09-17 LAB — VITAMIN B12: Vitamin B-12: 300 pg/mL (ref 180–914)

## 2022-09-17 LAB — FERRITIN: Ferritin: 43 ng/mL (ref 11–307)

## 2022-09-17 NOTE — Progress Notes (Signed)
[facility] Cancer Follow up:    Tonya Skiff, NP [address] [place] [address]   DIAGNOSIS:  Cancer Staging  No matching staging information was found for the patient.  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, DCIS low-grade, lymphovascular invasion present, 1/2 lymph nodes positive ER positive greater than 95%, PR positive greater than 95%, HER2 equivocal 2+ by IHC and FISH was negative (Dr.Lo)   01/17/2015 Surgery   Left mastectomy (done because of close margins): Residual LCIS, 1/5 lymph nodes positive (total 2/10 lymph nodes positive)   01/17/2015 Oncotype testing   Oncotype DX score: 20   2016 - 2017 Chemotherapy   Adjuvant chemotherapy (patient is not sure of what drugs she received but that she had 8 doses)   07/28/2015 -  Anti-estrogen oral therapy   Patient was on and off antiestrogen therapy since May 2017.  Unable to tolerate tamoxifen or Zoladex.  Eventually after she became menopausal, started on Arimidex January 2021 (takes Cymbalta for hot flashes). Reports discontinued in Dec 2022 as advised by Oncologist in [place].      CURRENT THERAPY: observation  INTERVAL HISTORY: [name] 57 y.o. female returns for follow up and evaluation because she has been on medication for hair loss for a year.  She has continued to experience decreased hair loss.  Her dermatologist checked her ferritin and noted it was 34 and for healthy growth it needed to be above 50.  She has been unable to tolerate IV iron previously.  She is nervous because she previously had iron deficiency during her cancer diagnosis and treatment.   She  just finished a steroid taper prescribed by her PCP.  She says that she feels much better after her long taper.  Her WBC is 10.8 today.  She was seen by rheumatology and told she doesn't have rheumatoid arthritis, but osteoarthritis.  The weather and humidity has been challenging and she says she hasn't been drinking as much water as she could have been.    She remains concerned about getting another cancer due to having received the H1N1 vaccine and wants a bone scan to look at her bones, particularly those around her hip and lower back.  Her PCP has ordered an MRI of these areas that is awaiting insurance authorization.   Patient Active Problem List   Diagnosis Date Noted   Iron deficiency anemia 09/01/2022   Hemorrhoids 08/14/2022   Lichen sclerosus of female genitalia 12/25/2021   Adrenal adenoma, left 09/17/2021   Pain management contract signed 06/13/2021   Chronic pain syndrome 06/03/2021   Chronic arthralgias of knees and hips 05/06/2021   Osteopenia of lumbar spine 05/05/2021   Obesity 05/05/2021   Anxiety 12/05/2020   Gallstones 12/05/2020   Gastroesophageal reflux disease without esophagitis 12/05/2020   Prediabetes 12/05/2020   Hyperlipidemia 02/08/2018   Hepatitis B core antibody positive 08/07/2017   History of breast cancer 12/13/2014   Chronic low back pain 02/17/2014   Fibromyalgia 10/26/2013   Adverse reaction to influenza vaccine 11/22/2012   Essential (primary) hypertension 05/05/2012   Hashimoto's thyroiditis 05/05/2012    is allergic  to lisinopril.  MEDICAL HISTORY: Past Medical History:  Diagnosis Date   Anemia    "USE TO HAVE IT,NOT NOW,2015,2016   Arthritis    HIPS,KNEES,HANDS   Breast cancer in female (HCC)    Cancer (HCC)    LEFT   Hyperlipidemia    Hypertension    Neuromuscular disorder (HCC)    MUSCLE PAIN,FIBROMYALGIA   Osteopenia    PVC (premature ventricular contraction)    Thyroid disease     SURGICAL HISTORY: Past Surgical History:   Procedure Laterality Date   Left Ovarian removal Left    MASTECTOMY Left    "LYMPHNODES REMOVED    SOCIAL HISTORY: Social History   Socioeconomic History   Marital status: Married    Spouse name: Not on file   Number of children: Not on file   Years of education: Not on file   Highest education level: Not on file  Occupational History   Occupation: disabled  Tobacco Use   Smoking status: Never   Smokeless tobacco: Never  Vaping Use   Vaping Use: Never used  Substance and Sexual Activity   Alcohol use: Never   Drug use: Never   Sexual activity: Not Currently  Other Topics Concern   Not on file  Social History Narrative   Not on file   Social Determinants of Health   Financial Resource Strain: Not on file  Food Insecurity: Not on file  Transportation Needs: Not on file  Physical Activity: Not on file  Stress: Not on file  Social Connections: Not on file  Intimate Partner Violence: Not on file    FAMILY HISTORY: Family History  Problem Relation Age of Onset   Hypertension Mother    Leukemia Mother    Stomach cancer Father    Colon cancer Neg Hx    Colon polyps Neg Hx    Crohn's disease Neg Hx    Esophageal cancer Neg Hx    Rectal cancer Neg Hx     Review of Systems  Constitutional:  Positive for fatigue. Negative for appetite change, chills, fever and unexpected weight change.  HENT:   Negative for hearing loss, lump/mass and trouble swallowing.   Eyes:  Negative for eye problems and icterus.  Respiratory:  Negative for chest tightness, cough and shortness of breath.   Cardiovascular:  Negative for chest pain, leg swelling and palpitations.  Gastrointestinal:  Negative for abdominal distention, abdominal pain, constipation, diarrhea, nausea and vomiting.  Endocrine: Negative for hot flashes.  Genitourinary:  Negative for difficulty urinating.   Musculoskeletal:  Positive for arthralgias, back pain and myalgias.  Skin:  Negative for itching and rash.   Neurological:  Negative for dizziness, extremity weakness, headaches and numbness.  Hematological:  Negative for adenopathy. Does not bruise/bleed easily.  Psychiatric/Behavioral:  Negative for depression. The patient is not nervous/anxious.       PHYSICAL EXAMINATION   Onc Performance Status - 09/17/22 0827       ECOG Perf Status   ECOG Perf Status Fully active, able to carry on all pre-disease performance without restriction      KPS SCALE   KPS % SCORE Able to carry on normal activity, minor s/s of disease             Vitals:   09/17/22 0825  BP: (!) 145/95  Pulse: 74  Resp: 18  Temp: 97.7 F (36.5 C)  SpO2: 97%    Physical Exam Constitutional:      General: She is not in  acute distress.    Appearance: Normal appearance. She is not toxic-appearing.  HENT:     Head: Normocephalic and atraumatic.     Mouth/Throat:     Mouth: Mucous membranes are moist.     Pharynx: Oropharynx is clear. No oropharyngeal exudate or posterior oropharyngeal erythema.  Eyes:     General: No scleral icterus. Cardiovascular:     Rate and Rhythm: Normal rate and regular rhythm.     Pulses: Normal pulses.     Heart sounds: Normal heart sounds.  Pulmonary:     Effort: Pulmonary effort is normal.     Breath sounds: Normal breath sounds.  Abdominal:     General: Abdomen is flat. Bowel sounds are normal. There is no distension.     Palpations: Abdomen is soft.     Tenderness: There is no abdominal tenderness.  Musculoskeletal:        General: No swelling.     Cervical back: Neck supple.  Lymphadenopathy:     Cervical: No cervical adenopathy.  Skin:    General: Skin is warm and dry.     Findings: No rash.  Neurological:     General: No focal deficit present.     Mental Status: She is alert.  Psychiatric:        Mood and Affect: Mood normal.        Behavior: Behavior normal.     LABORATORY DATA:  CBC    Component Value Date/Time   WBC 10.8 (H) 09/17/2022 0801   WBC  6.8 08/14/2022 1324   RBC 5.71 (H) 09/17/2022 0802   RBC 5.83 (H) 09/17/2022 0801   HGB 17.1 (H) 09/17/2022 0801   HGB 15.7 07/08/2022 1106   HCT 51.1 (H) 09/17/2022 0801   HCT 48.3 (H) 07/08/2022 1106   PLT 300 09/17/2022 0801   PLT 328 07/08/2022 1106   MCV 87.7 09/17/2022 0801   MCV 89 07/08/2022 1106   MCH 29.3 09/17/2022 0801   MCHC 33.5 09/17/2022 0801   RDW 14.3 09/17/2022 0801   RDW 13.7 07/08/2022 1106   LYMPHSABS 3.7 09/17/2022 0801   LYMPHSABS 3.0 07/08/2022 1106   MONOABS 0.8 09/17/2022 0801   EOSABS 0.1 09/17/2022 0801   EOSABS 0.1 07/08/2022 1106   BASOSABS 0.2 (H) 09/17/2022 0801   BASOSABS 0.1 07/08/2022 1106    CMP     Component Value Date/Time   NA 138 09/17/2022 0801   NA 140 07/08/2022 1106   K 4.1 09/17/2022 0801   CL 108 09/17/2022 0801   CO2 22 09/17/2022 0801   GLUCOSE 97 09/17/2022 0801   BUN 27 (H) 09/17/2022 0801   BUN 17 07/08/2022 1106   CREATININE 0.77 09/17/2022 0801   CALCIUM 9.1 09/17/2022 0801   PROT 7.0 09/17/2022 0801   PROT 6.7 07/08/2022 1106   ALBUMIN 3.9 09/17/2022 0801   ALBUMIN 4.4 07/08/2022 1106   AST 16 09/17/2022 0801   ALT 26 09/17/2022 0801   ALKPHOS 67 09/17/2022 0801   BILITOT 1.0 09/17/2022 0801   GFRNONAA >60 09/17/2022 0801        ASSESSMENT and THERAPY PLAN:   Iron deficiency anemia M is a 57 year old woman with history of breast cancer here today for follow-up about her recent iron studies.  She was recommended oral iron supplementation for her hair loss.  We obtained iron studies today and the full panel is pending.  We will call her once we have results.  I did give her a handout on an  iron rich diet and we discussed that vitamin C aids and iron absorption.  I reviewed with Tobi Bastos that the MRI of the spine and hip will be helpful in evaluating her bones further that has been ordered by her PCP.  Those are the test that I would proceed with.  Verbalized understanding.  We will call her once we have her  lab results back.  She does not have to see [place] on a regular basis for her history of breast cancer, however we are always happy to see her once a year if she desires.   All questions were answered. The patient knows to call the clinic with any problems, questions or concerns. We can certainly see the patient much sooner if necessary.  Total encounter time:30 minutes*in face-to-face visit time, chart review, lab review, care coordination, order entry, and documentation of the encounter time.    Lillard Anes, NP 09/17/22 9:06 AM Medical Oncology and Hematology [facility] [address] [place], [place] [address] Tel. [phone]    Fax. [phone]  *Total Encounter Time as defined by the Centers for Medicare and Medicaid Services includes, in addition to the face-to-face time of a patient visit (documented in the note above) non-face-to-face time: obtaining and reviewing outside history, ordering and reviewing medications, tests or procedures, care coordination (communications with other health care professionals or caregivers) and documentation in the medical record.

## 2022-09-17 NOTE — Assessment & Plan Note (Signed)
Tonya Odonnell is a 57 year old woman with history of breast cancer here today for follow-up about her recent iron studies.  She was recommended oral iron supplementation for her hair loss.  We obtained iron studies today and the full panel is pending.  We will call her once we have results.  I did give her a handout on an iron rich diet and we discussed that vitamin C aids and iron absorption.  I reviewed with Tonya Odonnell that the MRI of the spine and hip will be helpful in evaluating her bones further that has been ordered by her PCP.  Those are the test that I would proceed with.  Verbalized understanding.  We will call her once we have her lab results back.  She does not have to see [place] on a regular basis for her history of breast cancer, however we are always happy to see her once a year if she desires.

## 2022-09-17 NOTE — Patient Instructions (Signed)
Iron-Rich Diet  Iron is a mineral that helps your body produce hemoglobin. Hemoglobin is a protein in red blood cells that carries oxygen to your body's tissues. Eating too little iron may cause you to feel weak and tired, and it can increase your risk of infection. Iron is naturally found in many foods, and many foods have iron added to them (are iron-fortified). You may need to follow an iron-rich diet if you do not have enough iron in your body due to certain medical conditions. The amount of iron that you need each day depends on your age, your sex, and any medical conditions you have. Follow instructions from your health care provider or a dietitian about how much iron you should eat each day. What are tips for following this plan? Reading food labels Check food labels to see how many milligrams (mg) of iron are in each serving. Cooking Cook foods in pots and pans that are made from iron. Take these steps to make it easier for your body to absorb iron from certain foods: Soak beans overnight before cooking. Soak whole grains overnight and drain them before using. Ferment flours before baking, such as by using yeast in bread dough. Meal planning When you eat foods that contain iron, you should eat them with foods that are high in vitamin C. These include oranges, peppers, tomatoes, potatoes, and mangoes. Vitamin C helps your body absorb iron. Certain foods and drinks prevent your body from absorbing iron properly. Avoid eating these foods in the same meal as iron-rich foods or with iron supplements. These foods include: Coffee, black tea, and red wine. Milk, dairy products, and foods that are high in calcium. Beans and soybeans. Whole grains. General information Take iron supplements only as told by your health care provider. An overdose of iron can be life-threatening. If you were prescribed iron supplements, take them with orange juice or a vitamin C supplement. When you eat  iron-fortified foods or take an iron supplement, you should also eat foods that naturally contain iron, such as meat, poultry, and fish. Eating naturally iron-rich foods helps your body absorb the iron that is added to other foods or contained in a supplement. Iron from animal sources is better absorbed than iron from plant sources. What foods should I eat? Fruits Prunes. Raisins. Eat fruits high in vitamin C, such as oranges, grapefruits, and strawberries, with iron-rich foods. Vegetables Spinach (cooked). Green peas. Broccoli. Fermented vegetables. Eat vegetables high in vitamin C, such as leafy greens, potatoes, bell peppers, and tomatoes, with iron-rich foods. Grains Iron-fortified breakfast cereal. Iron-fortified whole-wheat bread. Enriched rice. Sprouted grains. Meats and other proteins Beef liver. Beef. Turkey. Chicken. Oysters. Shrimp. Tuna. Sardines. Chickpeas. Nuts. Tofu. Pumpkin seeds. Beverages Tomato juice. Fresh orange juice. Prune juice. Hibiscus tea. Iron-fortified instant breakfast shakes. Sweets and desserts Blackstrap molasses. Seasonings and condiments Tahini. Fermented soy sauce. Other foods Wheat germ. The items listed above may not be a complete list of recommended foods and beverages. Contact a dietitian for more information. What foods should I limit? These are foods that should be limited while eating iron-rich foods as they can reduce the absorption of iron in your body. Grains Whole grains. Bran cereal. Bran flour. Meats and other proteins Soybeans. Products made from soy protein. Black beans. Lentils. Mung beans. Split peas. Dairy Milk. Cream. Cheese. Yogurt. Cottage cheese. Beverages Coffee. Black tea. Red wine. Sweets and desserts Cocoa. Chocolate. Ice cream. Seasonings and condiments Basil. Oregano. Large amounts of parsley. The items listed   above may not be a complete list of foods and beverages you should limit. Contact a dietitian for more  information. Summary Iron is a mineral that helps your body produce hemoglobin. Hemoglobin is a protein in red blood cells that carries oxygen to your body's tissues. Iron is naturally found in many foods, and many foods have iron added to them (are iron-fortified). When you eat foods that contain iron, you should eat them with foods that are high in vitamin C. Vitamin C helps your body absorb iron. Certain foods and drinks prevent your body from absorbing iron properly, such as whole grains and dairy products. You should avoid eating these foods in the same meal as iron-rich foods or with iron supplements. This information is not intended to replace advice given to you by your health care provider. Make sure you discuss any questions you have with your health care provider. Document Revised: 02/06/2020 Document Reviewed: 02/06/2020 Elsevier Patient Education  2024 Elsevier Inc.  

## 2022-09-18 ENCOUNTER — Telehealth: Payer: Self-pay | Admitting: *Deleted

## 2022-09-18 ENCOUNTER — Encounter: Payer: Self-pay | Admitting: *Deleted

## 2022-09-18 NOTE — Telephone Encounter (Signed)
-----   Message from Noreene Filbert sent at 09/18/2022  1:17 PM EDT ----- Please let patient know that her iron studies do not indicate an iron deficiency that necessitates the use of IV iron.  I would recommend that she continue with iron rich diet, vitamin c  to aid her iron absorption.  I also recommend Vitamin B12 sublingual daily.    Should she want to pursue iron infusions for her hair, I would recommend she call dermatology to see if they can order and arrange. ----- Message ----- From: Interface, [facility] In [facility] Sent: 09/17/2022   8:16 AM EDT To: Loa Socks, NP

## 2022-09-18 NOTE — Telephone Encounter (Signed)
We reviewed this at her visit yesterday.  Her WBC are elevated due to her recent steroids pack.  Her RBC, Hgb, and Hct are increased due to needing to drink more water which she agreed she thought she should do more of as well.

## 2022-09-18 NOTE — Telephone Encounter (Signed)
Notified of message below. Pt wants to know why her WBC, RBC, Hgb and Hct are increasing.Tonya Odonnell

## 2022-09-19 ENCOUNTER — Encounter: Payer: Self-pay | Admitting: Nurse Practitioner

## 2022-09-19 DIAGNOSIS — M797 Fibromyalgia: Secondary | ICD-10-CM

## 2022-09-19 DIAGNOSIS — G894 Chronic pain syndrome: Secondary | ICD-10-CM

## 2022-09-19 DIAGNOSIS — G8929 Other chronic pain: Secondary | ICD-10-CM

## 2022-09-22 NOTE — Addendum Note (Signed)
Addended by: Aura Dials T on: 09/22/2022 11:55 AM   Modules accepted: Orders

## 2022-09-25 ENCOUNTER — Ambulatory Visit: Payer: Medicaid Other | Admitting: Dermatology

## 2022-09-30 ENCOUNTER — Ambulatory Visit: Payer: Medicaid Other | Admitting: Adult Health

## 2022-10-09 DIAGNOSIS — Z419 Encounter for procedure for purposes other than remedying health state, unspecified: Secondary | ICD-10-CM | POA: Diagnosis not present

## 2022-10-15 ENCOUNTER — Encounter: Payer: Self-pay | Admitting: Nurse Practitioner

## 2022-10-15 ENCOUNTER — Telehealth: Payer: Self-pay | Admitting: Nurse Practitioner

## 2022-10-15 NOTE — Telephone Encounter (Signed)
See mychart communication with the patient.

## 2022-10-15 NOTE — Telephone Encounter (Signed)
Copied from CRM [id number]. Topic: Complaint - Staff >> Oct 15, 2022 10:40 AM Franchot Heidelberg wrote: Date of Incident: 09/12/2022 Details of complaint: Tonya Odonnell, [employer]. Complaints "not informed about prior auth. appt got cancelled, in pain and upset with staff and not the provider.  09/12/2022" How would the patient like to see it resolved? Would like for the staff to do better and inform her if her appt has been cancelled On a scale of 1-10, how was your experience? Spoke to [facility] rep, not pt  What would it take to bring it to a 10? Spoke to [facility] rep, not pt   Tonya Odonnell from [facility] called to share this information  Best contact: [phone]  Tonya Odonnell is requesting a call back from the clinic to discuss the prior   Route to Research officer, political party.

## 2022-10-15 NOTE — Telephone Encounter (Signed)
Copied from CRM [id number]. Topic: General - Other >> Oct 15, 2022  3:38 PM Clide Dales wrote: Patient is requesting a dr. Note to be excused from [employer]. Please advise.

## 2022-10-16 NOTE — Therapy (Signed)
OUTPATIENT PHYSICAL THERAPY ONCOLOGY EVALUATION  Patient Name: Tonya Odonnell MRN: [id number] DOB:[birth year], 57 y.o., female Today's Date: 10/17/2022  END OF SESSION:  PT End of Session - 10/17/22 1110     Visit Number 1    Number of Visits 16    Date for PT Re-Evaluation 12/12/22    PT Start Time 1107    PT Stop Time 1150    PT Time Calculation (min) 43 min    Activity Tolerance Patient tolerated treatment well    Behavior During Therapy WFL for tasks assessed/performed             Past Medical History:  Diagnosis Date   Anemia    "USE TO HAVE IT,NOT NOW,2015,2016   Arthritis    HIPS,KNEES,HANDS   Breast cancer in female (HCC)    Cancer (HCC)    LEFT   Hyperlipidemia    Hypertension    Neuromuscular disorder (HCC)    MUSCLE PAIN,FIBROMYALGIA   Osteopenia    PVC (premature ventricular contraction)    Thyroid disease    Past Surgical History:  Procedure Laterality Date   Left Ovarian removal Left    MASTECTOMY Left    "LYMPHNODES REMOVED   Patient Active Problem List   Diagnosis Date Noted   Iron deficiency anemia 09/01/2022   Hemorrhoids 08/14/2022   Lichen sclerosus of female genitalia 12/25/2021   Adrenal adenoma, left 09/17/2021   Pain management contract signed 06/13/2021   Chronic pain syndrome 06/03/2021   Chronic arthralgias of knees and hips 05/06/2021   Osteopenia of lumbar spine 05/05/2021   Obesity 05/05/2021   Anxiety 12/05/2020   Gallstones 12/05/2020   Gastroesophageal reflux disease without esophagitis 12/05/2020   Prediabetes 12/05/2020   Hyperlipidemia 02/08/2018   Hepatitis B core antibody positive 08/07/2017   History of breast cancer 12/13/2014   Chronic low back pain 02/17/2014   Fibromyalgia 10/26/2013   Adverse reaction to influenza vaccine 11/22/2012   Essential (primary) hypertension 05/05/2012   Hashimoto's thyroiditis 05/05/2012      REFERRING PROVIDER: Aura Dials, NP  REFERRING DIAG: Chronic LBP, Fibomyalgia,  hip and knee pain  THERAPY DIAG:  Personal history of breast cancer  Muscle weakness (generalized)  Other low back pain  Pain in left hip  Chronic pain of both knees  Fibromyalgia syndrome  Chronic pain disorder  ONSET DATE: 2016 with worsening of symptoms  Rationale for Evaluation and Treatment: Rehabilitation  SUBJECTIVE:  SUBJECTIVE STATEMENT:  Pt requires an MRI of her spine and hip due to progression of pain and concerns that her cancer may have returned. For insurance to cover her MRI she must do 6 weeks of therapy. I have pain all over my bones and joints that happened after chemo. I think I have lymphedema all over.  After the feeling of flush of water in 2016 I started hurting. Bilateral LBP/sacral pain is painful with bending, but I hurt all over in my skin and bones. It is a generalized pain overall. After she does cleaning or weeding it takes her a week to get over it.The Dr.  gave me steroids in February  and July and my skin felt better and I lost a lot of fluid. She has poor sleep, difficulty walking, bending to put on shoes, going up and down stairs. She has to lean on the cart to shop.     PERTINENT HISTORY:  Pt is s/p left mastectomy in 2016 with chemotherapy and hormonal therapy which was discontinued in 2022.[name] She was diagnosed with FMS a number of years ago. She complains of  diffuse body aches and retention of fluid. After she finished chemo in 2016  she was on Nortriptyline. When she stopped it she felt a flush of water in her body and since then she has pain in muscles and skin. When she exercises the next day she feels like she can't move. She has tried several different anti depressant medications. Sometimes ibuprofen helps as long as she only takes it 1x/day. She can't sit in  chairs more than 20 minutes because it bothers her back. She has been trying to find out for 7 years what is going on. She has arthritis and muscle pain all together. She has a sharp knife like pain in her LB .She was given steroids and muscle relaxer. The back pain started to resolve and she was urinating 5-6 times at night.and she started feeling so much better. She was getting extremely tired and just couldn't do much.  She is no longer on the meds and the pain is slowly starting to come back. She was diagnosed with Fibromyalgia after a swine flu vaccine. She got the vaccine on the left side and her left side of the body felt very different from the right. It got worse in 2015. After that they found the cancer in her LN's. She has swelling in the left UE, and had treatment in [place] with MLD and she has a compression sleeve but she doesn't wear it. She is not interested in being treated for lymphedema as she was treated in [place].[name]     PAIN:  Are you having pain? Yes NPRS scale: 8/10 Pain location: all over Pain orientation: Bilateral  PAIN TYPE: feel heavy like I am holding a lot of weight,fatigue Pain description: constant  Aggravating factors: its always there but worse with cleaning, cooking, up stairs, pulling weeds Relieving factors: better in the am, worse in evening  PRECAUTIONS: Left UE lymphedema, FMS, OA , poor vision   WEIGHT BEARING RESTRICTIONS: No  FALLS:  Has patient fallen in last 6 months? No  LIVING ENVIRONMENT: Lives with: lives with their son Lives in: House/apartment Has following equipment at home: Single point cane and Environmental consultant - 2 wheeled  OCCUPATION: Does not work  LEISURE: TV, Dealer  HAND DOMINANCE: right   PRIOR LEVEL OF FUNCTION: Independent  PATIENT GOALS: Decrease pain, greater ability to be active, clean home  OBJECTIVE:  COGNITION: Overall cognitive status: Within functional limits for tasks assessed   PALPATION: no tenderness  with moderate palpation to UT, upper and LB, UE Fibromyalgia points; mainly hurts now with using her muscle massager. Improved since steroids in July  OBSERVATIONS / OTHER ASSESSMENTS: uses hands for rising from chair, wearing slippers because shoes hurt her skin  SENSATION: Light touch: Deficits     POSTURE:  forward head, rounded shoulders   UPPER EXTREMITY AROM/PROM:  A/PROM RIGHT   eval   Shoulder extension   Shoulder flexion   Shoulder abduction   Shoulder internal rotation   Shoulder external rotation     (Blank rows = not tested)  A/PROM LEFT   eval  Shoulder extension   Shoulder flexion   Shoulder abduction   Shoulder internal rotation   Shoulder external rotation     (Blank rows = not tested)   LUMBAR:  FB   hands to mid lower leg  BB Decreased 70%, pain bilateral LBP Right SB; to knee: pain bilateral LB, then resolves, now I feel dizzy Left SB to knee: no pain     LOWER EXTREMITY AROM/PROM:  A/PROM Right eval  Hip flexion 3  Hip extension 1 bridge very fatigued  Hip abduction   Hip adduction   Hip internal rotation 4  Hip external rotation 4  Knee flexion 4  Knee extension 4  Ankle dorsiflexion 4+  Ankle plantarflexion 4  Ankle inversion   Ankle eversion    (Blank rows = not tested)  A/PROM LEFT eval  Hip flexion 3-  Hip extension 1 bridge very fatigued  Hip abduction 4  Hip adduction 4  Hip internal rotation 3+  Hip external rotation 3+  Knee flexion 4  Knee extension 4  Ankle dorsiflexion 4+  Ankle plantarflexion 4  Ankle inversion   Ankle eversion    (Blank rows = not tested)  LYMPHEDEMA ASSESSMENTS: Pt. Said she has a compression sleeve but doesn't wear it. She does not want to be treated for lymphedema.   SURGERY TYPE/DATE: Left Lumpectomy 12/2014 for Gr. 2 IDC, Mastectomy 01/2015 due to close margins  NUMBER OF LYMPH NODES REMOVED: 2/10  CHEMOTHERAPY: YES  RADIATION:NO  HORMONE TREATMENT: YES  INFECTIONS:  NO  LYMPHEDEMA ASSESSMENTS: Pt has a compression sleeve but does not wear. She doesn't want to treat her lymphedema  LANDMARK RIGHT  eval  10 cm proximal to olecranon process   Olecranon process   10 cm proximal to ulnar styloid process   Just proximal to ulnar styloid process   Across hand at thumb web space   At base of 2nd digit   (Blank rows = not tested)  LANDMARK LEFT  eval  10 cm proximal to olecranon process   Olecranon process   10 cm proximal to ulnar styloid process   Just proximal to ulnar styloid process   Across hand at thumb web space   At base of 2nd digit   (Blank rows = not tested)   FUNCTIONAL TESTS:  5x sit to stand  22.65 seconds  GAIT: Distance walked: front desk to room 8 Assistive device utilized: None Level of assistance: Complete Independence  LOWER Extremity Functional Scale; 13/80   TODAY'S TREATMENT:  DATE:  Discussed POC and different treatments Ie aquatics. Pt is interested in trying aquatics  PATIENT EDUCATION:  Education details: POC, length of stay, Cancellation prn done day before Person educated: Patient Education method: Explanation Education comprehension: verbalized understanding  HOME EXERCISE PROGRAM: NA  ASSESSMENT:  CLINICAL IMPRESSION:  Patient is a 57 y.o. female who was seen today for physical therapy evaluation and treatment for complaints of global back and lower extremity pain. Back pain upper, middle and lower, easy fatigue and limitations in all activities due to diffuse pain that has been present for a number of years. She also has subjective complaints of swelling throughout the body, and fatigue. There is no tenderness to palpation today of some of the major fibromyalgia points. She indicates this improved with the steroids. She has difficulty shopping and must lean on a cart,  bending to put on shoes, disturbed sleep, difficulty walking and going up and down stairs.  She will benefit from physical therapy to gently progress ROM and flexibility, postural strength, and overall endurance for improvements in home and community  activities    OBJECTIVE IMPAIRMENTS: . decreased activity tolerance, decreased endurance, decreased knowledge of condition, difficulty walking, decreased ROM, decreased strength, increased edema, impaired UE functional use, impaired vision/preception, postural dysfunction, and pain.   ACTIVITY LIMITATIONS: carrying, lifting, bending, sitting, standing, sleeping, stairs, and reach over head   PARTICIPATION LIMITATIONS:  cleaning, community activity, and yard work   PERSONAL FACTORS: Behavior pattern, Time since onset of injury/illness/exacerbation, and 3+ comorbidities: Left breast cancer s/p chemo  are also affecting patient's functional outcome.   REHAB POTENTIAL: Fair    CLINICAL DECISION MAKING: Stable/uncomplicated  EVALUATION COMPLEXITY: Low  GOALS: Goals reviewed with patient? Yes  SHORT TERM GOALS=LONG TERM GOALS: Target date: 6 weeks  Pt will be independent  and a tolerant of a HEP for gentle ROM/stretching Baseline: Goal status: INITIAL  2.  Pt will have improved strength of Bilateral LE hip flexion, IR and ER to at least 4/5 for improved safety  Baseline:  Goal status: INITIAL  3.  Pt will be able to shop for 25-50% longer periods of time without leaning over the shopping cart Baseline:  Goal status: INITIAL  4.  Pt will be tolerant of low level strengthening to improve core,and LE endurance and strength Baseline:  Goal status: INITIAL  5.  LEFS will be increased to 35/80  demonstrate improved function.  Baseline:13/80  Goal status:INITIAL  6   Pt will be able to perform 5 bridges with 3 second hold to improve strength for bed mobility  Goal Status:  INITIAL  7.  Pt will perform 5 sit to stands in 15 seconds to  demonstrate improved strength /function  Baseline:2.6 seconds  Goal Status:INITIAL   PLAN:  PT FREQUENCY: 2x/week  PT DURATION: 8 weeks  PLANNED INTERVENTIONS: Therapeutic exercises, Therapeutic activity, Neuromuscular re-education, Gait training, Patient/Family education, Self Care, Joint mobilization, Orthotic/Fit training, Dry Needling, Manual therapy, and Re-evaluation, aquatic therapy  PLAN FOR NEXT SESSION: Meeks decompression,sit to stand, Faber stretch, knee to chest, ppt with marching, lumbar stabs, Nu Step; start slowly and progress,   Waynette Buttery, PT 10/17/2022, 7:34 PM

## 2022-10-17 ENCOUNTER — Ambulatory Visit: Payer: Medicaid Other | Attending: Nurse Practitioner

## 2022-10-17 ENCOUNTER — Other Ambulatory Visit: Payer: Self-pay

## 2022-10-17 ENCOUNTER — Encounter: Payer: Self-pay | Admitting: Nurse Practitioner

## 2022-10-17 DIAGNOSIS — M5459 Other low back pain: Secondary | ICD-10-CM

## 2022-10-17 DIAGNOSIS — M797 Fibromyalgia: Secondary | ICD-10-CM | POA: Diagnosis not present

## 2022-10-17 DIAGNOSIS — Z853 Personal history of malignant neoplasm of breast: Secondary | ICD-10-CM

## 2022-10-17 DIAGNOSIS — M6281 Muscle weakness (generalized): Secondary | ICD-10-CM

## 2022-10-17 DIAGNOSIS — M25562 Pain in left knee: Secondary | ICD-10-CM | POA: Insufficient documentation

## 2022-10-17 DIAGNOSIS — M5441 Lumbago with sciatica, right side: Secondary | ICD-10-CM | POA: Diagnosis not present

## 2022-10-17 DIAGNOSIS — M25551 Pain in right hip: Secondary | ICD-10-CM | POA: Insufficient documentation

## 2022-10-17 DIAGNOSIS — M25561 Pain in right knee: Secondary | ICD-10-CM | POA: Diagnosis not present

## 2022-10-17 DIAGNOSIS — G894 Chronic pain syndrome: Secondary | ICD-10-CM | POA: Diagnosis not present

## 2022-10-17 DIAGNOSIS — G8929 Other chronic pain: Secondary | ICD-10-CM

## 2022-10-17 DIAGNOSIS — M25552 Pain in left hip: Secondary | ICD-10-CM | POA: Diagnosis not present

## 2022-10-17 DIAGNOSIS — M5442 Lumbago with sciatica, left side: Secondary | ICD-10-CM | POA: Insufficient documentation

## 2022-10-20 ENCOUNTER — Ambulatory Visit: Payer: Medicaid Other | Admitting: Adult Health

## 2022-10-21 ENCOUNTER — Ambulatory Visit: Payer: Medicaid Other

## 2022-10-21 DIAGNOSIS — G894 Chronic pain syndrome: Secondary | ICD-10-CM | POA: Diagnosis not present

## 2022-10-21 DIAGNOSIS — M25562 Pain in left knee: Secondary | ICD-10-CM | POA: Diagnosis not present

## 2022-10-21 DIAGNOSIS — M25552 Pain in left hip: Secondary | ICD-10-CM | POA: Diagnosis not present

## 2022-10-21 DIAGNOSIS — M797 Fibromyalgia: Secondary | ICD-10-CM | POA: Diagnosis not present

## 2022-10-21 DIAGNOSIS — Z853 Personal history of malignant neoplasm of breast: Secondary | ICD-10-CM

## 2022-10-21 DIAGNOSIS — M5459 Other low back pain: Secondary | ICD-10-CM

## 2022-10-21 DIAGNOSIS — M5442 Lumbago with sciatica, left side: Secondary | ICD-10-CM | POA: Diagnosis not present

## 2022-10-21 DIAGNOSIS — M25561 Pain in right knee: Secondary | ICD-10-CM | POA: Diagnosis not present

## 2022-10-21 DIAGNOSIS — M5441 Lumbago with sciatica, right side: Secondary | ICD-10-CM | POA: Diagnosis not present

## 2022-10-21 DIAGNOSIS — M25551 Pain in right hip: Secondary | ICD-10-CM | POA: Diagnosis not present

## 2022-10-21 DIAGNOSIS — M6281 Muscle weakness (generalized): Secondary | ICD-10-CM

## 2022-10-21 DIAGNOSIS — G8929 Other chronic pain: Secondary | ICD-10-CM

## 2022-10-21 NOTE — Therapy (Signed)
OUTPATIENT PHYSICAL THERAPY ONCOLOGY TREATMENT  Patient Name: Tonya Odonnell MRN: [id number] DOB:Jun 19, 1965, 57 y.o., female Today's Date: 10/21/2022  END OF SESSION:  PT End of Session - 10/21/22 1159     Visit Number 2    Number of Visits 12   approved visits   Date for PT Re-Evaluation 12/12/22    PT Start Time 1204    PT Stop Time 1250    PT Time Calculation (min) 46 min    Activity Tolerance Patient tolerated treatment well    Behavior During Therapy WFL for tasks assessed/performed             Past Medical History:  Diagnosis Date   Anemia    "USE TO HAVE IT,NOT NOW,2015,2016   Arthritis    HIPS,KNEES,HANDS   Breast cancer in female (HCC)    Cancer (HCC)    LEFT   Hyperlipidemia    Hypertension    Neuromuscular disorder (HCC)    MUSCLE PAIN,FIBROMYALGIA   Osteopenia    PVC (premature ventricular contraction)    Thyroid disease    Past Surgical History:  Procedure Laterality Date   Left Ovarian removal Left    MASTECTOMY Left    "LYMPHNODES REMOVED   Patient Active Problem List   Diagnosis Date Noted   Iron deficiency anemia 09/01/2022   Hemorrhoids 08/14/2022   Lichen sclerosus of female genitalia 12/25/2021   Adrenal adenoma, left 09/17/2021   Pain management contract signed 06/13/2021   Chronic pain syndrome 06/03/2021   Chronic arthralgias of knees and hips 05/06/2021   Osteopenia of lumbar spine 05/05/2021   Obesity 05/05/2021   Anxiety 12/05/2020   Gallstones 12/05/2020   Gastroesophageal reflux disease without esophagitis 12/05/2020   Prediabetes 12/05/2020   Hyperlipidemia 02/08/2018   Hepatitis B core antibody positive 08/07/2017   History of breast cancer 12/13/2014   Chronic low back pain 02/17/2014   Fibromyalgia 10/26/2013   Adverse reaction to influenza vaccine 11/22/2012   Essential (primary) hypertension 05/05/2012   Hashimoto's thyroiditis 05/05/2012      REFERRING PROVIDER: Aura Dials, NP  REFERRING DIAG: Chronic  LBP, Fibomyalgia, hip and knee pain  THERAPY DIAG:  Personal history of breast cancer  Muscle weakness (generalized)  Other low back pain  Pain in left hip  Chronic pain of both knees  Fibromyalgia syndrome  Chronic pain disorder  ONSET DATE: 2016 with worsening of symptoms  Rationale for Evaluation and Treatment: Rehabilitation  SUBJECTIVE:  SUBJECTIVE STATEMENT:  Odonnell Tonya was a bad day because the rain was coming today. I couldn't go to sleep. I am better today after the rain started.  EVAL Pt requires an MRI of her spine and hip due to progression of pain and concerns that her cancer may have returned. For insurance to cover her MRI she must do 6 weeks of therapy. I have pain all over my bones and joints that happened after chemo. I think I have lymphedema all over.  After the feeling of flush of water in 2016 I started hurting. Bilateral LBP/sacral pain is painful with bending, but I hurt all over in my skin and bones. It is a generalized pain overall. After she does cleaning or weeding it takes her a week to get over it.The Dr.  gave me steroids in February  and July and my skin felt better and I lost a lot of fluid. She has poor sleep, difficulty walking, bending to put on shoes, going up and down stairs. She has to lean on the cart to shop.   PERTINENT HISTORY:  Pt is s/p left mastectomy in 2016 with chemotherapy and hormonal therapy which was discontinued in 2022.Tonya Odonnell She was diagnosed with FMS a number of years ago. She complains of  diffuse body aches and retention of fluid. After she finished chemo in 2016  she was on Nortriptyline. When she stopped it she felt a flush of water in her body and since then she has pain in muscles and skin. When she exercises the next day she feels like she can't  move. She has tried several different anti depressant medications. Sometimes ibuprofen helps as long as she only takes it 1x/day. She can't sit in chairs more than 20 minutes because it bothers her back. She has been trying to find out for 7 years what is going on. She has arthritis and muscle pain all together. She has a sharp knife like pain in her LB .She was given steroids and muscle relaxer. The back pain started to resolve and she was urinating 5-6 times at night.and she started feeling so much better. She was getting extremely tired and just couldn't do much.  She is no longer on the meds and the pain is slowly starting to come back. She was diagnosed with Fibromyalgia after a swine flu vaccine. She got the vaccine on the left side and her left side of the body felt very different from the right. It got worse in 2015. After that they found the cancer in her LN's. She has swelling in the left UE, and had treatment in [place] with MLD and she has a compression sleeve but she doesn't wear it. She is not interested in being treated for lymphedema as she was treated in [place].Tonya Odonnell     PAIN:  Are you having pain? Yes NPRS scale: 7-8/10 Pain location: all over Pain orientation: Bilateral  PAIN TYPE: feel heavy like I am holding a lot of weight,fatigue Pain description: constant  Aggravating factors: its always there but worse with cleaning, cooking, up stairs, pulling weeds Relieving factors: better in the am, worse in evening  PRECAUTIONS: Left UE lymphedema, FMS, OA , poor vision   WEIGHT BEARING RESTRICTIONS: No  FALLS:  Has patient fallen in last 6 months? 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 Improved since steroids in July  OBSERVATIONS / OTHER ASSESSMENTS: uses hands for rising from chair, wearing slippers because shoes hurt her skin  SENSATION: Light touch: Deficits     POSTURE:  forward head, rounded shoulders   UPPER EXTREMITY AROM/PROM:  A/PROM RIGHT   eval   Shoulder extension   Shoulder flexion   Shoulder abduction   Shoulder internal rotation   Shoulder external rotation     (Blank rows = not tested)  A/PROM LEFT   eval  Shoulder extension   Shoulder flexion   Shoulder abduction   Shoulder internal rotation   Shoulder external rotation     (Blank rows = not tested)   LUMBAR:  FB   hands to mid lower leg  BB Decreased 70%, pain bilateral LBP Right SB; to knee: pain bilateral LB, then resolves, now I feel dizzy Left SB to knee: no pain     LOWER EXTREMITY AROM/PROM:  A/PROM Right eval  Hip flexion 3  Hip extension 1 bridge very fatigued  Hip abduction   Hip adduction   Hip internal rotation 4  Hip external rotation 4  Knee flexion 4  Knee extension 4  Ankle dorsiflexion 4+  Ankle plantarflexion 4  Ankle inversion   Ankle eversion    (Blank rows = not tested)  A/PROM LEFT eval  Hip flexion 3-  Hip extension 1 bridge very fatigued  Hip abduction 4  Hip adduction 4  Hip internal rotation 3+  Hip external rotation 3+  Knee flexion 4  Knee extension 4  Ankle dorsiflexion 4+  Ankle plantarflexion 4  Ankle inversion   Ankle eversion    (Blank rows = not tested)  LYMPHEDEMA ASSESSMENTS: Pt. Said she has a compression sleeve but doesn't wear it. She does not want to be treated for lymphedema.   SURGERY TYPE/DATE: Left Lumpectomy 12/2014 for Gr. 2 IDC, Mastectomy  01/2015 due to close margins  NUMBER OF LYMPH NODES REMOVED: 2/10  CHEMOTHERAPY: YES  RADIATION:NO  HORMONE TREATMENT: YES  INFECTIONS: NO  LYMPHEDEMA ASSESSMENTS: Pt has a compression sleeve but does not wear. She doesn't want to treat her lymphedema  LANDMARK RIGHT  eval  10 cm proximal to olecranon process   Olecranon process   10 cm proximal to ulnar styloid process   Just proximal to ulnar styloid process   Across hand at thumb web space   At base of 2nd digit   (Blank rows = not tested)  LANDMARK LEFT  eval  10 cm proximal to olecranon process   Olecranon process   10 cm proximal to ulnar styloid process   Just proximal to ulnar styloid process   Across hand at thumb web space   At base of 2nd digit   (Blank rows = not tested)   FUNCTIONAL TESTS:  5x sit to stand  22.65 seconds  GAIT: Distance walked: front desk to room 8 Assistive device utilized: None Level of assistance: Complete Independence  LOWER Extremity Functional Scale; 13/80   TODAY'S TREATMENT:  DATE:  10/21/2022 Nu Step seat 6, UE 9, Lev 2 x 5 min to warm up, 273 steps Meeks decompression exercises x 5 ea with 3 second hold Ppt x 10,  ppt x 10 with ball squeeze  LTR x 4 B, SKTC x 3 B FABER stretch B x 3  Seated Calf stretch with strap x 3 ea Updated HEP with Meeks Decompression  10/17/2022 Discussed POC and different treatments Ie aquatics. Pt is interested in trying aquatics  PATIENT EDUCATION:  Education details: POC, length of stay, Cancellation prn done day before Person educated: Patient Education method: Explanation Education comprehension: verbalized understanding  HOME EXERCISE PROGRAM: NA  ASSESSMENT:  CLINICAL IMPRESSION:  Pt warmed up on Nu Step x 5 min and then performed Meeks Decompression and some gentle stretches and strengthening  exs. HEP was updated. She felt very good after exercising today. Started slowly due to FMS/chronic pain  OBJECTIVE IMPAIRMENTS: . decreased activity tolerance, decreased endurance, decreased knowledge of condition, difficulty walking, decreased ROM, decreased strength, increased edema, impaired UE functional use, impaired vision/preception, postural dysfunction, and pain.   ACTIVITY LIMITATIONS: carrying, lifting, bending, sitting, standing, sleeping, stairs, and reach over head   PARTICIPATION LIMITATIONS:  cleaning, community activity, and yard work   PERSONAL FACTORS: Behavior pattern, Time since onset of injury/illness/exacerbation, and 3+ comorbidities: Left breast cancer s/p chemo  are also affecting patient's functional outcome.   REHAB POTENTIAL: Fair    CLINICAL DECISION MAKING: Stable/uncomplicated  EVALUATION COMPLEXITY: Low  GOALS: Goals reviewed with patient? Yes  SHORT TERM GOALS=LONG TERM GOALS: Target date: 6 weeks  Pt will be independent  and a tolerant of a HEP for gentle ROM/stretching Baseline: Goal status: INITIAL  2.  Pt will have improved strength of Bilateral LE hip flexion, IR and ER to at least 4/5 for improved safety  Baseline:  Goal status: INITIAL  3.  Pt will be able to shop for 25-50% longer periods of time without leaning over the shopping cart Baseline:  Goal status: INITIAL  4.  Pt will be tolerant of low level strengthening to improve core,and LE endurance and strength Baseline:  Goal status: INITIAL  5.  LEFS will be increased to 35/80  demonstrate improved function.  Baseline:13/80  Goal status:INITIAL  6   Pt will be able to perform 5 bridges with 3 second hold to improve strength for bed mobility  Goal Status:  INITIAL  7.  Pt will perform 5 sit to stands in 15 seconds to demonstrate improved strength /function  Baseline:2.6 seconds  Goal Status:INITIAL   PLAN:  PT FREQUENCY: 2x/week  PT DURATION: 8 weeks(only approved for 12  visits)  PLANNED INTERVENTIONS: Therapeutic exercises, Therapeutic activity, Neuromuscular re-education, Gait training, Patient/Family education, Self Care, Joint mobilization, Orthotic/Fit training, Dry Needling, Manual therapy, and Re-evaluation, aquatic therapy  PLAN FOR NEXT SESSION:FMS/chronic pain: Meeks decompression,sit to stand, Faber stretch, knee to chest, ppt with marching, lumbar stabs, Nu Step; start slowly and progress,   Waynette Buttery, PT 10/21/2022, 12:55 PM

## 2022-10-21 NOTE — Therapy (Unsigned)
OUTPATIENT PHYSICAL THERAPY ONCOLOGY TREATMENT  Patient Name: Tonya Odonnell MRN: [id number] DOB:[birth year], 57 y.o., female Today's Date: 10/22/2022  END OF SESSION:  PT End of Session - 10/22/22 1002     Visit Number 3    Number of Visits 12    Date for PT Re-Evaluation 12/12/22    PT Start Time 1003    PT Stop Time 1045    PT Time Calculation (min) 42 min    Activity Tolerance Patient tolerated treatment well    Behavior During Therapy WFL for tasks assessed/performed              Past Medical History:  Diagnosis Date   Anemia    "USE TO HAVE IT,NOT NOW,2015,2016   Arthritis    HIPS,KNEES,HANDS   Breast cancer in female (HCC)    Cancer (HCC)    LEFT   Hyperlipidemia    Hypertension    Neuromuscular disorder (HCC)    MUSCLE PAIN,FIBROMYALGIA   Osteopenia    PVC (premature ventricular contraction)    Thyroid disease    Past Surgical History:  Procedure Laterality Date   Left Ovarian removal Left    MASTECTOMY Left    "LYMPHNODES REMOVED   Patient Active Problem List   Diagnosis Date Noted   Iron deficiency anemia 09/01/2022   Hemorrhoids 08/14/2022   Lichen sclerosus of female genitalia 12/25/2021   Adrenal adenoma, left 09/17/2021   Pain management contract signed 06/13/2021   Chronic pain syndrome 06/03/2021   Chronic arthralgias of knees and hips 05/06/2021   Osteopenia of lumbar spine 05/05/2021   Obesity 05/05/2021   Anxiety 12/05/2020   Gallstones 12/05/2020   Gastroesophageal reflux disease without esophagitis 12/05/2020   Prediabetes 12/05/2020   Hyperlipidemia 02/08/2018   Hepatitis B core antibody positive 08/07/2017   History of breast cancer 12/13/2014   Chronic low back pain 02/17/2014   Fibromyalgia 10/26/2013   Adverse reaction to influenza vaccine 11/22/2012   Essential (primary) hypertension 05/05/2012   Hashimoto's thyroiditis 05/05/2012      REFERRING PROVIDER: Aura Dials, NP  REFERRING DIAG: Chronic LBP, Fibomyalgia,  hip and knee pain  THERAPY DIAG:  Personal history of breast cancer  Muscle weakness (generalized)  Other low back pain  Pain in left hip  Chronic pain of both knees  Fibromyalgia syndrome  Chronic pain disorder  Stiffness of right shoulder, not elsewhere classified  Stiffness of left shoulder, not elsewhere classified  Abnormal posture  ONSET DATE: 2016 with worsening of symptoms  Rationale for Evaluation and Treatment: Rehabilitation  SUBJECTIVE:  SUBJECTIVE STATEMENT: Generalized muscle and bone pain per pt report, her "normal."    PERTINENT HISTORY:  Pt is s/p left mastectomy in 2016 with chemotherapy and hormonal therapy which was discontinued in 2022.[name] She was diagnosed with FMS a number of years ago. She complains of  diffuse body aches and retention of fluid. After she finished chemo in 2016  she was on Nortriptyline. When she stopped it she felt a flush of water in her body and since then she has pain in muscles and skin. When she exercises the next day she feels like she can't move. She has tried several different anti depressant medications. Sometimes ibuprofen helps as long as she only takes it 1x/day. She can't sit in chairs more than 20 minutes because it bothers her back. She has been trying to find out for 7 years what is going on. She has arthritis and muscle pain all together. She has a sharp knife like pain in her LB .She was given steroids and muscle relaxer. The back pain started to resolve and she was urinating 5-6 times at night.and she started feeling so much better. She was getting extremely tired and just couldn't do much.  She is no longer on the meds and the pain is slowly starting to come back. She was diagnosed with Fibromyalgia after a swine flu vaccine. She got the  vaccine on the left side and her left side of the body felt very different from the right. It got worse in 2015. After that they found the cancer in her LN's. She has swelling in the left UE, and had treatment in [place] with MLD and she has a compression sleeve but she doesn't wear it. She is not interested in being treated for lymphedema as she was treated in [place].[name]     PAIN:  Are you having pain? Yes NPRS scale: 6-7/10 Pain location: all over Pain orientation: Bilateral  PAIN TYPE: feel heavy like I am holding a lot of weight,fatigue Pain description: constant  Aggravating factors: its always there but worse with cleaning, cooking, up stairs, pulling weeds Relieving factors: better in the am, worse in evening  PRECAUTIONS: Left UE lymphedema, FMS, OA , poor vision   WEIGHT BEARING RESTRICTIONS: No  FALLS:  Has patient fallen in last 6 months? No  LIVING ENVIRONMENT: Lives with: lives with their son Lives in: House/apartment Has following equipment at home: Single point cane and Environmental consultant - 2 wheeled  OCCUPATION: Does not work  LEISURE: TV, Dealer  HAND DOMINANCE: right   PRIOR LEVEL OF FUNCTION: Independent  PATIENT GOALS: Decrease pain, greater ability to be active, clean home    OBJECTIVE:  COGNITION: Overall cognitive status: Within functional limits for tasks assessed   PALPATION: no tenderness with moderate palpation to UT, upper and LB, UE Fibromyalgia points; mainly hurts now with using her muscle massager. Improved since steroids in July  OBSERVATIONS / OTHER ASSESSMENTS: uses hands for rising from chair, wearing slippers because shoes hurt her skin  SENSATION: Light touch: Deficits     POSTURE:  forward head, rounded shoulders   UPPER EXTREMITY AROM/PROM:  A/PROM RIGHT   eval   Shoulder extension   Shoulder flexion   Shoulder abduction   Shoulder internal rotation   Shoulder external rotation     (Blank rows = not tested)  A/PROM  LEFT   eval  Shoulder extension   Shoulder flexion   Shoulder abduction   Shoulder internal rotation   Shoulder external rotation     (  Blank rows = not tested)   LUMBAR:  FB   hands to mid lower leg  BB Decreased 70%, pain bilateral LBP Right SB; to knee: pain bilateral LB, then resolves, now I feel dizzy Left SB to knee: no pain     LOWER EXTREMITY AROM/PROM:  A/PROM Right eval  Hip flexion 3  Hip extension 1 bridge very fatigued  Hip abduction   Hip adduction   Hip internal rotation 4  Hip external rotation 4  Knee flexion 4  Knee extension 4  Ankle dorsiflexion 4+  Ankle plantarflexion 4  Ankle inversion   Ankle eversion    (Blank rows = not tested)  A/PROM LEFT eval  Hip flexion 3-  Hip extension 1 bridge very fatigued  Hip abduction 4  Hip adduction 4  Hip internal rotation 3+  Hip external rotation 3+  Knee flexion 4  Knee extension 4  Ankle dorsiflexion 4+  Ankle plantarflexion 4  Ankle inversion   Ankle eversion    (Blank rows = not tested)  LYMPHEDEMA ASSESSMENTS: Pt. Said she has a compression sleeve but doesn't wear it. She does not want to be treated for lymphedema.   SURGERY TYPE/DATE: Left Lumpectomy 12/2014 for Gr. 2 IDC, Mastectomy 01/2015 due to close margins  NUMBER OF LYMPH NODES REMOVED: 2/10  CHEMOTHERAPY: YES  RADIATION:NO  HORMONE TREATMENT: YES  INFECTIONS: NO  LYMPHEDEMA ASSESSMENTS: Pt has a compression sleeve but does not wear. She doesn't want to treat her lymphedema  LANDMARK RIGHT  eval  10 cm proximal to olecranon process   Olecranon process   10 cm proximal to ulnar styloid process   Just proximal to ulnar styloid process   Across hand at thumb web space   At base of 2nd digit   (Blank rows = not tested)  LANDMARK LEFT  eval  10 cm proximal to olecranon process   Olecranon process   10 cm proximal to ulnar styloid process   Just proximal to ulnar styloid process   Across hand at thumb web space   At  base of 2nd digit   (Blank rows = not tested)   FUNCTIONAL TESTS:  5x sit to stand  22.65 seconds  GAIT: Distance walked: front desk to room 8 Assistive device utilized: None Level of assistance: Complete Independence  LOWER Extremity Functional Scale; 13/80   TODAY'S TREATMENT:                                                                                                                                         DATE:   10/22/22:Pt arrives for aquatic physical therapy. Treatment took place in 3.5-5.5 feet of water. Water temperature was 90 degrees F. Pt entered the pool via stairs with mild use of rails. Pt requires buoyancy of water for support and to offload joints with strengthening exercises.  Seated water bench with 75% submersion Pt performed  seated LE AROM exercises 20x in all planes, with concurrent discussion of current status. PTA educated pt in water principles but pt was already aware. She has been a swimmer in the past. 75% depth water walking with small noodle 4x each direction monitoring for excessive fatigue. No rest breaks needed. Semi sit against wall with single buoy wts core/lat press 2x6 with Pilates breathing. Single knee to chest in same position 2x 3 breaths. High knee marching holding small noodle 4 lengths. Bil hamstring stretching at stairs 2x 3 breaths followed by standing childs pose 3x 3 breaths. Pt did breast stroke 2 lenghts at end of session.   10/21/2022 Nu Step seat 6, UE 9, Lev 2 x 5 min to warm up, 273 steps Meeks decompression exercises x 5 ea with 3 second hold Ppt x 10,  ppt x 10 with ball squeeze  LTR x 4 B, SKTC x 3 B FABER stretch B x 3  Seated Calf stretch with strap x 3 ea Updated HEP with Meeks Decompression  10/17/2022 Discussed POC and different treatments Ie aquatics. Pt is interested in trying aquatics  PATIENT EDUCATION:  Education details: POC, length of stay, Cancellation prn done day before Person educated: Patient Education  method: Explanation Education comprehension: verbalized understanding  HOME EXERCISE PROGRAM: NA  ASSESSMENT:  CLINICAL IMPRESSION:Pt presents for initial aquatic treatment with what she refers to as her normal aches and pains. Pt loves the water and felt energized moving and exercising today. No pain increases. Pt was so motivated by the end of her session she wanted to try breast stroke swim 2 lengths.   OBJECTIVE IMPAIRMENTS: . decreased activity tolerance, decreased endurance, decreased knowledge of condition, difficulty walking, decreased ROM, decreased strength, increased edema, impaired UE functional use, impaired vision/preception, postural dysfunction, and pain.   ACTIVITY LIMITATIONS: carrying, lifting, bending, sitting, standing, sleeping, stairs, and reach over head   PARTICIPATION LIMITATIONS:  cleaning, community activity, and yard work   PERSONAL FACTORS: Behavior pattern, Time since onset of injury/illness/exacerbation, and 3+ comorbidities: Left breast cancer s/p chemo  are also affecting patient's functional outcome.   REHAB POTENTIAL: Fair    CLINICAL DECISION MAKING: Stable/uncomplicated  EVALUATION COMPLEXITY: Low  GOALS: Goals reviewed with patient? Yes  SHORT TERM GOALS=LONG TERM GOALS: Target date: 6 weeks  Pt will be independent  and a tolerant of a HEP for gentle ROM/stretching Baseline: Goal status: INITIAL  2.  Pt will have improved strength of Bilateral LE hip flexion, IR and ER to at least 4/5 for improved safety  Baseline:  Goal status: INITIAL  3.  Pt will be able to shop for 25-50% longer periods of time without leaning over the shopping cart Baseline:  Goal status: INITIAL  4.  Pt will be tolerant of low level strengthening to improve core,and LE endurance and strength Baseline:  Goal status: INITIAL  5.  LEFS will be increased to 35/80  demonstrate improved function.  Baseline:13/80  Goal status:INITIAL  6   Pt will be able to  perform 5 bridges with 3 second hold to improve strength for bed mobility  Goal Status:  INITIAL  7.  Pt will perform 5 sit to stands in 15 seconds to demonstrate improved strength /function  Baseline:2.6 seconds  Goal Status:INITIAL   PLAN:  PT FREQUENCY: 2x/week  PT DURATION: 8 weeks(only approved for 12 visits)  PLANNED INTERVENTIONS: Therapeutic exercises, Therapeutic activity, Neuromuscular re-education, Gait training, Patient/Family education, Self Care, Joint mobilization, Orthotic/Fit training, Dry Needling, Manual therapy, and  Re-evaluation, aquatic therapy  PLAN FOR NEXT SESSION:Assess how pt tolerated the pool  [facility], PTA 10/22/2022, 4:33 PM

## 2022-10-21 NOTE — Patient Instructions (Signed)
1. Decompression Exercise     Cancer Rehab [phone]    Lie on back on firm surface, knees bent, feet flat, arms turned up, out to sides, backs of hands down. Time _5-15__ minutes. Surface: floor   2. Shoulder Press    Start in Decompression Exercise position. Press shoulders downward towards supporting surface. Hold __2-3__ seconds while counting out loud. Repeat _3-5___ times. Do _1-2___ times per day.   3. Head Press    Bring cervical spine (neck) into neutral position (by either tucking the chin towards the chest or tilting the chin upward). Feel weight on back of head. Press head downward into supporting surface.    Hold _2-3__ seconds. Repeat _3-5__ times. Do _1-2__ times per day.   4. Leg Lengthener    Straighten one leg. Pull toes AND forefoot toward knee, extend heel. Lengthen leg by pulling pelvis away from ribs. Hold _2-3__ seconds. Relax. Repeat __4-6__ times. Do other leg.  Surface: floor   5. Leg Press    Straighten one leg down to floor keeping leg aligned with hip. Pull toes AND forefoot toward knee; extend heel.  Press entire leg downward (as if pressing leg into sandy beach). DO NOT BEND KNEE. Hold _2-3__ seconds. Do __4-6__ times. Repeat with other leg.

## 2022-10-22 ENCOUNTER — Encounter: Payer: Self-pay | Admitting: Physical Therapy

## 2022-10-22 ENCOUNTER — Ambulatory Visit: Payer: Medicaid Other | Admitting: Physical Therapy

## 2022-10-22 DIAGNOSIS — G894 Chronic pain syndrome: Secondary | ICD-10-CM

## 2022-10-22 DIAGNOSIS — M5441 Lumbago with sciatica, right side: Secondary | ICD-10-CM | POA: Diagnosis not present

## 2022-10-22 DIAGNOSIS — M797 Fibromyalgia: Secondary | ICD-10-CM

## 2022-10-22 DIAGNOSIS — M25561 Pain in right knee: Secondary | ICD-10-CM | POA: Diagnosis not present

## 2022-10-22 DIAGNOSIS — M25552 Pain in left hip: Secondary | ICD-10-CM | POA: Diagnosis not present

## 2022-10-22 DIAGNOSIS — M25612 Stiffness of left shoulder, not elsewhere classified: Secondary | ICD-10-CM

## 2022-10-22 DIAGNOSIS — Z853 Personal history of malignant neoplasm of breast: Secondary | ICD-10-CM

## 2022-10-22 DIAGNOSIS — M25611 Stiffness of right shoulder, not elsewhere classified: Secondary | ICD-10-CM

## 2022-10-22 DIAGNOSIS — M25551 Pain in right hip: Secondary | ICD-10-CM | POA: Diagnosis not present

## 2022-10-22 DIAGNOSIS — G8929 Other chronic pain: Secondary | ICD-10-CM

## 2022-10-22 DIAGNOSIS — R293 Abnormal posture: Secondary | ICD-10-CM

## 2022-10-22 DIAGNOSIS — M5459 Other low back pain: Secondary | ICD-10-CM

## 2022-10-22 DIAGNOSIS — M5442 Lumbago with sciatica, left side: Secondary | ICD-10-CM | POA: Diagnosis not present

## 2022-10-22 DIAGNOSIS — M25562 Pain in left knee: Secondary | ICD-10-CM | POA: Diagnosis not present

## 2022-10-22 DIAGNOSIS — M6281 Muscle weakness (generalized): Secondary | ICD-10-CM

## 2022-10-26 NOTE — Patient Instructions (Signed)
Hip Pain The hip is the joint between the upper legs and the lower pelvis. The bones, cartilage, tendons, and muscles of your hip joint support your body and allow you to move around. Hip pain can range from a minor ache to severe pain in one or both of your hips. The pain may be felt on the inside of the hip joint near the groin, or on the outside near the buttocks and upper thigh. You may also have swelling or stiffness in your hip area. Follow these instructions at home: Managing pain, stiffness, and swelling     If told, put ice on the painful area. Put ice in a plastic bag. Place a towel between your skin and the bag. Leave the ice on for 20 minutes, 2-3 times a day. If told, apply heat to the affected area as often as told by your health care provider. Use the heat source that your provider recommends, such as a moist heat pack or a heating pad. Place a towel between your skin and the heat source. Leave the heat on for 20-30 minutes. If your skin turns bright red, remove the ice or heat right away to prevent skin damage. The risk of damage is higher if you cannot feel pain, heat, or cold. Activity Do exercises as told by your provider. Avoid activities that cause pain. General instructions  Take over-the-counter and prescription medicines only as told by your provider. Keep a journal of your symptoms. Write down: How often you have hip pain. The location of your pain. What the pain feels like. What makes the pain worse. Sleep with a pillow between your legs on your most comfortable side. Keep all follow-up visits. Your provider will monitor your pain and activity. Contact a health care provider if: You cannot put weight on your leg. Your pain or swelling gets worse after a week. It gets harder to walk. You have a fever. Get help right away if: You fall. You have a sudden increase in pain and swelling in your hip. Your hip is red or swollen or very tender to touch. This  information is not intended to replace advice given to you by your health care provider. Make sure you discuss any questions you have with your health care provider. Document Revised: 10/29/2021 Document Reviewed: 10/29/2021 Elsevier Patient Education  2024 [employer] Inc.  

## 2022-10-27 ENCOUNTER — Other Ambulatory Visit: Payer: Medicaid Other

## 2022-10-27 ENCOUNTER — Encounter: Payer: Self-pay | Admitting: Nurse Practitioner

## 2022-10-27 ENCOUNTER — Ambulatory Visit: Payer: Medicaid Other | Admitting: Nurse Practitioner

## 2022-10-27 DIAGNOSIS — R195 Other fecal abnormalities: Secondary | ICD-10-CM

## 2022-10-27 DIAGNOSIS — D509 Iron deficiency anemia, unspecified: Secondary | ICD-10-CM | POA: Diagnosis not present

## 2022-10-27 NOTE — Progress Notes (Signed)
Primary GI: Imogene Burn, MD   ASSESSMENT & PLAN   57 yo female with a history of breast cancer, fibromyalgia, hypothyroidism, adenomatous colon polyps, diverticulosis, hypertension, uterine fibroids  Here for follow up on rectal pain /  external hemorrhoid / ? posterior midline fissure. Symptoms resolved after 6-8 weeks of Diltiazem.   Loose stool, new.  She thinks loose stool is 2/2 to iron but explained that iron usually causes constipation. Loose stools may be related to recent increase in milk consumption. Also she has added some fruit to diet though doesn't sound like she is eating that much fruit.  --Hold dairy for a few weeks to see if loose stool improves. If no improvement then omit fruit ( one at a time for several days) to see if any particular fruit is causing the loose stools. Advised to avoid fruits with high fructose and polyols ( list provided to pateint)   History of tubular adenomatous colon polyps June 2023 -5-year interval colonoscopy recommended    Maybe iron deficiency (without anemia).  Labs drawn by Dermatology while evaluating for hair loss. Hgb is 16-17 ( tends to run on high side). TIBC and iron saturation normal ; ferritin borderline at 34.  --Dermatology has started oral iron.  --Had recent visit with Oncologist ( history of breast cancer) and apparently IV iron wasn't recommended.  No overt GI bleeding and heme negative on DRE today --Will obtain celiac serologies  Hypothyroidism.   Fibromyalgia / Chronic lower back pain. She relates much of her chronic pain to swine flu vaccine received in 1990's    HPI   Brief GI history  Kiarra was evaluated here 08/21/2022 for endorectal pain and perianal lesion which she was concerned could be cancer.  She had small thrombosed external hemorrhoid on exam.  An anal fissure was suspected based on the discomfort she had during the rectal exam though 1 was not visualized.  She was treated empirically with diltiazem  ointment.  She was started on daily Metamucil and given follow-up appointment  INTERVAL HISTORY    Chief complaint : Here for follow-up  Completed 6 weeks of Diltiazem ointment. Not having rectal pain now.  Not taking fiber because her stool have been loose stince starting oral iron .   She has been undergoing workup for hair loss by dermatologist  who obtained labs and found that her ferritin was on low side.  She was started on oral iron , B12 , and Vitamin C. Since starting theses supplements she has had some bowel changes. Having more frequent and loose BMs on some days but also eating more vegetables, drinking more milk and eating fruits. She took sugar out of her diet due to pre-diabetes. Not using artifical sweeteners.  She does drink numerous glasses of tea every day but has done so for years  Of note, she gives a history of anemia in 2014 requiring IV iron.  Malvin Johns Oncologist ( hx of breast cancer) in July and apparently told her she didn't need IV iron.    Previous GI Endoscopies / Labs / Imaging   **May not include all endoscopic evaluations   Colonoscopy June 2023 - The examined portion of the ileum was normal. - One 15 mm polyp in the transverse colon, removed with a hot snare. Resected and retrieved. Tattooed. - One 8 mm polyp in the transverse colon, removed with a hot snare. Resected and retrieved. - One 4 mm polyp in the transverse colon,  removed with a cold snare. Resected and retrieved. - Diverticulosis in the sigmoid colon. - Non-bleeding internal hemorrhoids  Surgical [P], colon, transverse x2 HAIR SHAFT, transverse x1 CS, polyp (3) FINDINGS CONSISTENT WITH TUBULAR ADENOMA AND INFLAMMATORY POLYP (LARGER ONE). NO HIGH-GRADE DYSPLASIA OR MALIGNANCY IS SEEN. CLINICAL AND ENDOSCOPIC CORRELATION IS REQUIRED       Latest Ref Rng & Units 09/17/2022    8:01 AM 07/08/2022   11:06 AM 01/16/2022   11:32 AM  Hepatic Function  Total Protein 6.5 - 8.1 g/dL 7.0  6.7  7.2    Albumin 3.5 - 5.0 g/dL 3.9  4.4  4.7   AST 15 - 41 U/L 16  21  18    ALT 0 - 44 U/L 26  25  19    Alk Phosphatase 38 - 126 U/L 67  88  88   Total Bilirubin 0.3 - 1.2 mg/dL 1.0  0.7  1.3        Latest Ref Rng & Units 09/17/2022    8:01 AM 08/14/2022    1:24 PM 07/08/2022   11:06 AM  CBC  WBC 4.0 - 10.5 K/uL 10.8  6.8  7.9   Hemoglobin 12.0 - 15.0 g/dL 08.6  57.8  46.9   Hematocrit 36.0 - 46.0 % 51.1  47.6  48.3   Platelets 150 - 400 K/uL 300  323  328      Past Medical History:  Diagnosis Date   Anemia    "USE TO HAVE IT,NOT NOW,2015,2016   Arthritis    HIPS,KNEES,HANDS   Breast cancer in female (HCC)    Cancer (HCC)    LEFT   Hyperlipidemia    Hypertension    Low iron    Neuromuscular disorder (HCC)    MUSCLE PAIN,FIBROMYALGIA   Osteopenia    PVC (premature ventricular contraction)    Thyroid disease     Past Surgical History:  Procedure Laterality Date   Left Ovarian removal Left    MASTECTOMY Left    "LYMPHNODES REMOVED    Family History  Problem Relation Age of Onset   Hypertension Mother    Leukemia Mother    Stomach cancer Father    Colon cancer Neg Hx    Colon polyps Neg Hx    Crohn's disease Neg Hx    Esophageal cancer Neg Hx    Rectal cancer Neg Hx     Current Medications, Allergies, Family History and Social History were reviewed in [employer] medical record.     Current Outpatient Medications  Medication Sig Dispense Refill   Iron Combinations (IRON COMPLEX PO) Take by mouth.     Azelaic Acid 15 % gel Apply to face every morning 50 g 3   cetirizine-pseudoephedrine (ZYRTEC-D) 5-120 MG tablet Take 1 tablet by mouth 2 (two) times daily. 60 tablet 4   Cholecalciferol 25 MCG (1000 UT) tablet Take 1 tablet (1,000 Units total) by mouth daily. 90 tablet 4   clobetasol cream (TEMOVATE) 0.05 % Apply 1 application topically 2 (two) times daily. Apply to affected area 30 g 2   cromolyn (OPTICROM) 4 % ophthalmic solution Place 1 drop into  both eyes 4 (four) times daily. 10 mL 4   diclofenac Sodium (VOLTAREN) 1 % GEL Apply topically as needed.     diltiazem 2 % GEL Apply 1 Application topically 3 (three) times daily. Inside the rectum as directed. 30 g 0   ELIDEL 1 % cream Apply to red scaly areas on the face  BID for seb derm 30 g 4   fluticasone (FLONASE) 50 MCG/ACT nasal spray Place 2 sprays into both nostrils daily. (Patient taking differently: Place 2 sprays into both nostrils as needed.) 16 g 2   hydrochlorothiazide (HYDRODIURIL) 12.5 MG tablet Take 12.5 mg by mouth daily.     ibuprofen (ADVIL) 200 MG tablet Take 1 tablet (200 mg total) by mouth every 6 (six) hours as needed. 30 tablet 4   Ivermectin (SOOLANTRA) 1 % CREA Apply 1 Application topically at bedtime. 45 g 2   levothyroxine (SYNTHROID) 125 MCG tablet Take 1 tablet (125 mcg total) by mouth daily. 90 tablet 3   metoprolol succinate (TOPROL-XL) 50 MG 24 hr tablet Take 1 tablet (50 mg total) by mouth in the morning and at bedtime. 180 tablet 4   minoxidil (LONITEN) 2.5 MG tablet Take 1 tablet (2.5 mg total) by mouth daily. 90 tablet 1   olopatadine (PATANOL) 0.1 % ophthalmic solution 1 drop 2 (two) times daily.     omeprazole (PRILOSEC) 40 MG capsule Take 1 capsule (40 mg total) by mouth daily. 30 capsule 12   valACYclovir (VALTREX) 1000 MG tablet Take 1,000 mg by mouth daily.     No current facility-administered medications for this visit.    Review of Systems: No chest pain. No shortness of breath. No urinary complaints.    Physical Exam  Wt Readings from Last 3 Encounters:  10/27/22 191 lb (86.6 kg)  09/17/22 184 lb 14.4 oz (83.9 kg)  09/01/22 185 lb 6.4 oz (84.1 kg)    BP 138/88   Pulse 88   Ht 5\' 3"  (1.6 m)   Wt 191 lb (86.6 kg)   LMP  (LMP Unknown)   BMI 33.83 kg/m  Constitutional:  Pleasant, generally well appearing female in no acute distress. Psychiatric: Normal mood and affect. Behavior is normal. EENT: Pupils normal.  Conjunctivae are  normal. No scleral icterus. Neck supple.  Cardiovascular: Normal rate, regular rhythm.  Pulmonary/chest: Effort normal and breath sounds normal. No wheezing, rales or rhonchi. Abdominal: Soft, nondistended, nontender. Bowel sounds active throughout. There are no masses palpable. No hepatomegaly. Neurological: Alert and oriented to person place and time.    Willette Cluster, NP  10/27/2022, 2:08 PM

## 2022-10-27 NOTE — Patient Instructions (Addendum)
_______________________________________________________  If your blood pressure at your visit was 140/90 or greater, please contact your primary care physician to follow up on this.  If you are age 57 or younger, your body mass index should be between 19-25. Your Body mass index is 33.83 kg/m. If this is out of the aformentioned range listed, please consider follow up with your Primary Care Provider.  ________________________________________________________  The [place] [facility] providers would like to encourage you to use [facility] to communicate with providers for non-urgent requests or questions.  Due to long hold times on the telephone, sending your provider a message by [facility] may be a faster and more efficient way to get a response.  Please allow 48 business hours for a response.  Please remember that this is for non-urgent requests.  _______________________________________________________  Your provider has requested that you go to the basement level for lab work before leaving today. Press "B" on the elevator. The lab is located at the first door on the left as you exit the elevator.  Due to recent changes in healthcare laws, you may see the results of your imaging and laboratory studies on MyChart before your provider has had a chance to review them.  We understand that in some cases there may be results that are confusing or concerning to you. Not all laboratory results come back in the same time frame and the provider may be waiting for multiple results in order to interpret others.  Please give [place] 48 hours in order for your provider to thoroughly review all the results before contacting the office for clarification of your results.   Hold dairy for a few weeks and see if loose stool improves. If no improvement then omit fruit ( one at a time for several days) to see if any of the fruit is causing the loose stools.   We will let you know about celiac study results.   Thank you for entrusting  me with your care and choosing [facility].  Willette Cluster, NP

## 2022-10-28 LAB — IGA: Immunoglobulin A: 143 mg/dL (ref 47–310)

## 2022-10-28 LAB — TISSUE TRANSGLUTAMINASE, IGA: (tTG) Ab, IgA: <1 U/mL

## 2022-10-29 ENCOUNTER — Encounter: Payer: Self-pay | Admitting: Nurse Practitioner

## 2022-10-29 ENCOUNTER — Ambulatory Visit: Payer: Medicaid Other | Admitting: Nurse Practitioner

## 2022-10-29 VITALS — BP 132/80 | HR 82 | Temp 98.0°F | Ht 62.0 in | Wt 189.6 lb

## 2022-10-29 DIAGNOSIS — G894 Chronic pain syndrome: Secondary | ICD-10-CM

## 2022-10-29 DIAGNOSIS — E063 Autoimmune thyroiditis: Secondary | ICD-10-CM | POA: Diagnosis not present

## 2022-10-29 DIAGNOSIS — D508 Other iron deficiency anemias: Secondary | ICD-10-CM

## 2022-10-29 DIAGNOSIS — M797 Fibromyalgia: Secondary | ICD-10-CM | POA: Diagnosis not present

## 2022-10-29 NOTE — Assessment & Plan Note (Signed)
Chronic, ongoing.   Currently recommend she continue home regimen.  Has not tolerated multiple medications.  We can obtain MR imaging of lower back and left hip due to increased once she has completed 8 weeks of PT, which she is aware of.  Has taken multiple medications in past with adverse reactions or no benefit.

## 2022-10-29 NOTE — Assessment & Plan Note (Signed)
Diagnosed by dermatology and can not take oral supplement due to GI upset, tried multiple in past.  Overall her recent labs were reassuring, reviewed with her that H/H were actually a little elevated and iron and ferritin levels normal.  She is concerned about ferritin being on low side of normal, which discussed at length with her that current level is reassuring.  Would hold off on taking iron supplement at this time.  Recheck today.

## 2022-10-29 NOTE — Assessment & Plan Note (Signed)
Chronic, ongoing. Continue current medication regimen and adjust as needed based on labs.  TSH and Free T4 today, recent labs overall stable with exception of mild elevation in Free T4.

## 2022-10-29 NOTE — Progress Notes (Signed)
BP 132/80 (BP Location: Right Arm, Patient Position: Sitting)   Pulse 82   Temp 98 F (36.7 C) (Oral)   Ht 5\' 2"  (1.575 m)   Wt 189 lb 9.6 oz (86 kg)   LMP  (LMP Unknown)   SpO2 96%   BMI 34.68 kg/m    Subjective:    Patient ID: Tonya Odonnell, female    DOB: 07-Dec-1965, 57 y.o.   MRN: [id number]  HPI: Tonya Odonnell is a 57 y.o. female  Chief Complaint  Patient presents with   Pain    8 week f/up- patient states her pain has gotten better   CHRONIC PAIN/FIBROMYALGIA Follow-up today for hip pain, treated with Prednisone taper and sent to PT on 09/01/22.  Ordered MRI lower back, but has not obtained as her insurance requires her to perform 8 weeks of PT first.  She started PT on 10/17/22.  Has underlying fibromyalgia at baseline.  Saw oncologist 09/17/22 last.  Her recent mammogram on 07/30/20 was stable.  Had imaging of lower back and left hip XR on 03/05/22 and 03/24/22 - some degenerative changes noted to left hip.  She reports pain did improve with Prednisone taper, feeling much better.    Has tried Cymbalta, Lyrica, and Gabapentin without benefit.  Saw pain clinic 06/13/21 and was provided Tramadol, she reports no benefit from this visit, makes her feel bad.  Saw rheumatology last 04/10/22 with [facility], they recommended PT.    Follows with GI for iron deficient anemia -- is taking Slow Fe, but having reaction from this, diarrhea and face feels swollen.  She reports GI told her to stop milk, but if continues symptoms she reports may need to get iron infusions which she had in past.  She would like thyroid checked again as she feels tired and hair falling out.   Pain control status: stable at present Duration: chronic Location: lower back and left hip when present Quality: dull, aching, and throbbing Current Pain Level: 7/10 at baseline with fibro Previous Pain Level: 4/10 Breakthrough pain: no Benefit from narcotic medications: no What Activities task can be accomplished with current  medication? Simple tasks only Interested in weaning off narcotics: n/a    Stool softners/OTC fiber: yes  Previous pain specialty evaluation: yes Non-narcotic analgesic meds: yes -- Ibuprofen Narcotic contract: no   Relevant past medical, surgical, family and social history reviewed and updated as indicated. Interim medical history since our last visit reviewed. Allergies and medications reviewed and updated.  Review of Systems  Constitutional:  Positive for fatigue. Negative for activity change, appetite change, diaphoresis and fever.  Respiratory:  Negative for cough, chest tightness and shortness of breath.   Cardiovascular:  Negative for chest pain, palpitations and leg swelling.  Gastrointestinal: Negative.   Endocrine: Negative for cold intolerance, heat intolerance, polydipsia, polyphagia and polyuria.  Musculoskeletal:  Positive for arthralgias.  Neurological: Negative.   Psychiatric/Behavioral: Negative.      Per HPI unless specifically indicated above     Objective:    BP 132/80 (BP Location: Right Arm, Patient Position: Sitting)   Pulse 82   Temp 98 F (36.7 C) (Oral)   Ht 5\' 2"  (1.575 m)   Wt 189 lb 9.6 oz (86 kg)   LMP  (LMP Unknown)   SpO2 96%   BMI 34.68 kg/m   Wt Readings from Last 3 Encounters:  10/29/22 189 lb 9.6 oz (86 kg)  10/27/22 191 lb (86.6 kg)  09/17/22 184 lb 14.4 oz (83.9 kg)  Physical Exam Vitals and nursing note reviewed.  Constitutional:      General: She is awake. She is not in acute distress.    Appearance: She is well-developed and well-groomed. She is obese. She is not ill-appearing or toxic-appearing.  HENT:     Head: Normocephalic.     Right Ear: Hearing and external ear normal.     Left Ear: Hearing and external ear normal.  Eyes:     General: Lids are normal.        Right eye: No discharge.        Left eye: No discharge.     Conjunctiva/sclera: Conjunctivae normal.     Pupils: Pupils are equal, round, and reactive to  light.  Neck:     Thyroid: No thyromegaly.     Vascular: No carotid bruit.  Cardiovascular:     Rate and Rhythm: Normal rate and regular rhythm.     Heart sounds: Normal heart sounds. No murmur heard.    No gallop.  Pulmonary:     Effort: Pulmonary effort is normal. No accessory muscle usage or respiratory distress.     Breath sounds: Normal breath sounds.  Abdominal:     General: Bowel sounds are normal. There is no distension.     Palpations: Abdomen is soft.     Tenderness: There is no abdominal tenderness.  Musculoskeletal:     Cervical back: Normal range of motion and neck supple.     Right lower leg: No edema.     Left lower leg: No edema.  Lymphadenopathy:     Cervical: No cervical adenopathy.  Skin:    General: Skin is warm and dry.  Neurological:     Mental Status: She is alert and oriented to person, place, and time.     Deep Tendon Reflexes: Reflexes are normal and symmetric.     Reflex Scores:      Brachioradialis reflexes are 2+ on the right side and 2+ on the left side.      Patellar reflexes are 2+ on the right side and 2+ on the left side. Psychiatric:        Attention and Perception: Attention normal.        Mood and Affect: Mood normal.        Speech: Speech normal.        Behavior: Behavior normal. Behavior is cooperative.        Thought Content: Thought content normal.    Results for orders placed or performed in visit on 10/27/22  IgA  Result Value Ref Range   Immunoglobulin A 143 47 - 310 mg/dL  Tissue transglutaminase, IgA  Result Value Ref Range   (tTG) Ab, IgA <1.0 U/mL      Assessment & Plan:   Problem List Items Addressed This Visit       Endocrine   Hashimoto's thyroiditis    Chronic, ongoing. Continue current medication regimen and adjust as needed based on labs.  TSH and Free T4 today, recent labs overall stable with exception of mild elevation in Free T4.      Relevant Orders   T4, free   TSH     Other   Chronic pain syndrome  - Primary    Refer to fibromyalgia plan of care.      Fibromyalgia    Chronic, ongoing.   Currently recommend she continue home regimen.  Has not tolerated multiple medications.  We can obtain MR imaging of lower back and left hip  due to increased once she has completed 8 weeks of PT, which she is aware of.  Has taken multiple medications in past with adverse reactions or no benefit.      Iron deficiency anemia    Diagnosed by dermatology and can not take oral supplement due to GI upset, tried multiple in past.  Overall her recent labs were reassuring, reviewed with her that H/H were actually a little elevated and iron and ferritin levels normal.  She is concerned about ferritin being on low side of normal, which discussed at length with her that current level is reassuring.  Would hold off on taking iron supplement at this time.  Recheck today.      Relevant Medications   cyanocobalamin (VITAMIN B12) 1000 MCG tablet   Other Relevant Orders   Iron Binding Cap (TIBC)([facility])   Ferritin   CBC with Differential/Platelet      Follow up plan: Return in about 3 months (around 01/29/2023) for FIBROMYALGIA, CHRONIC PAIN, HTN/HLD.

## 2022-10-29 NOTE — Assessment & Plan Note (Signed)
Refer to fibromyalgia plan of care. 

## 2022-10-29 NOTE — Progress Notes (Signed)
I agree with the assessment and plan as outlined by Ms. Guenther. 

## 2022-10-30 ENCOUNTER — Encounter: Payer: Self-pay | Admitting: Nurse Practitioner

## 2022-10-30 ENCOUNTER — Ambulatory Visit: Payer: Medicaid Other

## 2022-10-30 LAB — FERRITIN: Ferritin: 89 ng/mL (ref 15–150)

## 2022-10-30 LAB — IRON AND TIBC
Iron Saturation: 23 % (ref 15–55)
Iron: 84 ug/dL (ref 27–159)
Total Iron Binding Capacity: 358 ug/dL (ref 250–450)
UIBC: 274 ug/dL (ref 131–425)

## 2022-10-30 LAB — CBC WITH DIFFERENTIAL/PLATELET
Basophils Absolute: 0 10*3/uL (ref 0.0–0.2)
Basos: 1 %
EOS (ABSOLUTE): 0.1 10*3/uL (ref 0.0–0.4)
Eos: 1 %
Hematocrit: 49.1 % — ABNORMAL HIGH (ref 34.0–46.6)
Hemoglobin: 16.1 g/dL — ABNORMAL HIGH (ref 11.1–15.9)
Immature Grans (Abs): 0.1 10*3/uL (ref 0.0–0.1)
Immature Granulocytes: 1 %
Lymphocytes Absolute: 2.5 10*3/uL (ref 0.7–3.1)
Lymphs: 36 %
MCH: 29 pg (ref 26.6–33.0)
MCHC: 32.8 g/dL (ref 31.5–35.7)
MCV: 89 fL (ref 79–97)
Monocytes Absolute: 0.5 10*3/uL (ref 0.1–0.9)
Monocytes: 8 %
Neutrophils Absolute: 3.8 10*3/uL (ref 1.4–7.0)
Neutrophils: 53 %
Platelets: 304 10*3/uL (ref 150–450)
RBC: 5.55 x10E6/uL — ABNORMAL HIGH (ref 3.77–5.28)
RDW: 14.1 % (ref 11.7–15.4)
WBC: 6.9 10*3/uL (ref 3.4–10.8)

## 2022-10-30 LAB — TSH: TSH: 1.58 u[IU]/mL (ref 0.450–4.500)

## 2022-10-30 LAB — T4, FREE: Free T4: 2.13 ng/dL — ABNORMAL HIGH (ref 0.82–1.77)

## 2022-10-30 NOTE — Progress Notes (Signed)
Contacted via MyChart   Good afternoon Tonya Odonnell, your labs have returned.  I definitely would stop iron supplement as hemoglobin and hematocrit remain elevated and iron level normal.  Ferritin also normal.  Your thyroid labs continue to show normal TSH, but Free T4 remains a little elevated.  Often if thyroid is more hyperactive the TSH will be lower , <0.4, and the Free T4 will be high.  We could try lowering your Levothyroxine dose to see if improvement in your symptoms.  Would you like to try this?  Then we would recheck levels in 6 weeks, let me know.:) Keep being amazing!!  Thank you for allowing me to participate in your care.  I appreciate you. Kindest regards, Dandrea Medders

## 2022-10-31 MED ORDER — LEVOTHYROXINE SODIUM 112 MCG PO TABS: 112.0000 ug | ORAL_TABLET | Freq: Every day | ORAL | 3 refills | Status: DC

## 2022-11-04 ENCOUNTER — Ambulatory Visit: Payer: Medicaid Other

## 2022-11-04 DIAGNOSIS — M5441 Lumbago with sciatica, right side: Secondary | ICD-10-CM | POA: Diagnosis not present

## 2022-11-04 DIAGNOSIS — M797 Fibromyalgia: Secondary | ICD-10-CM

## 2022-11-04 DIAGNOSIS — G894 Chronic pain syndrome: Secondary | ICD-10-CM | POA: Diagnosis not present

## 2022-11-04 DIAGNOSIS — M25551 Pain in right hip: Secondary | ICD-10-CM | POA: Diagnosis not present

## 2022-11-04 DIAGNOSIS — M25561 Pain in right knee: Secondary | ICD-10-CM | POA: Diagnosis not present

## 2022-11-04 DIAGNOSIS — M25562 Pain in left knee: Secondary | ICD-10-CM | POA: Diagnosis not present

## 2022-11-04 DIAGNOSIS — Z853 Personal history of malignant neoplasm of breast: Secondary | ICD-10-CM

## 2022-11-04 DIAGNOSIS — M6281 Muscle weakness (generalized): Secondary | ICD-10-CM

## 2022-11-04 DIAGNOSIS — G8929 Other chronic pain: Secondary | ICD-10-CM

## 2022-11-04 DIAGNOSIS — M25552 Pain in left hip: Secondary | ICD-10-CM

## 2022-11-04 DIAGNOSIS — M5442 Lumbago with sciatica, left side: Secondary | ICD-10-CM | POA: Diagnosis not present

## 2022-11-04 DIAGNOSIS — M5459 Other low back pain: Secondary | ICD-10-CM

## 2022-11-04 NOTE — Therapy (Signed)
OUTPATIENT PHYSICAL THERAPY ONCOLOGY TREATMENT  Patient Name: Tonya Odonnell MRN: [id number] DOB:[birth year], 57 y.o., female Today's Date: 11/04/2022  END OF SESSION:  PT End of Session - 11/04/22 1502     Visit Number 4    Number of Visits 12    Date for PT Re-Evaluation 12/12/22    PT Start Time 1504    PT Stop Time 1553    PT Time Calculation (min) 49 min    Activity Tolerance Patient tolerated treatment well    Behavior During Therapy WFL for tasks assessed/performed              Past Medical History:  Diagnosis Date   Anemia    "USE TO HAVE IT,NOT NOW,2015,2016   Arthritis    HIPS,KNEES,HANDS   Breast cancer in female (HCC)    Cancer (HCC)    LEFT   Hyperlipidemia    Hypertension    Low iron    Neuromuscular disorder (HCC)    MUSCLE PAIN,FIBROMYALGIA   Osteopenia    PVC (premature ventricular contraction)    Thyroid disease    Past Surgical History:  Procedure Laterality Date   Left Ovarian removal Left    MASTECTOMY Left    "LYMPHNODES REMOVED   Patient Active Problem List   Diagnosis Date Noted   Iron deficiency anemia 09/01/2022   Hemorrhoids 08/14/2022   Lichen sclerosus of female genitalia 12/25/2021   Adrenal adenoma, left 09/17/2021   Pain management contract signed 06/13/2021   Chronic pain syndrome 06/03/2021   Chronic arthralgias of knees and hips 05/06/2021   Osteopenia of lumbar spine 05/05/2021   Obesity 05/05/2021   Anxiety 12/05/2020   Gallstones 12/05/2020   Gastroesophageal reflux disease without esophagitis 12/05/2020   Prediabetes 12/05/2020   Hyperlipidemia 02/08/2018   Hepatitis B core antibody positive 08/07/2017   History of breast cancer 12/13/2014   Chronic low back pain 02/17/2014   Fibromyalgia 10/26/2013   Adverse reaction to influenza vaccine 11/22/2012   Essential (primary) hypertension 05/05/2012   Hashimoto's thyroiditis 05/05/2012      REFERRING PROVIDER: Aura Dials, NP  REFERRING DIAG: Chronic LBP,  Fibomyalgia, hip and knee pain  THERAPY DIAG:  Muscle weakness (generalized)  Other low back pain  Pain in left hip  Chronic pain of both knees  Chronic pain disorder  Personal history of breast cancer  Fibromyalgia syndrome  ONSET DATE: 2016 with worsening of symptoms  Rationale for Evaluation and Treatment: Rehabilitation  SUBJECTIVE:  SUBJECTIVE STATEMENT:  The pool was great. The facility was gorgeous. I was very tired after and spent the rest of the day in bed. The muscles between my shoulders were so painful. I can't  sit straight or walk straight so the pool is good for me. They have changed my thyroid medicine so I hope in a while I will feel better. I have to lay down all day whether I just walk the dog 10 min, or come to PT    PERTINENT HISTORY:  Pt is s/p left mastectomy in 2016 with chemotherapy and hormonal therapy which was discontinued in 2022.Tonya Odonnell She was diagnosed with FMS a number of years ago. She complains of  diffuse body aches and retention of fluid. After she finished chemo in 2016  she was on Nortriptyline. When she stopped it she felt a flush of water in her body and since then she has pain in muscles and skin. When she exercises the next day she feels like she can't move. She has tried several different anti depressant medications. Sometimes ibuprofen helps as long as she only takes it 1x/day. She can't sit in chairs more than 20 minutes because it bothers her back. She has been trying to find out for 7 years what is going on. She has arthritis and muscle pain all together. She has a sharp knife like pain in her LB .She was given steroids and muscle relaxer. The back pain started to resolve and she was urinating 5-6 times at night.and she started feeling so much better. She was  getting extremely tired and just couldn't do much.  She is no longer on the meds and the pain is slowly starting to come back. She was diagnosed with Fibromyalgia after a swine flu vaccine. She got the vaccine on the left side and her left side of the body felt very different from the right. It got worse in 2015. After that they found the cancer in her LN's. She has swelling in the left UE, and had treatment in [place] with MLD and she has a compression sleeve but she doesn't wear it. She is not interested in being treated for lymphedema as she was treated in [place].Tonya Odonnell     PAIN:  Are you having pain? Yes NPRS scale: 6-7/10 Pain location: all over Pain orientation: Bilateral  PAIN TYPE: feel heavy like I am holding a lot of weight,fatigue Pain description: constant  Aggravating factors: its always there but worse with cleaning, cooking, up stairs, pulling weeds Relieving factors: better in the am, worse in evening  PRECAUTIONS: Left UE lymphedema, FMS, OA , poor vision   WEIGHT BEARING RESTRICTIONS: No  FALLS:  Has patient fallen in last 6 months? No  LIVING ENVIRONMENT: Lives with: lives with their son Lives in: House/apartment Has following equipment at home: Single point cane and Environmental consultant - 2 wheeled  OCCUPATION: Does not work  LEISURE: TV, Dealer  HAND DOMINANCE: right   PRIOR LEVEL OF FUNCTION: Independent  PATIENT GOALS: Decrease pain, greater ability to be active, clean home    OBJECTIVE:  COGNITION: Overall cognitive status: Within functional limits for tasks assessed   PALPATION: no tenderness with moderate palpation to UT, upper and LB, UE Fibromyalgia points; mainly hurts now with using her muscle massager. Improved since steroids in July  OBSERVATIONS / OTHER ASSESSMENTS: uses hands for rising from chair, wearing slippers because shoes hurt her skin  SENSATION: Light touch: Deficits     POSTURE:  forward head, rounded  shoulders   UPPER EXTREMITY  AROM/PROM:  A/PROM RIGHT   eval   Shoulder extension   Shoulder flexion   Shoulder abduction   Shoulder internal rotation   Shoulder external rotation     (Blank rows = not tested)  A/PROM LEFT   eval  Shoulder extension   Shoulder flexion   Shoulder abduction   Shoulder internal rotation   Shoulder external rotation     (Blank rows = not tested)   LUMBAR:  FB   hands to mid lower leg  BB Decreased 70%, pain bilateral LBP Right SB; to knee: pain bilateral LB, then resolves, now I feel dizzy Left SB to knee: no pain     LOWER EXTREMITY AROM/PROM:  A/PROM Right eval  Hip flexion 3  Hip extension 1 bridge very fatigued  Hip abduction   Hip adduction   Hip internal rotation 4  Hip external rotation 4  Knee flexion 4  Knee extension 4  Ankle dorsiflexion 4+  Ankle plantarflexion 4  Ankle inversion   Ankle eversion    (Blank rows = not tested)  A/PROM LEFT eval  Hip flexion 3-  Hip extension 1 bridge very fatigued  Hip abduction 4  Hip adduction 4  Hip internal rotation 3+  Hip external rotation 3+  Knee flexion 4  Knee extension 4  Ankle dorsiflexion 4+  Ankle plantarflexion 4  Ankle inversion   Ankle eversion    (Blank rows = not tested)  LYMPHEDEMA ASSESSMENTS: Pt. Said she has a compression sleeve but doesn't wear it. She does not want to be treated for lymphedema.   SURGERY TYPE/DATE: Left Lumpectomy 12/2014 for Gr. 2 IDC, Mastectomy 01/2015 due to close margins  NUMBER OF LYMPH NODES REMOVED: 2/10  CHEMOTHERAPY: YES  RADIATION:NO  HORMONE TREATMENT: YES  INFECTIONS: NO  LYMPHEDEMA ASSESSMENTS: Pt has a compression sleeve but does not wear. She doesn't want to treat her lymphedema  LANDMARK RIGHT  eval  10 cm proximal to olecranon process   Olecranon process   10 cm proximal to ulnar styloid process   Just proximal to ulnar styloid process   Across hand at thumb web space   At base of 2nd digit   (Blank rows = not  tested)  LANDMARK LEFT  eval  10 cm proximal to olecranon process   Olecranon process   10 cm proximal to ulnar styloid process   Just proximal to ulnar styloid process   Across hand at thumb web space   At base of 2nd digit   (Blank rows = not tested)   FUNCTIONAL TESTS:  5x sit to stand  22.65 seconds  GAIT: Distance walked: front desk to room 8 Assistive device utilized: None Level of assistance: Complete Independence  LOWER Extremity Functional Scale; 13/80   TODAY'S TREATMENT:  DATE:  11/04/2022 Nu Step seat 6, UE 9, Lev 2 x 7 min, 377 steps Meeks decompression x 5 ea, 3 sec hold ppt x 5 Ppt with ball squeeze x 10 FABER stretch bilaterally x 3, 20 sec holds Standing incline stretch at parallel bars x 3 Standing march no HH x 10, heel raises x 10 Standing scapular retraction with yellow band x 10 Bridge x 5 HS stretch with strap x 3 B Standing gastroc stretch at wall x 3 ea, 20 sec  10/22/22:Pt arrives for aquatic physical therapy. Treatment took place in 3.5-5.5 feet of water. Water temperature was 90 degrees F. Pt entered the pool via stairs with mild use of rails. Pt requires buoyancy of water for support and to offload joints with strengthening exercises.  Seated water bench with 75% submersion Pt performed seated LE AROM exercises 20x in all planes, with concurrent discussion of current status. PTA educated pt in water principles but pt was already aware. She has been a swimmer in the past. 75% depth water walking with small noodle 4x each direction monitoring for excessive fatigue. No rest breaks needed. Semi sit against wall with single buoy wts core/lat press 2x6 with Pilates breathing. Single knee to chest in same position 2x 3 breaths. High knee marching holding small noodle 4 lengths. Bil hamstring stretching at stairs 2x 3 breaths  followed by standing childs pose 3x 3 breaths. Pt did breast stroke 2 lenghts at end of session.   10/21/2022 Nu Step seat 6, UE 9, Lev 2 x 5 min to warm up, 273 steps Meeks decompression exercises x 5 ea with 3 second hold Ppt x 10,  ppt x 10 with ball squeeze  LTR x 4 B, SKTC x 3 B FABER stretch B x 3  Seated Calf stretch with strap x 3 ea Updated HEP with Meeks Decompression  10/17/2022 Discussed POC and different treatments Ie aquatics. Pt is interested in trying aquatics  PATIENT EDUCATION:  Access Code: [email or web address] URL: [email or web address] Date: 11/04/2022 Prepared by: Alvira Monday  Exercises - Supine Pelvic Tilt  - 1 x daily - 7 x weekly - 1 sets - 10 reps - Supine Bridge  - 1 x daily - 7 x weekly - 1 sets - 5 reps - Supine Piriformis Stretch with Foot on Ground  - 1 x daily - 7 x weekly - 1 sets - 3 reps - 20 hold - Supine Hamstring Stretch with Strap  - 1 x daily - 7 x weekly - 1 sets - 3 reps - 20 hold - Gastroc Stretch on Wall  - 1 x daily - 7 x weekly - 1 sets - 3 reps - 20 hold Education details: POC, length of stay, Cancellation prn done day before Person educated: Patient Education method: Explanation Education comprehension: verbalized understanding  HOME EXERCISE PROGRAM: NA  ASSESSMENT:  CLINICAL IMPRESSION: Therapy included warm up on Nu step, and gentle stabilization and flexibility exercises. Pt did well, but was very fatigued afterwards. Her HEP was updated. OBJECTIVE IMPAIRMENTS: . decreased activity tolerance, decreased endurance, decreased knowledge of condition, difficulty walking, decreased ROM, decreased strength, increased edema, impaired UE functional use, impaired vision/preception, postural dysfunction, and pain.   ACTIVITY LIMITATIONS: carrying, lifting, bending, sitting, standing, sleeping, stairs, and reach over head   PARTICIPATION LIMITATIONS:  cleaning, community activity, and yard work   PERSONAL FACTORS: Behavior  pattern, Time since onset of injury/illness/exacerbation, and 3+ comorbidities: Left breast cancer s/p chemo  are also affecting patient's functional outcome.  REHAB POTENTIAL: Fair    CLINICAL DECISION MAKING: Stable/uncomplicated  EVALUATION COMPLEXITY: Low  GOALS: Goals reviewed with patient? Yes  SHORT TERM GOALS=LONG TERM GOALS: Target date: 6 weeks  Pt will be independent  and a tolerant of a HEP for gentle ROM/stretching Baseline: Goal status: INITIAL  2.  Pt will have improved strength of Bilateral LE hip flexion, IR and ER to at least 4/5 for improved safety  Baseline:  Goal status: INITIAL  3.  Pt will be able to shop for 25-50% longer periods of time without leaning over the shopping cart Baseline:  Goal status: INITIAL  4.  Pt will be tolerant of low level strengthening to improve core,and LE endurance and strength Baseline:  Goal status: INITIAL  5.  LEFS will be increased to 35/80  demonstrate improved function.  Baseline:13/80  Goal status:INITIAL  6   Pt will be able to perform 5 bridges with 3 second hold to improve strength for bed mobility  Goal Status:  INITIAL  7.  Pt will perform 5 sit to stands in 15 seconds to demonstrate improved strength /function  Baseline:2.6 seconds  Goal Status:INITIAL   PLAN:  PT FREQUENCY: 2x/week  PT DURATION: 8 weeks(only approved for 12 visits)  PLANNED INTERVENTIONS: Therapeutic exercises, Therapeutic activity, Neuromuscular re-education, Gait training, Patient/Family education, Self Care, Joint mobilization, Orthotic/Fit training, Dry Needling, Manual therapy, and Re-evaluation, aquatic therapy  PLAN FOR NEXT SESSION:Assess how pt tolerated the pool  Waynette Buttery, PT 11/04/2022, 3:55 PM

## 2022-11-05 ENCOUNTER — Ambulatory Visit: Payer: Medicaid Other | Admitting: Physical Therapy

## 2022-11-07 ENCOUNTER — Ambulatory Visit: Payer: Medicaid Other | Admitting: Nurse Practitioner

## 2022-11-07 ENCOUNTER — Encounter: Payer: Self-pay | Admitting: Nurse Practitioner

## 2022-11-07 VITALS — BP 116/88 | HR 98 | Temp 97.8°F | Ht 62.0 in | Wt 189.4 lb

## 2022-11-07 DIAGNOSIS — E063 Autoimmune thyroiditis: Secondary | ICD-10-CM | POA: Diagnosis not present

## 2022-11-07 NOTE — Patient Instructions (Signed)

## 2022-11-07 NOTE — Progress Notes (Signed)
BP 116/88   Pulse 98   Temp 97.8 F (36.6 C) (Oral)   Ht 5\' 2"  (1.575 m)   Wt 189 lb 6.4 oz (85.9 kg)   LMP  (LMP Unknown)   SpO2 96%   BMI 34.64 kg/m    Subjective:    Patient ID: Tonya Odonnell, female    DOB: 19-Nov-1965, 57 y.o.   MRN: [id number]  HPI: Tonya Odonnell is a 57 y.o. female  Chief Complaint  Patient presents with   Hypothyroidism    HYPOTHYROIDISM Currently takes Levothyroxine 112 MCG daily.  Recent levels in August noted stable TSH, but some elevation in Free T4.  She is  concerned about the area, as is having pain to area.  Pain from right ear to mid-upper chest.  When she swallows feels pain.    She reports her previous provider told her there was a mass on thyroid, but last ultrasound stable.  Reports she is to have ultrasound every year.   Thyroid control status:stable Satisfied with current treatment? yes Medication side effects: no Medication compliance: good compliance Etiology of hypothyroidism: Hashimoto's Recent dose adjustment:yes -- 112 MCG, she reports her symptoms are approving with this dose -- due for recheck on October 4th Fatigue: yes Cold intolerance: no Heat intolerance: yes Weight gain: no Weight loss: no Constipation: no Diarrhea/loose stools: no Palpitations: no Lower extremity edema: no Anxiety/depressed mood: no    06/17/2021   11:05 AM 06/13/2021   11:10 AM 06/03/2021   11:17 AM  Depression screen PHQ 2/9  Decreased Interest 0 0 0  Down, Depressed, Hopeless 0 0 0  PHQ - 2 Score 0 0 0  Altered sleeping 2    Tired, decreased energy 3    Change in appetite 0    Feeling bad or failure about yourself  1    Trouble concentrating 1    Moving slowly or fidgety/restless 0    Suicidal thoughts 0    PHQ-9 Score 7         06/17/2021   11:05 AM  GAD 7 : Generalized Anxiety Score  Nervous, Anxious, on Edge 0  Control/stop worrying 0  Worry too much - different things 1  Trouble relaxing 2  Restless 0  Easily annoyed or  irritable 0  Afraid - awful might happen 0  Total GAD 7 Score 3  Anxiety Difficulty Somewhat difficult   Relevant past medical, surgical, family and social history reviewed and updated as indicated. Interim medical history since our last visit reviewed. Allergies and medications reviewed and updated.  Review of Systems  Constitutional:  Negative for activity change, appetite change, diaphoresis, fatigue and fever.  Respiratory:  Negative for cough, chest tightness, shortness of breath and wheezing.   Cardiovascular:  Negative for chest pain, palpitations and leg swelling.  Endocrine: Positive for heat intolerance. Negative for cold intolerance.  Neurological: Negative.   Psychiatric/Behavioral: Negative.      Per HPI unless specifically indicated above     Objective:    BP 116/88   Pulse 98   Temp 97.8 F (36.6 C) (Oral)   Ht 5\' 2"  (1.575 m)   Wt 189 lb 6.4 oz (85.9 kg)   LMP  (LMP Unknown)   SpO2 96%   BMI 34.64 kg/m   Wt Readings from Last 3 Encounters:  11/07/22 189 lb 6.4 oz (85.9 kg)  10/29/22 189 lb 9.6 oz (86 kg)  10/27/22 191 lb (86.6 kg)    Physical Exam Vitals and nursing  note reviewed.  Constitutional:      General: She is awake. She is not in acute distress.    Appearance: She is well-developed and well-groomed. She is obese. She is not ill-appearing or toxic-appearing.  HENT:     Head: Normocephalic.     Right Ear: Hearing and external ear normal.     Left Ear: Hearing and external ear normal.  Eyes:     General: Lids are normal.        Right eye: No discharge.        Left eye: No discharge.     Conjunctiva/sclera: Conjunctivae normal.     Pupils: Pupils are equal, round, and reactive to light.  Neck:     Thyroid: Thyromegaly present.     Vascular: No carotid bruit.  Cardiovascular:     Rate and Rhythm: Normal rate and regular rhythm.     Heart sounds: Normal heart sounds. No murmur heard.    No gallop.  Pulmonary:     Effort: Pulmonary effort  is normal. No accessory muscle usage or respiratory distress.     Breath sounds: Normal breath sounds.  Abdominal:     General: Bowel sounds are normal. There is no distension.     Palpations: Abdomen is soft.     Tenderness: There is no abdominal tenderness.  Musculoskeletal:     Cervical back: Normal range of motion and neck supple.     Right lower leg: No edema.     Left lower leg: No edema.  Lymphadenopathy:     Cervical: No cervical adenopathy.  Skin:    General: Skin is warm and dry.  Neurological:     Mental Status: She is alert and oriented to person, place, and time.     Deep Tendon Reflexes: Reflexes are normal and symmetric.     Reflex Scores:      Brachioradialis reflexes are 2+ on the right side and 2+ on the left side.      Patellar reflexes are 2+ on the right side and 2+ on the left side. Psychiatric:        Attention and Perception: Attention normal.        Mood and Affect: Mood normal.        Speech: Speech normal.        Behavior: Behavior normal. Behavior is cooperative.        Thought Content: Thought content normal.     Results for orders placed or performed in visit on 10/30/22  HM MAMMOGRAPHY  Result Value Ref Range   HM Mammogram 0-4 Bi-Rad 0-4 Bi-Rad, Self Reported Normal      Assessment & Plan:   Problem List Items Addressed This Visit       Endocrine   Hashimoto's thyroiditis - Primary    Chronic, ongoing. Continue current medication regimen and adjust as needed based on labs.  TSH and Free T4 up to date, mild elevation in Free T4.  Dose was adjusted.  Will obtain repeat ultrasound due to patient having pain in thyroid area + her concern as previous endo provider told her she needed yearly ultrasounds.      Relevant Orders   US THYROID     Follow up plan: Return in about 6 weeks (around 12/19/2022) for THYROID VISIT -- please schedule if not scheduled.[name]

## 2022-11-07 NOTE — Assessment & Plan Note (Signed)
Chronic, ongoing. Continue current medication regimen and adjust as needed based on labs.  TSH and Free T4 up to date, mild elevation in Free T4.  Dose was adjusted.  Will obtain repeat ultrasound due to patient having pain in thyroid area + her concern as previous endo provider told her she needed yearly ultrasounds.

## 2022-11-09 DIAGNOSIS — Z419 Encounter for procedure for purposes other than remedying health state, unspecified: Secondary | ICD-10-CM | POA: Diagnosis not present

## 2022-11-09 IMAGING — DX DG SHOULDER 2+V*R*
3 series · 3 of 3 positions shown · non-contrast
Comparison: None Available.

CLINICAL DATA: Right shoulder pain.

EXAM:
RIGHT SHOULDER - 2+ VIEW

[shoulder grashey]
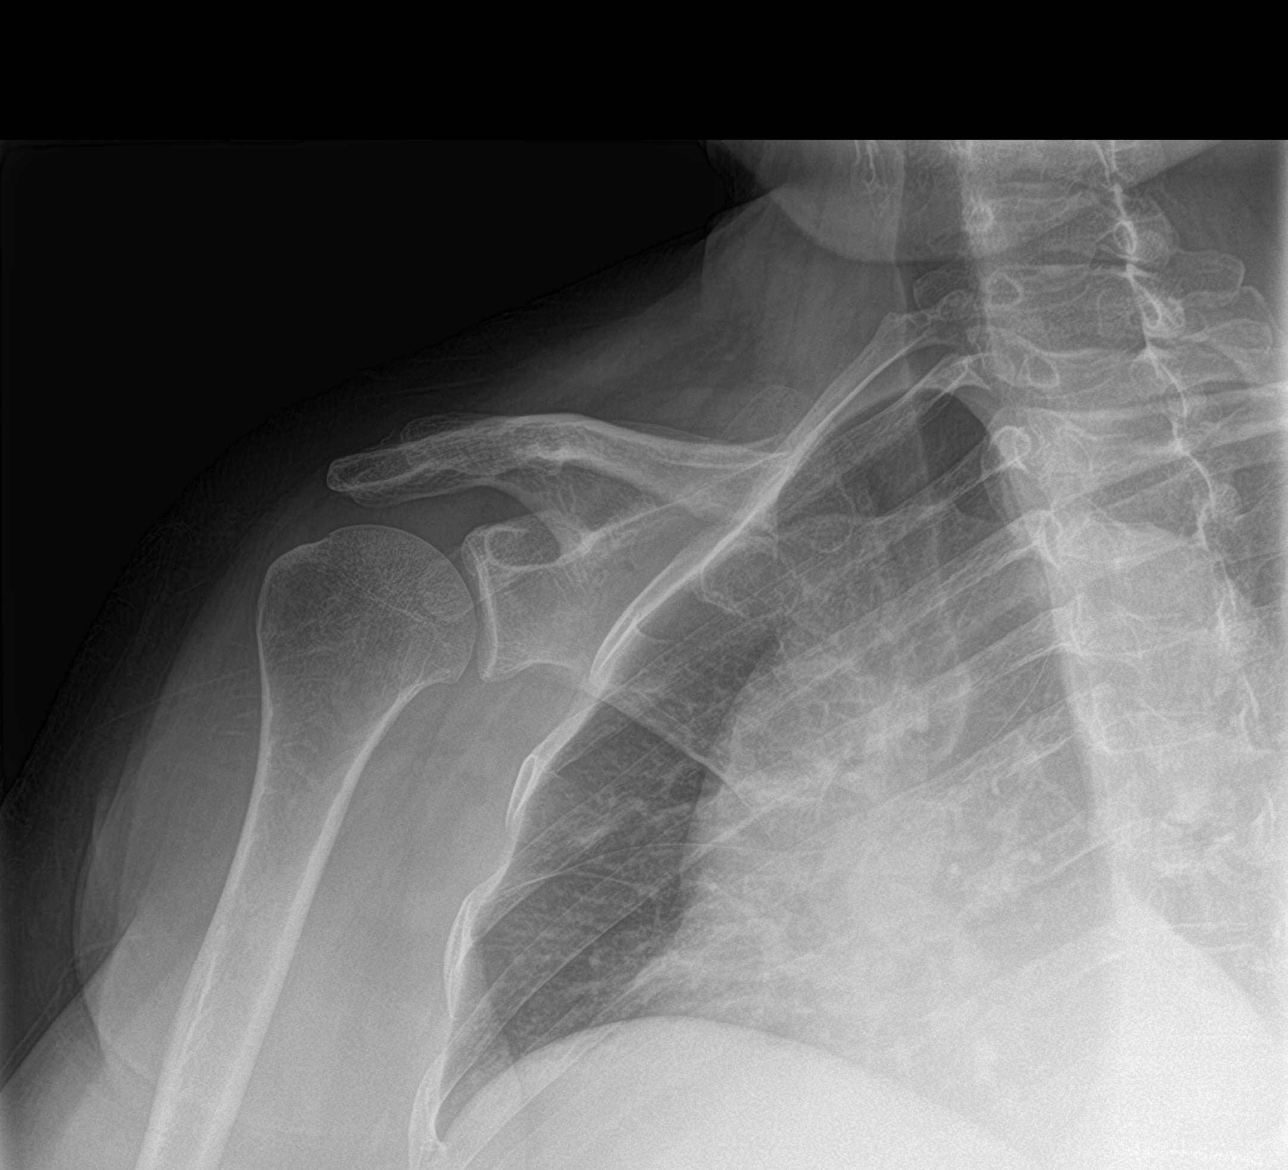

[shoulder y view]
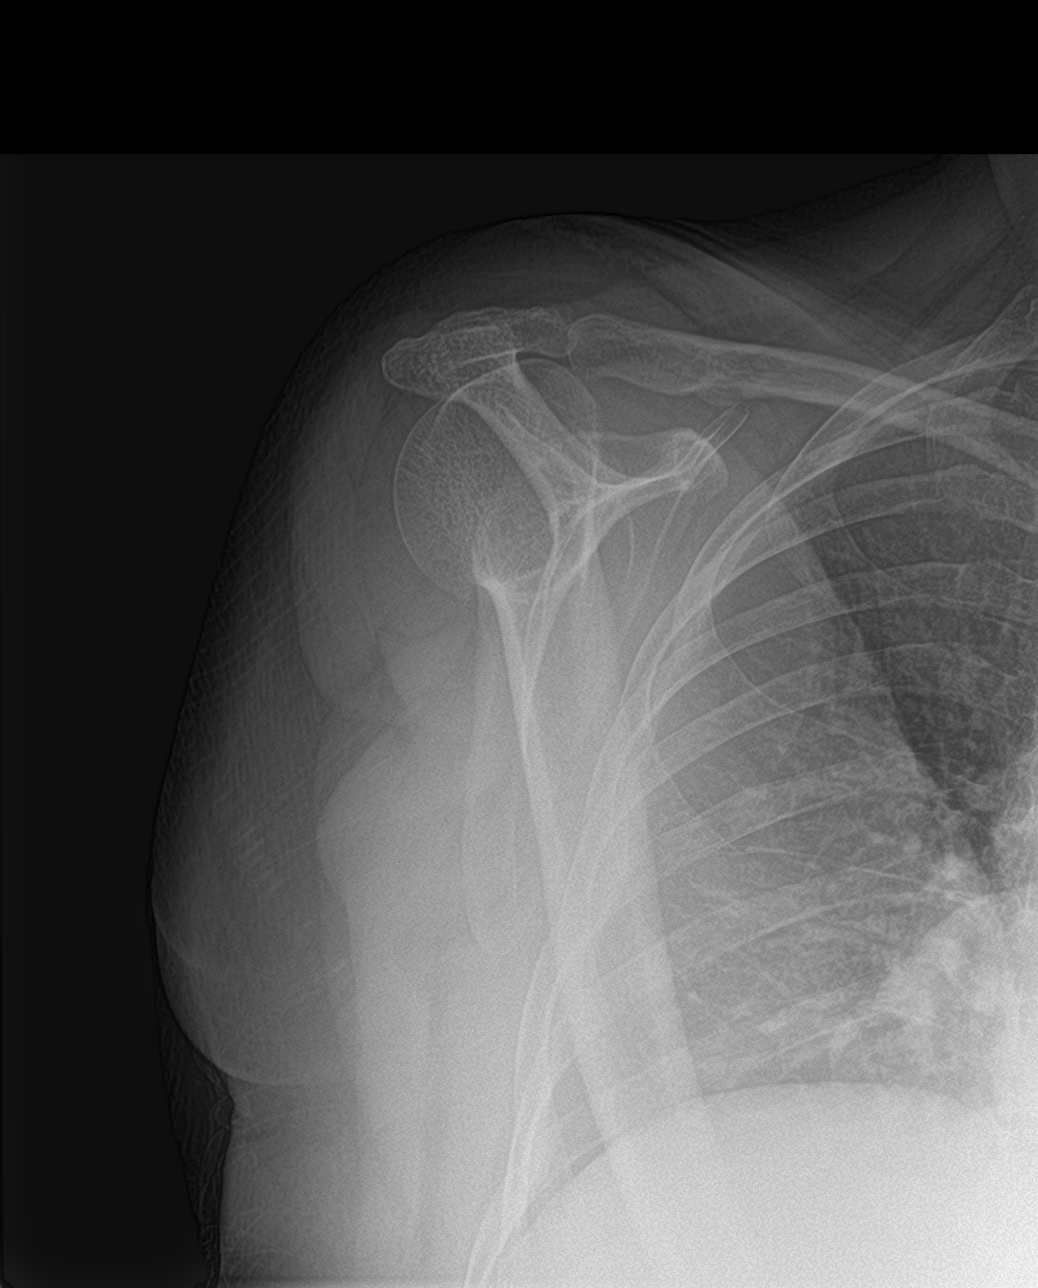

[shoulder axillary]
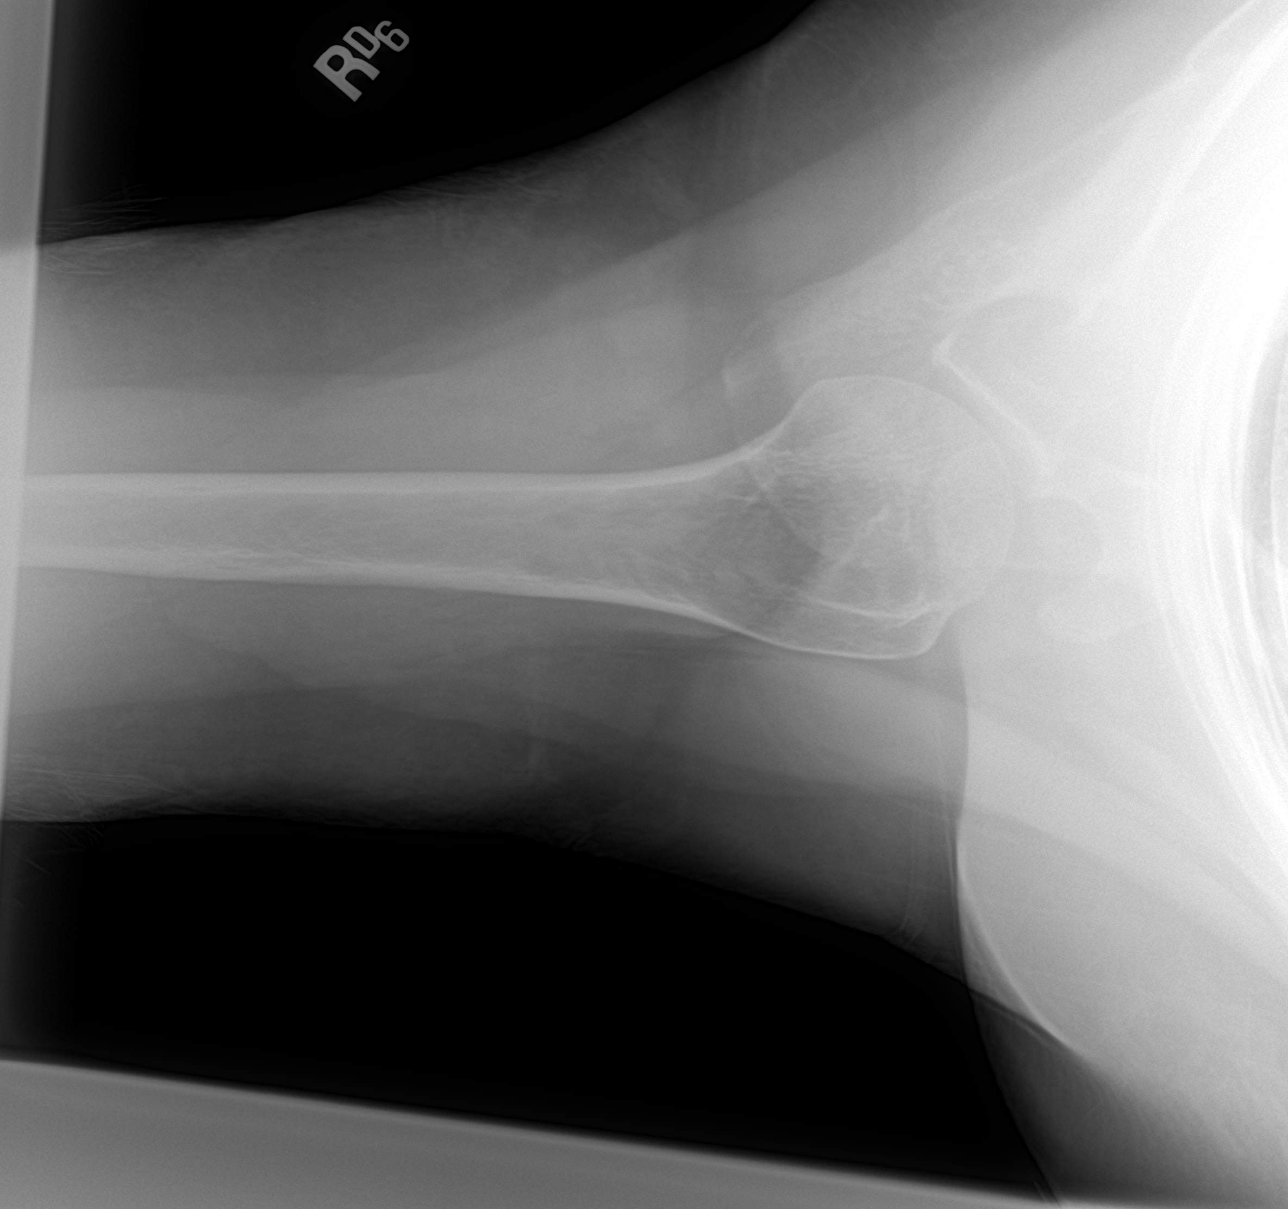

[3 of 3 positions shown; findings below may reference images not displayed]

FINDINGS: Normal bone mineralization. Minimal inferior humeral head-neck
junction degenerative spurring. Minimal peripheral glenoid
degenerative osteophytosis. No significant osteoarthritis at the
right acromioclavicular joint. No acute fracture is seen. No
dislocation. The cortices are intact. The visualized portion of the
right lung is unremarkable.
IMPRESSION: Minimal glenohumeral osteoarthritis.

## 2022-11-11 NOTE — Therapy (Signed)
OUTPATIENT PHYSICAL THERAPY ONCOLOGY TREATMENT  Patient Name: Tonya Odonnell MRN: [id number] DOB:[birth year], 57 y.o., female Today's Date: 11/12/2022  END OF SESSION:  PT End of Session - 11/12/22 0951     Visit Number 5    Number of Visits 12    Date for PT Re-Evaluation 12/12/22    PT Start Time 0900    PT Stop Time 0938    PT Time Calculation (min) 38 min    Activity Tolerance Patient tolerated treatment well    Behavior During Therapy WFL for tasks assessed/performed               Past Medical History:  Diagnosis Date   Anemia    "USE TO HAVE IT,NOT NOW,2015,2016   Arthritis    HIPS,KNEES,HANDS   Breast cancer in female (HCC)    Cancer (HCC)    LEFT   Hyperlipidemia    Hypertension    Low iron    Neuromuscular disorder (HCC)    MUSCLE PAIN,FIBROMYALGIA   Osteopenia    PVC (premature ventricular contraction)    Thyroid disease    Past Surgical History:  Procedure Laterality Date   Left Ovarian removal Left    MASTECTOMY Left    "LYMPHNODES REMOVED   Patient Active Problem List   Diagnosis Date Noted   Iron deficiency anemia 09/01/2022   Hemorrhoids 08/14/2022   Lichen sclerosus of female genitalia 12/25/2021   Adrenal adenoma, left 09/17/2021   Pain management contract signed 06/13/2021   Chronic pain syndrome 06/03/2021   Chronic arthralgias of knees and hips 05/06/2021   Osteopenia of lumbar spine 05/05/2021   Obesity 05/05/2021   Anxiety 12/05/2020   Gallstones 12/05/2020   Gastroesophageal reflux disease without esophagitis 12/05/2020   Prediabetes 12/05/2020   Hyperlipidemia 02/08/2018   Hepatitis B core antibody positive 08/07/2017   History of breast cancer 12/13/2014   Chronic low back pain 02/17/2014   Fibromyalgia 10/26/2013   Adverse reaction to influenza vaccine 11/22/2012   Essential (primary) hypertension 05/05/2012   Hashimoto's thyroiditis 05/05/2012      REFERRING PROVIDER: Aura Dials, NP  REFERRING DIAG: Chronic LBP,  Fibomyalgia, hip and knee pain  THERAPY DIAG:  Muscle weakness (generalized)  Other low back pain  Pain in left hip  Chronic pain of both knees  Chronic pain disorder  Personal history of breast cancer  Fibromyalgia syndrome  Stiffness of right shoulder, not elsewhere classified  Abnormal posture  Stiffness of left shoulder, not elsewhere classified  ONSET DATE: 2016 with worsening of symptoms  Rationale for Evaluation and Treatment: Rehabilitation  SUBJECTIVE:  SUBJECTIVE STATEMENT: I was tired after my first water session but I didn't feel bad. I am always tired in the afternoon. I'm sorry I missed last week. I need GPS to get here and I couldn't find my phone. My face is very swollen today and it bothers me. Tomorrow I get an ultrasound on my Thyroid.    PERTINENT HISTORY:  Pt is s/p left mastectomy in 2016 with chemotherapy and hormonal therapy which was discontinued in 2022.[name] She was diagnosed with FMS a number of years ago. She complains of  diffuse body aches and retention of fluid. After she finished chemo in 2016  she was on Nortriptyline. When she stopped it she felt a flush of water in her body and since then she has pain in muscles and skin. When she exercises the next day she feels like she can't move. She has tried several different anti depressant medications. Sometimes ibuprofen helps as long as she only takes it 1x/day. She can't sit in chairs more than 20 minutes because it bothers her back. She has been trying to find out for 7 years what is going on. She has arthritis and muscle pain all together. She has a sharp knife like pain in her LB .She was given steroids and muscle relaxer. The back pain started to resolve and she was urinating 5-6 times at night.and she started feeling so  much better. She was getting extremely tired and just couldn't do much.  She is no longer on the meds and the pain is slowly starting to come back. She was diagnosed with Fibromyalgia after a swine flu vaccine. She got the vaccine on the left side and her left side of the body felt very different from the right. It got worse in 2015. After that they found the cancer in her LN's. She has swelling in the left UE, and had treatment in [place] with MLD and she has a compression sleeve but she doesn't wear it. She is not interested in being treated for lymphedema as she was treated in [place].[name]     PAIN:  Are you having pain? Yes NPRS scale: 8/10 Pain location: all over Pain orientation: Bilateral  PAIN TYPE: feel heavy like I am holding a lot of weight,fatigue Pain description: constant  Aggravating factors: its always there but worse with cleaning, cooking, up stairs, pulling weeds Relieving factors: better in the am, worse in evening  PRECAUTIONS: Left UE lymphedema, FMS, OA , poor vision   WEIGHT BEARING RESTRICTIONS: No  FALLS:  Has patient fallen in last 6 months? No  LIVING ENVIRONMENT: Lives with: lives with their son Lives in: House/apartment Has following equipment at home: Single point cane and Environmental consultant - 2 wheeled  OCCUPATION: Does not work  LEISURE: TV, Dealer  HAND DOMINANCE: right   PRIOR LEVEL OF FUNCTION: Independent  PATIENT GOALS: Decrease pain, greater ability to be active, clean home    OBJECTIVE:  COGNITION: Overall cognitive status: Within functional limits for tasks assessed   PALPATION: no tenderness with moderate palpation to UT, upper and LB, UE Fibromyalgia points; mainly hurts now with using her muscle massager. Improved since steroids in July  OBSERVATIONS / OTHER ASSESSMENTS: uses hands for rising from chair, wearing slippers because shoes hurt her skin  SENSATION: Light touch: Deficits     POSTURE:  forward head, rounded shoulders    UPPER EXTREMITY AROM/PROM:  A/PROM RIGHT   eval   Shoulder extension   Shoulder flexion   Shoulder abduction  Shoulder internal rotation   Shoulder external rotation     (Blank rows = not tested)  A/PROM LEFT   eval  Shoulder extension   Shoulder flexion   Shoulder abduction   Shoulder internal rotation   Shoulder external rotation     (Blank rows = not tested)   LUMBAR:  FB   hands to mid lower leg  BB Decreased 70%, pain bilateral LBP Right SB; to knee: pain bilateral LB, then resolves, now I feel dizzy Left SB to knee: no pain     LOWER EXTREMITY AROM/PROM:  A/PROM Right eval  Hip flexion 3  Hip extension 1 bridge very fatigued  Hip abduction   Hip adduction   Hip internal rotation 4  Hip external rotation 4  Knee flexion 4  Knee extension 4  Ankle dorsiflexion 4+  Ankle plantarflexion 4  Ankle inversion   Ankle eversion    (Blank rows = not tested)  A/PROM LEFT eval  Hip flexion 3-  Hip extension 1 bridge very fatigued  Hip abduction 4  Hip adduction 4  Hip internal rotation 3+  Hip external rotation 3+  Knee flexion 4  Knee extension 4  Ankle dorsiflexion 4+  Ankle plantarflexion 4  Ankle inversion   Ankle eversion    (Blank rows = not tested)  LYMPHEDEMA ASSESSMENTS: Pt. Said she has a compression sleeve but doesn't wear it. She does not want to be treated for lymphedema.   SURGERY TYPE/DATE: Left Lumpectomy 12/2014 for Gr. 2 IDC, Mastectomy 01/2015 due to close margins  NUMBER OF LYMPH NODES REMOVED: 2/10  CHEMOTHERAPY: YES  RADIATION:NO  HORMONE TREATMENT: YES  INFECTIONS: NO  LYMPHEDEMA ASSESSMENTS: Pt has a compression sleeve but does not wear. She doesn't want to treat her lymphedema  LANDMARK RIGHT  eval  10 cm proximal to olecranon process   Olecranon process   10 cm proximal to ulnar styloid process   Just proximal to ulnar styloid process   Across hand at thumb web space   At base of 2nd digit   (Blank rows =  not tested)  LANDMARK LEFT  eval  10 cm proximal to olecranon process   Olecranon process   10 cm proximal to ulnar styloid process   Just proximal to ulnar styloid process   Across hand at thumb web space   At base of 2nd digit   (Blank rows = not tested)   FUNCTIONAL TESTS:  5x sit to stand  22.65 seconds  GAIT: Distance walked: front desk to room 8 Assistive device utilized: None Level of assistance: Complete Independence  LOWER Extremity Functional Scale; 13/80   TODAY'S TREATMENT:                                                                                                                                         DATE:   11/12/22:Pt arrives for aquatic physical therapy.  Treatment took place in 3.5-5.5 feet of water. Water temperature was 91 degrees F. Pt entered the pool via stairs with mild use of rails. Pt requires buoyancy of water for support and to offload joints with strengthening exercises.  Seated water bench with 75% submersion Pt performed seated LE AROM exercises 20x in all planes, with concurrent discussion of current status.  75% depth water walking with small noodle 4x each direction monitoring for excessive fatigue. Took 30 sec rest breaks today in between her direction changes. Standing with mitten hands core/lat press 10x with Pilates breathing. Single knee to chest in same position 2x 3 breaths. High knee marching 20x. Bil hamstring stretching at stairs 2x 3 breaths followed by standing childs pose 3x 3 breaths. Pt did breast stroke 2x2 lenghts at end of session. Standing shoulder horizontal abd/add 2x10 with single buoy UE wts.   11/04/2022 Nu Step seat 6, UE 9, Lev 2 x 7 min, 377 steps Meeks decompression x 5 ea, 3 sec hold ppt x 5 Ppt with ball squeeze x 10 FABER stretch bilaterally x 3, 20 sec holds Standing incline stretch at parallel bars x 3 Standing march no HH x 10, heel raises x 10 Standing scapular retraction with yellow band x 10 Bridge x 5 HS  stretch with strap x 3 B Standing gastroc stretch at wall x 3 ea, 20 sec  10/22/22:Pt arrives for aquatic physical therapy. Treatment took place in 3.5-5.5 feet of water. Water temperature was 90 degrees F. Pt entered the pool via stairs with mild use of rails. Pt requires buoyancy of water for support and to offload joints with strengthening exercises.  Seated water bench with 75% submersion Pt performed seated LE AROM exercises 20x in all planes, with concurrent discussion of current status. PTA educated pt in water principles but pt was already aware. She has been a swimmer in the past. 75% depth water walking with small noodle 4x each direction monitoring for excessive fatigue. No rest breaks needed. Semi sit against wall with single buoy wts core/lat press 2x6 with Pilates breathing. Single knee to chest in same position 2x 3 breaths. High knee marching holding small noodle 4 lengths. Bil hamstring stretching at stairs 2x 3 breaths followed by standing childs pose 3x 3 breaths. Pt did breast stroke 2 lenghts at end of session.   PATIENT EDUCATION:  Access Code: [email or web address] URL: [email or web address] Date: 11/04/2022 Prepared by: Alvira Monday  Exercises - Supine Pelvic Tilt  - 1 x daily - 7 x weekly - 1 sets - 10 reps - Supine Bridge  - 1 x daily - 7 x weekly - 1 sets - 5 reps - Supine Piriformis Stretch with Foot on Ground  - 1 x daily - 7 x weekly - 1 sets - 3 reps - 20 hold - Supine Hamstring Stretch with Strap  - 1 x daily - 7 x weekly - 1 sets - 3 reps - 20 hold - Gastroc Stretch on Wall  - 1 x daily - 7 x weekly - 1 sets - 3 reps - 20 hold Education details: POC, length of stay, Cancellation prn done day before Person educated: Patient Education method: Explanation Education comprehension: verbalized understanding  HOME EXERCISE PROGRAM: NA  ASSESSMENT:  CLINICAL IMPRESSION: Due to pt's reported fatigue levels after her fist aquatic session, we tapered down some of  her exercises while keeping the others about the same level. Pt tolerates everything well when she is in the water but we need to  monitor her fatigue levels on land post. Pt LOVES swimming and when she swims in the pool you can immediately see her mood lift.   OBJECTIVE IMPAIRMENTS: . decreased activity tolerance, decreased endurance, decreased knowledge of condition, difficulty walking, decreased ROM, decreased strength, increased edema, impaired UE functional use, impaired vision/preception, postural dysfunction, and pain.   ACTIVITY LIMITATIONS: carrying, lifting, bending, sitting, standing, sleeping, stairs, and reach over head   PARTICIPATION LIMITATIONS:  cleaning, community activity, and yard work   PERSONAL FACTORS: Behavior pattern, Time since onset of injury/illness/exacerbation, and 3+ comorbidities: Left breast cancer s/p chemo  are also affecting patient's functional outcome.   REHAB POTENTIAL: Fair    CLINICAL DECISION MAKING: Stable/uncomplicated  EVALUATION COMPLEXITY: Low  GOALS: Goals reviewed with patient? Yes  SHORT TERM GOALS=LONG TERM GOALS: Target date: 6 weeks  Pt will be independent  and a tolerant of a HEP for gentle ROM/stretching Baseline: Goal status: INITIAL  2.  Pt will have improved strength of Bilateral LE hip flexion, IR and ER to at least 4/5 for improved safety  Baseline:  Goal status: INITIAL  3.  Pt will be able to shop for 25-50% longer periods of time without leaning over the shopping cart Baseline:  Goal status: INITIAL  4.  Pt will be tolerant of low level strengthening to improve core,and LE endurance and strength Baseline:  Goal status: INITIAL  5.  LEFS will be increased to 35/80  demonstrate improved function.  Baseline:13/80  Goal status:INITIAL  6   Pt will be able to perform 5 bridges with 3 second hold to improve strength for bed mobility  Goal Status:  INITIAL  7.  Pt will perform 5 sit to stands in 15 seconds to  demonstrate improved strength /function  Baseline:2.6 seconds  Goal Status:INITIAL   PLAN:  PT FREQUENCY: 2x/week  PT DURATION: 8 weeks(only approved for 12 visits)  PLANNED INTERVENTIONS: Therapeutic exercises, Therapeutic activity, Neuromuscular re-education, Gait training, Patient/Family education, Self Care, Joint mobilization, Orthotic/Fit training, Dry Needling, Manual therapy, and Re-evaluation, aquatic therapy  PLAN FOR NEXT SESSION: Review STGs on next land appt. Check pt's fatigue post todays aquatic to make sure it was appropriate.   Francina Beery, PTA 11/12/2022, 9:52 AM

## 2022-11-12 ENCOUNTER — Encounter: Payer: Self-pay | Admitting: Physical Therapy

## 2022-11-12 ENCOUNTER — Ambulatory Visit: Payer: Medicaid Other | Attending: Nurse Practitioner | Admitting: Physical Therapy

## 2022-11-12 DIAGNOSIS — M5459 Other low back pain: Secondary | ICD-10-CM | POA: Diagnosis not present

## 2022-11-12 DIAGNOSIS — M6281 Muscle weakness (generalized): Secondary | ICD-10-CM | POA: Diagnosis not present

## 2022-11-12 DIAGNOSIS — M797 Fibromyalgia: Secondary | ICD-10-CM | POA: Insufficient documentation

## 2022-11-12 DIAGNOSIS — R293 Abnormal posture: Secondary | ICD-10-CM | POA: Insufficient documentation

## 2022-11-12 DIAGNOSIS — M25552 Pain in left hip: Secondary | ICD-10-CM | POA: Insufficient documentation

## 2022-11-12 DIAGNOSIS — M25561 Pain in right knee: Secondary | ICD-10-CM | POA: Insufficient documentation

## 2022-11-12 DIAGNOSIS — G894 Chronic pain syndrome: Secondary | ICD-10-CM | POA: Diagnosis not present

## 2022-11-12 DIAGNOSIS — G8929 Other chronic pain: Secondary | ICD-10-CM | POA: Diagnosis not present

## 2022-11-12 DIAGNOSIS — M25562 Pain in left knee: Secondary | ICD-10-CM | POA: Diagnosis not present

## 2022-11-12 DIAGNOSIS — M25612 Stiffness of left shoulder, not elsewhere classified: Secondary | ICD-10-CM | POA: Diagnosis not present

## 2022-11-12 DIAGNOSIS — M25611 Stiffness of right shoulder, not elsewhere classified: Secondary | ICD-10-CM | POA: Insufficient documentation

## 2022-11-12 DIAGNOSIS — Z853 Personal history of malignant neoplasm of breast: Secondary | ICD-10-CM | POA: Diagnosis not present

## 2022-11-13 ENCOUNTER — Ambulatory Visit
Admission: RE | Admit: 2022-11-13 | Discharge: 2022-11-13 | Disposition: A | Discharge status: Home / Self Care | Payer: Medicaid Other | Source: Ambulatory Visit | Attending: Nurse Practitioner | Admitting: Nurse Practitioner

## 2022-11-13 DIAGNOSIS — E063 Autoimmune thyroiditis: Secondary | ICD-10-CM | POA: Insufficient documentation

## 2022-11-14 ENCOUNTER — Ambulatory Visit: Payer: Medicaid Other

## 2022-11-14 DIAGNOSIS — G8929 Other chronic pain: Secondary | ICD-10-CM

## 2022-11-14 DIAGNOSIS — M6281 Muscle weakness (generalized): Secondary | ICD-10-CM

## 2022-11-14 DIAGNOSIS — G894 Chronic pain syndrome: Secondary | ICD-10-CM | POA: Diagnosis not present

## 2022-11-14 DIAGNOSIS — M25612 Stiffness of left shoulder, not elsewhere classified: Secondary | ICD-10-CM | POA: Diagnosis not present

## 2022-11-14 DIAGNOSIS — M25552 Pain in left hip: Secondary | ICD-10-CM

## 2022-11-14 DIAGNOSIS — M5459 Other low back pain: Secondary | ICD-10-CM | POA: Diagnosis not present

## 2022-11-14 DIAGNOSIS — Z853 Personal history of malignant neoplasm of breast: Secondary | ICD-10-CM | POA: Diagnosis not present

## 2022-11-14 DIAGNOSIS — M25561 Pain in right knee: Secondary | ICD-10-CM | POA: Diagnosis not present

## 2022-11-14 DIAGNOSIS — M25611 Stiffness of right shoulder, not elsewhere classified: Secondary | ICD-10-CM | POA: Diagnosis not present

## 2022-11-14 DIAGNOSIS — M797 Fibromyalgia: Secondary | ICD-10-CM | POA: Diagnosis not present

## 2022-11-14 DIAGNOSIS — R293 Abnormal posture: Secondary | ICD-10-CM | POA: Diagnosis not present

## 2022-11-14 DIAGNOSIS — M25562 Pain in left knee: Secondary | ICD-10-CM | POA: Diagnosis not present

## 2022-11-14 NOTE — Therapy (Signed)
OUTPATIENT PHYSICAL THERAPY ONCOLOGY TREATMENT  Patient Name: Tonya Odonnell MRN: [id number] DOB:[birth year], 57 y.o., female Today's Date: 11/14/2022  END OF SESSION:  PT End of Session - 11/14/22 1111     Visit Number 6    Number of Visits 12    Date for PT Re-Evaluation 12/12/22    PT Start Time 1107    PT Stop Time 1155   pt fatigued and ready to stop at this time   PT Time Calculation (min) 48 min    Activity Tolerance Patient tolerated treatment well;Patient limited by fatigue    Behavior During Therapy [facility] for tasks assessed/performed               Past Medical History:  Diagnosis Date   Anemia    "USE TO HAVE IT,NOT NOW,2015,2016   Arthritis    HIPS,KNEES,HANDS   Breast cancer in female (HCC)    Cancer (HCC)    LEFT   Hyperlipidemia    Hypertension    Low iron    Neuromuscular disorder (HCC)    MUSCLE PAIN,FIBROMYALGIA   Osteopenia    PVC (premature ventricular contraction)    Thyroid disease    Past Surgical History:  Procedure Laterality Date   Left Ovarian removal Left    MASTECTOMY Left    "LYMPHNODES REMOVED   Patient Active Problem List   Diagnosis Date Noted   Iron deficiency anemia 09/01/2022   Hemorrhoids 08/14/2022   Lichen sclerosus of female genitalia 12/25/2021   Adrenal adenoma, left 09/17/2021   Pain management contract signed 06/13/2021   Chronic pain syndrome 06/03/2021   Chronic arthralgias of knees and hips 05/06/2021   Osteopenia of lumbar spine 05/05/2021   Obesity 05/05/2021   Anxiety 12/05/2020   Gallstones 12/05/2020   Gastroesophageal reflux disease without esophagitis 12/05/2020   Prediabetes 12/05/2020   Hyperlipidemia 02/08/2018   Hepatitis B core antibody positive 08/07/2017   History of breast cancer 12/13/2014   Chronic low back pain 02/17/2014   Fibromyalgia 10/26/2013   Adverse reaction to influenza vaccine 11/22/2012   Essential (primary) hypertension 05/05/2012   Hashimoto's thyroiditis 05/05/2012       REFERRING PROVIDER: Aura Dials, NP  REFERRING DIAG: Chronic LBP, Fibomyalgia, hip and knee pain  THERAPY DIAG:  Muscle weakness (generalized)  Other low back pain  Pain in left hip  Chronic pain of both knees  Chronic pain disorder  Personal history of breast cancer  Fibromyalgia syndrome  ONSET DATE: 2016 with worsening of symptoms  Rationale for Evaluation and Treatment: Rehabilitation  SUBJECTIVE:  SUBJECTIVE STATEMENT: My fatigue was improved after last aquatics session. I was able to go to [employer] and go home and make some food before I needed to rest. It was much better.    PERTINENT HISTORY:  Pt is s/p left mastectomy in 2016 with chemotherapy and hormonal therapy which was discontinued in 2022.[name] She was diagnosed with FMS a number of years ago. She complains of  diffuse body aches and retention of fluid. After she finished chemo in 2016  she was on Nortriptyline. When she stopped it she felt a flush of water in her body and since then she has pain in muscles and skin. When she exercises the next day she feels like she can't move. She has tried several different anti depressant medications. Sometimes ibuprofen helps as long as she only takes it 1x/day. She can't sit in chairs more than 20 minutes because it bothers her back. She has been trying to find out for 7 years what is going on. She has arthritis and muscle pain all together. She has a sharp knife like pain in her LB .She was given steroids and muscle relaxer. The back pain started to resolve and she was urinating 5-6 times at night.and she started feeling so much better. She was getting extremely tired and just couldn't do much.  She is no longer on the meds and the pain is slowly starting to come back. She was diagnosed  with Fibromyalgia after a swine flu vaccine. She got the vaccine on the left side and her left side of the body felt very different from the right. It got worse in 2015. After that they found the cancer in her LN's. She has swelling in the left UE, and had treatment in [place] with MLD and she has a compression sleeve but she doesn't wear it. She is not interested in being treated for lymphedema as she was treated in [place].[name]     PAIN:  Are you having pain? Yes NPRS scale: 7/10 Pain location: all over Pain orientation: Bilateral  PAIN TYPE: feel heavy like I am holding a lot of weight,fatigue Pain description: constant  Aggravating factors: its always there but worse with cleaning, cooking, up stairs, pulling weeds Relieving factors: better in the am, worse in evening  PRECAUTIONS: Left UE lymphedema, FMS, OA , poor vision   WEIGHT BEARING RESTRICTIONS: No  FALLS:  Has patient fallen in last 6 months? No  LIVING ENVIRONMENT: Lives with: lives with their son Lives in: House/apartment Has following equipment at home: Single point cane and Environmental consultant - 2 wheeled  OCCUPATION: Does not work  LEISURE: TV, Dealer  HAND DOMINANCE: right   PRIOR LEVEL OF FUNCTION: Independent  PATIENT GOALS: Decrease pain, greater ability to be active, clean home    OBJECTIVE:  COGNITION: Overall cognitive status: Within functional limits for tasks assessed   PALPATION: no tenderness with moderate palpation to UT, upper and LB, UE Fibromyalgia points; mainly hurts now with using her muscle massager. Improved since steroids in July  OBSERVATIONS / OTHER ASSESSMENTS: uses hands for rising from chair, wearing slippers because shoes hurt her skin  SENSATION: Light touch: Deficits     POSTURE:  forward head, rounded shoulders   UPPER EXTREMITY AROM/PROM:  A/PROM RIGHT   eval   Shoulder extension   Shoulder flexion   Shoulder abduction   Shoulder internal rotation   Shoulder  external rotation     (Blank rows = not tested)  A/PROM LEFT  eval  Shoulder extension   Shoulder flexion   Shoulder abduction   Shoulder internal rotation   Shoulder external rotation     (Blank rows = not tested)   LUMBAR:  FB   hands to mid lower leg  BB Decreased 70%, pain bilateral LBP Right SB; to knee: pain bilateral LB, then resolves, now I feel dizzy Left SB to knee: no pain     LOWER EXTREMITY AROM/PROM:  A/PROM Right eval  Hip flexion 3  Hip extension 1 bridge very fatigued  Hip abduction   Hip adduction   Hip internal rotation 4  Hip external rotation 4  Knee flexion 4  Knee extension 4  Ankle dorsiflexion 4+  Ankle plantarflexion 4  Ankle inversion   Ankle eversion    (Blank rows = not tested)  A/PROM LEFT eval  Hip flexion 3-  Hip extension 1 bridge very fatigued  Hip abduction 4  Hip adduction 4  Hip internal rotation 3+  Hip external rotation 3+  Knee flexion 4  Knee extension 4  Ankle dorsiflexion 4+  Ankle plantarflexion 4  Ankle inversion   Ankle eversion    (Blank rows = not tested)  LYMPHEDEMA ASSESSMENTS: Pt. Said she has a compression sleeve but doesn't wear it. She does not want to be treated for lymphedema.   SURGERY TYPE/DATE: Left Lumpectomy 12/2014 for Gr. 2 IDC, Mastectomy 01/2015 due to close margins  NUMBER OF LYMPH NODES REMOVED: 2/10  CHEMOTHERAPY: YES  RADIATION:NO  HORMONE TREATMENT: YES  INFECTIONS: NO  LYMPHEDEMA ASSESSMENTS: Pt has a compression sleeve but does not wear. She doesn't want to treat her lymphedema  LANDMARK RIGHT  eval  10 cm proximal to olecranon process   Olecranon process   10 cm proximal to ulnar styloid process   Just proximal to ulnar styloid process   Across hand at thumb web space   At base of 2nd digit   (Blank rows = not tested)  LANDMARK LEFT  eval  10 cm proximal to olecranon process   Olecranon process   10 cm proximal to ulnar styloid process   Just proximal to  ulnar styloid process   Across hand at thumb web space   At base of 2nd digit   (Blank rows = not tested)   FUNCTIONAL TESTS:  5x sit to stand  22.65 seconds  GAIT: Distance walked: front desk to room 8 Assistive device utilized: None Level of assistance: Complete Independence  LOWER Extremity Functional Scale; 13/80   TODAY'S TREATMENT:                                                                                                                                         DATE:  11/14/22: Nu Step seat 6, UE 9, Lev 3 x 4 min, 173 steps; pt reports increased muscle fatigue so stopped Meeks decompression x 5 ea, 3 sec hold  Post pelvic tilt x 15, 5 sec holds Ppt with following exs:  ball squeeze x 10, VC's to hold tilt throughout as pt struggled with this; tilt with alt UE flex x 5 each, tilt with clams (knees open/close) 3 x 5 Piriformis figure 4 stretch Rt x 2, 20 sec holds, then x 1 on Lt but couldn't feel good stretch so stopped; done with towel around ankle, challenging for pt to hold due to UE weakness Bridge 2 x 5 HS stretch with strap x 3, 20 sec each, Bil Standing march no HH x 10, heel raises x 10 Standing scapular retraction with yellow band 2 x 10 Wall Push Ups 2 x 5 with gastroc stretches between sets Standing gastroc stretch at wall x 3 ea, 20 sec  11/12/22:Pt arrives for aquatic physical therapy. Treatment took place in 3.5-5.5 feet of water. Water temperature was 91 degrees F. Pt entered the pool via stairs with mild use of rails. Pt requires buoyancy of water for support and to offload joints with strengthening exercises.  Seated water bench with 75% submersion Pt performed seated LE AROM exercises 20x in all planes, with concurrent discussion of current status.  75% depth water walking with small noodle 4x each direction monitoring for excessive fatigue. Took 30 sec rest breaks today in between her direction changes. Standing with mitten hands core/lat press 10x with Pilates  breathing. Single knee to chest in same position 2x 3 breaths. High knee marching 20x. Bil hamstring stretching at stairs 2x 3 breaths followed by standing childs pose 3x 3 breaths. Pt did breast stroke 2x2 lenghts at end of session. Standing shoulder horizontal abd/add 2x10 with single buoy UE wts.   11/04/2022 Nu Step seat 6, UE 9, Lev 2 x 7 min, 377 steps Meeks decompression x 5 ea, 3 sec hold ppt x 5 Ppt with ball squeeze x 10 FABER stretch bilaterally x 3, 20 sec holds Standing incline stretch at parallel bars x 3 Standing march no HH x 10, heel raises x 10 Standing scapular retraction with yellow band x 10 Bridge x 5 HS stretch with strap x 3 B Standing gastroc stretch at wall x 3 ea, 20 sec  10/22/22:Pt arrives for aquatic physical therapy. Treatment took place in 3.5-5.5 feet of water. Water temperature was 90 degrees F. Pt entered the pool via stairs with mild use of rails. Pt requires buoyancy of water for support and to offload joints with strengthening exercises.  Seated water bench with 75% submersion Pt performed seated LE AROM exercises 20x in all planes, with concurrent discussion of current status. PTA educated pt in water principles but pt was already aware. She has been a swimmer in the past. 75% depth water walking with small noodle 4x each direction monitoring for excessive fatigue. No rest breaks needed. Semi sit against wall with single buoy wts core/lat press 2x6 with Pilates breathing. Single knee to chest in same position 2x 3 breaths. High knee marching holding small noodle 4 lengths. Bil hamstring stretching at stairs 2x 3 breaths followed by standing childs pose 3x 3 breaths. Pt did breast stroke 2 lenghts at end of session.   PATIENT EDUCATION:  Access Code: [email or web address] URL: [email or web address] Date: 11/04/2022 Prepared by: Alvira Monday  Exercises - Supine Pelvic Tilt  - 1 x daily - 7 x weekly - 1 sets - 10 reps - Supine Bridge  - 1 x daily - 7 x  weekly - 1 sets - 5 reps - Supine Piriformis Stretch  with Foot on Ground  - 1 x daily - 7 x weekly - 1 sets - 3 reps - 20 hold - Supine Hamstring Stretch with Strap  - 1 x daily - 7 x weekly - 1 sets - 3 reps - 20 hold - Gastroc Stretch on Wall  - 1 x daily - 7 x weekly - 1 sets - 3 reps - 20 hold Education details: POC, length of stay, Cancellation prn done day before Person educated: Patient Education method: Explanation Education comprehension: verbalized understanding  HOME EXERCISE PROGRAM: NA  ASSESSMENT:  CLINICAL IMPRESSION: Pt reports doing much better after last aquatics session. Today continued with NuStep increasing resistance to level 3 which pt reported felt good but did have to decrease mins due to increased muscle fatigue. Continued with gentle core strength and progression. Also included standing activities and wall push ups. Overall pt reports she felt good, if not fatigued, after session. Will cont to monitor how she does after sessions to gauge tolerance of activities at next session. Pt does report she is noticing some improvement with her endurance as she has been able to do a little more with ADLs before needing to rest.   OBJECTIVE IMPAIRMENTS: . decreased activity tolerance, decreased endurance, decreased knowledge of condition, difficulty walking, decreased ROM, decreased strength, increased edema, impaired UE functional use, impaired vision/preception, postural dysfunction, and pain.   ACTIVITY LIMITATIONS: carrying, lifting, bending, sitting, standing, sleeping, stairs, and reach over head   PARTICIPATION LIMITATIONS:  cleaning, community activity, and yard work   PERSONAL FACTORS: Behavior pattern, Time since onset of injury/illness/exacerbation, and 3+ comorbidities: Left breast cancer s/p chemo  are also affecting patient's functional outcome.   REHAB POTENTIAL: Fair    CLINICAL DECISION MAKING: Stable/uncomplicated  EVALUATION COMPLEXITY:  Low  GOALS: Goals reviewed with patient? Yes  SHORT TERM GOALS=LONG TERM GOALS: Target date: 6 weeks  Pt will be independent  and a tolerant of a HEP for gentle ROM/stretching Baseline: Goal status: INITIAL  2.  Pt will have improved strength of Bilateral LE hip flexion, IR and ER to at least 4/5 for improved safety  Baseline:  Goal status: INITIAL  3.  Pt will be able to shop for 25-50% longer periods of time without leaning over the shopping cart Baseline: Pt was able to shop after last aquatics session and go home and cook - 11/14/22 Goal status: PROGRESSING  4.  Pt will be tolerant of low level strengthening to improve core,and LE endurance and strength Baseline: Pt able to tolerate full PT session and mild progression from last session - 11/14/22 Goal status: MET  5.  LEFS will be increased to 35/80  demonstrate improved function.  Baseline:13/80  Goal status:INITIAL  6   Pt will be able to perform 5 bridges with 3 second hold to improve strength for bed mobility  Goal Status:  MET  7.  Pt will perform 5 sit to stands in 15 seconds to demonstrate improved strength /function  Baseline:2.6 seconds  Goal Status:INITIAL   PLAN:  PT FREQUENCY: 2x/week  PT DURATION: 8 weeks(only approved for 12 visits)  PLANNED INTERVENTIONS: Therapeutic exercises, Therapeutic activity, Neuromuscular re-education, Gait training, Patient/Family education, Self Care, Joint mobilization, Orthotic/Fit training, Dry Needling, Manual therapy, and Re-evaluation, aquatic therapy  PLAN FOR NEXT SESSION: Review STGs on next land appt. Assess sit to stand for goal assess. Check pt's fatigue level after todays session to make sure progression was appropriate.   Hermenia Bers, PTA 11/14/2022, 12:07 PM

## 2022-11-15 NOTE — Progress Notes (Signed)
Contacted via MyChart   Good morning Tonya Odonnell, your thyroid ultrasound returned and overall remains similar to previous.  Thyroid remains heterogeneous (meaning mixture of tissue - some inflammation), this is compatible with your known thyroid disease.  There are no nodules or masses noted.  Nothing concerning for cancer.  In soft tissue of neck there are some normal appearing lymph nodes.  Overall stable imaging.  Any questions? Keep being stellar!!  Thank you for allowing me to participate in your care.  I appreciate you. Kindest regards, Kenzli Barritt

## 2022-11-18 ENCOUNTER — Ambulatory Visit: Payer: Medicaid Other

## 2022-11-18 DIAGNOSIS — M25611 Stiffness of right shoulder, not elsewhere classified: Secondary | ICD-10-CM | POA: Diagnosis not present

## 2022-11-18 DIAGNOSIS — M25561 Pain in right knee: Secondary | ICD-10-CM | POA: Diagnosis not present

## 2022-11-18 DIAGNOSIS — G8929 Other chronic pain: Secondary | ICD-10-CM | POA: Diagnosis not present

## 2022-11-18 DIAGNOSIS — M6281 Muscle weakness (generalized): Secondary | ICD-10-CM | POA: Diagnosis not present

## 2022-11-18 DIAGNOSIS — R293 Abnormal posture: Secondary | ICD-10-CM | POA: Diagnosis not present

## 2022-11-18 DIAGNOSIS — M797 Fibromyalgia: Secondary | ICD-10-CM

## 2022-11-18 DIAGNOSIS — M5459 Other low back pain: Secondary | ICD-10-CM

## 2022-11-18 DIAGNOSIS — G894 Chronic pain syndrome: Secondary | ICD-10-CM

## 2022-11-18 DIAGNOSIS — Z853 Personal history of malignant neoplasm of breast: Secondary | ICD-10-CM | POA: Diagnosis not present

## 2022-11-18 DIAGNOSIS — M25552 Pain in left hip: Secondary | ICD-10-CM

## 2022-11-18 DIAGNOSIS — M25562 Pain in left knee: Secondary | ICD-10-CM | POA: Diagnosis not present

## 2022-11-18 DIAGNOSIS — M25612 Stiffness of left shoulder, not elsewhere classified: Secondary | ICD-10-CM | POA: Diagnosis not present

## 2022-11-18 NOTE — Therapy (Signed)
OUTPATIENT PHYSICAL THERAPY ONCOLOGY TREATMENT  Patient Name: Tonya Odonnell MRN: [id number] DOB:[birth year], 57 y.o., female Today's Date: 11/18/2022  END OF SESSION:  PT End of Session - 11/18/22 1154     Visit Number 7    Number of Visits 12    Date for PT Re-Evaluation 12/12/22    Authorization Type [employer]    Authorization Time Period 8/12-10/11/24    Authorization - Visit Number 6    Authorization - Number of Visits 12    PT Start Time 1205    PT Stop Time 1250    PT Time Calculation (min) 45 min    Activity Tolerance Patient tolerated treatment well;Patient limited by fatigue    Behavior During Therapy WFL for tasks assessed/performed               Past Medical History:  Diagnosis Date   Anemia    "USE TO HAVE IT,NOT NOW,2015,2016   Arthritis    HIPS,KNEES,HANDS   Breast cancer in female (HCC)    Cancer (HCC)    LEFT   Hyperlipidemia    Hypertension    Low iron    Neuromuscular disorder (HCC)    MUSCLE PAIN,FIBROMYALGIA   Osteopenia    PVC (premature ventricular contraction)    Thyroid disease    Past Surgical History:  Procedure Laterality Date   Left Ovarian removal Left    MASTECTOMY Left    "LYMPHNODES REMOVED   Patient Active Problem List   Diagnosis Date Noted   Iron deficiency anemia 09/01/2022   Hemorrhoids 08/14/2022   Lichen sclerosus of female genitalia 12/25/2021   Adrenal adenoma, left 09/17/2021   Pain management contract signed 06/13/2021   Chronic pain syndrome 06/03/2021   Chronic arthralgias of knees and hips 05/06/2021   Osteopenia of lumbar spine 05/05/2021   Obesity 05/05/2021   Anxiety 12/05/2020   Gallstones 12/05/2020   Gastroesophageal reflux disease without esophagitis 12/05/2020   Prediabetes 12/05/2020   Hyperlipidemia 02/08/2018   Hepatitis B core antibody positive 08/07/2017   History of breast cancer 12/13/2014   Chronic low back pain 02/17/2014   Fibromyalgia 10/26/2013   Adverse reaction to influenza  vaccine 11/22/2012   Essential (primary) hypertension 05/05/2012   Hashimoto's thyroiditis 05/05/2012      REFERRING PROVIDER: Aura Dials, NP  REFERRING DIAG: Chronic LBP, Fibomyalgia, hip and knee pain  THERAPY DIAG:  Muscle weakness (generalized)  Other low back pain  Pain in left hip  Chronic pain of both knees  Chronic pain disorder  Personal history of breast cancer  Fibromyalgia syndrome  ONSET DATE: 2016 with worsening of symptoms  Rationale for Evaluation and Treatment: Rehabilitation  SUBJECTIVE:  SUBJECTIVE STATEMENT:   I did well after last visit. I had a good workout last time, but I stayed at home for 3 days because I was so tired. I had an [place] of my thyroid and it just showed inflammation.    PERTINENT HISTORY:  Pt is s/p left mastectomy in 2016 with chemotherapy and hormonal therapy which was discontinued in 2022.[name] She was diagnosed with FMS a number of years ago. She complains of  diffuse body aches and retention of fluid. After she finished chemo in 2016  she was on Nortriptyline. When she stopped it she felt a flush of water in her body and since then she has pain in muscles and skin. When she exercises the next day she feels like she can't move. She has tried several different anti depressant medications. Sometimes ibuprofen helps as long as she only takes it 1x/day. She can't sit in chairs more than 20 minutes because it bothers her back. She has been trying to find out for 7 years what is going on. She has arthritis and muscle pain all together. She has a sharp knife like pain in her LB .She was given steroids and muscle relaxer. The back pain started to resolve and she was urinating 5-6 times at night.and she started feeling so much better. She was getting extremely tired  and just couldn't do much.  She is no longer on the meds and the pain is slowly starting to come back. She was diagnosed with Fibromyalgia after a swine flu vaccine. She got the vaccine on the left side and her left side of the body felt very different from the right. It got worse in 2015. After that they found the cancer in her LN's. She has swelling in the left UE, and had treatment in [place] with MLD and she has a compression sleeve but she doesn't wear it. She is not interested in being treated for lymphedema as she was treated in [place].[name]     PAIN:  Are you having pain? Yes NPRS scale: 7/10 Pain location: all over Pain orientation: Bilateral  PAIN TYPE: feel heavy like I am holding a lot of weight,fatigue Pain description: constant  Aggravating factors: its always there but worse with cleaning, cooking, up stairs, pulling weeds Relieving factors: better in the am, worse in evening  PRECAUTIONS: Left UE lymphedema, FMS, OA , poor vision   WEIGHT BEARING RESTRICTIONS: No  FALLS:  Has patient fallen in last 6 months? No  LIVING ENVIRONMENT: Lives with: lives with their son Lives in: House/apartment Has following equipment at home: Single point cane and Environmental consultant - 2 wheeled  OCCUPATION: Does not work  LEISURE: TV, Dealer  HAND DOMINANCE: right   PRIOR LEVEL OF FUNCTION: Independent  PATIENT GOALS: Decrease pain, greater ability to be active, clean home    OBJECTIVE:  COGNITION: Overall cognitive status: Within functional limits for tasks assessed   PALPATION: no tenderness with moderate palpation to UT, upper and LB, UE Fibromyalgia points; mainly hurts now with using her muscle massager. Improved since steroids in July  OBSERVATIONS / OTHER ASSESSMENTS: uses hands for rising from chair, wearing slippers because shoes hurt her skin  SENSATION: Light touch: Deficits     POSTURE:  forward head, rounded shoulders   UPPER EXTREMITY AROM/PROM:  A/PROM RIGHT    eval   Shoulder extension   Shoulder flexion   Shoulder abduction   Shoulder internal rotation   Shoulder external rotation     (Blank rows =  not tested)  A/PROM LEFT   eval  Shoulder extension   Shoulder flexion   Shoulder abduction   Shoulder internal rotation   Shoulder external rotation     (Blank rows = not tested)   LUMBAR:  FB   hands to mid lower leg  BB Decreased 70%, pain bilateral LBP Right SB; to knee: pain bilateral LB, then resolves, now I feel dizzy Left SB to knee: no pain     LOWER EXTREMITY AROM/PROM:  A/PROM Right eval  Hip flexion 3  Hip extension 1 bridge very fatigued  Hip abduction   Hip adduction   Hip internal rotation 4  Hip external rotation 4  Knee flexion 4  Knee extension 4  Ankle dorsiflexion 4+  Ankle plantarflexion 4  Ankle inversion   Ankle eversion    (Blank rows = not tested)  A/PROM LEFT eval  Hip flexion 3-  Hip extension 1 bridge very fatigued  Hip abduction 4  Hip adduction 4  Hip internal rotation 3+  Hip external rotation 3+  Knee flexion 4  Knee extension 4  Ankle dorsiflexion 4+  Ankle plantarflexion 4  Ankle inversion   Ankle eversion    (Blank rows = not tested)  LYMPHEDEMA ASSESSMENTS: Pt. Said she has a compression sleeve but doesn't wear it. She does not want to be treated for lymphedema.   SURGERY TYPE/DATE: Left Lumpectomy 12/2014 for Gr. 2 IDC, Mastectomy 01/2015 due to close margins  NUMBER OF LYMPH NODES REMOVED: 2/10  CHEMOTHERAPY: YES  RADIATION:NO  HORMONE TREATMENT: YES  INFECTIONS: NO  LYMPHEDEMA ASSESSMENTS: Pt has a compression sleeve but does not wear. She doesn't want to treat her lymphedema  LANDMARK RIGHT  eval  10 cm proximal to olecranon process   Olecranon process   10 cm proximal to ulnar styloid process   Just proximal to ulnar styloid process   Across hand at thumb web space   At base of 2nd digit   (Blank rows = not tested)  LANDMARK LEFT  eval  10 cm  proximal to olecranon process   Olecranon process   10 cm proximal to ulnar styloid process   Just proximal to ulnar styloid process   Across hand at thumb web space   At base of 2nd digit   (Blank rows = not tested)   FUNCTIONAL TESTS:  5x sit to stand  22.65 seconds  GAIT: Distance walked: front desk to room 8 Assistive device utilized: None Level of assistance: Complete Independence  LOWER Extremity Functional Scale; 13/80   TODAY'S TREATMENT:                                                                                                                                         DATE:   11/18/2022 Nu Step seat 6, UE 9, Lev 3 x 5 min  , 284 steps PPT x 10 PPT with  ball squeeze x 10, ppt with hip abd x 10, ppt with alternate arm raises 2x5, ppt with march 2 x 5 Bridging x 10 Bilateral piriformis stretch 3 x 20 sec Bilateral HS stretch with strap 3 x 20 sec Marching in bars;no HH Sidestepping in parallel bars, Forward and lateral step and hold on airex x 5 ea DLS on ax eyes open x 30 sec, Eyes closed 2 x 20 sec;CGA of PT,1 LOB corrected by PT/pt Scapular retraction x 10 yellow 11/14/22: Nu Step seat 6, UE 9, Lev 3 x 4 min, 173 steps; pt reports increased muscle fatigue so stopped Meeks decompression x 5 ea, 3 sec hold Post pelvic tilt x 15, 5 sec holds Ppt with following exs:  ball squeeze x 10, VC's to hold tilt throughout as pt struggled with this; tilt with alt UE flex x 5 each, tilt with clams (knees open/close) 3 x 5 Piriformis figure 4 stretch Rt x 2, 20 sec holds, then x 1 on Lt but couldn't feel good stretch so stopped; done with towel around ankle, challenging for pt to hold due to UE weakness Bridge 2 x 5 HS stretch with strap x 3, 20 sec each, Bil Standing march no HH x 10, heel raises x 10 Standing scapular retraction with yellow band 2 x 10 Wall Push Ups 2 x 5 with gastroc stretches between sets Standing gastroc stretch at wall x 3 ea, 20 sec   11/12/22:Pt  arrives for aquatic physical therapy. Treatment took place in 3.5-5.5 feet of water. Water temperature was 91 degrees F. Pt entered the pool via stairs with mild use of rails. Pt requires buoyancy of water for support and to offload joints with strengthening exercises.  Seated water bench with 75% submersion Pt performed seated LE AROM exercises 20x in all planes, with concurrent discussion of current status.  75% depth water walking with small noodle 4x each direction monitoring for excessive fatigue. Took 30 sec rest breaks today in between her direction changes. Standing with mitten hands core/lat press 10x with Pilates breathing. Single knee to chest in same position 2x 3 breaths. High knee marching 20x. Bil hamstring stretching at stairs 2x 3 breaths followed by standing childs pose 3x 3 breaths. Pt did breast stroke 2x2 lenghts at end of session. Standing shoulder horizontal abd/add 2x10 with single buoy UE wts.   11/04/2022 Nu Step seat 6, UE 9, Lev 2 x 7 min, 377 steps Meeks decompression x 5 ea, 3 sec hold ppt x 5 Ppt with ball squeeze x 10 FABER stretch bilaterally x 3, 20 sec holds Standing incline stretch at parallel bars x 3 Standing march no HH x 10, heel raises x 10 Standing scapular retraction with yellow band x 10 Bridge x 5 HS stretch with strap x 3 B Standing gastroc stretch at wall x 3 ea, 20 sec  10/22/22:Pt arrives for aquatic physical therapy. Treatment took place in 3.5-5.5 feet of water. Water temperature was 90 degrees F. Pt entered the pool via stairs with mild use of rails. Pt requires buoyancy of water for support and to offload joints with strengthening exercises.  Seated water bench with 75% submersion Pt performed seated LE AROM exercises 20x in all planes, with concurrent discussion of current status. PTA educated pt in water principles but pt was already aware. She has been a swimmer in the past. 75% depth water walking with small noodle 4x each direction monitoring  for excessive fatigue. No rest breaks needed. Semi sit  against wall with single buoy wts core/lat press 2x6 with Pilates breathing. Single knee to chest in same position 2x 3 breaths. High knee marching holding small noodle 4 lengths. Bil hamstring stretching at stairs 2x 3 breaths followed by standing childs pose 3x 3 breaths. Pt did breast stroke 2 lenghts at end of session.   PATIENT EDUCATION:  Access Code: [email or web address] URL: [email or web address] Date: 11/04/2022 Prepared by: Alvira Monday  Exercises - Supine Pelvic Tilt  - 1 x daily - 7 x weekly - 1 sets - 10 reps - Supine Bridge  - 1 x daily - 7 x weekly - 1 sets - 5 reps - Supine Piriformis Stretch with Foot on Ground  - 1 x daily - 7 x weekly - 1 sets - 3 reps - 20 hold - Supine Hamstring Stretch with Strap  - 1 x daily - 7 x weekly - 1 sets - 3 reps - 20 hold - Gastroc Stretch on Wall  - 1 x daily - 7 x weekly - 1 sets - 3 reps - 20 hold Education details: POC, length of stay, Cancellation prn done day before Person educated: Patient Education method: Explanation Education comprehension: verbalized understanding  HOME EXERCISE PROGRAM: NA  ASSESSMENT:  CLINICAL IMPRESSION:  Pt continues to be fatigued for several days after her appts, however, she feels like her muscles are getting stronger. She has improved ability to maintain her ppt during stabilization exercises. Left hip piriformis still tighter than right but visually appears to be improving. Balance activities challenging. OBJECTIVE IMPAIRMENTS: . decreased activity tolerance, decreased endurance, decreased knowledge of condition, difficulty walking, decreased ROM, decreased strength, increased edema, impaired UE functional use, impaired vision/preception, postural dysfunction, and pain.   ACTIVITY LIMITATIONS: carrying, lifting, bending, sitting, standing, sleeping, stairs, and reach over head   PARTICIPATION LIMITATIONS:  cleaning, community activity, and  yard work   PERSONAL FACTORS: Behavior pattern, Time since onset of injury/illness/exacerbation, and 3+ comorbidities: Left breast cancer s/p chemo  are also affecting patient's functional outcome.   REHAB POTENTIAL: Fair    CLINICAL DECISION MAKING: Stable/uncomplicated  EVALUATION COMPLEXITY: Low  GOALS: Goals reviewed with patient? Yes  SHORT TERM GOALS=LONG TERM GOALS: Target date: 6 weeks  Pt will be independent  and a tolerant of a HEP for gentle ROM/stretching Baseline: Goal status: INITIAL  2.  Pt will have improved strength of Bilateral LE hip flexion, IR and ER to at least 4/5 for improved safety  Baseline:  Goal status: INITIAL  3.  Pt will be able to shop for 25-50% longer periods of time without leaning over the shopping cart Baseline: Pt was able to shop after last aquatics session and go home and cook - 11/14/22 Goal status: PROGRESSING  4.  Pt will be tolerant of low level strengthening to improve core,and LE endurance and strength Baseline: Pt able to tolerate full PT session and mild progression from last session - 11/14/22 Goal status: MET  5.  LEFS will be increased to 35/80  demonstrate improved function.  Baseline:13/80  Goal status:INITIAL  6   Pt will be able to perform 5 bridges with 3 second hold to improve strength for bed mobility  Goal Status:  MET  7.  Pt will perform 5 sit to stands in 15 seconds to demonstrate improved strength /function  Baseline:2.6 seconds  Goal Status:INITIAL   PLAN:  PT FREQUENCY: 2x/week  PT DURATION: 8 weeks(only approved for 12 visits)  PLANNED INTERVENTIONS: Therapeutic exercises, Therapeutic activity, Neuromuscular re-education, Gait  training, Patient/Family education, Self Care, Joint mobilization, Orthotic/Fit training, Dry Needling, Manual therapy, and Re-evaluation, aquatic therapy  PLAN FOR NEXT SESSION: Review STGs on next land appt. Assess sit to stand for goal assess. Check pt's fatigue level after  todays session to make sure progression was appropriate.   Waynette Buttery, PT 11/18/2022, 12:59 PM

## 2022-11-21 ENCOUNTER — Encounter: Payer: Self-pay | Admitting: Physical Therapy

## 2022-11-21 ENCOUNTER — Ambulatory Visit: Payer: Medicaid Other | Admitting: Physical Therapy

## 2022-11-21 DIAGNOSIS — G894 Chronic pain syndrome: Secondary | ICD-10-CM

## 2022-11-21 DIAGNOSIS — M25562 Pain in left knee: Secondary | ICD-10-CM | POA: Diagnosis not present

## 2022-11-21 DIAGNOSIS — R293 Abnormal posture: Secondary | ICD-10-CM

## 2022-11-21 DIAGNOSIS — M5459 Other low back pain: Secondary | ICD-10-CM | POA: Diagnosis not present

## 2022-11-21 DIAGNOSIS — M25612 Stiffness of left shoulder, not elsewhere classified: Secondary | ICD-10-CM | POA: Diagnosis not present

## 2022-11-21 DIAGNOSIS — M25561 Pain in right knee: Secondary | ICD-10-CM | POA: Diagnosis not present

## 2022-11-21 DIAGNOSIS — M25611 Stiffness of right shoulder, not elsewhere classified: Secondary | ICD-10-CM | POA: Diagnosis not present

## 2022-11-21 DIAGNOSIS — Z853 Personal history of malignant neoplasm of breast: Secondary | ICD-10-CM | POA: Diagnosis not present

## 2022-11-21 DIAGNOSIS — G8929 Other chronic pain: Secondary | ICD-10-CM | POA: Diagnosis not present

## 2022-11-21 DIAGNOSIS — M797 Fibromyalgia: Secondary | ICD-10-CM

## 2022-11-21 DIAGNOSIS — M6281 Muscle weakness (generalized): Secondary | ICD-10-CM

## 2022-11-21 DIAGNOSIS — M25552 Pain in left hip: Secondary | ICD-10-CM

## 2022-11-21 NOTE — Therapy (Signed)
OUTPATIENT PHYSICAL THERAPY ONCOLOGY TREATMENT  Patient Name: Tonya Odonnell MRN: [id number] DOB:1965/05/26, 57 y.o., female Today's Date: 11/21/2022  END OF SESSION:  PT End of Session - 11/21/22 2143     Visit Number 8    Number of Visits 12    Date for PT Re-Evaluation 12/12/22    Authorization Type [employer]    Authorization Time Period 8/12-10/11/24    Authorization - Visit Number 7    Authorization - Number of Visits 12    PT Start Time 1300    PT Stop Time 1345    PT Time Calculation (min) 45 min    Activity Tolerance Patient tolerated treatment well    Behavior During Therapy WFL for tasks assessed/performed                Past Medical History:  Diagnosis Date   Anemia    "USE TO HAVE IT,NOT NOW,2015,2016   Arthritis    HIPS,KNEES,HANDS   Breast cancer in female (HCC)    Cancer (HCC)    LEFT   Hyperlipidemia    Hypertension    Low iron    Neuromuscular disorder (HCC)    MUSCLE PAIN,FIBROMYALGIA   Osteopenia    PVC (premature ventricular contraction)    Thyroid disease    Past Surgical History:  Procedure Laterality Date   Left Ovarian removal Left    MASTECTOMY Left    "LYMPHNODES REMOVED   Patient Active Problem List   Diagnosis Date Noted   Iron deficiency anemia 09/01/2022   Hemorrhoids 08/14/2022   Lichen sclerosus of female genitalia 12/25/2021   Adrenal adenoma, left 09/17/2021   Pain management contract signed 06/13/2021   Chronic pain syndrome 06/03/2021   Chronic arthralgias of knees and hips 05/06/2021   Osteopenia of lumbar spine 05/05/2021   Obesity 05/05/2021   Anxiety 12/05/2020   Gallstones 12/05/2020   Gastroesophageal reflux disease without esophagitis 12/05/2020   Prediabetes 12/05/2020   Hyperlipidemia 02/08/2018   Hepatitis B core antibody positive 08/07/2017   History of breast cancer 12/13/2014   Chronic low back pain 02/17/2014   Fibromyalgia 10/26/2013   Adverse reaction to influenza vaccine 11/22/2012    Essential (primary) hypertension 05/05/2012   Hashimoto's thyroiditis 05/05/2012      REFERRING PROVIDER: Aura Dials, NP  REFERRING DIAG: Chronic LBP, Fibomyalgia, hip and knee pain  THERAPY DIAG:  Muscle weakness (generalized)  Other low back pain  Pain in left hip  Chronic pain of both knees  Chronic pain disorder  Personal history of breast cancer  Fibromyalgia syndrome  Stiffness of right shoulder, not elsewhere classified  Abnormal posture  Stiffness of left shoulder, not elsewhere classified  ONSET DATE: 2016 with worsening of symptoms  Rationale for Evaluation and Treatment: Rehabilitation  SUBJECTIVE:  SUBJECTIVE STATEMENT: I was able to do 3 additional activities after my last pool session. That was awesome! The pool really helps me.      PERTINENT HISTORY:  Pt is s/p left mastectomy in 2016 with chemotherapy and hormonal therapy which was discontinued in 2022.[name] She was diagnosed with FMS a number of years ago. She complains of  diffuse body aches and retention of fluid. After she finished chemo in 2016  she was on Nortriptyline. When she stopped it she felt a flush of water in her body and since then she has pain in muscles and skin. When she exercises the next day she feels like she can't move. She has tried several different anti depressant medications. Sometimes ibuprofen helps as long as she only takes it 1x/day. She can't sit in chairs more than 20 minutes because it bothers her back. She has been trying to find out for 7 years what is going on. She has arthritis and muscle pain all together. She has a sharp knife like pain in her LB .She was given steroids and muscle relaxer. The back pain started to resolve and she was urinating 5-6 times at night.and she started feeling  so much better. She was getting extremely tired and just couldn't do much.  She is no longer on the meds and the pain is slowly starting to come back. She was diagnosed with Fibromyalgia after a swine flu vaccine. She got the vaccine on the left side and her left side of the body felt very different from the right. It got worse in 2015. After that they found the cancer in her LN's. She has swelling in the left UE, and had treatment in [place] with MLD and she has a compression sleeve but she doesn't wear it. She is not interested in being treated for lymphedema as she was treated in [place].[name]     PAIN:  Are you having pain? Yes NPRS scale: 6/10 Pain location: all over Pain orientation: Bilateral  PAIN TYPE: feel heavy like I am holding a lot of weight,fatigue Pain description: constant  Aggravating factors: its always there but worse with cleaning, cooking, up stairs, pulling weeds Relieving factors: better in the am, worse in evening  PRECAUTIONS: Left UE lymphedema, FMS, OA , poor vision   WEIGHT BEARING RESTRICTIONS: No  FALLS:  Has patient fallen in last 6 months? No  LIVING ENVIRONMENT: Lives with: lives with their son Lives in: House/apartment Has following equipment at home: Single point cane and Environmental consultant - 2 wheeled  OCCUPATION: Does not work  LEISURE: TV, Dealer  HAND DOMINANCE: right   PRIOR LEVEL OF FUNCTION: Independent  PATIENT GOALS: Decrease pain, greater ability to be active, clean home    OBJECTIVE:  COGNITION: Overall cognitive status: Within functional limits for tasks assessed   PALPATION: no tenderness with moderate palpation to UT, upper and LB, UE Fibromyalgia points; mainly hurts now with using her muscle massager. Improved since steroids in July  OBSERVATIONS / OTHER ASSESSMENTS: uses hands for rising from chair, wearing slippers because shoes hurt her skin  SENSATION: Light touch: Deficits     POSTURE:  forward head, rounded shoulders    UPPER EXTREMITY AROM/PROM:  A/PROM RIGHT   eval   Shoulder extension   Shoulder flexion   Shoulder abduction   Shoulder internal rotation   Shoulder external rotation     (Blank rows = not tested)  A/PROM LEFT   eval  Shoulder extension   Shoulder flexion  Shoulder abduction   Shoulder internal rotation   Shoulder external rotation     (Blank rows = not tested)   LUMBAR:  FB   hands to mid lower leg  BB Decreased 70%, pain bilateral LBP Right SB; to knee: pain bilateral LB, then resolves, now I feel dizzy Left SB to knee: no pain     LOWER EXTREMITY AROM/PROM:  A/PROM Right eval  Hip flexion 3  Hip extension 1 bridge very fatigued  Hip abduction   Hip adduction   Hip internal rotation 4  Hip external rotation 4  Knee flexion 4  Knee extension 4  Ankle dorsiflexion 4+  Ankle plantarflexion 4  Ankle inversion   Ankle eversion    (Blank rows = not tested)  A/PROM LEFT eval  Hip flexion 3-  Hip extension 1 bridge very fatigued  Hip abduction 4  Hip adduction 4  Hip internal rotation 3+  Hip external rotation 3+  Knee flexion 4  Knee extension 4  Ankle dorsiflexion 4+  Ankle plantarflexion 4  Ankle inversion   Ankle eversion    (Blank rows = not tested)  LYMPHEDEMA ASSESSMENTS: Pt. Said she has a compression sleeve but doesn't wear it. She does not want to be treated for lymphedema.   SURGERY TYPE/DATE: Left Lumpectomy 12/2014 for Gr. 2 IDC, Mastectomy 01/2015 due to close margins  NUMBER OF LYMPH NODES REMOVED: 2/10  CHEMOTHERAPY: YES  RADIATION:NO  HORMONE TREATMENT: YES  INFECTIONS: NO  LYMPHEDEMA ASSESSMENTS: Pt has a compression sleeve but does not wear. She doesn't want to treat her lymphedema  LANDMARK RIGHT  eval  10 cm proximal to olecranon process   Olecranon process   10 cm proximal to ulnar styloid process   Just proximal to ulnar styloid process   Across hand at thumb web space   At base of 2nd digit   (Blank rows =  not tested)  LANDMARK LEFT  eval  10 cm proximal to olecranon process   Olecranon process   10 cm proximal to ulnar styloid process   Just proximal to ulnar styloid process   Across hand at thumb web space   At base of 2nd digit   (Blank rows = not tested)   FUNCTIONAL TESTS:  5x sit to stand  22.65 seconds  GAIT: Distance walked: front desk to room 8 Assistive device utilized: None Level of assistance: Complete Independence  LOWER Extremity Functional Scale; 13/80   TODAY'S TREATMENT:                                                                                                                                         DATE:   11/21/22:Pt arrives for aquatic physical therapy. Treatment took place in 3.5-5.5 feet of water. Water temperature was 91 degrees F. Pt entered the pool via stairs with mild use of rails. Pt requires buoyancy of water for support  and to offload joints with strengthening exercises.  Seated water bench with 75% submersion Pt performed seated LE AROM exercises 20x in all planes, with concurrent discussion of current status.  75% depth water walking with small noodle 6x each direction monitoring for excessive fatigue. Took 30 sec rest breaks today in between her direction changes. Standing with single buoy float core/lat press 10x with Pilates breathing. Single knee to chest in same position 2x 3 breaths. High knee marching 20x. Bil hamstring stretching at stairs 2x 3 breaths followed by standing childs pose 3x 3 breaths. Standing single leg knee ext with teal noodle 10x Bil.  Pt did breast stroke 3x2 lenghts at end of session. Standing shoulder horizontal abd/add 2x10 with single buoy UE wts. Seated decompression 5 min post.   11/18/2022 Nu Step seat 6, UE 9, Lev 3 x 5 min  , 284 steps PPT x 10 PPT with ball squeeze x 10, ppt with hip abd x 10, ppt with alternate arm raises 2x5, ppt with march 2 x 5 Bridging x 10 Bilateral piriformis stretch 3 x 20 sec Bilateral  HS stretch with strap 3 x 20 sec Marching in bars;no HH Sidestepping in parallel bars, Forward and lateral step and hold on airex x 5 ea DLS on ax eyes open x 30 sec, Eyes closed 2 x 20 sec;CGA of PT,1 LOB corrected by PT/pt Scapular retraction x 10 yellow 11/14/22: Nu Step seat 6, UE 9, Lev 3 x 4 min, 173 steps; pt reports increased muscle fatigue so stopped Meeks decompression x 5 ea, 3 sec hold Post pelvic tilt x 15, 5 sec holds Ppt with following exs:  ball squeeze x 10, VC's to hold tilt throughout as pt struggled with this; tilt with alt UE flex x 5 each, tilt with clams (knees open/close) 3 x 5 Piriformis figure 4 stretch Rt x 2, 20 sec holds, then x 1 on Lt but couldn't feel good stretch so stopped; done with towel around ankle, challenging for pt to hold due to UE weakness Bridge 2 x 5 HS stretch with strap x 3, 20 sec each, Bil Standing march no HH x 10, heel raises x 10 Standing scapular retraction with yellow band 2 x 10 Wall Push Ups 2 x 5 with gastroc stretches between sets Standing gastroc stretch at wall x 3 ea, 20 sec   11/12/22:Pt arrives for aquatic physical therapy. Treatment took place in 3.5-5.5 feet of water. Water temperature was 91 degrees F. Pt entered the pool via stairs with mild use of rails. Pt requires buoyancy of water for support and to offload joints with strengthening exercises.  Seated water bench with 75% submersion Pt performed seated LE AROM exercises 20x in all planes, with concurrent discussion of current status.  75% depth water walking with small noodle 4x each direction monitoring for excessive fatigue. Took 30 sec rest breaks today in between her direction changes. Standing with mitten hands core/lat press 10x with Pilates breathing. Single knee to chest in same position 2x 3 breaths. High knee marching 20x. Bil hamstring stretching at stairs 2x 3 breaths followed by standing childs pose 3x 3 breaths. Pt did breast stroke 2x2 lenghts at end of session.  Standing shoulder horizontal abd/add 2x10 with single buoy UE wts.   PATIENT EDUCATION:  Access Code: [email or web address] URL: [email or web address] Date: 11/04/2022 Prepared by: Alvira Monday  Exercises - Supine Pelvic Tilt  - 1 x daily - 7 x weekly - 1 sets - 10 reps - Supine  Bridge  - 1 x daily - 7 x weekly - 1 sets - 5 reps - Supine Piriformis Stretch with Foot on Ground  - 1 x daily - 7 x weekly - 1 sets - 3 reps - 20 hold - Supine Hamstring Stretch with Strap  - 1 x daily - 7 x weekly - 1 sets - 3 reps - 20 hold - Gastroc Stretch on Wall  - 1 x daily - 7 x weekly - 1 sets - 3 reps - 20 hold Education details: POC, length of stay, Cancellation prn done day before Person educated: Patient Education method: Explanation Education comprehension: verbalized understanding  HOME EXERCISE PROGRAM: NA  ASSESSMENT:  CLINICAL IMPRESSION: Pt really improving her stamina with her aquatic exercises which may be beginning to have a positive effect on her activities. Pt continues to increase her work load incluidng 2 extra laps of breast stroke swimming. Pt absolutley loves moving in the water.  OBJECTIVE IMPAIRMENTS: . decreased activity tolerance, decreased endurance, decreased knowledge of condition, difficulty walking, decreased ROM, decreased strength, increased edema, impaired UE functional use, impaired vision/preception, postural dysfunction, and pain.   ACTIVITY LIMITATIONS: carrying, lifting, bending, sitting, standing, sleeping, stairs, and reach over head   PARTICIPATION LIMITATIONS:  cleaning, community activity, and yard work   PERSONAL FACTORS: Behavior pattern, Time since onset of injury/illness/exacerbation, and 3+ comorbidities: Left breast cancer s/p chemo  are also affecting patient's functional outcome.   REHAB POTENTIAL: Fair    CLINICAL DECISION MAKING: Stable/uncomplicated  EVALUATION COMPLEXITY: Low  GOALS: Goals reviewed with patient? Yes  SHORT TERM  GOALS=LONG TERM GOALS: Target date: 6 weeks  Pt will be independent  and a tolerant of a HEP for gentle ROM/stretching Baseline: Goal status: INITIAL  2.  Pt will have improved strength of Bilateral LE hip flexion, IR and ER to at least 4/5 for improved safety  Baseline:  Goal status: INITIAL  3.  Pt will be able to shop for 25-50% longer periods of time without leaning over the shopping cart Baseline: Pt was able to shop after last aquatics session and go home and cook - 11/14/22 Goal status: PROGRESSING  4.  Pt will be tolerant of low level strengthening to improve core,and LE endurance and strength Baseline: Pt able to tolerate full PT session and mild progression from last session - 11/14/22 Goal status: MET  5.  LEFS will be increased to 35/80  demonstrate improved function.  Baseline:13/80  Goal status:INITIAL  6   Pt will be able to perform 5 bridges with 3 second hold to improve strength for bed mobility  Goal Status:  MET  7.  Pt will perform 5 sit to stands in 15 seconds to demonstrate improved strength /function  Baseline:2.6 seconds  Goal Status:INITIAL   PLAN:  PT FREQUENCY: 2x/week  PT DURATION: 8 weeks(only approved for 12 visits)  PLANNED INTERVENTIONS: Therapeutic exercises, Therapeutic activity, Neuromuscular re-education, Gait training, Patient/Family education, Self Care, Joint mobilization, Orthotic/Fit training, Dry Needling, Manual therapy, and Re-evaluation, aquatic therapy  PLAN FOR NEXT SESSION: Review STGs on next land appt. Assess sit to stand for goal assess. Check pt's fatigue level after todays session to make sure progression was appropriate.   Kaely Hollan, PTA 11/21/2022, 9:45 PM

## 2022-11-25 ENCOUNTER — Ambulatory Visit: Payer: Medicaid Other

## 2022-11-28 ENCOUNTER — Ambulatory Visit: Payer: Medicaid Other | Admitting: Physical Therapy

## 2022-12-01 ENCOUNTER — Ambulatory Visit: Payer: Medicaid Other

## 2022-12-01 ENCOUNTER — Telehealth: Payer: Self-pay

## 2022-12-01 DIAGNOSIS — H5213 Myopia, bilateral: Secondary | ICD-10-CM | POA: Diagnosis not present

## 2022-12-01 NOTE — Telephone Encounter (Signed)
Called pt. Secondary to no Show. She didn't realize that she had an appt because she didn't get any alert like she had been getting previously so she missed her pool appt last week and this one this week. She indicated that someone usually calls her because she forgets her appts. I suggested she check her appts in my chart and write on a calendar. She is trying to call her MD to see if she requires more appts to get an MRI. Advised her of next appt at pool and suggested she call Tawni Carnes to get an appt with me for recert.

## 2022-12-03 ENCOUNTER — Encounter: Payer: Self-pay | Admitting: Physical Therapy

## 2022-12-03 ENCOUNTER — Ambulatory Visit: Payer: Medicaid Other | Admitting: Physical Therapy

## 2022-12-03 DIAGNOSIS — M6281 Muscle weakness (generalized): Secondary | ICD-10-CM | POA: Diagnosis not present

## 2022-12-03 DIAGNOSIS — M797 Fibromyalgia: Secondary | ICD-10-CM | POA: Diagnosis not present

## 2022-12-03 DIAGNOSIS — R293 Abnormal posture: Secondary | ICD-10-CM | POA: Diagnosis not present

## 2022-12-03 DIAGNOSIS — G894 Chronic pain syndrome: Secondary | ICD-10-CM | POA: Diagnosis not present

## 2022-12-03 DIAGNOSIS — G8929 Other chronic pain: Secondary | ICD-10-CM | POA: Diagnosis not present

## 2022-12-03 DIAGNOSIS — M5459 Other low back pain: Secondary | ICD-10-CM | POA: Diagnosis not present

## 2022-12-03 DIAGNOSIS — M25612 Stiffness of left shoulder, not elsewhere classified: Secondary | ICD-10-CM | POA: Diagnosis not present

## 2022-12-03 DIAGNOSIS — Z853 Personal history of malignant neoplasm of breast: Secondary | ICD-10-CM | POA: Diagnosis not present

## 2022-12-03 DIAGNOSIS — M25611 Stiffness of right shoulder, not elsewhere classified: Secondary | ICD-10-CM | POA: Diagnosis not present

## 2022-12-03 DIAGNOSIS — M25552 Pain in left hip: Secondary | ICD-10-CM

## 2022-12-03 DIAGNOSIS — M25562 Pain in left knee: Secondary | ICD-10-CM | POA: Diagnosis not present

## 2022-12-03 DIAGNOSIS — M25561 Pain in right knee: Secondary | ICD-10-CM | POA: Diagnosis not present

## 2022-12-03 NOTE — Therapy (Signed)
OUTPATIENT PHYSICAL THERAPY ONCOLOGY TREATMENT  Patient Name: Tonya Odonnell MRN: [id number] DOB:[birth year], 57 y.o., female Today's Date: 12/03/2022  END OF SESSION:  PT End of Session - 12/03/22 1019     Visit Number 9    Number of Visits 12    Date for PT Re-Evaluation 12/12/22    Authorization Type [employer]    Authorization Time Period 8/12-10/11/24    Authorization - Visit Number 8    Authorization - Number of Visits 12    PT Start Time 1019    PT Stop Time 1100    PT Time Calculation (min) 41 min    Activity Tolerance Patient tolerated treatment well;Patient limited by fatigue;Patient limited by lethargy    Behavior During Therapy WFL for tasks assessed/performed                Past Medical History:  Diagnosis Date   Anemia    "USE TO HAVE IT,NOT NOW,2015,2016   Arthritis    HIPS,KNEES,HANDS   Breast cancer in female (HCC)    Cancer (HCC)    LEFT   Hyperlipidemia    Hypertension    Low iron    Neuromuscular disorder (HCC)    MUSCLE PAIN,FIBROMYALGIA   Osteopenia    PVC (premature ventricular contraction)    Thyroid disease    Past Surgical History:  Procedure Laterality Date   Left Ovarian removal Left    MASTECTOMY Left    "LYMPHNODES REMOVED   Patient Active Problem List   Diagnosis Date Noted   Iron deficiency anemia 09/01/2022   Hemorrhoids 08/14/2022   Lichen sclerosus of female genitalia 12/25/2021   Adrenal adenoma, left 09/17/2021   Pain management contract signed 06/13/2021   Chronic pain syndrome 06/03/2021   Chronic arthralgias of knees and hips 05/06/2021   Osteopenia of lumbar spine 05/05/2021   Obesity 05/05/2021   Anxiety 12/05/2020   Gallstones 12/05/2020   Gastroesophageal reflux disease without esophagitis 12/05/2020   Prediabetes 12/05/2020   Hyperlipidemia 02/08/2018   Hepatitis B core antibody positive 08/07/2017   History of breast cancer 12/13/2014   Chronic low back pain 02/17/2014   Fibromyalgia 10/26/2013    Adverse reaction to influenza vaccine 11/22/2012   Essential (primary) hypertension 05/05/2012   Hashimoto's thyroiditis 05/05/2012      REFERRING PROVIDER: Aura Dials, NP  REFERRING DIAG: Chronic LBP, Fibomyalgia, hip and knee pain  THERAPY DIAG:  Muscle weakness (generalized)  Other low back pain  Pain in left hip  Chronic pain of both knees  Chronic pain disorder  Personal history of breast cancer  Fibromyalgia syndrome  Stiffness of right shoulder, not elsewhere classified  Abnormal posture  Stiffness of left shoulder, not elsewhere classified  ONSET DATE: 2016 with worsening of symptoms  Rationale for Evaluation and Treatment: Rehabilitation  SUBJECTIVE:  SUBJECTIVE STATEMENT: I am not having a very good day. My chest is sore and Im not sure I want to stay. Pt agreed to try a small amount to see if it in fact made her feel better.     PERTINENT HISTORY:  Pt is s/p left mastectomy in 2016 with chemotherapy and hormonal therapy which was discontinued in 2022.[name] She was diagnosed with FMS a number of years ago. She complains of  diffuse body aches and retention of fluid. After she finished chemo in 2016  she was on Nortriptyline. When she stopped it she felt a flush of water in her body and since then she has pain in muscles and skin. When she exercises the next day she feels like she can't move. She has tried several different anti depressant medications. Sometimes ibuprofen helps as long as she only takes it 1x/day. She can't sit in chairs more than 20 minutes because it bothers her back. She has been trying to find out for 7 years what is going on. She has arthritis and muscle pain all together. She has a sharp knife like pain in her LB .She was given steroids and muscle relaxer. The  back pain started to resolve and she was urinating 5-6 times at night.and she started feeling so much better. She was getting extremely tired and just couldn't do much.  She is no longer on the meds and the pain is slowly starting to come back. She was diagnosed with Fibromyalgia after a swine flu vaccine. She got the vaccine on the left side and her left side of the body felt very different from the right. It got worse in 2015. After that they found the cancer in her LN's. She has swelling in the left UE, and had treatment in [place] with MLD and she has a compression sleeve but she doesn't wear it. She is not interested in being treated for lymphedema as she was treated in [place].[name]     PAIN:  Are you having pain? Yes NPRS scale: 8/10 Pain location: all over Pain orientation: Bilateral  PAIN TYPE: feel heavy like I am holding a lot of weight,fatigue Pain description: constant  Aggravating factors: its always there but worse with cleaning, cooking, up stairs, pulling weeds Relieving factors: better in the am, worse in evening  PRECAUTIONS: Left UE lymphedema, FMS, OA , poor vision   WEIGHT BEARING RESTRICTIONS: No  FALLS:  Has patient fallen in last 6 months? No  LIVING ENVIRONMENT: Lives with: lives with their son Lives in: House/apartment Has following equipment at home: Single point cane and Environmental consultant - 2 wheeled  OCCUPATION: Does not work  LEISURE: TV, Dealer  HAND DOMINANCE: right   PRIOR LEVEL OF FUNCTION: Independent  PATIENT GOALS: Decrease pain, greater ability to be active, clean home    OBJECTIVE:  COGNITION: Overall cognitive status: Within functional limits for tasks assessed   PALPATION: no tenderness with moderate palpation to UT, upper and LB, UE Fibromyalgia points; mainly hurts now with using her muscle massager. Improved since steroids in July  OBSERVATIONS / OTHER ASSESSMENTS: uses hands for rising from chair, wearing slippers because shoes hurt  her skin  SENSATION: Light touch: Deficits     POSTURE:  forward head, rounded shoulders   UPPER EXTREMITY AROM/PROM:  A/PROM RIGHT   eval   Shoulder extension   Shoulder flexion   Shoulder abduction   Shoulder internal rotation   Shoulder external rotation     (Blank rows = not  tested)  A/PROM LEFT   eval  Shoulder extension   Shoulder flexion   Shoulder abduction   Shoulder internal rotation   Shoulder external rotation     (Blank rows = not tested)   LUMBAR:  FB   hands to mid lower leg  BB Decreased 70%, pain bilateral LBP Right SB; to knee: pain bilateral LB, then resolves, now I feel dizzy Left SB to knee: no pain     LOWER EXTREMITY AROM/PROM:  A/PROM Right eval  Hip flexion 3  Hip extension 1 bridge very fatigued  Hip abduction   Hip adduction   Hip internal rotation 4  Hip external rotation 4  Knee flexion 4  Knee extension 4  Ankle dorsiflexion 4+  Ankle plantarflexion 4  Ankle inversion   Ankle eversion    (Blank rows = not tested)  A/PROM LEFT eval  Hip flexion 3-  Hip extension 1 bridge very fatigued  Hip abduction 4  Hip adduction 4  Hip internal rotation 3+  Hip external rotation 3+  Knee flexion 4  Knee extension 4  Ankle dorsiflexion 4+  Ankle plantarflexion 4  Ankle inversion   Ankle eversion    (Blank rows = not tested)  LYMPHEDEMA ASSESSMENTS: Pt. Said she has a compression sleeve but doesn't wear it. She does not want to be treated for lymphedema.   SURGERY TYPE/DATE: Left Lumpectomy 12/2014 for Gr. 2 IDC, Mastectomy 01/2015 due to close margins  NUMBER OF LYMPH NODES REMOVED: 2/10  CHEMOTHERAPY: YES  RADIATION:NO  HORMONE TREATMENT: YES  INFECTIONS: NO  LYMPHEDEMA ASSESSMENTS: Pt has a compression sleeve but does not wear. She doesn't want to treat her lymphedema  LANDMARK RIGHT  eval  10 cm proximal to olecranon process   Olecranon process   10 cm proximal to ulnar styloid process   Just proximal to  ulnar styloid process   Across hand at thumb web space   At base of 2nd digit   (Blank rows = not tested)  LANDMARK LEFT  eval  10 cm proximal to olecranon process   Olecranon process   10 cm proximal to ulnar styloid process   Just proximal to ulnar styloid process   Across hand at thumb web space   At base of 2nd digit   (Blank rows = not tested)   FUNCTIONAL TESTS:  5x sit to stand  22.65 seconds  GAIT: Distance walked: front desk to room 8 Assistive device utilized: None Level of assistance: Complete Independence  LOWER Extremity Functional Scale; 13/80   TODAY'S TREATMENT:                                                                                                                                         DATE:  12/03/22: Pt arrives for aquatic physical therapy. Treatment took place in 3.5-5.5 feet of water. Water temperature was 91 degrees F. Pt entered  the pool via stairs independently. Pt requires buoyancy of water for support and to offload joints with strengthening exercises.  Seated water bench with 75% submersion Pt performed seated LE AROM exercises 20x in all planes, discussed her symptoms.  75% depth water walking with natural arms 4x in each direction with 2 min seated rest break. Semi seated arm extensions focusing on core 20x. Hip 3 way kicks Bil 10x slow with support. Pt was pleased with this level of exercise today.  11/21/22:Pt arrives for aquatic physical therapy. Treatment took place in 3.5-5.5 feet of water. Water temperature was 91 degrees F. Pt entered the pool via stairs with mild use of rails. Pt requires buoyancy of water for support and to offload joints with strengthening exercises.  Seated water bench with 75% submersion Pt performed seated LE AROM exercises 20x in all planes, with concurrent discussion of current status.  75% depth water walking with small noodle 6x each direction monitoring for excessive fatigue. Took 30 sec rest breaks today in  between her direction changes. Standing with single buoy float core/lat press 10x with Pilates breathing. Single knee to chest in same position 2x 3 breaths. High knee marching 20x. Bil hamstring stretching at stairs 2x 3 breaths followed by standing childs pose 3x 3 breaths. Standing single leg knee ext with teal noodle 10x Bil.  Pt did breast stroke 3x2 lenghts at end of session. Standing shoulder horizontal abd/add 2x10 with single buoy UE wts. Seated decompression 5 min post.   11/18/2022 Nu Step seat 6, UE 9, Lev 3 x 5 min  , 284 steps PPT x 10 PPT with ball squeeze x 10, ppt with hip abd x 10, ppt with alternate arm raises 2x5, ppt with march 2 x 5 Bridging x 10 Bilateral piriformis stretch 3 x 20 sec Bilateral HS stretch with strap 3 x 20 sec Marching in bars;no HH Sidestepping in parallel bars, Forward and lateral step and hold on airex x 5 ea DLS on ax eyes open x 30 sec, Eyes closed 2 x 20 sec;CGA of PT,1 LOB corrected by PT/pt Scapular retraction x 10 yellow  PATIENT EDUCATION:  Access Code: [email or web address] URL: [email or web address] Date: 11/04/2022 Prepared by: Alvira Monday  Exercises - Supine Pelvic Tilt  - 1 x daily - 7 x weekly - 1 sets - 10 reps - Supine Bridge  - 1 x daily - 7 x weekly - 1 sets - 5 reps - Supine Piriformis Stretch with Foot on Ground  - 1 x daily - 7 x weekly - 1 sets - 3 reps - 20 hold - Supine Hamstring Stretch with Strap  - 1 x daily - 7 x weekly - 1 sets - 3 reps - 20 hold - Gastroc Stretch on Wall  - 1 x daily - 7 x weekly - 1 sets - 3 reps - 20 hold Education details: POC, length of stay, Cancellation prn done day before Person educated: Patient Education method: Explanation Education comprehension: verbalized understanding  HOME EXERCISE PROGRAM: NA  ASSESSMENT:  CLINICAL IMPRESSION: Pt arrives to aquatic PT not feeling very well, widespread pain reports today. Overall volume of exercise decreased, pt with excellent effort feeling  as bad as she did.   OBJECTIVE IMPAIRMENTS: . decreased activity tolerance, decreased endurance, decreased knowledge of condition, difficulty walking, decreased ROM, decreased strength, increased edema, impaired UE functional use, impaired vision/preception, postural dysfunction, and pain.   ACTIVITY LIMITATIONS: carrying, lifting, bending, sitting, standing, sleeping, stairs, and reach over head  PARTICIPATION LIMITATIONS:  cleaning, community activity, and yard work   PERSONAL FACTORS: Behavior pattern, Time since onset of injury/illness/exacerbation, and 3+ comorbidities: Left breast cancer s/p chemo  are also affecting patient's functional outcome.   REHAB POTENTIAL: Fair    CLINICAL DECISION MAKING: Stable/uncomplicated  EVALUATION COMPLEXITY: Low  GOALS: Goals reviewed with patient? Yes  SHORT TERM GOALS=LONG TERM GOALS: Target date: 6 weeks  Pt will be independent  and a tolerant of a HEP for gentle ROM/stretching Baseline: Goal status: INITIAL  2.  Pt will have improved strength of Bilateral LE hip flexion, IR and ER to at least 4/5 for improved safety  Baseline:  Goal status: INITIAL  3.  Pt will be able to shop for 25-50% longer periods of time without leaning over the shopping cart Baseline: Pt was able to shop after last aquatics session and go home and cook - 11/14/22 Goal status: PROGRESSING  4.  Pt will be tolerant of low level strengthening to improve core,and LE endurance and strength Baseline: Pt able to tolerate full PT session and mild progression from last session - 11/14/22 Goal status: MET  5.  LEFS will be increased to 35/80  demonstrate improved function.  Baseline:13/80  Goal status:INITIAL  6   Pt will be able to perform 5 bridges with 3 second hold to improve strength for bed mobility  Goal Status:  MET  7.  Pt will perform 5 sit to stands in 15 seconds to demonstrate improved strength /function  Baseline:2.6 seconds  Goal  Status:INITIAL   PLAN:  PT FREQUENCY: 2x/week  PT DURATION: 8 weeks(only approved for 12 visits)  PLANNED INTERVENTIONS: Therapeutic exercises, Therapeutic activity, Neuromuscular re-education, Gait training, Patient/Family education, Self Care, Joint mobilization, Orthotic/Fit training, Dry Needling, Manual therapy, and Re-evaluation, aquatic therapy  PLAN FOR NEXT SESSION: Review STGs on next land appt. Assess sit to stand for goal assess. Check pt's fatigue level after todays session to make sure progression was appropriate.   Eren Ryser, PTA 12/03/2022, 7:59 PM

## 2022-12-05 ENCOUNTER — Ambulatory Visit: Payer: Medicaid Other | Admitting: Physical Therapy

## 2022-12-07 NOTE — Patient Instructions (Incomplete)

## 2022-12-09 ENCOUNTER — Ambulatory Visit: Payer: Medicaid Other | Attending: Nurse Practitioner

## 2022-12-09 DIAGNOSIS — M6281 Muscle weakness (generalized): Secondary | ICD-10-CM | POA: Diagnosis present

## 2022-12-09 DIAGNOSIS — M797 Fibromyalgia: Secondary | ICD-10-CM | POA: Insufficient documentation

## 2022-12-09 DIAGNOSIS — M5459 Other low back pain: Secondary | ICD-10-CM | POA: Diagnosis present

## 2022-12-09 DIAGNOSIS — M25562 Pain in left knee: Secondary | ICD-10-CM | POA: Insufficient documentation

## 2022-12-09 DIAGNOSIS — M25552 Pain in left hip: Secondary | ICD-10-CM | POA: Insufficient documentation

## 2022-12-09 DIAGNOSIS — G8929 Other chronic pain: Secondary | ICD-10-CM | POA: Diagnosis present

## 2022-12-09 DIAGNOSIS — Z853 Personal history of malignant neoplasm of breast: Secondary | ICD-10-CM | POA: Insufficient documentation

## 2022-12-09 DIAGNOSIS — M25561 Pain in right knee: Secondary | ICD-10-CM | POA: Insufficient documentation

## 2022-12-09 DIAGNOSIS — G894 Chronic pain syndrome: Secondary | ICD-10-CM | POA: Insufficient documentation

## 2022-12-09 NOTE — Therapy (Addendum)
OUTPATIENT PHYSICAL THERAPY ONCOLOGY TREATMENT  Patient Name: Tonya Odonnell MRN: [id number] DOB:02-15-1966, 57 y.o., female Today's Date: 12/09/2022  END OF SESSION:  PT End of Session - 12/09/22 1102     Visit Number 10    Number of Visits 18    Date for PT Re-Evaluation 01/06/23    Authorization Type [employer]    Authorization Time Period 8/12-10/11/24    Authorization - Visit Number 9    Authorization - Number of Visits 12    PT Start Time 1102    PT Stop Time 1150    PT Time Calculation (min) 48 min    Activity Tolerance Patient tolerated treatment well;Patient limited by pain;Treatment limited secondary to medical complications (Comment)    Behavior During Therapy [facility] for tasks assessed/performed                Past Medical History:  Diagnosis Date   Anemia    "USE TO HAVE IT,NOT NOW,2015,2016   Arthritis    HIPS,KNEES,HANDS   Breast cancer in female (HCC)    Cancer (HCC)    LEFT   Hyperlipidemia    Hypertension    Low iron    Neuromuscular disorder (HCC)    MUSCLE PAIN,FIBROMYALGIA   Osteopenia    PVC (premature ventricular contraction)    Thyroid disease    Past Surgical History:  Procedure Laterality Date   Left Ovarian removal Left    MASTECTOMY Left    "LYMPHNODES REMOVED   Patient Active Problem List   Diagnosis Date Noted   Iron deficiency anemia 09/01/2022   Hemorrhoids 08/14/2022   Lichen sclerosus of female genitalia 12/25/2021   Adrenal adenoma, left 09/17/2021   Pain management contract signed 06/13/2021   Chronic pain syndrome 06/03/2021   Chronic arthralgias of knees and hips 05/06/2021   Osteopenia of lumbar spine 05/05/2021   Obesity 05/05/2021   Anxiety 12/05/2020   Gallstones 12/05/2020   Gastroesophageal reflux disease without esophagitis 12/05/2020   Prediabetes 12/05/2020   Hyperlipidemia 02/08/2018   Hepatitis B core antibody positive 08/07/2017   History of breast cancer 12/13/2014   Chronic low back pain 02/17/2014    Fibromyalgia 10/26/2013   Adverse reaction to influenza vaccine 11/22/2012   Essential (primary) hypertension 05/05/2012   Hashimoto's thyroiditis 05/05/2012      REFERRING PROVIDER: Aura Dials, NP  REFERRING DIAG: Chronic LBP, Fibomyalgia, hip and knee pain  THERAPY DIAG:  Muscle weakness (generalized)  Other low back pain  Pain in left hip  Chronic pain of both knees  Chronic pain disorder  Personal history of breast cancer  Fibromyalgia syndrome  ONSET DATE: 2016 with worsening of symptoms  Rationale for Evaluation and Treatment: Rehabilitation  SUBJECTIVE:  SUBJECTIVE STATEMENT:  MY pain is a 10/10 today. I thought I should go to the emergency room on Saturday but I couldn't just sit there for 8 hrs. My chest hurt and I felt like I could hardly breathe. I will see my physician tomorrow. I am still having pain in my thyroid area but the test results were OK. I don't know what to do. I just hurt all over. I feel like I have inflammation all over and I have fluid building up. The pool feels better while I am in it, but when I get out I feel the tiredness and I can't do anything. I can't even do a little work at home now. Driving to the pool and back is too tired for me but it is better than exercising in the clinic. I am always in pain. I can't get up from the floor unless I crawl to the couch. For the last 10 days I can't do anything and the chest pain and SOB is really bothering me. I had been a little better after the pool the previous visit, but the last 10 days have been horrible with chest pain and SOB.   PERTINENT HISTORY:  Pt is s/p left mastectomy in 2016 with chemotherapy and hormonal therapy which was discontinued in 2022.[name] She was diagnosed with FMS a number of years ago. She  complains of  diffuse body aches and retention of fluid. After she finished chemo in 2016  she was on Nortriptyline. When she stopped it she felt a flush of water in her body and since then she has pain in muscles and skin. When she exercises the next day she feels like she can't move. She has tried several different anti depressant medications. Sometimes ibuprofen helps as long as she only takes it 1x/day. She can't sit in chairs more than 20 minutes because it bothers her back. She has been trying to find out for 7 years what is going on. She has arthritis and muscle pain all together. She has a sharp knife like pain in her LB .She was given steroids and muscle relaxer. The back pain started to resolve and she was urinating 5-6 times at night.and she started feeling so much better. She was getting extremely tired and just couldn't do much.  She is no longer on the meds and the pain is slowly starting to come back. She was diagnosed with Fibromyalgia after a swine flu vaccine. She got the vaccine on the left side and her left side of the body felt very different from the right. It got worse in 2015. After that they found the cancer in her LN's. She has swelling in the left UE, and had treatment in [place] with MLD and she has a compression sleeve but she doesn't wear it. She is not interested in being treated for lymphedema as she was treated in [place].[name]     PAIN:  Are you having pain? Yes NPRS scale: 10/10 Pain location: all over Pain orientation: Bilateral  PAIN TYPE: feel heavy like I am holding a lot of weight,fatigue Pain description: constant  Aggravating factors: its always there but worse with cleaning, cooking, up stairs, pulling weeds,after exercise Relieving factors: better in the am, worse in evening  PRECAUTIONS: Left UE lymphedema, FMS, OA , poor vision   WEIGHT BEARING RESTRICTIONS: No  FALLS:  Has patient fallen in last 6 months? No  LIVING ENVIRONMENT: Lives with: lives  with their son Lives in: House/apartment Has following equipment  at home: Single point cane and Walker - 2 wheeled  OCCUPATION: Does not work  LEISURE: TV, Dealer  HAND DOMINANCE: right   PRIOR LEVEL OF FUNCTION: Independent  PATIENT GOALS: Decrease pain, greater ability to be active, clean home    OBJECTIVE:  COGNITION: Overall cognitive status: Within functional limits for tasks assessed   PALPATION: no tenderness with moderate palpation to UT, upper and LB, UE Fibromyalgia points; mainly hurts now with using her muscle massager. Improved since steroids in July  OBSERVATIONS / OTHER ASSESSMENTS: uses hands for rising from chair, wearing slippers because shoes hurt her skin  SENSATION: Light touch: Deficits     POSTURE:  forward head, rounded shoulders   UPPER EXTREMITY AROM/PROM:  A/PROM RIGHT   eval   Shoulder extension   Shoulder flexion   Shoulder abduction   Shoulder internal rotation   Shoulder external rotation     (Blank rows = not tested)  A/PROM LEFT   eval  Shoulder extension   Shoulder flexion   Shoulder abduction   Shoulder internal rotation   Shoulder external rotation     (Blank rows = not tested)   LUMBAR:  FB   hands to mid lower leg  BB Decreased 70%, pain bilateral LBP Right SB; to knee: pain bilateral LB, then resolves, now I feel dizzy Left SB to knee: no pain     LOWER EXTREMITY AROM/PROM:  A/PROM Right eval RIGHT 12/09/2022  Hip flexion 3 3- to 3  Hip extension 1 bridge very fatigued   Hip abduction  4seated  Hip adduction  4  Hip internal rotation 4 4  Hip external rotation 4 4  Knee flexion 4 4+  Knee extension 4 4-  Ankle dorsiflexion 4+ 4+  Ankle plantarflexion 4   Ankle inversion    Ankle eversion     (Blank rows = not tested)  A/PROM LEFT eval LEFT 12/09/2022  Hip flexion 3- 3-  Hip extension 1 bridge very fatigued   Hip abduction 4 4 seated  Hip adduction 4 4  Hip internal rotation 3+ 3+  Hip  external rotation 3+ 4-  Knee flexion 4 4  Knee extension 4 4+  Ankle dorsiflexion 4+ 4+  Ankle plantarflexion 4   Ankle inversion    Ankle eversion     (Blank rows = not tested)  LYMPHEDEMA ASSESSMENTS: Pt. Said she has a compression sleeve but doesn't wear it. She does not want to be treated for lymphedema.   SURGERY TYPE/DATE: Left Lumpectomy 12/2014 for Gr. 2 IDC, Mastectomy 01/2015 due to close margins  NUMBER OF LYMPH NODES REMOVED: 2/10  CHEMOTHERAPY: YES  RADIATION:NO  HORMONE TREATMENT: YES  INFECTIONS: NO  LYMPHEDEMA ASSESSMENTS: Pt has a compression sleeve but does not wear. She doesn't want to treat her lymphedema  LANDMARK RIGHT  eval  10 cm proximal to olecranon process   Olecranon process   10 cm proximal to ulnar styloid process   Just proximal to ulnar styloid process   Across hand at thumb web space   At base of 2nd digit   (Blank rows = not tested)  LANDMARK LEFT  eval  10 cm proximal to olecranon process   Olecranon process   10 cm proximal to ulnar styloid process   Just proximal to ulnar styloid process   Across hand at thumb web space   At base of 2nd digit   (Blank rows = not tested)   FUNCTIONAL TESTS:  5x sit to stand  22.65 seconds  GAIT: Distance walked: front desk to room 8 Assistive device utilized: None Level of assistance: Complete Independence  LOWER Extremity Functional Scale; 13/80 eval, 12/09/2022 12/80   TODAY'S TREATMENT:                                                                                                                                         DATE:  12/09/2022 Spent time discussing pt concerns for recert; New complaint over last 10 days of bad chest pain and difficulty breathing. No SOB while sitting but when she layed on table c/o chest pain and started breathing hard, then recovered. Tested LE strength and assessed goals. No new goals achieved. Pt is seeing MD tomorrow.   LEFS today slightly worse at  12/80 vs 13/80 at eval   12/03/22: Pt arrives for aquatic physical therapy. Treatment took place in 3.5-5.5 feet of water. Water temperature was 91 degrees F. Pt entered the pool via stairs independently. Pt requires buoyancy of water for support and to offload joints with strengthening exercises.  Seated water bench with 75% submersion Pt performed seated LE AROM exercises 20x in all planes, discussed her symptoms.  75% depth water walking with natural arms 4x in each direction with 2 min seated rest break. Semi seated arm extensions focusing on core 20x. Hip 3 way kicks Bil 10x slow with support. Pt was pleased with this level of exercise today.  11/21/22:Pt arrives for aquatic physical therapy. Treatment took place in 3.5-5.5 feet of water. Water temperature was 91 degrees F. Pt entered the pool via stairs with mild use of rails. Pt requires buoyancy of water for support and to offload joints with strengthening exercises.  Seated water bench with 75% submersion Pt performed seated LE AROM exercises 20x in all planes, with concurrent discussion of current status.  75% depth water walking with small noodle 6x each direction monitoring for excessive fatigue. Took 30 sec rest breaks today in between her direction changes. Standing with single buoy float core/lat press 10x with Pilates breathing. Single knee to chest in same position 2x 3 breaths. High knee marching 20x. Bil hamstring stretching at stairs 2x 3 breaths followed by standing childs pose 3x 3 breaths. Standing single leg knee ext with teal noodle 10x Bil.  Pt did breast stroke 3x2 lenghts at end of session. Standing shoulder horizontal abd/add 2x10 with single buoy UE wts. Seated decompression 5 min post.   11/18/2022 Nu Step seat 6, UE 9, Lev 3 x 5 min  , 284 steps PPT x 10 PPT with ball squeeze x 10, ppt with hip abd x 10, ppt with alternate arm raises 2x5, ppt with march 2 x 5 Bridging x 10 Bilateral piriformis stretch 3 x 20  sec Bilateral HS stretch with strap 3 x 20 sec Marching in bars;no HH Sidestepping in parallel bars, Forward and lateral step and hold on airex  x 5 ea DLS on ax eyes open x 30 sec, Eyes closed 2 x 20 sec;CGA of PT,1 LOB corrected by PT/pt Scapular retraction x 10 yellow  PATIENT EDUCATION:  Access Code: [email or web address] URL: [email or web address] Date: 11/04/2022 Prepared by: Alvira Monday  Exercises - Supine Pelvic Tilt  - 1 x daily - 7 x weekly - 1 sets - 10 reps - Supine Bridge  - 1 x daily - 7 x weekly - 1 sets - 5 reps - Supine Piriformis Stretch with Foot on Ground  - 1 x daily - 7 x weekly - 1 sets - 3 reps - 20 hold - Supine Hamstring Stretch with Strap  - 1 x daily - 7 x weekly - 1 sets - 3 reps - 20 hold - Gastroc Stretch on Wall  - 1 x daily - 7 x weekly - 1 sets - 3 reps - 20 hold Education details: POC, length of stay, Cancellation prn done day before Person educated: Patient Education method: Explanation Education comprehension: verbalized understanding  HOME EXERCISE PROGRAM: NA  ASSESSMENT:  CLINICAL IMPRESSION: Pt arrives to therapy not feeling well and reporting overall pain 10-11/10, with newer complaint of chest pain for about 10 days with SOB . She thought she should go to the ER over the weekend, but didn't. She does have an appt with her PCP tomorrow. She reports she was doing slightly better after her first pool session, but in the last 10 days she has been hurting all over  more so than usual and unable to tolerate much. In addition the new chest pain and SOB over the last 10 days is worrying her. She is to see her MD tomorrow to be cleared to return to therapy. If she is cleared, but not able to tolerate therapy she will be discharged.  OBJECTIVE IMPAIRMENTS: . decreased activity tolerance, decreased endurance, decreased knowledge of condition, difficulty walking, decreased ROM, decreased strength, increased edema, impaired UE functional use, impaired  vision/preception, postural dysfunction, and pain.   ACTIVITY LIMITATIONS: carrying, lifting, bending, sitting, standing, sleeping, stairs, and reach over head   PARTICIPATION LIMITATIONS:  cleaning, community activity, and yard work   PERSONAL FACTORS: Behavior pattern, Time since onset of injury/illness/exacerbation, and 3+ comorbidities: Left breast cancer s/p chemo  are also affecting patient's functional outcome.   REHAB POTENTIAL: Fair    CLINICAL DECISION MAKING: Stable/uncomplicated  EVALUATION COMPLEXITY: Low  GOALS: Goals reviewed with patient? Yes  SHORT TERM GOALS=LONG TERM GOALS: Target date: 6 weeks  Pt will be independent  and a tolerant of a HEP for gentle ROM/stretching Baseline: Goal status: in Progess; does what she can when she has energy and less pain  2.  Pt will have improved strength of Bilateral LE hip flexion, IR and ER to at least 4/5 for improved safety  Baseline:  Goal status: in progress  3.  Pt will be able to shop for 25-50% longer periods of time without leaning over the shopping cart Baseline: Pt was able to shop after last aquatics session and go home and cook - 11/14/22, Not presently since chest pain in last 10 days Goal status: PROGRESSING  4.  Pt will be tolerant of low level strengthening to improve core,and LE endurance and strength Baseline: Pt able to tolerate full PT session and mild progression from last session - 11/14/22 Goal status: MET, but not on a regular basis  5.  LEFS will be increased to 35/80  demonstrate improved function.  Baseline:13/80, 12/09/2022 12/80  Goal status NOT MET 6   Pt will be able to perform 5 bridges with 3 second hold to improve strength for bed mobility  Goal Status:  MET,  7.  Pt will perform 5 sit to stands in 15 seconds to demonstrate improved strength /function  Baseline:26 seconds  Goal Status: In progress (12/09/22)  23 sec with use of hands   PLAN:  PT FREQUENCY: 2x/week  PT DURATION: 4  weeks  PLANNED INTERVENTIONS: Therapeutic exercises, Therapeutic activity, Neuromuscular re-education, Gait training, Patient/Family education, Self Care, Joint mobilization, Orthotic/Fit training, Dry Needling, Manual therapy, and Re-evaluation, aquatic therapy  PLAN FOR NEXT SESSION:  Did pt see PCP. IS she OK to resume therapy? Resume slowly and progress slowly if able to resume. PHYSICAL THERAPY DISCHARGE SUMMARY  Visits from Start of Care: 10  Current functional level related to goals / functional outcomes: Pt has not achieved all goals. Being DC due to change in medical status   Remaining deficits: Continued pain, balance deficits, complaints of chest pain and decreased strength and functional deficits remain   Education / Equipment: Initial HEP, but wasn't performing regularly   Patient agrees to discharge. Patient goals were partially met. Patient is being discharged due to a change in medical status.    Waynette Buttery, PT 12/09/2022, 5:44 PM

## 2022-12-10 ENCOUNTER — Emergency Department: Payer: Medicaid Other

## 2022-12-10 ENCOUNTER — Ambulatory Visit: Payer: Self-pay | Admitting: Physical Therapy

## 2022-12-10 ENCOUNTER — Encounter: Payer: Self-pay | Admitting: Nurse Practitioner

## 2022-12-10 ENCOUNTER — Observation Stay
Admission: EM | Admit: 2022-12-10 | Discharge: 2022-12-13 | Disposition: A | Discharge status: Home / Self Care | Payer: Medicaid Other | Attending: Internal Medicine | Admitting: Internal Medicine

## 2022-12-10 ENCOUNTER — Ambulatory Visit: Payer: Medicaid Other | Admitting: Nurse Practitioner

## 2022-12-10 ENCOUNTER — Encounter: Payer: Self-pay | Admitting: Emergency Medicine

## 2022-12-10 ENCOUNTER — Other Ambulatory Visit: Payer: Self-pay

## 2022-12-10 VITALS — BP 144/92 | HR 87 | Temp 97.6°F | Resp 18 | Ht 63.0 in | Wt 186.2 lb

## 2022-12-10 DIAGNOSIS — R0600 Dyspnea, unspecified: Secondary | ICD-10-CM

## 2022-12-10 DIAGNOSIS — E876 Hypokalemia: Secondary | ICD-10-CM | POA: Diagnosis not present

## 2022-12-10 DIAGNOSIS — R0602 Shortness of breath: Secondary | ICD-10-CM

## 2022-12-10 DIAGNOSIS — R0989 Other specified symptoms and signs involving the circulatory and respiratory systems: Secondary | ICD-10-CM | POA: Diagnosis not present

## 2022-12-10 DIAGNOSIS — I50810 Right heart failure, unspecified: Secondary | ICD-10-CM

## 2022-12-10 DIAGNOSIS — I160 Hypertensive urgency: Secondary | ICD-10-CM | POA: Insufficient documentation

## 2022-12-10 DIAGNOSIS — Z853 Personal history of malignant neoplasm of breast: Secondary | ICD-10-CM | POA: Insufficient documentation

## 2022-12-10 DIAGNOSIS — R9431 Abnormal electrocardiogram [ECG] [EKG]: Secondary | ICD-10-CM | POA: Diagnosis not present

## 2022-12-10 DIAGNOSIS — I200 Unstable angina: Secondary | ICD-10-CM | POA: Diagnosis not present

## 2022-12-10 DIAGNOSIS — R7303 Prediabetes: Secondary | ICD-10-CM | POA: Diagnosis not present

## 2022-12-10 DIAGNOSIS — Z79899 Other long term (current) drug therapy: Secondary | ICD-10-CM | POA: Insufficient documentation

## 2022-12-10 DIAGNOSIS — I10 Essential (primary) hypertension: Secondary | ICD-10-CM

## 2022-12-10 DIAGNOSIS — I110 Hypertensive heart disease with heart failure: Secondary | ICD-10-CM | POA: Insufficient documentation

## 2022-12-10 DIAGNOSIS — I272 Pulmonary hypertension, unspecified: Secondary | ICD-10-CM

## 2022-12-10 DIAGNOSIS — K219 Gastro-esophageal reflux disease without esophagitis: Secondary | ICD-10-CM | POA: Insufficient documentation

## 2022-12-10 DIAGNOSIS — R079 Chest pain, unspecified: Principal | ICD-10-CM | POA: Insufficient documentation

## 2022-12-10 DIAGNOSIS — E039 Hypothyroidism, unspecified: Secondary | ICD-10-CM | POA: Diagnosis not present

## 2022-12-10 DIAGNOSIS — J9811 Atelectasis: Secondary | ICD-10-CM | POA: Diagnosis not present

## 2022-12-10 DIAGNOSIS — E063 Autoimmune thyroiditis: Secondary | ICD-10-CM | POA: Diagnosis not present

## 2022-12-10 LAB — HEPATIC FUNCTION PANEL
ALT: 25 U/L (ref 0–44)
AST: 20 U/L (ref 15–41)
Albumin: 4 g/dL (ref 3.5–5.0)
Alkaline Phosphatase: 58 U/L (ref 38–126)
Bilirubin, Direct: 0.3 mg/dL — ABNORMAL HIGH (ref 0.0–0.2)
Indirect Bilirubin: 1.8 mg/dL — ABNORMAL HIGH (ref 0.3–0.9)
Total Bilirubin: 2.1 mg/dL — ABNORMAL HIGH (ref 0.3–1.2)
Total Protein: 6.8 g/dL (ref 6.5–8.1)

## 2022-12-10 LAB — CBC
HCT: 53.7 % — ABNORMAL HIGH (ref 36.0–46.0)
Hemoglobin: 18 g/dL — ABNORMAL HIGH (ref 12.0–15.0)
MCH: 28.8 pg (ref 26.0–34.0)
MCHC: 33.5 g/dL (ref 30.0–36.0)
MCV: 86.1 fL (ref 80.0–100.0)
Platelets: 340 10*3/uL (ref 150–400)
RBC: 6.24 MIL/uL — ABNORMAL HIGH (ref 3.87–5.11)
RDW: 13.2 % (ref 11.5–15.5)
WBC: 10 10*3/uL (ref 4.0–10.5)
nRBC: 0 % (ref 0.0–0.2)

## 2022-12-10 LAB — TROPONIN I (HIGH SENSITIVITY)
Troponin I (High Sensitivity): 9 ng/L (ref <18)
Troponin I (High Sensitivity): 11 ng/L (ref <18)
Troponin I (High Sensitivity): 14 ng/L (ref <18)

## 2022-12-10 LAB — BASIC METABOLIC PANEL
Anion gap: 13 (ref 5–15)
BUN: 20 mg/dL (ref 6–20)
CO2: 22 mmol/L (ref 22–32)
Calcium: 9.7 mg/dL (ref 8.9–10.3)
Chloride: 105 mmol/L (ref 98–111)
Creatinine, Ser: 0.75 mg/dL (ref 0.44–1.00)
GFR, Estimated: >60 mL/min (ref >60)
Glucose, Bld: 131 mg/dL — ABNORMAL HIGH (ref 70–99)
Potassium: 3.3 mmol/L — ABNORMAL LOW (ref 3.5–5.1)
Sodium: 140 mmol/L (ref 135–145)

## 2022-12-10 LAB — BRAIN NATRIURETIC PEPTIDE: B Natriuretic Peptide: 275.2 pg/mL — ABNORMAL HIGH (ref 0.0–100.0)

## 2022-12-10 LAB — LACTIC ACID, PLASMA: Lactic Acid, Venous: 0.9 mmol/L (ref 0.5–1.9)

## 2022-12-10 MED ORDER — METOPROLOL SUCCINATE ER 50 MG PO TB24
50.0000 mg | ORAL_TABLET | Freq: Two times a day (BID) | ORAL | Status: DC
  Administered 2022-12-11 – 2022-12-13 (×5): 50 mg via ORAL
  Filled 2022-12-10 (×6): qty 1

## 2022-12-10 MED ORDER — VITAMIN D3 25 MCG (1000 UNIT) PO TABS
1000.0000 [IU] | ORAL_TABLET | Freq: Every day | ORAL | Status: DC
  Administered 2022-12-11 – 2022-12-13 (×3): 1000 [IU] via ORAL
  Filled 2022-12-10 (×6): qty 1

## 2022-12-10 MED ORDER — PANTOPRAZOLE SODIUM 40 MG PO TBEC
40.0000 mg | DELAYED_RELEASE_TABLET | Freq: Every day | ORAL | Status: DC
  Administered 2022-12-11 – 2022-12-13 (×3): 40 mg via ORAL
  Filled 2022-12-10 (×3): qty 1

## 2022-12-10 MED ORDER — FUROSEMIDE 10 MG/ML IJ SOLN
20.0000 mg | Freq: Once | INTRAMUSCULAR | Status: AC
  Administered 2022-12-10: 20 mg via INTRAVENOUS
  Filled 2022-12-10: qty 4

## 2022-12-10 MED ORDER — IOHEXOL 350 MG/ML SOLN
75.0000 mL | Freq: Once | INTRAVENOUS | Status: AC | PRN
  Administered 2022-12-10: 75 mL via INTRAVENOUS

## 2022-12-10 MED ORDER — CROMOLYN SODIUM 4 % OP SOLN: 1.0000 [drp] | Freq: Four times a day (QID) | OPHTHALMIC | 4 refills | Status: AC

## 2022-12-10 MED ORDER — NITROGLYCERIN 2 % TD OINT
1.0000 [in_us] | TOPICAL_OINTMENT | Freq: Once | TRANSDERMAL | Status: AC
  Administered 2022-12-10: 1 [in_us] via TOPICAL
  Filled 2022-12-10: qty 1

## 2022-12-10 MED ORDER — HYDROCHLOROTHIAZIDE 12.5 MG PO TABS
12.5000 mg | ORAL_TABLET | Freq: Every day | ORAL | Status: DC
  Administered 2022-12-11 – 2022-12-13 (×3): 12.5 mg via ORAL
  Filled 2022-12-10 (×3): qty 1

## 2022-12-10 MED ORDER — ONDANSETRON HCL 4 MG/2ML IJ SOLN: 4.0000 mg | Freq: Four times a day (QID) | INTRAMUSCULAR | Status: DC | PRN

## 2022-12-10 MED ORDER — HYDRALAZINE HCL 25 MG PO TABS
25.0000 mg | ORAL_TABLET | Freq: Two times a day (BID) | ORAL | Status: DC
  Administered 2022-12-11 – 2022-12-13 (×5): 25 mg via ORAL
  Filled 2022-12-10 (×5): qty 1

## 2022-12-10 MED ORDER — ALPRAZOLAM 0.25 MG PO TABS: 0.2500 mg | ORAL_TABLET | Freq: Two times a day (BID) | ORAL | Status: DC | PRN

## 2022-12-10 MED ORDER — VITAMIN B-12 1000 MCG PO TABS
1000.0000 ug | ORAL_TABLET | Freq: Every day | ORAL | Status: DC
  Administered 2022-12-11 – 2022-12-13 (×3): 1000 ug via ORAL
  Filled 2022-12-10 (×2): qty 1
  Filled 2022-12-10: qty 2

## 2022-12-10 MED ORDER — LEVOTHYROXINE SODIUM 112 MCG PO TABS
112.0000 ug | ORAL_TABLET | Freq: Every day | ORAL | Status: DC
  Administered 2022-12-11 – 2022-12-13 (×3): 112 ug via ORAL
  Filled 2022-12-10 (×3): qty 1

## 2022-12-10 MED ORDER — ACETAMINOPHEN 325 MG PO TABS
650.0000 mg | ORAL_TABLET | ORAL | Status: DC | PRN
  Administered 2022-12-11: 650 mg via ORAL
  Filled 2022-12-10 (×2): qty 2

## 2022-12-10 MED ORDER — HYDRALAZINE HCL 25 MG PO TABS: 25.0000 mg | ORAL_TABLET | Freq: Two times a day (BID) | ORAL | 1 refills | Status: DC

## 2022-12-10 MED ORDER — ASPIRIN 81 MG PO CHEW
324.0000 mg | CHEWABLE_TABLET | Freq: Once | ORAL | Status: AC
  Administered 2022-12-10: 324 mg via ORAL
  Filled 2022-12-10: qty 4

## 2022-12-10 MED ORDER — FLUTICASONE PROPIONATE 50 MCG/ACT NA SUSP: 2.0000 | NASAL | Status: DC | PRN

## 2022-12-10 MED ORDER — MAGNESIUM HYDROXIDE 400 MG/5ML PO SUSP: 30.0000 mL | Freq: Every day | ORAL | Status: DC | PRN

## 2022-12-10 MED ORDER — POTASSIUM CHLORIDE 20 MEQ PO PACK
40.0000 meq | PACK | Freq: Once | ORAL | Status: AC
  Administered 2022-12-10: 40 meq via ORAL
  Filled 2022-12-10: qty 2

## 2022-12-10 MED ORDER — VITAMIN C 500 MG PO TABS
500.0000 mg | ORAL_TABLET | Freq: Every day | ORAL | Status: DC
  Administered 2022-12-11 – 2022-12-13 (×3): 500 mg via ORAL
  Filled 2022-12-10 (×3): qty 1

## 2022-12-10 MED ORDER — POTASSIUM CHLORIDE CRYS ER 20 MEQ PO TBCR
40.0000 meq | EXTENDED_RELEASE_TABLET | Freq: Once | ORAL | Status: AC
  Administered 2022-12-10: 40 meq via ORAL
  Filled 2022-12-10: qty 2

## 2022-12-10 MED ORDER — POTASSIUM CHLORIDE IN NACL 20-0.9 MEQ/L-% IV SOLN
INTRAVENOUS | Status: DC
  Filled 2022-12-10: qty 1000

## 2022-12-10 MED ORDER — ENOXAPARIN SODIUM 40 MG/0.4ML IJ SOSY
40.0000 mg | PREFILLED_SYRINGE | INTRAMUSCULAR | Status: DC
  Administered 2022-12-11 – 2022-12-13 (×3): 40 mg via SUBCUTANEOUS
  Filled 2022-12-10 (×3): qty 0.4

## 2022-12-10 MED ORDER — MINOXIDIL 2.5 MG PO TABS
2.5000 mg | ORAL_TABLET | Freq: Every day | ORAL | Status: DC
  Administered 2022-12-11: 1.25 mg via ORAL
  Filled 2022-12-10: qty 1

## 2022-12-10 NOTE — H&P (Incomplete)
PATIENT NAME: Tonya Odonnell    MR#:  [id number]  DATE OF BIRTH:  11-20-1965  DATE OF ADMISSION:  12/10/2022  PRIMARY CARE PHYSICIAN: Marjie Skiff, NP   Patient is coming from: Home  REQUESTING/REFERRING PHYSICIAN: Shaune Pollack, MD  CHIEF COMPLAINT:   Chief Complaint  Patient presents with  . Chest Pain  . Shortness of Breath    HISTORY OF PRESENT ILLNESS:  Tonya Odonnell is a 57 y.o. Caucasian female with medical history significant for breast cancer s/p mastectomy, hypertension, dyslipidemia and hypothyroidism, who presented to emergency room with acute onset of dyspnea on exertion as well as intermittent chest pain felt as pressure over the last couple of weeks worsening with exertion as well as generalized weakness.  She was recently given diuretic therapy for lower extremity edema.  He describes the chest pain location: Dull aching substernal chest pressure.  No cough or wheezing or hemoptysis.  No fever or chills.  No history of coronary artery disease.  No nausea or vomiting or abdominal pain.  He denied any associated palpitations.  No melena or bright red blood per rectum.  No other bleeding diathesis.  ED Course: When he came to the ER, BP was 164/113 with otherwise normal vital signs later on heart rate was 110 and respiratory rate 24.  Labs revealed mild kalemia of 3.3 and total bili of 2.1 with otherwise unremarkable CMP.  BNP was 275.2 and high sensitive troponin I was 9 and later 11.  CBC showed hemoconcentration and lactic acid was 0.9. EKG as reviewed by me : EKG showed sinus tachycardia with a rate of 107 with low voltage QRS and poor R wave progression with T wave inversion anterolaterally and inferiorly. Imaging: Two-view chest x-ray showed low lung volumes with bibasilar atelectasis.  The patient was given an inch of Nitropaste, 40 meq p.o. potassium chloride, 4 baby aspirin and 20 mg of IV Lasix.  He will be admitted to an observation  medical telemetry bed for further evaluation and management. PAST MEDICAL HISTORY:   Past Medical History:  Diagnosis Date  . Anemia    "USE TO HAVE IT,NOT QMV,[id number]  . Arthritis    HIPS,KNEES,HANDS  . Breast cancer in female [facility])   . Cancer (HCC)    LEFT  . Hyperlipidemia   . Hypertension   . Low iron   . Neuromuscular disorder (HCC)    MUSCLE PAIN,FIBROMYALGIA  . Osteopenia   . PVC (premature ventricular contraction)   . Thyroid disease     PAST SURGICAL HISTORY:   Past Surgical History:  Procedure Laterality Date  . Left Ovarian removal Left   . MASTECTOMY Left    "LYMPHNODES REMOVED    SOCIAL HISTORY:   Social History   Tobacco Use  . Smoking status: Never  . Smokeless tobacco: Never  Substance Use Topics  . Alcohol use: Never    FAMILY HISTORY:   Family History  Problem Relation Age of Onset  . Hypertension Mother   . Leukemia Mother   . Stomach cancer Father   . Colon cancer Neg Hx   . Colon polyps Neg Hx   . Crohn's disease Neg Hx   . Esophageal cancer Neg Hx   . Rectal cancer Neg Hx     DRUG ALLERGIES:   Allergies  Allergen Reactions  . Lisinopril Other (See Comments)    cough cough     REVIEW OF SYSTEMS:   ROS As per  history of present illness. All pertinent systems were reviewed above. Constitutional, HEENT, cardiovascular, respiratory, GI, GU, musculoskeletal, neuro, psychiatric, endocrine, integumentary and hematologic systems were reviewed and are otherwise negative/unremarkable except for positive findings mentioned above in the HPI.   MEDICATIONS AT HOME:   Prior to Admission medications   Medication Sig Start Date End Date Taking? Authorizing Provider  ascorbic acid (VITAMIN C) 500 MG tablet Take 500 mg by mouth daily.    [provider]  Azelaic Acid 15 % gel Apply to face every morning 08/26/22   Willeen Niece, MD  cetirizine-pseudoephedrine (ZYRTEC-D) 5-120 MG tablet Take 1 tablet by mouth 2 (two) times  daily. 07/08/22   Aura Dials T, NP  Cholecalciferol 25 MCG (1000 UT) tablet Take 1 tablet (1,000 Units total) by mouth daily. 11/22/21   Cannady, Corrie Dandy T, NP  clobetasol cream (TEMOVATE) 0.05 % Apply 1 application topically 2 (two) times daily. Apply to affected area 01/09/21   Federico Flake, MD  cromolyn (OPTICROM) 4 % ophthalmic solution Place 1 drop into both eyes 4 (four) times daily. 12/10/22   Cannady, Corrie Dandy T, NP  cyanocobalamin (VITAMIN B12) 1000 MCG tablet Take 1,000 mcg by mouth daily.    [provider]  diclofenac Sodium (VOLTAREN) 1 % GEL Apply topically as needed.    [provider]  diltiazem 2 % GEL Apply 1 Application topically 3 (three) times daily. Inside the rectum as directed. 08/21/22   Meredith Pel, NP  ELIDEL 1 % cream Apply to red scaly areas on the face BID for seb derm 04/09/22   Willeen Niece, MD  fluticasone [facility]) 50 MCG/ACT nasal spray Place 2 sprays into both nostrils daily. Patient taking differently: Place 2 sprays into both nostrils as needed. 12/13/20 12/13/21  Vigg, Roma Schanz, MD  hydrALAZINE (APRESOLINE) 25 MG tablet Take 1 tablet (25 mg total) by mouth 2 (two) times daily. 12/10/22   Cannady, Corrie Dandy T, NP  hydrochlorothiazide (HYDRODIURIL) 12.5 MG tablet Take 12.5 mg by mouth daily.    [provider]  ibuprofen (ADVIL) 200 MG tablet Take 1 tablet (200 mg total) by mouth every 6 (six) hours as needed. 09/01/22   Cannady, Corrie Dandy T, NP  Ivermectin (SOOLANTRA) 1 % CREA Apply 1 Application topically at bedtime. 09/04/21   Willeen Niece, MD  levothyroxine (SYNTHROID) 112 MCG tablet Take 1 tablet (112 mcg total) by mouth daily. 10/31/22   Cannady, Corrie Dandy T, NP  metoprolol succinate (TOPROL-XL) 50 MG 24 hr tablet Take 1 tablet (50 mg total) by mouth in the morning and at bedtime. 11/25/21   Cannady, Corrie Dandy T, NP  minoxidil (LONITEN) 2.5 MG tablet Take 1 tablet (2.5 mg total) by mouth daily. 02/25/22   Willeen Niece, MD  omeprazole  (PRILOSEC) 40 MG capsule Take 1 capsule (40 mg total) by mouth daily. 03/21/22 03/22/23  Cannady, Corrie Dandy T, NP  valACYclovir (VALTREX) 1000 MG tablet Take 1,000 mg by mouth daily. 08/12/22   [provider]      VITAL SIGNS:  Blood pressure (!) 176/112, pulse 87, temperature 98.2 F (36.8 C), temperature source Oral, resp. rate 19, weight 84.5 kg, SpO2 97%.  PHYSICAL EXAMINATION:  Physical Exam  GENERAL:  57 y.o.-year-old patient lying in the bed with no acute distress.  EYES: Pupils equal, round, reactive to light and accommodation. No scleral icterus. Extraocular muscles intact.  HEENT: Head atraumatic, normocephalic. Oropharynx and nasopharynx clear.  NECK:  Supple, no jugular venous distention. No thyroid enlargement, no tenderness.  LUNGS:  Normal breath sounds bilaterally, no wheezing, rales,rhonchi or crepitation. No use of accessory muscles of respiration.  CARDIOVASCULAR: Regular rate and rhythm, S1, S2 normal. No murmurs, rubs, or gallops.  ABDOMEN: Soft, nondistended, nontender. Bowel sounds present. No organomegaly or mass.  EXTREMITIES: No pedal edema, cyanosis, or clubbing.  NEUROLOGIC: Cranial nerves II through XII are intact. Muscle strength 5/5 in all extremities. Sensation intact. Gait not checked.  PSYCHIATRIC: The patient is alert and oriented x 3.  Normal affect and good eye contact. SKIN: No obvious rash, lesion, or ulcer.   LABORATORY PANEL:   CBC Recent Labs  Lab 12/10/22 1838  WBC 10.0  HGB 18.0*  HCT 53.7*  PLT 340   ------------------------------------------------------------------------------------------------------------------  Chemistries  Recent Labs  Lab 12/10/22 1838 12/10/22 2021  NA 140  --   K 3.3*  --   CL 105  --   CO2 22  --   GLUCOSE 131*  --   BUN 20  --   CREATININE 0.75  --   CALCIUM 9.7  --   AST  --  20  ALT  --  25  ALKPHOS  --  58  BILITOT  --  2.1*    ------------------------------------------------------------------------------------------------------------------  Cardiac Enzymes No results for input(s): "TROPONINI" in the last 168 hours. ------------------------------------------------------------------------------------------------------------------  RADIOLOGY:  CT Angio Chest PE W and/or Wo Contrast  Result Date: 12/10/2022 CLINICAL DATA:  Chest pain for 2 weeks, initial encounter EXAM: CT ANGIOGRAPHY CHEST WITH CONTRAST TECHNIQUE: Multidetector CT imaging of the chest was performed using the standard protocol during bolus administration of intravenous contrast. Multiplanar CT image reconstructions and MIPs were obtained to evaluate the vascular anatomy. RADIATION DOSE REDUCTION: This exam was performed according to the departmental dose-optimization program which includes automated exposure control, adjustment of the mA and/or kV according to patient size and/or use of iterative reconstruction technique. CONTRAST:  75mL OMNIPAQUE IOHEXOL 350 MG/ML SOLN COMPARISON:  Chest x-ray from earlier in the same day. FINDINGS: Cardiovascular: Pulmonary artery shows a normal branching pattern. No filling defect to suggest pulmonary embolism is identified. The ascending aorta is incompletely evaluated due to the timing of the contrast bolus. No aneurysmal dilatation is seen. Some adjacent fluid attenuation is noted along the ascending aorta. This is felt to represent the superior pericardial reflection. No hyperdense crescent to suggest acute aortic injury is noted. Heart is not significantly enlarged in size. Mediastinum/Nodes: Thoracic inlet is within normal limits. No hilar or mediastinal adenopathy is noted. The esophagus as visualized is within normal limits. Lungs/Pleura: Lungs are well aerated bilaterally. No focal infiltrate or effusion is seen. No parenchymal nodules are noted. Upper Abdomen: Visualized upper abdomen shows no acute abnormality.  Musculoskeletal: Left breast implant is seen. No acute bony abnormality is noted. Review of the MIP images confirms the above findings. IMPRESSION: No evidence of pulmonary emboli. No acute abnormality seen. Electronically Signed   By: Alcide Clever M.D.   On: 12/10/2022 21:52   DG Chest 2 View  Result Date: 12/10/2022 CLINICAL DATA:  Chest pain and shortness of breath EXAM: CHEST - 2 VIEW COMPARISON:  None Available. FINDINGS: Low lung volumes accentuate mediastinal silhouette and pulmonary vascularity. bibasilar atelectasis. No pleural effusion or pneumothorax. No displaced rib fractures. IMPRESSION: Low lung volumes with bibasilar atelectasis. Electronically Signed   By: Minerva Fester M.D.   On: 12/10/2022 19:41      IMPRESSION AND PLAN:  Assessment and Plan: No notes have been filed under this hospital service. Service: Hospitalist  DVT prophylaxis: Lovenox***  Advanced Care Planning:  Code Status: full code***  Family Communication:  The plan of care was discussed in details with the patient (and family). I answered all questions. The patient agreed to proceed with the above mentioned plan. Further management will depend upon hospital course. Disposition Plan: Back to previous home environment Consults called: none***  All the records are reviewed and case discussed with ED provider.  Status is: Observation {Observation:23811}   At the time of the admission, it appears that the appropriate admission status for this patient is inpatient.  This is judged to be reasonable and necessary in order to provide the required intensity of service to ensure the patient's safety given the presenting symptoms, physical exam findings and initial radiographic and laboratory data in the context of comorbid conditions.  The patient requires inpatient status due to high intensity of service, high risk of further deterioration and high frequency of surveillance required.  I certify that at the  time of admission, it is my clinical judgment that the patient will require inpatient hospital care extending more than 2 midnights.                            Dispo: The patient is from: Home              Anticipated d/c is to: Home              Patient currently is not medically stable to d/c.              Difficult to place patient: No  Hannah Beat M.D on 12/10/2022 at 10:50 PM  [facility]   From 7 PM-7 AM, contact night-coverage [email or web address]  CC: Primary care physician; Marjie Skiff, NP

## 2022-12-10 NOTE — Assessment & Plan Note (Signed)
New onset over past 2 weeks with chest pain presenting, one episode recently she reports struggling a lot with breathing.  EKG in office today abnormal compared to past EKG write ups. Recommend she start Baby ASA daily.  Urgent referral to cardiology placed for further assessment.  Labs: BNP, CBC, A1c.  Other labs up to date recently and reassuring.  Order for echo placed + CXR.

## 2022-12-10 NOTE — Assessment & Plan Note (Signed)
-   This is associated with abnormal EKG without previous ones for comparison. - The patient will be admitted to an observation cardiac telemetry bed. - Will follow serial troponins and EKGs. - The patient will be placed on aspirin as well as p.r.n. sublingual nitroglycerin and morphine sulfate for pain. - We will obtain a cardiology consult in a.m. for further cardiac risk stratification. - I notified [facility] about the patient

## 2022-12-10 NOTE — ED Provider Notes (Signed)
[facility] Provider Note    Event Date/Time   First MD Initiated Contact with Patient 12/10/22 1907     (approximate)   History   Chest Pain and Shortness of Breath   HPI  Tonya Odonnell is a 57 y.o. female here with chest pain and shortness of breath.  The patient states that over the last several weeks, she has had progressively worsening shortness of breath, chest pressure with exertion, and weakness.  She has been seen by outpatient providers and was started on lipid pills but states that she was not formally diagnosed with CHF.  She states that over the last several days, this is progressively worsened and she now feels extreme shortness of breath as well as a dull, aching, substernal chest pressure with exertion.  Denies known history of coronary disease.  Denies fevers or chills.  She does have a history of breast cancer, is not currently getting treatment for this.     Physical Exam   Triage Vital Signs: ED Triage Vitals  Encounter Vitals Group     BP 12/10/22 1835 (!) 163/111     Systolic BP Percentile --      Diastolic BP Percentile --      Pulse Rate 12/10/22 1835 (!) 103     Resp 12/10/22 1835 18     Temp 12/10/22 1835 98.2 F (36.8 C)     Temp Source 12/10/22 1835 Oral     SpO2 12/10/22 1835 97 %     Weight 12/10/22 1832 186 lb 3.2 oz (84.5 kg)     Height --      Head Circumference --      Peak Flow --      Pain Score 12/10/22 1832 5     Pain Loc --      Pain Education --      Exclude from Growth Chart --     Most recent vital signs: Vitals:   12/10/22 2300 12/10/22 2318  BP: (!) 130/96   Pulse: 84   Resp: (!) 24   Temp:  97.8 F (36.6 C)  SpO2: 94%      General: Awake, no distress.  CV:  Good peripheral perfusion. Tachycardic, regular Resp:  Normal work of breathing. Tachypnea, bilateral rales noted.  Abd:  No distention. No tenderness. Other:  1+ pitting edema bl LE   ED Results / Procedures / Treatments    Labs (all labs ordered are listed, but only abnormal results are displayed) Labs Reviewed  BASIC METABOLIC PANEL - Abnormal; Notable for the following components:      Result Value   Potassium 3.3 (*)    Glucose, Bld 131 (*)    All other components within normal limits  CBC - Abnormal; Notable for the following components:   RBC 6.24 (*)    Hemoglobin 18.0 (*)    HCT 53.7 (*)    All other components within normal limits  BRAIN NATRIURETIC PEPTIDE - Abnormal; Notable for the following components:   B Natriuretic Peptide 275.2 (*)    All other components within normal limits  HEPATIC FUNCTION PANEL - Abnormal; Notable for the following components:   Total Bilirubin 2.1 (*)    Bilirubin, Direct 0.3 (*)    Indirect Bilirubin 1.8 (*)    All other components within normal limits  LACTIC ACID, PLASMA  HIV ANTIBODY (ROUTINE TESTING W REFLEX)  LIPID PANEL  TROPONIN I (HIGH SENSITIVITY)  TROPONIN I (HIGH SENSITIVITY)  TROPONIN I (  HIGH SENSITIVITY)  TROPONIN I (HIGH SENSITIVITY)     EKG Sinus tachycardia, ventricular rate 107.  PR 170, cures 96, QTc 499.  ST depressions noted in the inferior and lateral precordial leads.  No overt ST elevations.   RADIOLOGY Chest x-ray: Bibasilar atelectasis CT angio:No PE   I also independently reviewed and agree with radiologist interpretations.   PROCEDURES:  Critical Care performed: No   MEDICATIONS ORDERED IN ED: Medications  hydrALAZINE (APRESOLINE) tablet 25 mg (25 mg Oral Not Given 12/10/22 2335)  hydrochlorothiazide (HYDRODIURIL) tablet 12.5 mg (has no administration in time range)  metoprolol succinate (TOPROL-XL) 24 hr tablet 50 mg (has no administration in time range)  minoxidil (LONITEN) tablet 2.5 mg (has no administration in time range)  levothyroxine (SYNTHROID) tablet 112 mcg (has no administration in time range)  pantoprazole (PROTONIX) EC tablet 40 mg (has no administration in time range)  cyanocobalamin (VITAMIN  B12) tablet 1,000 mcg (has no administration in time range)  ascorbic acid (VITAMIN C) tablet 500 mg (has no administration in time range)  Cholecalciferol 1,000 Units (has no administration in time range)  fluticasone (FLONASE) 50 MCG/ACT nasal spray 2 spray (has no administration in time range)  acetaminophen (TYLENOL) tablet 650 mg (has no administration in time range)  ondansetron (ZOFRAN) injection 4 mg (has no administration in time range)  enoxaparin (LOVENOX) injection 40 mg (has no administration in time range)  ALPRAZolam (XANAX) tablet 0.25 mg (has no administration in time range)  0.9 % NaCl with KCl 20 mEq/ L  infusion ( Intravenous New Bag/Given 12/10/22 2333)  magnesium hydroxide (MILK OF MAGNESIA) suspension 30 mL (has no administration in time range)  iohexol (OMNIPAQUE) 350 MG/ML injection 75 mL (75 mLs Intravenous Contrast Given 12/10/22 2104)  aspirin chewable tablet 324 mg (324 mg Oral Given 12/10/22 2221)  furosemide (LASIX) injection 20 mg (20 mg Intravenous Given 12/10/22 2223)  potassium chloride SA (KLOR-CON M) CR tablet 40 mEq (40 mEq Oral Given 12/10/22 2220)  nitroGLYCERIN (NITROGLYN) 2 % ointment 1 inch (1 inch Topical Given 12/10/22 2222)  potassium chloride (KLOR-CON) packet 40 mEq (40 mEq Oral Given 12/10/22 2328)     IMPRESSION / MDM / ASSESSMENT AND PLAN / ED COURSE  I reviewed the triage vital signs and the nursing notes.                              Differential diagnosis includes, but is not limited to, ACS, PE, CHF, HTN urgency, anxiety  Patient's presentation is most consistent with acute presentation with potential threat to life or bodily function.  The patient is on the cardiac monitor to evaluate for evidence of arrhythmia and/or significant heart rate changes  57 yo F with PMHx HTN, HLD, anxiety, here with SOB. Initial EKG concerning for NSTEMI with diffuse ST depressions, though CP resolved here in ED. Initial trop negative. CXR clear. CT Angio  obtained given degree of pain, SOB, tachycardia, recent travel and fortunately shows no signs of PE. CBC shows elevated Hgb - may need w/u as inpatient. BMP unremarkable. BNP elevated. History, exam is c/w likely CHF, versus unstable angina. Will admit to medicine.    FINAL CLINICAL IMPRESSION(S) / ED DIAGNOSES   Final diagnoses:  Abnormal EKG  Shortness of breath     Rx / DC Orders   ED Discharge Orders     None        Note:  This document was  prepared using Conservation officer, historic buildings and may include unintentional dictation errors.   Shaune Pollack, MD 12/11/22 [phone]

## 2022-12-10 NOTE — ED Notes (Signed)
Pt ambulated to the restroom with a steady gait.

## 2022-12-10 NOTE — H&P (Signed)
[employer]   PATIENT NAME: Tonya Odonnell    MR#:  [id number]  DATE OF BIRTH:  1965-11-12  DATE OF ADMISSION:  12/10/2022  PRIMARY CARE PHYSICIAN: Tonya Skiff, NP   Patient is coming from: Home  REQUESTING/REFERRING PHYSICIAN: Shaune Pollack, MD  CHIEF COMPLAINT:   Chief Complaint  Patient presents with   Chest Pain   Shortness of Breath    HISTORY OF PRESENT ILLNESS:  Tonya Odonnell is a 57 y.o. Caucasian female with medical history significant for breast cancer s/p mastectomy, hypertension, dyslipidemia and hypothyroidism, who presented to emergency room with acute onset of dyspnea on exertion as well as intermittent chest pain felt as pressure over the last couple of weeks worsening with exertion as well as generalized weakness.  She was recently given diuretic therapy for lower extremity edema.  He describes the chest pain location: Dull aching substernal chest pressure.  She admits to dry cough without wheezing or hemoptysis.  No fever or chills.  No orthopnea or paroxysmal nocturnal dyspnea or lower extremity edema.  No history of coronary artery disease.  No nausea or vomiting but she admitted to diaphoresis.  No abdominal pain.  He denied any associated palpitations.  No melena or bright red blood per rectum.  No other bleeding diathesis.  ED Course: When he came to the ER, BP was 164/113 with otherwise normal vital signs later on heart rate was 110 and respiratory rate 24.  Labs revealed mild kalemia of 3.3 and total bili of 2.1 with otherwise unremarkable CMP.  BNP was 275.2 and high sensitive troponin I was 9 and later 11.  CBC showed hemoconcentration and lactic acid was 0.9. EKG as reviewed by me : EKG showed sinus tachycardia with a rate of 107 with low voltage QRS and poor R wave progression with T wave inversion anterolaterally and inferiorly. Imaging: Two-view chest x-ray showed low lung volumes with bibasilar atelectasis.  The patient was given an inch of  Nitropaste, 40 meq p.o. potassium chloride, 4 baby aspirin and 20 mg of IV Lasix.  He will be admitted to an observation medical telemetry bed for further evaluation and management. PAST MEDICAL HISTORY:   Past Medical History:  Diagnosis Date   Anemia    "USE TO HAVE IT,NOT NOW,2015,2016   Arthritis    HIPS,KNEES,HANDS   Breast cancer in female (HCC)    Cancer (HCC)    LEFT   Hyperlipidemia    Hypertension    Low iron    Neuromuscular disorder (HCC)    MUSCLE PAIN,FIBROMYALGIA   Osteopenia    PVC (premature ventricular contraction)    Thyroid disease     PAST SURGICAL HISTORY:   Past Surgical History:  Procedure Laterality Date   Left Ovarian removal Left    MASTECTOMY Left    "LYMPHNODES REMOVED    SOCIAL HISTORY:   Social History   Tobacco Use   Smoking status: Never   Smokeless tobacco: Never  Substance Use Topics   Alcohol use: Never    FAMILY HISTORY:   Family History  Problem Relation Age of Onset   Hypertension Mother    Leukemia Mother    Stomach cancer Father    Colon cancer Neg Hx    Colon polyps Neg Hx    Crohn's disease Neg Hx    Esophageal cancer Neg Hx    Rectal cancer Neg Hx     DRUG ALLERGIES:   Allergies  Allergen Reactions   Lisinopril  Other (See Comments)    cough cough     REVIEW OF SYSTEMS:   ROS As per history of present illness. All pertinent systems were reviewed above. Constitutional, HEENT, cardiovascular, respiratory, GI, GU, musculoskeletal, neuro, psychiatric, endocrine, integumentary and hematologic systems were reviewed and are otherwise negative/unremarkable except for positive findings mentioned above in the HPI.   MEDICATIONS AT HOME:   Prior to Admission medications   Medication Sig Start Date End Date Taking? Authorizing Provider  ascorbic acid (VITAMIN C) 500 MG tablet Take 500 mg by mouth daily.    [provider]  Azelaic Acid 15 % gel Apply to face every morning 08/26/22   Willeen Niece, MD   cetirizine-pseudoephedrine (ZYRTEC-D) 5-120 MG tablet Take 1 tablet by mouth 2 (two) times daily. 07/08/22   Aura Dials T, NP  Cholecalciferol 25 MCG (1000 UT) tablet Take 1 tablet (1,000 Units total) by mouth daily. 11/22/21   Cannady, Corrie Dandy T, NP  clobetasol cream (TEMOVATE) 0.05 % Apply 1 application topically 2 (two) times daily. Apply to affected area 01/09/21   Federico Flake, MD  cromolyn (OPTICROM) 4 % ophthalmic solution Place 1 drop into both eyes 4 (four) times daily. 12/10/22   Cannady, Corrie Dandy T, NP  cyanocobalamin (VITAMIN B12) 1000 MCG tablet Take 1,000 mcg by mouth daily.    [provider]  diclofenac Sodium (VOLTAREN) 1 % GEL Apply topically as needed.    [provider]  diltiazem 2 % GEL Apply 1 Application topically 3 (three) times daily. Inside the rectum as directed. 08/21/22   Meredith Pel, NP  ELIDEL 1 % cream Apply to red scaly areas on the face BID for seb derm 04/09/22   Willeen Niece, MD  fluticasone [facility]) 50 MCG/ACT nasal spray Place 2 sprays into both nostrils daily. Patient taking differently: Place 2 sprays into both nostrils as needed. 12/13/20 12/13/21  Vigg, Roma Schanz, MD  hydrALAZINE (APRESOLINE) 25 MG tablet Take 1 tablet (25 mg total) by mouth 2 (two) times daily. 12/10/22   Cannady, Corrie Dandy T, NP  hydrochlorothiazide (HYDRODIURIL) 12.5 MG tablet Take 12.5 mg by mouth daily.    [provider]  ibuprofen (ADVIL) 200 MG tablet Take 1 tablet (200 mg total) by mouth every 6 (six) hours as needed. 09/01/22   Cannady, Corrie Dandy T, NP  Ivermectin (SOOLANTRA) 1 % CREA Apply 1 Application topically at bedtime. 09/04/21   Willeen Niece, MD  levothyroxine (SYNTHROID) 112 MCG tablet Take 1 tablet (112 mcg total) by mouth daily. 10/31/22   Cannady, Corrie Dandy T, NP  metoprolol succinate (TOPROL-XL) 50 MG 24 hr tablet Take 1 tablet (50 mg total) by mouth in the morning and at bedtime. 11/25/21   Cannady, Corrie Dandy T, NP  minoxidil (LONITEN) 2.5 MG  tablet Take 1 tablet (2.5 mg total) by mouth daily. 02/25/22   Willeen Niece, MD  omeprazole (PRILOSEC) 40 MG capsule Take 1 capsule (40 mg total) by mouth daily. 03/21/22 03/22/23  Cannady, Corrie Dandy T, NP  valACYclovir (VALTREX) 1000 MG tablet Take 1,000 mg by mouth daily. 08/12/22   [provider]      VITAL SIGNS:  Blood pressure 107/71, pulse 85, temperature 97.9 F (36.6 C), resp. rate (!) 22, height 5\' 3"  (1.6 m), weight 84.5 kg, SpO2 91%.  PHYSICAL EXAMINATION:  Physical Exam  GENERAL:  57 y.o.-year-old patient lying in the bed with no acute distress.  EYES: Pupils equal, round, reactive to light and accommodation. No scleral icterus. Extraocular muscles intact.  HEENT:  Head atraumatic, normocephalic. Oropharynx and nasopharynx clear.  NECK:  Supple, no jugular venous distention. No thyroid enlargement, no tenderness.  LUNGS: Mild diminished bibasal breath sounds with no wheezing, rales,rhonchi or crepitation. No use of accessory muscles of respiration.  CARDIOVASCULAR: Regular rate and rhythm, S1, S2 normal. No murmurs, rubs, or gallops.  ABDOMEN: Soft, nondistended, nontender. Bowel sounds present. No organomegaly or mass.  EXTREMITIES: No pedal edema, cyanosis, or clubbing.  NEUROLOGIC: Cranial nerves II through XII are intact. Muscle strength 5/5 in all extremities. Sensation intact. Gait not checked.  PSYCHIATRIC: The patient is alert and oriented x 3.  Normal affect and good eye contact. SKIN: No obvious rash, lesion, or ulcer.   LABORATORY PANEL:   CBC Recent Labs  Lab 12/10/22 1838  WBC 10.0  HGB 18.0*  HCT 53.7*  PLT 340   ------------------------------------------------------------------------------------------------------------------  Chemistries  Recent Labs  Lab 12/10/22 1838 12/10/22 2021  NA 140  --   K 3.3*  --   CL 105  --   CO2 22  --   GLUCOSE 131*  --   BUN 20  --   CREATININE 0.75  --   CALCIUM 9.7  --   AST  --  20  ALT  --  25   ALKPHOS  --  58  BILITOT  --  2.1*   ------------------------------------------------------------------------------------------------------------------  Cardiac Enzymes No results for input(s): "TROPONINI" in the last 168 hours. ------------------------------------------------------------------------------------------------------------------  RADIOLOGY:  CT Angio Chest PE W and/or Wo Contrast  Result Date: 12/10/2022 CLINICAL DATA:  Chest pain for 2 weeks, initial encounter EXAM: CT ANGIOGRAPHY CHEST WITH CONTRAST TECHNIQUE: Multidetector CT imaging of the chest was performed using the standard protocol during bolus administration of intravenous contrast. Multiplanar CT image reconstructions and MIPs were obtained to evaluate the vascular anatomy. RADIATION DOSE REDUCTION: This exam was performed according to the departmental dose-optimization program which includes automated exposure control, adjustment of the mA and/or kV according to patient size and/or use of iterative reconstruction technique. CONTRAST:  75mL OMNIPAQUE IOHEXOL 350 MG/ML SOLN COMPARISON:  Chest x-ray from earlier in the same day. FINDINGS: Cardiovascular: Pulmonary artery shows a normal branching pattern. No filling defect to suggest pulmonary embolism is identified. The ascending aorta is incompletely evaluated due to the timing of the contrast bolus. No aneurysmal dilatation is seen. Some adjacent fluid attenuation is noted along the ascending aorta. This is felt to represent the superior pericardial reflection. No hyperdense crescent to suggest acute aortic injury is noted. Heart is not significantly enlarged in size. Mediastinum/Nodes: Thoracic inlet is within normal limits. No hilar or mediastinal adenopathy is noted. The esophagus as visualized is within normal limits. Lungs/Pleura: Lungs are well aerated bilaterally. No focal infiltrate or effusion is seen. No parenchymal nodules are noted. Upper Abdomen: Visualized upper  abdomen shows no acute abnormality. Musculoskeletal: Left breast implant is seen. No acute bony abnormality is noted. Review of the MIP images confirms the above findings. IMPRESSION: No evidence of pulmonary emboli. No acute abnormality seen. Electronically Signed   By: Alcide Clever M.D.   On: 12/10/2022 21:52   DG Chest 2 View  Result Date: 12/10/2022 CLINICAL DATA:  Chest pain and shortness of breath EXAM: CHEST - 2 VIEW COMPARISON:  None Available. FINDINGS: Low lung volumes accentuate mediastinal silhouette and pulmonary vascularity. bibasilar atelectasis. No pleural effusion or pneumothorax. No displaced rib fractures. IMPRESSION: Low lung volumes with bibasilar atelectasis. Electronically Signed   By: Minerva Fester M.D.   On: 12/10/2022 19:41  IMPRESSION AND PLAN:  Assessment and Plan: * Chest pain - This is associated with abnormal EKG without previous ones for comparison. - The patient will be admitted to an observation cardiac telemetry bed. - Will follow serial troponins and EKGs. - The patient will be placed on aspirin as well as p.r.n. sublingual nitroglycerin and morphine sulfate for pain. - We will obtain a cardiology consult in a.m. for further cardiac risk stratification. - I notified [facility] group about the patient   Dyspnea - She has elevated BN P without other significant symptoms of CHF. - Will obtain a 2D echo. - Cardiology consult will be obtained as mentioned above. - She was given a dose of 20 mg of IV Lasix we will continue as needed  Hypokalemia - Potassium will be replaced.  Hypertensive urgency - This could be contributing to new onset CHF. - We will continue her antihypertensive therapy.  GERD without esophagitis - We will continue PPI therapy.  Hypothyroidism - We will resume Synthroid.   DVT prophylaxis: Lovenox. Advanced Care Planning:  Code Status: full code. Family Communication:  The plan of care was discussed in details with the patient  (and family). I answered all questions. The patient agreed to proceed with the above mentioned plan. Further management will depend upon hospital course. Disposition Plan: Back to previous home environment Consults called: Cardiology All the records are reviewed and case discussed with ED provider.  Status is: Observation   I certify that at the time of admission, it is my clinical judgment that the patient will require  hospital care extending less  than 2 midnights.                            Dispo: The patient is from: Home              Anticipated d/c is to: Home              Patient currently is not medically stable to d/c.              Difficult to place patient: No  Hannah Beat M.D on 12/11/2022 at 4:08 AM  [facility]   From 7 PM-7 AM, contact night-coverage [email or web address]  CC: Primary care physician; Tonya Skiff, NP

## 2022-12-10 NOTE — Assessment & Plan Note (Addendum)
New onset over past 2 weeks with SOB presenting, one episode recently she reports struggling a lot with breathing.  EKG in office today abnormal compared to past EKG write ups. Recommend she start Baby ASA daily.  Urgent referral to cardiology placed for further assessment.  Labs: BNP, CBC, A1c.  Other labs up to date recently and reassuring.  Order for echo placed + CXR.  Have instructed her if worsening chest pain or SOB she is immediately to go to ER.

## 2022-12-10 NOTE — Assessment & Plan Note (Signed)
Noted on past labs, check level today and recommend diet focus.  Level 5.7% last visit, highly recommend diet and exercise focus.

## 2022-12-10 NOTE — Progress Notes (Signed)
BP (!) 144/92 (BP Location: Right Arm, Patient Position: Sitting, Cuff Size: Large)   Pulse 87   Temp 97.6 F (36.4 C) (Oral)   Resp 18   Ht 5\' 3"  (1.6 m) Comment: per patient  Wt 186 lb 3.2 oz (84.5 kg)   LMP  (LMP Unknown)   SpO2 98%   BMI 32.98 kg/m    Subjective:    Patient ID: Tonya Odonnell, female    DOB: 06/09/1965, 57 y.o.   MRN: [id number]  HPI: Tonya Odonnell is a 57 y.o. female  Chief Complaint  Patient presents with   Follow-up    Been going to PT and its exhausting her and it causes her to have SOB, pain in throat (thyroid hx)   Cough   HYPERTENSION without Chronic Kidney Disease Taking Metoprolol and Hydrochlorothiazide.  Per records in past had side effects with multiple BP medications: Lisinopril (cough), Losartan, Amlodipine, Hydrochlorothiazide, Triamterene.  She is attending PT for her back and is reporting this is exhausting and causes her to become SOB and has pain in throat (recent thyroid imaging and labs reassuring), reports inflammation is going down to neck.  States the more she moves the more she hurts.  Feels that fluids and inflammation become more with PT.  When pushing up from wall and swimming moves with PT she reports more chest pain.  Reports that PT is offering no benefit.  Has history of breast CA and return of this is a major concern for her.  SOB has been all the time for weeks, started with weather change -- using Flonase.  Does not feel good, very tired.   Hypertension status: uncontrolled  Satisfied with current treatment? yes Duration of hypertension: chronic BP monitoring frequency:  not checking BP range:  BP medication side effects:  no Medication compliance: good compliance Aspirin: no Recurrent headaches: no Visual changes: no Palpitations: no Dyspnea: as in notes Chest pain: as in notes Lower extremity edema:  at baseline Dizzy/lightheaded: no   COUGH Started having a dry cough every day, started taking Omeprazole 40 MG  daily.  Reports she has been having more chest pain, especially with PT, so trying this.  Was about to go to ER the other day as having chest pain and shortness of breath -- where she felt like she could not breath. Cough started same time as Levothyroxine was changed, reports her face felt swollen -- this improved with water pills she reports.  If moves her neck up or down she starts coughing.  When swallows she has some pain.   Duration: weeks Circumstances of initial development of cough: nothing Cough severity: mild Cough description: dry Aggravating factors:  neck movement Alleviating factors: hydrochlorothiazide and omeprazole (this has helped some) Status:  fluctuating Treatments attempted: as above Wheezing: no Shortness of breath: yes Chest pain: yes Chest tightness:no Nasal congestion: no Runny nose: no Postnasal drip: no Frequent throat clearing or swallowing: no Hemoptysis: no Fevers: no Night sweats: no Weight loss: no Heartburn: no Recent foreign travel: no Tuberculosis contacts: no   Relevant past medical, surgical, family and social history reviewed and updated as indicated. Interim medical history since our last visit reviewed. Allergies and medications reviewed and updated.  Review of Systems  Constitutional:  Positive for fatigue. Negative for activity change, appetite change, diaphoresis and fever.  Respiratory:  Positive for cough and shortness of breath. Negative for chest tightness and wheezing.   Cardiovascular:  Positive for chest pain. Negative for palpitations and leg  swelling.  Endocrine: Negative for cold intolerance and heat intolerance.  Neurological: Negative.   Psychiatric/Behavioral: Negative.     Per HPI unless specifically indicated above     Objective:    BP (!) 144/92 (BP Location: Right Arm, Patient Position: Sitting, Cuff Size: Large)   Pulse 87   Temp 97.6 F (36.4 C) (Oral)   Resp 18   Ht 5\' 3"  (1.6 m) Comment: per patient  Wt  186 lb 3.2 oz (84.5 kg)   LMP  (LMP Unknown)   SpO2 98%   BMI 32.98 kg/m   Wt Readings from Last 3 Encounters:  12/10/22 186 lb 3.2 oz (84.5 kg)  11/07/22 189 lb 6.4 oz (85.9 kg)  10/29/22 189 lb 9.6 oz (86 kg)    Physical Exam Vitals and nursing note reviewed.  Constitutional:      General: She is awake. She is not in acute distress.    Appearance: She is well-developed and well-groomed. She is obese. She is not ill-appearing or toxic-appearing.  HENT:     Head: Normocephalic.     Right Ear: Hearing and external ear normal.     Left Ear: Hearing and external ear normal.  Eyes:     General: Lids are normal.        Right eye: No discharge.        Left eye: No discharge.     Conjunctiva/sclera: Conjunctivae normal.     Pupils: Pupils are equal, round, and reactive to light.  Neck:     Thyroid: No thyromegaly.     Vascular: No carotid bruit.  Cardiovascular:     Rate and Rhythm: Normal rate and regular rhythm.     Heart sounds: Normal heart sounds. No murmur heard.    No gallop.  Pulmonary:     Effort: Pulmonary effort is normal. No accessory muscle usage or respiratory distress.     Breath sounds: Normal breath sounds.  Abdominal:     General: Bowel sounds are normal. There is no distension.     Palpations: Abdomen is soft.     Tenderness: There is no abdominal tenderness.  Musculoskeletal:     Cervical back: Normal range of motion and neck supple.     Right lower leg: Edema (trace) present.     Left lower leg: Edema (trace) present.  Lymphadenopathy:     Head:     Right side of head: No submental, submandibular, tonsillar, preauricular, posterior auricular or occipital adenopathy.     Left side of head: No submental, submandibular, tonsillar, preauricular, posterior auricular or occipital adenopathy.     Cervical: No cervical adenopathy.  Skin:    General: Skin is warm and dry.  Neurological:     Mental Status: She is alert and oriented to person, place, and time.      Deep Tendon Reflexes: Reflexes are normal and symmetric.     Reflex Scores:      Brachioradialis reflexes are 2+ on the right side and 2+ on the left side.      Patellar reflexes are 2+ on the right side and 2+ on the left side. Psychiatric:        Attention and Perception: Attention normal.        Mood and Affect: Mood normal.        Speech: Speech normal.        Behavior: Behavior normal. Behavior is cooperative.        Thought Content: Thought content normal.   EKG  My review and personal interpretation at Time: 1500    Indication: chest pain with SOB  Rate: 88  Rhythm: sinus Axis: normal Other: T wave inversion and ST depression noted Unable to compare to past EKGs, imaging not in Care Everywhere, bu write up is and EKG is different in comparison to past ones.  Results for orders placed or performed in visit on 10/30/22  [facility] MAMMOGRAPHY  Result Value Ref Range   [facility] Mammogram 0-4 Bi-Rad 0-4 Bi-Rad, Self Reported Normal      Assessment & Plan:   Problem List Items Addressed This Visit       Cardiovascular and Mediastinum   Essential (primary) hypertension    Chronic, ongoing.  BP elevations today.  Recommend she monitor BP at least a few mornings a week at home and document.  DASH diet at home.  Continue Metoprolol at current dosing and HCTZ 12.5 MG  daily + will add on Hydralazine 25 MG BID to start as she is very sensitive to medications and did not tolerate many BP medications in past.  Labs: CBC, BNP, A1c.  Urine ALB 80 April 2024, may benefit ARB trial in future.  Did not tolerate ACE.        Relevant Medications   hydrALAZINE (APRESOLINE) 25 MG tablet     Endocrine   Hashimoto's thyroiditis    Chronic, ongoing. Continue current medication regimen and adjust as needed based on labs.  Recent TSH normal and Free T4 remains elevated. She would like to see endocrinology, which is appropriate as does continue to be symptomatic.      Relevant Orders   Ambulatory referral to  Endocrinology     Other   Chest pain    New onset over past 2 weeks with SOB presenting, one episode recently she reports struggling a lot with breathing.  EKG in office today abnormal compared to past EKG write ups. Recommend she start Baby ASA daily.  Urgent referral to cardiology placed for further assessment.  Labs: BNP, CBC, A1c.  Other labs up to date recently and reassuring.  Order for echo placed + CXR.  Have instructed her if worsening chest pain or SOB she is immediately to go to ER.        Relevant Orders   EKG 12-Lead (Completed)   Ambulatory referral to Cardiology   Prediabetes    Noted on past labs, check level today and recommend diet focus.  Level 5.7% last visit, highly recommend diet and exercise focus.      SOB (shortness of breath) - Primary    New onset over past 2 weeks with chest pain presenting, one episode recently she reports struggling a lot with breathing.  EKG in office today abnormal compared to past EKG write ups. Recommend she start Baby ASA daily.  Urgent referral to cardiology placed for further assessment.  Labs: BNP, CBC, A1c.  Other labs up to date recently and reassuring.  Order for echo placed + CXR.      Relevant Orders   DG Chest 2 View   B Nat Peptide   CBC with Differential/Platelet   HgB A1c   ECHOCARDIOGRAM COMPLETE   EKG 12-Lead (Completed)   Ambulatory referral to Cardiology   Other Visit Diagnoses     Abnormal EKG       Referral to cardiology.  Refer to SOB and CP plan of care.   Relevant Orders   Ambulatory referral to Cardiology       Time: 25  minutes, >50% spent counseling/or care coordination   Follow up plan: Return in about 1 week (around 12/17/2022) for CHEST PAIN.

## 2022-12-10 NOTE — Assessment & Plan Note (Signed)
Chronic, ongoing. Continue current medication regimen and adjust as needed based on labs.  Recent TSH normal and Free T4 remains elevated. She would like to see endocrinology, which is appropriate as does continue to be symptomatic.

## 2022-12-10 NOTE — ED Triage Notes (Signed)
Pt in with chest pain x 2 wks, states she saw her PCP earlier today and had labs and EKG drawn, and the EKG was abnormal from previous. Pt told to come to ED for further eval. Reporting sob and anxiety as well. Pain and numbness is radiating into L arm

## 2022-12-10 NOTE — Assessment & Plan Note (Signed)
Chronic, ongoing.  BP elevations today.  Recommend she monitor BP at least a few mornings a week at home and document.  DASH diet at home.  Continue Metoprolol at current dosing and HCTZ 12.5 MG  daily + will add on Hydralazine 25 MG BID to start as she is very sensitive to medications and did not tolerate many BP medications in past.  Labs: CBC, BNP, A1c.  Urine ALB 80 April 2024, may benefit ARB trial in future.  Did not tolerate ACE.

## 2022-12-11 ENCOUNTER — Encounter: Payer: Self-pay | Admitting: Nurse Practitioner

## 2022-12-11 ENCOUNTER — Ambulatory Visit: Payer: Medicaid Other | Admitting: Rehabilitation

## 2022-12-11 DIAGNOSIS — E669 Obesity, unspecified: Secondary | ICD-10-CM | POA: Diagnosis not present

## 2022-12-11 DIAGNOSIS — R0609 Other forms of dyspnea: Secondary | ICD-10-CM | POA: Diagnosis not present

## 2022-12-11 DIAGNOSIS — I10 Essential (primary) hypertension: Secondary | ICD-10-CM | POA: Diagnosis not present

## 2022-12-11 DIAGNOSIS — R0602 Shortness of breath: Secondary | ICD-10-CM | POA: Diagnosis not present

## 2022-12-11 DIAGNOSIS — E876 Hypokalemia: Secondary | ICD-10-CM

## 2022-12-11 DIAGNOSIS — R9431 Abnormal electrocardiogram [ECG] [EKG]: Principal | ICD-10-CM

## 2022-12-11 DIAGNOSIS — R0789 Other chest pain: Secondary | ICD-10-CM

## 2022-12-11 DIAGNOSIS — E785 Hyperlipidemia, unspecified: Secondary | ICD-10-CM

## 2022-12-11 DIAGNOSIS — E039 Hypothyroidism, unspecified: Secondary | ICD-10-CM | POA: Insufficient documentation

## 2022-12-11 DIAGNOSIS — R079 Chest pain, unspecified: Secondary | ICD-10-CM | POA: Diagnosis not present

## 2022-12-11 DIAGNOSIS — G894 Chronic pain syndrome: Secondary | ICD-10-CM | POA: Diagnosis not present

## 2022-12-11 DIAGNOSIS — K219 Gastro-esophageal reflux disease without esophagitis: Secondary | ICD-10-CM | POA: Insufficient documentation

## 2022-12-11 DIAGNOSIS — I160 Hypertensive urgency: Secondary | ICD-10-CM | POA: Diagnosis not present

## 2022-12-11 DIAGNOSIS — R0600 Dyspnea, unspecified: Secondary | ICD-10-CM

## 2022-12-11 LAB — LIPID PANEL
Cholesterol: 226 mg/dL — ABNORMAL HIGH (ref 0–200)
HDL: 65 mg/dL (ref >40)
LDL Cholesterol: 145 mg/dL — ABNORMAL HIGH (ref 0–99)
Total CHOL/HDL Ratio: 3.5 {ratio}
Triglycerides: 78 mg/dL (ref <150)
VLDL: 16 mg/dL (ref 0–40)

## 2022-12-11 LAB — BASIC METABOLIC PANEL
Anion gap: 12 (ref 5–15)
BUN: 16 mg/dL (ref 6–20)
CO2: 18 mmol/L — ABNORMAL LOW (ref 22–32)
Calcium: 8.9 mg/dL (ref 8.9–10.3)
Chloride: 106 mmol/L (ref 98–111)
Creatinine, Ser: 0.7 mg/dL (ref 0.44–1.00)
GFR, Estimated: >60 mL/min (ref >60)
Glucose, Bld: 110 mg/dL — ABNORMAL HIGH (ref 70–99)
Potassium: 4.1 mmol/L (ref 3.5–5.1)
Sodium: 136 mmol/L (ref 135–145)

## 2022-12-11 LAB — MAGNESIUM: Magnesium: 2 mg/dL (ref 1.7–2.4)

## 2022-12-11 LAB — TROPONIN I (HIGH SENSITIVITY): Troponin I (High Sensitivity): 8 ng/L (ref <18)

## 2022-12-11 LAB — HIV ANTIBODY (ROUTINE TESTING W REFLEX): HIV Screen 4th Generation wRfx: NONREACTIVE

## 2022-12-11 MED ORDER — POTASSIUM CHLORIDE 20 MEQ PO PACK
40.0000 meq | PACK | Freq: Once | ORAL | Status: AC
  Administered 2022-12-11: 40 meq via ORAL
  Filled 2022-12-11: qty 2

## 2022-12-11 MED ORDER — MINOXIDIL 2.5 MG PO TABS
1.2500 mg | ORAL_TABLET | Freq: Every day | ORAL | Status: DC
  Administered 2022-12-12 – 2022-12-13 (×2): 1.25 mg via ORAL
  Filled 2022-12-11 (×2): qty 0.5

## 2022-12-11 MED ORDER — BUTALBITAL-APAP-CAFFEINE 50-325-40 MG PO TABS
1.0000 | ORAL_TABLET | Freq: Four times a day (QID) | ORAL | Status: DC | PRN
  Administered 2022-12-11 (×2): 1 via ORAL
  Filled 2022-12-11 (×2): qty 1

## 2022-12-11 NOTE — Progress Notes (Signed)
Progress Note   Patient: Tonya Odonnell WUJ:[id number] DOB: 21-Mar-1965 DOA: 12/10/2022     0 DOS: the patient was seen and examined on 12/11/2022   Brief hospital course: Tonya Odonnell is a 57 y.o. Caucasian female with medical history significant for breast cancer s/p mastectomy, hypertension, dyslipidemia and hypothyroidism, who presented to emergency room with acute onset of dyspnea on exertion as well as intermittent chest pain felt as pressure over the last couple of weeks worsening with exertion as well as generalized weakness.  She was recently given diuretic therapy for lower extremity edema.  He describes the chest pain location: Dull aching substernal chest pressure.  She admits to dry cough without wheezing or hemoptysis.  Admits pain after she had exercise, pain more with moving her extremities, also has chest wall tenderness. She is admitted for chest pain, sob with exertion for cardiac evaluation.  Assessment and Plan: * Chest pain/ dyspnea on exertion Pain atypical, likely musculoskeletal. BNP high.  Cardiology advised echocardiogram. She can be discharged if echo unremarkable. Continue aspirin. Telemetry monitoring.  Hypokalemia Improved after replaced.  Hypertensive urgency BP improved. Her home regimen, metoprolol, minoxidil, hydrochlorothiazide, hydralazine resumed.  Migraine headache- fioricet prn ordered.  GERD without esophagitis Continue PPI therapy.  Hypothyroidism Resumed Synthroid.  Obesity with BMI 32.9 Diet, exercise and weight reduction advised. Encouraged life style modification.       Out of bed to chair. Incentive spirometry. Nursing supportive care. Fall, aspiration precautions. DVT prophylaxis   Code Status: Full Code  Subjective: Patient is seen and examined today morning. She complains of headache. BP improved. She has no chest discomfort now.   Physical Exam: Vitals:   12/11/22 1400 12/11/22 1526 12/11/22 1700 12/11/22 1719  BP:  126/82  (!) 147/101 (!) 143/101  Pulse: 93  93 (!) 101  Resp: (!) 21  20 16   Temp:  98 F (36.7 C)  97.6 F (36.4 C)  TempSrc:  Oral    SpO2: 93%  96% 96%  Weight:      Height:        General -  Middle aged obese female, distress due to headache HEENT - PERRLA, EOMI, atraumatic head, non tender sinuses. Lung - Clear, distant breath sounds, no rales, rhonchi, wheezes. Heart - S1, S2 heard, no murmurs, rubs, trace pedal edema. Abdomen - Soft, non tender obese, bowel sounds good Neuro - Alert, awake and oriented x 3, non focal exam. Skin - Warm and dry.  Data Reviewed:      Latest Ref Rng & Units 12/10/2022    6:38 PM 12/10/2022    3:14 PM 10/29/2022   11:36 AM  CBC  WBC 4.0 - 10.5 K/uL 10.0  8.1  6.9   Hemoglobin 12.0 - 15.0 g/dL 95.6  21.3  08.6   Hematocrit 36.0 - 46.0 % 53.7  55.6  49.1   Platelets 150 - 400 K/uL 340  305  304       Latest Ref Rng & Units 12/11/2022   10:26 AM 12/10/2022    6:38 PM 09/17/2022    8:01 AM  BMP  Glucose 70 - 99 mg/dL 578  469  97   BUN 6 - 20 mg/dL 16  20  27    Creatinine 0.44 - 1.00 mg/dL 6.29  5.28  4.13   Sodium 135 - 145 mmol/L 136  140  138   Potassium 3.5 - 5.1 mmol/L 4.1  3.3  4.1   Chloride 98 - 111 mmol/L 106  105  108   CO2 22 - 32 mmol/L 18  22  22    Calcium 8.9 - 10.3 mg/dL 8.9  9.7  9.1    CT Angio Chest PE W and/or Wo Contrast  Result Date: 12/10/2022 CLINICAL DATA:  Chest pain for 2 weeks, initial encounter EXAM: CT ANGIOGRAPHY CHEST WITH CONTRAST TECHNIQUE: Multidetector CT imaging of the chest was performed using the standard protocol during bolus administration of intravenous contrast. Multiplanar CT image reconstructions and MIPs were obtained to evaluate the vascular anatomy. RADIATION DOSE REDUCTION: This exam was performed according to the departmental dose-optimization program which includes automated exposure control, adjustment of the mA and/or kV according to patient size and/or use of iterative reconstruction  technique. CONTRAST:  75mL OMNIPAQUE IOHEXOL 350 MG/ML SOLN COMPARISON:  Chest x-ray from earlier in the same day. FINDINGS: Cardiovascular: Pulmonary artery shows a normal branching pattern. No filling defect to suggest pulmonary embolism is identified. The ascending aorta is incompletely evaluated due to the timing of the contrast bolus. No aneurysmal dilatation is seen. Some adjacent fluid attenuation is noted along the ascending aorta. This is felt to represent the superior pericardial reflection. No hyperdense crescent to suggest acute aortic injury is noted. Heart is not significantly enlarged in size. Mediastinum/Nodes: Thoracic inlet is within normal limits. No hilar or mediastinal adenopathy is noted. The esophagus as visualized is within normal limits. Lungs/Pleura: Lungs are well aerated bilaterally. No focal infiltrate or effusion is seen. No parenchymal nodules are noted. Upper Abdomen: Visualized upper abdomen shows no acute abnormality. Musculoskeletal: Left breast implant is seen. No acute bony abnormality is noted. Review of the MIP images confirms the above findings. IMPRESSION: No evidence of pulmonary emboli. No acute abnormality seen. Electronically Signed   By: Alcide Clever M.D.   On: 12/10/2022 21:52   DG Chest 2 View  Result Date: 12/10/2022 CLINICAL DATA:  Chest pain and shortness of breath EXAM: CHEST - 2 VIEW COMPARISON:  None Available. FINDINGS: Low lung volumes accentuate mediastinal silhouette and pulmonary vascularity. bibasilar atelectasis. No pleural effusion or pneumothorax. No displaced rib fractures. IMPRESSION: Low lung volumes with bibasilar atelectasis. Electronically Signed   By: Minerva Fester M.D.   On: 12/10/2022 19:41     Family Communication: Discussed with patient, she understands and agrees. All questions answereed.    Disposition: Status is: Observation The patient remains OBS appropriate and will d/c before 2 midnights.  Planned Discharge Destination:  Home     Time spent: 37 minutes  Author: Marcelino Duster, MD 12/11/2022 6:19 PM Secure chat 7am to 7pm For on call review [email or web address].

## 2022-12-11 NOTE — ED Notes (Signed)
ED TO INPATIENT HANDOFF REPORT  ED Nurse Name and Phone #: Leonette Nutting  S Name/Age/Gender Tonya Odonnell 57 y.o. female Room/Bed: ED16A/ED16A  Code Status   Code Status: Full Code  Home/SNF/Other Home Patient oriented to: self, place, time, and situation Is this baseline? Yes   Triage Complete: Triage complete  Chief Complaint Chest pain [R07.9]  Triage Note Pt in with chest pain x 2 wks, states she saw her PCP earlier today and had labs and EKG drawn, and the EKG was abnormal from previous. Pt told to come to ED for further eval. Reporting sob and anxiety as well. Pain and numbness is radiating into L arm   Allergies Allergies  Allergen Reactions   Lisinopril Other (See Comments)    cough cough     Level of Care/Admitting Diagnosis ED Disposition     ED Disposition  Admit   Condition  --   Comment  Hospital Area: [facility] [[id number]]  Level of Care: Telemetry Cardiac [103]  Covid Evaluation: Asymptomatic - no recent exposure (last 10 days) testing not required  Diagnosis: Chest pain [744799]  Admitting Physician: Hannah Beat [[id number]]  Attending Physician: Hannah Beat [[id number]]          B Medical/Surgery History Past Medical History:  Diagnosis Date   Anemia    "USE TO HAVE IT,NOT NOW,2015,2016   Arthritis    HIPS,KNEES,HANDS   Breast cancer in female (HCC)    Cancer (HCC)    LEFT   Hyperlipidemia    Hypertension    Low iron    Neuromuscular disorder (HCC)    MUSCLE PAIN,FIBROMYALGIA   Osteopenia    PVC (premature ventricular contraction)    Thyroid disease    Past Surgical History:  Procedure Laterality Date   Left Ovarian removal Left    MASTECTOMY Left    "LYMPHNODES REMOVED     A IV Location/Drains/Wounds Patient Lines/Drains/Airways Status     Active Line/Drains/Airways     Name Placement date Placement time Site Days   Peripheral IV 12/10/22 20 G Anterior;Right Forearm 12/10/22  1910  Forearm  1             Intake/Output Last 24 hours  Intake/Output Summary (Last 24 hours) at 12/11/2022 1547 Last data filed at 12/11/2022 0935 Gross per 24 hour  Intake 978.61 ml  Output 1400 ml  Net -421.39 ml    Labs/Imaging Results for orders placed or performed during the hospital encounter of 12/10/22 (from the past 48 hour(s))  Basic metabolic panel     Status: Abnormal   Collection Time: 12/10/22  6:38 PM  Result Value Ref Range   Sodium 140 135 - 145 mmol/L   Potassium 3.3 (L) 3.5 - 5.1 mmol/L   Chloride 105 98 - 111 mmol/L   CO2 22 22 - 32 mmol/L   Glucose, Bld 131 (H) 70 - 99 mg/dL    Comment: Glucose reference range applies only to samples taken after fasting for at least 8 hours.   BUN 20 6 - 20 mg/dL   Creatinine, Ser 1.91 0.44 - 1.00 mg/dL   Calcium 9.7 8.9 - 47.8 mg/dL   GFR, Estimated >29 >56 mL/min    Comment: (NOTE) Calculated using the CKD-EPI Creatinine Equation (2021)    Anion gap 13 5 - 15    Comment: Performed at [facility], [address], [place], [place] [address]  CBC     Status: Abnormal   Collection Time: 12/10/22  6:38 PM  Result Value Ref Range   WBC 10.0 4.0 - 10.5 K/uL   RBC 6.24 (H) 3.87 - 5.11 MIL/uL   Hemoglobin 18.0 (H) 12.0 - 15.0 g/dL   HCT 28.4 (H) 13.2 - 44.0 %   MCV 86.1 80.0 - 100.0 fL   MCH 28.8 26.0 - 34.0 pg   MCHC 33.5 30.0 - 36.0 g/dL   RDW 10.2 72.5 - 36.6 %   Platelets 340 150 - 400 K/uL   nRBC 0.0 0.0 - 0.2 %    Comment: Performed at [facility], [address], [place], [place] [address]  Troponin I (High Sensitivity)     Status: None   Collection Time: 12/10/22  6:38 PM  Result Value Ref Range   Troponin I (High Sensitivity) 9 <18 ng/L    Comment: (NOTE) Elevated high sensitivity troponin I (hsTnI) values and significant  changes across serial measurements may suggest ACS but many other  chronic and acute conditions are known to elevate hsTnI results.  Refer to the "Links" section for chest pain  algorithms and additional  guidance. Performed at [facility], [address] [place], [place] [address]   Brain natriuretic peptide     Status: Abnormal   Collection Time: 12/10/22  8:21 PM  Result Value Ref Range   B Natriuretic Peptide 275.2 (H) 0.0 - 100.0 pg/mL    Comment: Performed at [facility], [address] [place], [place] [address]  Lactic acid, plasma     Status: None   Collection Time: 12/10/22  8:21 PM  Result Value Ref Range   Lactic Acid, Venous 0.9 0.5 - 1.9 mmol/L    Comment: Performed at [facility], [address] [place], [place] [address]  Hepatic function panel     Status: Abnormal   Collection Time: 12/10/22  8:21 PM  Result Value Ref Range   Total Protein 6.8 6.5 - 8.1 g/dL   Albumin 4.0 3.5 - 5.0 g/dL   AST 20 15 - 41 U/L   ALT 25 0 - 44 U/L   Alkaline Phosphatase 58 38 - 126 U/L   Total Bilirubin 2.1 (H) 0.3 - 1.2 mg/dL   Bilirubin, Direct 0.3 (H) 0.0 - 0.2 mg/dL   Indirect Bilirubin 1.8 (H) 0.3 - 0.9 mg/dL    Comment: Performed at [facility], [address] [place], [place] [address]  Troponin I (High Sensitivity)     Status: None   Collection Time: 12/10/22  8:21 PM  Result Value Ref Range   Troponin I (High Sensitivity) 11 <18 ng/L    Comment: (NOTE) Elevated high sensitivity troponin I (hsTnI) values and significant  changes across serial measurements may suggest ACS but many other  chronic and acute conditions are known to elevate hsTnI results.  Refer to the "Links" section for chest pain algorithms and additional  guidance. Performed at [facility], [address] [place], [place] [address]   HIV Antibody (routine testing w rflx)     Status: None   Collection Time: 12/10/22 11:15 PM  Result Value Ref Range   HIV Screen 4th Generation wRfx Non Reactive Non Reactive    Comment: Performed at [facility], [address], [place], [place] [address]  Troponin I (High  Sensitivity)     Status: None   Collection Time: 12/10/22 11:15 PM  Result Value Ref Range   Troponin I (High Sensitivity) 14 <18 ng/L    Comment: (NOTE)  Elevated high sensitivity troponin I (hsTnI) values and significant  changes across serial measurements may suggest ACS but many other  chronic and acute conditions are known to elevate hsTnI results.  Refer to the "Links" section for chest pain algorithms and additional  guidance. Performed at [facility], [address] [place], [place] [address]   Lipid panel     Status: Abnormal   Collection Time: 12/11/22  2:45 AM  Result Value Ref Range   Cholesterol 226 (H) 0 - 200 mg/dL   Triglycerides 78 <865 mg/dL   HDL 65 >78 mg/dL   Total CHOL/HDL Ratio 3.5 RATIO   VLDL 16 0 - 40 mg/dL   LDL Cholesterol 469 (H) 0 - 99 mg/dL    Comment:        Total Cholesterol/HDL:CHD Risk Coronary Heart Disease Risk Table                     Men   Women  1/2 Average Risk   3.4   3.3  Average Risk       5.0   4.4  2 X Average Risk   9.6   7.1  3 X Average Risk  23.4   11.0        Use the calculated Patient Ratio above and the CHD Risk Table to determine the patient's CHD Risk.        ATP III CLASSIFICATION (LDL):  <100     mg/dL   Optimal  629-528  mg/dL   Near or Above                    Optimal  130-159  mg/dL   Borderline  413-244  mg/dL   High  >010     mg/dL   Very High Performed at [facility], [address] [place], [place] [address]   Troponin I (High Sensitivity)     Status: None   Collection Time: 12/11/22  2:45 AM  Result Value Ref Range   Troponin I (High Sensitivity) 8 <18 ng/L    Comment: (NOTE) Elevated high sensitivity troponin I (hsTnI) values and significant  changes across serial measurements may suggest ACS but many other  chronic and acute conditions are known to elevate hsTnI results.  Refer to the "Links" section for chest pain algorithms and additional  guidance. Performed at [facility], [address] [place], [place] [address]   Magnesium     Status: None   Collection Time: 12/11/22  2:45 AM  Result Value Ref Range   Magnesium 2.0 1.7 - 2.4 mg/dL    Comment: Performed at [facility], [address] [place], [place] [address]  Basic metabolic panel     Status: Abnormal   Collection Time: 12/11/22 10:26 AM  Result Value Ref Range   Sodium 136 135 - 145 mmol/L   Potassium 4.1 3.5 - 5.1 mmol/L   Chloride 106 98 - 111 mmol/L   CO2 18 (L) 22 - 32 mmol/L   Glucose, Bld 110 (H) 70 - 99 mg/dL    Comment: Glucose reference range applies only to samples taken after fasting for at least 8 hours.   BUN 16 6 - 20 mg/dL   Creatinine, Ser 5.95 0.44 - 1.00 mg/dL   Calcium 8.9 8.9 - 63.8 mg/dL   GFR, Estimated >75 >64 mL/min    Comment: (NOTE) Calculated using the CKD-EPI Creatinine Equation (2021)    Anion gap  12 5 - 15    Comment: Performed at [facility], [address] [place], [place] [address]   CT Angio Chest PE W and/or Wo Contrast  Result Date: 12/10/2022 CLINICAL DATA:  Chest pain for 2 weeks, initial encounter EXAM: CT ANGIOGRAPHY CHEST WITH CONTRAST TECHNIQUE: Multidetector CT imaging of the chest was performed using the standard protocol during bolus administration of intravenous contrast. Multiplanar CT image reconstructions and MIPs were obtained to evaluate the vascular anatomy. RADIATION DOSE REDUCTION: This exam was performed according to the departmental dose-optimization program which includes automated exposure control, adjustment of the mA and/or kV according to patient size and/or use of iterative reconstruction technique. CONTRAST:  75mL OMNIPAQUE IOHEXOL 350 MG/ML SOLN COMPARISON:  Chest x-ray from earlier in the same day. FINDINGS: Cardiovascular: Pulmonary artery shows a normal branching pattern. No filling defect to suggest pulmonary embolism is identified. The ascending aorta is incompletely evaluated due to the timing  of the contrast bolus. No aneurysmal dilatation is seen. Some adjacent fluid attenuation is noted along the ascending aorta. This is felt to represent the superior pericardial reflection. No hyperdense crescent to suggest acute aortic injury is noted. Heart is not significantly enlarged in size. Mediastinum/Nodes: Thoracic inlet is within normal limits. No hilar or mediastinal adenopathy is noted. The esophagus as visualized is within normal limits. Lungs/Pleura: Lungs are well aerated bilaterally. No focal infiltrate or effusion is seen. No parenchymal nodules are noted. Upper Abdomen: Visualized upper abdomen shows no acute abnormality. Musculoskeletal: Left breast implant is seen. No acute bony abnormality is noted. Review of the MIP images confirms the above findings. IMPRESSION: No evidence of pulmonary emboli. No acute abnormality seen. Electronically Signed   By: Alcide Clever M.D.   On: 12/10/2022 21:52   DG Chest 2 View  Result Date: 12/10/2022 CLINICAL DATA:  Chest pain and shortness of breath EXAM: CHEST - 2 VIEW COMPARISON:  None Available. FINDINGS: Low lung volumes accentuate mediastinal silhouette and pulmonary vascularity. bibasilar atelectasis. No pleural effusion or pneumothorax. No displaced rib fractures. IMPRESSION: Low lung volumes with bibasilar atelectasis. Electronically Signed   By: Minerva Fester M.D.   On: 12/10/2022 19:41    Pending Labs Unresulted Labs (From admission, onward)    None       Vitals/Pain Today's Vitals   12/11/22 1200 12/11/22 1300 12/11/22 1400 12/11/22 1526  BP: 115/80 109/75 126/82   Pulse: 99 85 93   Resp: (!) 23 18 (!) 21   Temp:    98 F (36.7 C)  TempSrc:    Oral  SpO2: 93% 93% 93%   Weight:      Height:      PainSc:        Isolation Precautions No active isolations  Medications Medications  hydrALAZINE (APRESOLINE) tablet 25 mg (25 mg Oral Given 12/11/22 0937)  hydrochlorothiazide (HYDRODIURIL) tablet 12.5 mg (12.5 mg Oral Given  12/11/22 0938)  metoprolol succinate (TOPROL-XL) 24 hr tablet 50 mg (50 mg Oral Given 12/11/22 0938)  minoxidil (LONITEN) tablet 2.5 mg (1.25 mg Oral Given 12/11/22 1027)  levothyroxine (SYNTHROID) tablet 112 mcg (112 mcg Oral Given 12/11/22 0639)  pantoprazole (PROTONIX) EC tablet 40 mg (40 mg Oral Given 12/11/22 0937)  cyanocobalamin (VITAMIN B12) tablet 1,000 mcg (1,000 mcg Oral Given 12/11/22 0937)  ascorbic acid (VITAMIN C) tablet 500 mg (500 mg Oral Given 12/11/22 0938)  cholecalciferol (VITAMIN D3) tablet 1,000 Units (1,000 Units Oral Given 12/11/22 0937)  fluticasone (FLONASE) 50 MCG/ACT nasal spray 2  spray (has no administration in time range)  acetaminophen (TYLENOL) tablet 650 mg (650 mg Oral Given 12/11/22 0338)  ondansetron (ZOFRAN) injection 4 mg (has no administration in time range)  enoxaparin (LOVENOX) injection 40 mg (40 mg Subcutaneous Given 12/11/22 0936)  ALPRAZolam (XANAX) tablet 0.25 mg (has no administration in time range)  magnesium hydroxide (MILK OF MAGNESIA) suspension 30 mL (has no administration in time range)  butalbital-acetaminophen-caffeine (FIORICET) 50-325-40 MG per tablet 1 tablet (1 tablet Oral Given 12/11/22 0955)  iohexol (OMNIPAQUE) 350 MG/ML injection 75 mL (75 mLs Intravenous Contrast Given 12/10/22 2104)  aspirin chewable tablet 324 mg (324 mg Oral Given 12/10/22 2221)  furosemide (LASIX) injection 20 mg (20 mg Intravenous Given 12/10/22 2223)  potassium chloride SA (KLOR-CON M) CR tablet 40 mEq (40 mEq Oral Given 12/10/22 2220)  nitroGLYCERIN (NITROGLYN) 2 % ointment 1 inch (1 inch Topical Given 12/10/22 2222)  potassium chloride (KLOR-CON) packet 40 mEq (40 mEq Oral Given 12/10/22 2328)  potassium chloride (KLOR-CON) packet 40 mEq (40 mEq Oral Given 12/11/22 0519)    Mobility walks     Focused Assessments     R Recommendations: See Admitting Provider Note  Report given to:   Additional Notes:

## 2022-12-11 NOTE — Consult Note (Signed)
Cardiology Consultation   Patient ID: Tonya Odonnell MRN: [id number]; DOB: 1965-05-13  Admit date: 12/10/2022 Date of Consult: 12/11/2022  PCP:  Marjie Skiff, NP   [facility] Providers Cardiologist:  None      New consult completed by Dr Mariah Milling  Patient Profile:   Tonya Odonnell is a 57 y.o. female with a hx of breat cancer s/p mastectomy, HTN, hyperlipidemia, hypothyroidism, fibromyalgia who is being seen 12/11/2022 for the evaluation of atypical chest pain at the request of Dr. Arville Care.  History of Present Illness:   Tonya Odonnell just recently followed with her PCP on 12/10/2022.  She had been attending physical therapy for her back and was reported that it is exhausting and causing her to become short of breath and she has pain in her throat, reports inflammation is going down to the neck, states the more she moves more she hurts.  Feels the fluids and inflammation become worse with physical therapy.  When she is pushing up on the wall and swimming moves with PT she reports chest pain.  Reports that physical therapy was not offering any benefit.  She stated shortness of breath and pain all of the time for weeks and started with the weather change.    Presented to the [facility] emergency department the evening of 12/10/2022 with complaints of chest pain x 2 weeks.  Stated that she saw her PCP earlier today and had labs and EKG completed.  EKG was abnormal from previous studies and the patient was advised to come to the emergency department for further evaluation.  She was also reported associated shortness of breath and anxiety as well.  She noted pain and numbness radiating into her left arm.  She states that she has been having muscle pains and bone pain that she was told may be a complication of cancer treatment or vaccination.  Her PCP has been working on several medication changes as far as steroids and fluid pills to try to remove some of the swelling and inflammation to help with her  symptoms.  With associated shortness of breath she states that is progressively worsened and she now feels extreme short of breath as well as a dull aching sensation.  She denies any history of coronary artery disease.  Initial vital signs: Blood pressure 163/111, pulse 103, respirations of 18, temperature 98.2  Pertinent labs: Potassium 3.3, blood glucose 131, hemoglobin 18, BNP 275.2, total bilirubin 2.1, direct bilirubin 0.3, indirect bilirubin 1.8, sensitivity troponin negative x 4, total cholesterol 226 with an LDL 145  Imaging: Chest x-ray revealed low lung volumes with bibasilar atelectasis; CTA of the chest revealed no evidence of pulmonary emboli and no acute abnormality seen  Medications administered in the emergency department: Milk of magnesia, aspirin 324 mg, furosemide 20 mg IVP, potassium chloride 40 mEq, 1 inch of Nitropaste, and an additional 40 mill equivalent packet of potassium chloride  Cardiology was consulted for concerns of atypical chest pain.   Past Medical History:  Diagnosis Date   Anemia    "USE TO HAVE IT,NOT NOW,2015,2016   Arthritis    HIPS,KNEES,HANDS   Breast cancer in female (HCC)    Cancer (HCC)    LEFT   Hyperlipidemia    Hypertension    Low iron    Neuromuscular disorder (HCC)    MUSCLE PAIN,FIBROMYALGIA   Osteopenia    PVC (premature ventricular contraction)    Thyroid disease     Past Surgical History:  Procedure Laterality Date  Left Ovarian removal Left    MASTECTOMY Left    "LYMPHNODES REMOVED     Home Medications:  Prior to Admission medications   Medication Sig Start Date End Date Taking? Authorizing Provider  ascorbic acid (VITAMIN C) 500 MG tablet Take 500 mg by mouth daily.    [provider]  Azelaic Acid 15 % gel Apply to face every morning 08/26/22   Willeen Niece, MD  cetirizine-pseudoephedrine (ZYRTEC-D) 5-120 MG tablet Take 1 tablet by mouth 2 (two) times daily. 07/08/22   Aura Dials T, NP  Cholecalciferol  25 MCG (1000 UT) tablet Take 1 tablet (1,000 Units total) by mouth daily. 11/22/21   Cannady, Corrie Dandy T, NP  clobetasol cream (TEMOVATE) 0.05 % Apply 1 application topically 2 (two) times daily. Apply to affected area 01/09/21   Federico Flake, MD  cromolyn (OPTICROM) 4 % ophthalmic solution Place 1 drop into both eyes 4 (four) times daily. 12/10/22   Cannady, Corrie Dandy T, NP  cyanocobalamin (VITAMIN B12) 1000 MCG tablet Take 1,000 mcg by mouth daily.    [provider]  diclofenac Sodium (VOLTAREN) 1 % GEL Apply topically as needed.    [provider]  diltiazem 2 % GEL Apply 1 Application topically 3 (three) times daily. Inside the rectum as directed. 08/21/22   Meredith Pel, NP  ELIDEL 1 % cream Apply to red scaly areas on the face BID for seb derm 04/09/22   Willeen Niece, MD  fluticasone [facility]) 50 MCG/ACT nasal spray Place 2 sprays into both nostrils daily. Patient taking differently: Place 2 sprays into both nostrils as needed. 12/13/20 12/13/21  Vigg, Roma Schanz, MD  hydrALAZINE (APRESOLINE) 25 MG tablet Take 1 tablet (25 mg total) by mouth 2 (two) times daily. 12/10/22   Cannady, Corrie Dandy T, NP  hydrochlorothiazide (HYDRODIURIL) 12.5 MG tablet Take 12.5 mg by mouth daily.    [provider]  ibuprofen (ADVIL) 200 MG tablet Take 1 tablet (200 mg total) by mouth every 6 (six) hours as needed. 09/01/22   Cannady, Corrie Dandy T, NP  Ivermectin (SOOLANTRA) 1 % CREA Apply 1 Application topically at bedtime. 09/04/21   Willeen Niece, MD  levothyroxine (SYNTHROID) 112 MCG tablet Take 1 tablet (112 mcg total) by mouth daily. 10/31/22   Cannady, Corrie Dandy T, NP  metoprolol succinate (TOPROL-XL) 50 MG 24 hr tablet Take 1 tablet (50 mg total) by mouth in the morning and at bedtime. 11/25/21   Cannady, Corrie Dandy T, NP  minoxidil (LONITEN) 2.5 MG tablet Take 1 tablet (2.5 mg total) by mouth daily. 02/25/22   Willeen Niece, MD  omeprazole (PRILOSEC) 40 MG capsule Take 1 capsule (40 mg total) by  mouth daily. 03/21/22 03/22/23  Cannady, Corrie Dandy T, NP  valACYclovir (VALTREX) 1000 MG tablet Take 1,000 mg by mouth daily. 08/12/22   [provider]    Inpatient Medications: Scheduled Meds:  ascorbic acid  500 mg Oral Daily   cholecalciferol  1,000 Units Oral Daily   cyanocobalamin  1,000 mcg Oral Daily   enoxaparin (LOVENOX) injection  40 mg Subcutaneous Q24H   hydrALAZINE  25 mg Oral BID   hydrochlorothiazide  12.5 mg Oral Daily   levothyroxine  112 mcg Oral Q0600   metoprolol succinate  50 mg Oral BID   minoxidil  2.5 mg Oral Daily   pantoprazole  40 mg Oral Daily   Continuous Infusions:  0.9 % NaCl with KCl 20 mEq / L 100 mL/hr at 12/10/22 2333   PRN Meds: acetaminophen, ALPRAZolam,  fluticasone, magnesium hydroxide, ondansetron (ZOFRAN) IV  Allergies:    Allergies  Allergen Reactions   Lisinopril Other (See Comments)    cough cough     Social History:   Social History   Socioeconomic History   Marital status: Married    Spouse name: Not on file   Number of children: Not on file   Years of education: Not on file   Highest education level: Not on file  Occupational History   Occupation: disabled  Tobacco Use   Smoking status: Never   Smokeless tobacco: Never  Vaping Use   Vaping status: Never Used  Substance and Sexual Activity   Alcohol use: Never   Drug use: Never   Sexual activity: Not Currently  Other Topics Concern   Not on file  Social History Narrative   Not on file   Social Determinants of Health   Financial Resource Strain: High Risk (05/13/2021)   Received from [facility], [facility]   Overall Financial Resource Strain (CARDIA)    Difficulty of Paying Living Expenses: Very hard  Food Insecurity: Food Insecurity Present (05/13/2021)   Received from [facility], [facility]   Hunger Vital Sign    Worried About Running Out of Food in the Last Year: Often true    Ran Out of Food in the Last Year: Patient declined  Transportation  Needs: Unmet Transportation Needs (05/13/2021)   Received from [facility], [facility]   PRAPARE - Transportation    Lack of Transportation (Medical): Yes    Lack of Transportation (Non-Medical): Yes  Physical Activity: Inactive (05/13/2021)   Received from [facility], [facility]   Exercise Vital Sign    Days of Exercise per Week: 0 days    Minutes of Exercise per Session: 0 min  Stress: Stress Concern Present (05/13/2021)   Received from [facility], [facility]   [employer] of Occupational Health - Occupational Stress Questionnaire    Feeling of Stress : Very much  Social Connections: Unknown (07/13/2021)   Received from [facility], [facility]   Social Network    Social Network: Not on file  Intimate Partner Violence: Unknown (06/14/2021)   Received from [facility], [facility]   HITS    Physically Hurt: Not on file    Insult or Talk Down To: Not on file    Threaten Physical Harm: Not on file    Scream or Curse: Not on file    Family History:    Family History  Problem Relation Age of Onset   Hypertension Mother    Leukemia Mother    Stomach cancer Father    Colon cancer Neg Hx    Colon polyps Neg Hx    Crohn's disease Neg Hx    Esophageal cancer Neg Hx    Rectal cancer Neg Hx      ROS:  Please see the history of present illness.  Review of Systems  Constitutional:  Positive for malaise/fatigue.  Cardiovascular:  Positive for chest pain and leg swelling.  Musculoskeletal:  Positive for joint pain and neck pain.  Neurological:  Positive for weakness.    All other ROS reviewed and negative.     Physical Exam/Data:   Vitals:   12/11/22 0300 12/11/22 0334 12/11/22 0400 12/11/22 0518  BP: 119/88  108/76 114/75  Pulse: 87 85 78 79  Resp: (!) 22 (!) 22 (!) 21 17  Temp:  97.9 F (36.6 C)    TempSrc:  SpO2: 92% 91% 94% 95%  Weight:      Height:        Intake/Output Summary (Last 24 hours) at 12/11/2022 0737 Last data filed at  12/11/2022 0330 Gross per 24 hour  Intake --  Output 1400 ml  Net -1400 ml      12/10/2022   11:38 PM 12/10/2022    6:32 PM 12/10/2022    1:53 PM  Last 3 Weights  Weight (lbs) 186 lb 3.2 oz 186 lb 3.2 oz 186 lb 3.2 oz  Weight (kg) 84.46 kg 84.46 kg 84.46 kg     Body mass index is 32.98 kg/m.  General:  Well nourished, well developed, in no acute distress HEENT: normal Neck: unable to assess JVD due to positioning and body habitus Vascular: No carotid bruits; Distal pulses 2+ bilaterally Cardiac:  normal S1, S2; RRR; no murmur  Lungs:  clear to auscultation bilaterally, no wheezing, rhonchi or rales, respirations are unlabored at rest on room air  Abd: soft, nontender, obese, no hepatomegaly  Ext: 1+ edema BLE Musculoskeletal:  No deformities, BUE and BLE strength normal and equal Skin: warm and dry  Neuro:  CNs 2-12 intact, no focal abnormalities noted Psych:  Normal affect   EKG:  The EKG was personally reviewed and demonstrates:  Sinus rate of 70-80 with ST depression and TWI in anterolateral leads Telemetry:  Telemetry was personally reviewed and demonstrates:  sinus rte 70's with ST depression and TWI  Relevant CV Studies: Updated Echo ordered and pending  TTE 02/13/2015 (Outside facility) Summary  Technically difficult study secondary to body habitus with poor acoustic  window. Unable to assess wall motion abnormality due to poor endocardial  definition. Left ventricle appears to be mild hypertrophy with normal left  ventricular function. EF visually estimated 65%.  Valvular structures were not well visualized; however, no regurgitation  noted on the color flow study.   Treadmill Stress Test 02/24/2014 (outside facility) Results:  The patient exercised for 7 min 04 seconds achieving 8.5 METS.  The patient was Able to reach age/gender matched expected  exercise time of 7 minutes.  The baseline HR was 98, which increased to 162 (94% MXPHR for  age).  The blood  pressure at peak exercise was 141/90 mmHg. This is a  not hypertensive response to exercise.  There was no chest pain.  Baseline ECG showed NSR. There was no change in heart rhythm with  the following exception: None.  The following ST changes were present: None.  The calculated Duke treadmill score is 6.3.  A score greater than  or equal to 5 indicates low risk.  Conclusion:  Exercise treadmill ECG negative for ischemia.   TTE 02/16/2014 (outside facility) Conclusions:  1.  Normal LV size, wall thickness and systolic function with  LVEF60-65%  2. Normal LA and Right Heart chamber sizes  3.  Valves anatomically unremarkable without regurgitation noted  4.  IVC not visualized.   Laboratory Data:  High Sensitivity Troponin:   Recent Labs  Lab 12/10/22 1838 12/10/22 2021 12/10/22 2315 12/11/22 0245  TROPONINIHS 9 11 14 8      Chemistry Recent Labs  Lab 12/10/22 1838 12/11/22 0245  NA 140  --   K 3.3*  --   CL 105  --   CO2 22  --   GLUCOSE 131*  --   BUN 20  --   CREATININE 0.75  --   CALCIUM 9.7  --   MG  --  2.0  GFRNONAA >60  --   ANIONGAP 13  --     Recent Labs  Lab 12/10/22 2021  PROT 6.8  ALBUMIN 4.0  AST 20  ALT 25  ALKPHOS 58  BILITOT 2.1*   Lipids  Recent Labs  Lab 12/11/22 0245  CHOL 226*  TRIG 78  HDL 65  LDLCALC 145*  CHOLHDL 3.5    Hematology Recent Labs  Lab 12/10/22 1838  WBC 10.0  RBC 6.24*  HGB 18.0*  HCT 53.7*  MCV 86.1  MCH 28.8  MCHC 33.5  RDW 13.2  PLT 340   Thyroid No results for input(s): "TSH", "FREET4" in the last 168 hours.  BNP Recent Labs  Lab 12/10/22 2021  BNP 275.2*    DDimer No results for input(s): "DDIMER" in the last 168 hours.   Radiology/Studies:  CT Angio Chest PE W and/or Wo Contrast  Result Date: 12/10/2022 CLINICAL DATA:  Chest pain for 2 weeks, initial encounter EXAM: CT ANGIOGRAPHY CHEST WITH CONTRAST TECHNIQUE: Multidetector CT imaging of the chest was performed using the standard  protocol during bolus administration of intravenous contrast. Multiplanar CT image reconstructions and MIPs were obtained to evaluate the vascular anatomy. RADIATION DOSE REDUCTION: This exam was performed according to the departmental dose-optimization program which includes automated exposure control, adjustment of the mA and/or kV according to patient size and/or use of iterative reconstruction technique. CONTRAST:  75mL OMNIPAQUE IOHEXOL 350 MG/ML SOLN COMPARISON:  Chest x-ray from earlier in the same day. FINDINGS: Cardiovascular: Pulmonary artery shows a normal branching pattern. No filling defect to suggest pulmonary embolism is identified. The ascending aorta is incompletely evaluated due to the timing of the contrast bolus. No aneurysmal dilatation is seen. Some adjacent fluid attenuation is noted along the ascending aorta. This is felt to represent the superior pericardial reflection. No hyperdense crescent to suggest acute aortic injury is noted. Heart is not significantly enlarged in size. Mediastinum/Nodes: Thoracic inlet is within normal limits. No hilar or mediastinal adenopathy is noted. The esophagus as visualized is within normal limits. Lungs/Pleura: Lungs are well aerated bilaterally. No focal infiltrate or effusion is seen. No parenchymal nodules are noted. Upper Abdomen: Visualized upper abdomen shows no acute abnormality. Musculoskeletal: Left breast implant is seen. No acute bony abnormality is noted. Review of the MIP images confirms the above findings. IMPRESSION: No evidence of pulmonary emboli. No acute abnormality seen. Electronically Signed   By: Alcide Clever M.D.   On: 12/10/2022 21:52   DG Chest 2 View  Result Date: 12/10/2022 CLINICAL DATA:  Chest pain and shortness of breath EXAM: CHEST - 2 VIEW COMPARISON:  None Available. FINDINGS: Low lung volumes accentuate mediastinal silhouette and pulmonary vascularity. bibasilar atelectasis. No pleural effusion or pneumothorax. No  displaced rib fractures. IMPRESSION: Low lung volumes with bibasilar atelectasis. Electronically Signed   By: Minerva Fester M.D.   On: 12/10/2022 19:41     Assessment and Plan:   Atypical chest pain -EKG significant for ST depression and T wave inversions which is changed from prior studies concerning when previously noted at PCP office -Patient also been undergoing physical therapy for various weakness and muscle aches and pains which she stated was ineffective and was causing her chest pain with increased movement -High-sensitivity troponins are trended and negative x 4 -Echocardiogram ordered and pending with further recommendations to follow -If echocardiogram results as normal can consider outpatient coronary CTA -No current indication for heparin infusion -Continue with telemetry monitoring -EKG as needed for pain or changes  Shortness of breath -Progressive shortness of breath over the last 2 weeks -BNP 275.2 -Received 20 mg of furosemide IVP while in the emergency department -Maintaining oxygen saturations on room air -Chest x-ray revealing low lung volumes but chest CT negative for PE or any acute abnormalities -Echocardiogram ordered and pending with further recommendations to follow  Hypokalemia -Serum potassium 3.3 on arrival -Potassium supplementation given -Repeat serum potassium 4.1 -IV fluids discontinued -Daily BMP -Monitor/trend/replete electrolytes as needed  Hypertensin -Blood pressure 120/82 -Continued on hydralazine 25 mg twice daily, HCTZ 12.5 mg daily, and Toprol-XL 50 mg twice daily -Vital signs per unit protocol  Hyperlipidemia --LDL 145 with 10-year risk of CVD 4.7%, ASCVD recommends to emphasize lifestyle to reduce risk factors, if outpatient coronary CTA reveals high calcium score will discuss initiation of statin therapy if tolerable -High concern for statin intolerance due to continued muscle aches and bone pain  Hypothyroidism -Continued on  levothyroxine -Management per IM  History of breast cancer  -Status post left mastectomy -Previously underwent adjuvant chemotherapy -In 2017 was on antiestrogen oral therapy -She continues to be followed by oncology   Risk Assessment/Risk Scores:        New York Heart Association (NYHA) Functional Class NYHA Class II        For questions or updates, please contact [facility] Please consult [email or web address] for contact info under    Signed, Clista Rainford, NP  12/11/2022 7:37 AM

## 2022-12-11 NOTE — Assessment & Plan Note (Signed)
repleted   Serial BMP 

## 2022-12-11 NOTE — Assessment & Plan Note (Signed)
-   She has elevated BN P without other significant symptoms of CHF. - Will obtain a 2D echo. - Cardiology consult will be obtained as mentioned above. - She was given a dose of 20 mg of IV Lasix we will continue as needed

## 2022-12-11 NOTE — Assessment & Plan Note (Signed)
-   We will resume Synthroid.

## 2022-12-11 NOTE — Assessment & Plan Note (Signed)
-   This could be contributing to new onset CHF. - We will continue her antihypertensive therapy.

## 2022-12-11 NOTE — Progress Notes (Signed)
Contacted via MyChart   Good afternoon Tonya Odonnell, your labs have returned, I know you are in hospital and they have performed these and even more labs.  As noted your hemoglobin and hematocrit has been elevated on checks for a few months, I know in hospital they are going to further check on this.  Your BNP was done in hospital, I have no results here yet.  A1c remains in prediabetic range, no change from April.  Any questions? Keep being wonderful!!  Thank you for allowing me to participate in your care.  I appreciate you. Kindest regards, Bronc Brosseau

## 2022-12-11 NOTE — Assessment & Plan Note (Signed)
-   We will continue PPI therapy 

## 2022-12-12 ENCOUNTER — Observation Stay: Admit: 2022-12-12 | Payer: Medicaid Other

## 2022-12-12 ENCOUNTER — Ambulatory Visit: Payer: Self-pay | Admitting: Physical Therapy

## 2022-12-12 ENCOUNTER — Encounter
Admission: EM | Disposition: A | Discharge status: Home / Self Care | Payer: Self-pay | Source: Home / Self Care | Attending: Emergency Medicine

## 2022-12-12 ENCOUNTER — Observation Stay (HOSPITAL_BASED_OUTPATIENT_CLINIC_OR_DEPARTMENT_OTHER)
Admit: 2022-12-12 | Discharge: 2022-12-12 | Disposition: A | Discharge status: Home / Self Care | Payer: Medicaid Other | Attending: Cardiovascular Disease | Admitting: Cardiovascular Disease

## 2022-12-12 DIAGNOSIS — I272 Pulmonary hypertension, unspecified: Secondary | ICD-10-CM

## 2022-12-12 DIAGNOSIS — R931 Abnormal findings on diagnostic imaging of heart and coronary circulation: Secondary | ICD-10-CM | POA: Diagnosis not present

## 2022-12-12 DIAGNOSIS — I200 Unstable angina: Secondary | ICD-10-CM | POA: Diagnosis not present

## 2022-12-12 DIAGNOSIS — I5031 Acute diastolic (congestive) heart failure: Secondary | ICD-10-CM | POA: Diagnosis not present

## 2022-12-12 DIAGNOSIS — E039 Hypothyroidism, unspecified: Secondary | ICD-10-CM | POA: Diagnosis not present

## 2022-12-12 DIAGNOSIS — E876 Hypokalemia: Secondary | ICD-10-CM | POA: Diagnosis not present

## 2022-12-12 DIAGNOSIS — R0789 Other chest pain: Secondary | ICD-10-CM | POA: Diagnosis not present

## 2022-12-12 DIAGNOSIS — I50810 Right heart failure, unspecified: Secondary | ICD-10-CM | POA: Diagnosis not present

## 2022-12-12 DIAGNOSIS — I160 Hypertensive urgency: Secondary | ICD-10-CM | POA: Diagnosis not present

## 2022-12-12 DIAGNOSIS — R9431 Abnormal electrocardiogram [ECG] [EKG]: Secondary | ICD-10-CM | POA: Diagnosis not present

## 2022-12-12 DIAGNOSIS — R0609 Other forms of dyspnea: Secondary | ICD-10-CM | POA: Diagnosis not present

## 2022-12-12 DIAGNOSIS — E669 Obesity, unspecified: Secondary | ICD-10-CM | POA: Diagnosis not present

## 2022-12-12 HISTORY — PX: RIGHT/LEFT HEART CATH AND CORONARY ANGIOGRAPHY: CATH118266

## 2022-12-12 LAB — POCT I-STAT EG7
Acid-base deficit: 1 mmol/L (ref 0.0–2.0)
Bicarbonate: 24 mmol/L (ref 20.0–28.0)
Calcium, Ion: 1.29 mmol/L (ref 1.15–1.40)
HCT: 52 % — ABNORMAL HIGH (ref 36.0–46.0)
Hemoglobin: 17.7 g/dL — ABNORMAL HIGH (ref 12.0–15.0)
O2 Saturation: 66 %
Potassium: 3.6 mmol/L (ref 3.5–5.1)
Sodium: 139 mmol/L (ref 135–145)
TCO2: 25 mmol/L (ref 22–32)
pCO2, Ven: 40.4 mm[Hg] — ABNORMAL LOW (ref 44–60)
pH, Ven: 7.382 (ref 7.25–7.43)
pO2, Ven: 35 mm[Hg] (ref 32–45)

## 2022-12-12 LAB — POCT I-STAT 7, (LYTES, BLD GAS, ICA,H+H)
Acid-base deficit: 2 mmol/L (ref 0.0–2.0)
Bicarbonate: 21.8 mmol/L (ref 20.0–28.0)
Calcium, Ion: 1.24 mmol/L (ref 1.15–1.40)
HCT: 51 % — ABNORMAL HIGH (ref 36.0–46.0)
Hemoglobin: 17.3 g/dL — ABNORMAL HIGH (ref 12.0–15.0)
O2 Saturation: 94 %
Potassium: 3.4 mmol/L — ABNORMAL LOW (ref 3.5–5.1)
Sodium: 138 mmol/L (ref 135–145)
TCO2: 23 mmol/L (ref 22–32)
pCO2 arterial: 35.2 mm[Hg] (ref 32–48)
pH, Arterial: 7.399 (ref 7.35–7.45)
pO2, Arterial: 69 mm[Hg] — ABNORMAL LOW (ref 83–108)

## 2022-12-12 LAB — CBC WITH DIFFERENTIAL/PLATELET
Basophils Absolute: 0.1 10*3/uL (ref 0.0–0.2)
Basos: 1 %
EOS (ABSOLUTE): 0.1 10*3/uL (ref 0.0–0.4)
Eos: 1 %
Hematocrit: 55.6 % — ABNORMAL HIGH (ref 34.0–46.6)
Hemoglobin: 17.3 g/dL — ABNORMAL HIGH (ref 11.1–15.9)
Immature Grans (Abs): 0 10*3/uL (ref 0.0–0.1)
Immature Granulocytes: 1 %
Lymphocytes Absolute: 2.6 10*3/uL (ref 0.7–3.1)
Lymphs: 32 %
MCH: 28.1 pg (ref 26.6–33.0)
MCHC: 31.1 g/dL — ABNORMAL LOW (ref 31.5–35.7)
MCV: 90 fL (ref 79–97)
Monocytes Absolute: 0.6 10*3/uL (ref 0.1–0.9)
Monocytes: 8 %
Neutrophils Absolute: 4.7 10*3/uL (ref 1.4–7.0)
Neutrophils: 57 %
Platelets: 305 10*3/uL (ref 150–450)
RBC: 6.15 x10E6/uL — ABNORMAL HIGH (ref 3.77–5.28)
RDW: 13.5 % (ref 11.7–15.4)
WBC: 8.1 10*3/uL (ref 3.4–10.8)

## 2022-12-12 LAB — ECHOCARDIOGRAM COMPLETE
Height: 63 in
S' Lateral: 2.6 cm
Weight: 2979.21 [oz_av]

## 2022-12-12 LAB — BRAIN NATRIURETIC PEPTIDE: BNP: 164.8 pg/mL — ABNORMAL HIGH (ref 0.0–100.0)

## 2022-12-12 LAB — SEDIMENTATION RATE: Sed Rate: 2 mm/h (ref 0–30)

## 2022-12-12 LAB — HEMOGLOBIN A1C
Est. average glucose Bld gHb Est-mCnc: 117 mg/dL
Hgb A1c MFr Bld: 5.7 % — ABNORMAL HIGH (ref 4.8–5.6)

## 2022-12-12 SURGERY — RIGHT/LEFT HEART CATH AND CORONARY ANGIOGRAPHY
Anesthesia: Moderate Sedation

## 2022-12-12 MED ORDER — HEPARIN SODIUM (PORCINE) 1000 UNIT/ML IJ SOLN
INTRAMUSCULAR | Status: AC
  Filled 2022-12-12: qty 10

## 2022-12-12 MED ORDER — TADALAFIL 20 MG PO TABS
20.0000 mg | ORAL_TABLET | Freq: Every day | ORAL | Status: DC
  Administered 2022-12-12 – 2022-12-13 (×2): 20 mg via ORAL
  Filled 2022-12-12 (×2): qty 1

## 2022-12-12 MED ORDER — LIDOCAINE HCL (PF) 1 % IJ SOLN
INTRAMUSCULAR | Status: DC | PRN
  Administered 2022-12-12: 2 mL

## 2022-12-12 MED ORDER — MIDAZOLAM HCL 2 MG/2ML IJ SOLN
INTRAMUSCULAR | Status: AC
  Filled 2022-12-12: qty 2

## 2022-12-12 MED ORDER — ASPIRIN 81 MG PO CHEW: 81.0000 mg | CHEWABLE_TABLET | ORAL | Status: DC

## 2022-12-12 MED ORDER — FENTANYL CITRATE (PF) 100 MCG/2ML IJ SOLN
INTRAMUSCULAR | Status: DC | PRN
  Administered 2022-12-12: 25 ug via INTRAVENOUS

## 2022-12-12 MED ORDER — BUTALBITAL-APAP-CAFFEINE 50-325-40 MG PO TABS
1.0000 | ORAL_TABLET | Freq: Four times a day (QID) | ORAL | Status: DC | PRN
  Administered 2022-12-12 – 2022-12-13 (×2): 1 via ORAL
  Filled 2022-12-12 (×2): qty 1

## 2022-12-12 MED ORDER — FENTANYL CITRATE (PF) 100 MCG/2ML IJ SOLN
INTRAMUSCULAR | Status: AC
  Filled 2022-12-12: qty 2

## 2022-12-12 MED ORDER — SODIUM CHLORIDE 0.9% FLUSH
3.0000 mL | Freq: Two times a day (BID) | INTRAVENOUS | Status: DC
  Administered 2022-12-12 – 2022-12-13 (×2): 3 mL via INTRAVENOUS

## 2022-12-12 MED ORDER — HEPARIN (PORCINE) IN NACL 2000-0.9 UNIT/L-% IV SOLN
INTRAVENOUS | Status: DC | PRN
  Administered 2022-12-12: 1000 mL

## 2022-12-12 MED ORDER — ATORVASTATIN CALCIUM 20 MG PO TABS: 40.0000 mg | ORAL_TABLET | Freq: Every day | ORAL | Status: DC

## 2022-12-12 MED ORDER — SODIUM CHLORIDE 0.9 % IV SOLN: INTRAVENOUS | Status: AC

## 2022-12-12 MED ORDER — HEPARIN (PORCINE) IN NACL 1000-0.9 UT/500ML-% IV SOLN
INTRAVENOUS | Status: AC
  Filled 2022-12-12: qty 1000

## 2022-12-12 MED ORDER — MIDAZOLAM HCL 2 MG/2ML IJ SOLN
INTRAMUSCULAR | Status: DC | PRN
  Administered 2022-12-12 (×2): 1 mg via INTRAVENOUS

## 2022-12-12 MED ORDER — SODIUM CHLORIDE 0.9 % IV SOLN: INTRAVENOUS | Status: DC

## 2022-12-12 MED ORDER — SODIUM CHLORIDE 0.9 % IV SOLN: 250.0000 mL | INTRAVENOUS | Status: DC | PRN

## 2022-12-12 MED ORDER — FUROSEMIDE 20 MG PO TABS
20.0000 mg | ORAL_TABLET | Freq: Every day | ORAL | Status: DC
  Administered 2022-12-13: 20 mg via ORAL
  Filled 2022-12-12: qty 1

## 2022-12-12 MED ORDER — VERAPAMIL HCL 2.5 MG/ML IV SOLN
INTRAVENOUS | Status: AC
  Filled 2022-12-12: qty 2

## 2022-12-12 MED ORDER — VERAPAMIL HCL 2.5 MG/ML IV SOLN
INTRAVENOUS | Status: DC | PRN
  Administered 2022-12-12: 2.5 mg via INTRA_ARTERIAL

## 2022-12-12 MED ORDER — SODIUM CHLORIDE 0.9% FLUSH: 3.0000 mL | INTRAVENOUS | Status: DC | PRN

## 2022-12-12 MED ORDER — HEPARIN SODIUM (PORCINE) 1000 UNIT/ML IJ SOLN
INTRAMUSCULAR | Status: DC | PRN
  Administered 2022-12-12: 4000 [IU] via INTRAVENOUS

## 2022-12-12 MED ORDER — IOHEXOL 300 MG/ML  SOLN
INTRAMUSCULAR | Status: DC | PRN
  Administered 2022-12-12: 25 mL

## 2022-12-12 MED ORDER — ASPIRIN 81 MG PO TBEC
81.0000 mg | DELAYED_RELEASE_TABLET | Freq: Every day | ORAL | Status: DC
  Administered 2022-12-12 – 2022-12-13 (×2): 81 mg via ORAL
  Filled 2022-12-12 (×2): qty 1

## 2022-12-12 MED ORDER — LIDOCAINE HCL 1 % IJ SOLN
INTRAMUSCULAR | Status: AC
  Filled 2022-12-12: qty 20

## 2022-12-12 SURGICAL SUPPLY — 13 items
CATH BALLN WEDGE 5F 110CM (CATHETERS) IMPLANT
CATH INFINITI AMBI 5FR JK (CATHETERS) IMPLANT
CATH INFINITI JR4 5F (CATHETERS) IMPLANT
DEVICE RAD TR BAND REGULAR (VASCULAR PRODUCTS) IMPLANT
DRAPE BRACHIAL (DRAPES) IMPLANT
GLIDESHEATH SLEND SS 6F .021 (SHEATH) IMPLANT
GUIDEWIRE INQWIRE 1.5J.035X260 (WIRE) IMPLANT
INQWIRE 1.5J .035X260CM (WIRE) ×1
PACK CARDIAC CATH (CUSTOM PROCEDURE TRAY) ×1 IMPLANT
PROTECTION STATION PRESSURIZED (MISCELLANEOUS) ×1
SET ATX-X65L (MISCELLANEOUS) IMPLANT
SHEATH GLIDE SLENDER 4/5FR (SHEATH) IMPLANT
STATION PROTECTION PRESSURIZED (MISCELLANEOUS) IMPLANT

## 2022-12-12 NOTE — Progress Notes (Signed)
Progress Note   Patient: Tonya Odonnell GMW:[id number] DOB: 11-26-1965 DOA: 12/10/2022     0 DOS: the patient was seen and examined on 12/12/2022   Brief hospital course: Donne Wade is a 57 y.o. Caucasian female with medical history significant for breast cancer s/p mastectomy, hypertension, dyslipidemia and hypothyroidism, who presented to emergency room with acute onset of dyspnea on exertion as well as intermittent chest pain felt as pressure over the last couple of weeks worsening with exertion as well as generalized weakness.  She was recently given diuretic therapy for lower extremity edema.  He describes the chest pain location: Dull aching substernal chest pressure.  She admits to dry cough without wheezing or hemoptysis.  Admits pain after she had exercise, pain more with moving her extremities, also has chest wall tenderness. She is admitted for chest pain, sob with exertion for cardiac evaluation.  Assessment and Plan: * Chest pain/ dyspnea on exertion Still has chest pain on and off. BNP high. EKG with T wave inversions Echo concerning for pulmo hypertension Cardiology follow up appreciated recommended right and left heart cath which is scheduled for today. Continue aspirin. Telemetry monitoring.  Hypokalemia Improved after replaced.  Hypertensive urgency BP improved. Her home regimen, metoprolol, minoxidil, hydrochlorothiazide, hydralazine resumed.  Migraine headache- fioricet prn ordered.  GERD without esophagitis Continue PPI therapy.  Hypothyroidism Resumed Synthroid.  Obesity with BMI 32.9 Diet, exercise and weight reduction advised. Encouraged life style modification.       Out of bed to chair. Incentive spirometry. Nursing supportive care. Fall, aspiration precautions. DVT prophylaxis   Code Status: Full Code  Subjective: Patient is seen and examined today morning. She complains of headache, on and off chest pain. BP improved.   Physical  Exam: Vitals:   12/12/22 1630 12/12/22 1645 12/12/22 1700 12/12/22 1715  BP: (!) 162/115 (!) 165/113 (!) 161/112 (!) 144/106  Pulse: 82 82 77 81  Resp: (!) 22 (!) 21 (!) 23 18  Temp:      TempSrc:      SpO2: 92% 92% 94% 92%  Weight:      Height:        General -  Middle aged obese female, distress due to headache HEENT - PERRLA, EOMI, atraumatic head, non tender sinuses. Lung - Clear, distant breath sounds, no rales, rhonchi, wheezes. Heart - S1, S2 heard, no murmurs, rubs, trace pedal edema. Abdomen - Soft, non tender obese, bowel sounds good Neuro - Alert, awake and oriented x 3, non focal exam. Skin - Warm and dry.  Data Reviewed:      Latest Ref Rng & Units 12/12/2022    3:34 PM 12/12/2022    3:30 PM 12/10/2022    6:38 PM  CBC  WBC 4.0 - 10.5 K/uL   10.0   Hemoglobin 12.0 - 15.0 g/dL 44.0  34.7  42.5   Hematocrit 36.0 - 46.0 % 51.0  52.0  53.7   Platelets 150 - 400 K/uL   340       Latest Ref Rng & Units 12/12/2022    3:34 PM 12/12/2022    3:30 PM 12/11/2022   10:26 AM  BMP  Glucose 70 - 99 mg/dL   956   BUN 6 - 20 mg/dL   16   Creatinine 3.87 - 1.00 mg/dL   5.64   Sodium 332 - 951 mmol/L 138  139  136   Potassium 3.5 - 5.1 mmol/L 3.4  3.6  4.1   Chloride 98 - 111  mmol/L   106   CO2 22 - 32 mmol/L   18   Calcium 8.9 - 10.3 mg/dL   8.9    CARDIAC CATHETERIZATION  Result Date: 12/12/2022 1.  Normal coronary arteries. 2.  Left ventricular angiography was not performed.  EF was low normal by echo.  Normal left ventricular end-diastolic pressure. 3.  Right heart catheterization showed mildly elevated RA pressure, severe pulmonary hypertension, normal wedge pressure and normal cardiac output. RA: 11 mmHg RV: 78/6 mmHg PW: 10 mmHg PA: 76/38 with a mean of 52 mmHg.  Pulmonary vascular resistance is 7.3 Woods units Cardiac output: 5.7 with an index of 3.04. Recommendations: The patient has evidence of severe pulmonary arterial hypertension.  There is evidence of RV  dysfunction on echocardiogram.  I consulted advanced heart failure to help with evaluation and management.   ECHOCARDIOGRAM COMPLETE  Result Date: 12/12/2022    ECHOCARDIOGRAM REPORT   Patient Name:   Tonya Odonnell Date of Exam: 12/12/2022 Medical Rec #:  [id number]    Height:       63.0 in Accession #:    [id number]   Weight:       186.2 lb Date of Birth:  1965/12/09    BSA:          1.876 m Patient Age:    56 years     BP:           113/84 mmHg Patient Gender: F            HR:           76 bpm. Exam Location:  [facility] Procedure: 2D Echo, Cardiac Doppler and Color Doppler Indications:     CHF-acute diastolic I50.31  History:         Patient has no prior history of Echocardiogram examinations.                  Risk Factors:Hypertension and Dyslipidemia.  Sonographer:     Cristela Blue Referring Phys:  [id number] Antonieta Iba Diagnosing Phys: Debbe Odea MD  Sonographer Comments: Image acquisition challenging due to breast implants. IMPRESSIONS  1. Left ventricular ejection fraction, by estimation, is 50 to 55%. The left ventricle has low normal function. The left ventricle has no regional wall motion abnormalities. There is mild left ventricular hypertrophy. Left ventricular diastolic parameters are indeterminate.  2. Right ventricular systolic function is low normal. The right ventricular size is not well visualized.  3. The mitral valve is normal in structure. No evidence of mitral valve regurgitation.  4. The aortic valve is tricuspid. Aortic valve regurgitation is not visualized. FINDINGS  Left Ventricle: Left ventricular ejection fraction, by estimation, is 50 to 55%. The left ventricle has low normal function. The left ventricle has no regional wall motion abnormalities. The left ventricular internal cavity size was normal in size. There is mild left ventricular hypertrophy. Left ventricular diastolic parameters are indeterminate. Right Ventricle: The right ventricular size is not well visualized. No increase  in right ventricular wall thickness. Right ventricular systolic function is low normal. Left Atrium: Left atrial size was normal in size. Right Atrium: Right atrial size was not well visualized. Pericardium: There is no evidence of pericardial effusion. Mitral Valve: The mitral valve is normal in structure. No evidence of mitral valve regurgitation. Tricuspid Valve: The tricuspid valve is normal in structure. Tricuspid valve regurgitation is not demonstrated. Aortic Valve: The aortic valve is tricuspid. Aortic valve regurgitation is not visualized. Pulmonic Valve: The pulmonic valve  was normal in structure. Pulmonic valve regurgitation is not visualized. Aorta: The aortic root is normal in size and structure. Venous: The inferior vena cava was not well visualized. IAS/Shunts: The interatrial septum was not well visualized.  LEFT VENTRICLE PLAX 2D LVIDd:         3.30 cm LVIDs:         2.60 cm LV PW:         1.30 cm LV IVS:        1.50 cm LVOT diam:     2.00 cm LVOT Area:     3.14 cm  LEFT ATRIUM         Index LA diam:    2.20 cm 1.17 cm/m   AORTA Ao Root diam: 3.17 cm  SHUNTS Systemic Diam: 2.00 cm Debbe Odea MD Electronically signed by Debbe Odea MD Signature Date/Time: 12/12/2022/12:17:49 PM    Final    CT Angio Chest PE W and/or Wo Contrast  Result Date: 12/10/2022 CLINICAL DATA:  Chest pain for 2 weeks, initial encounter EXAM: CT ANGIOGRAPHY CHEST WITH CONTRAST TECHNIQUE: Multidetector CT imaging of the chest was performed using the standard protocol during bolus administration of intravenous contrast. Multiplanar CT image reconstructions and MIPs were obtained to evaluate the vascular anatomy. RADIATION DOSE REDUCTION: This exam was performed according to the departmental dose-optimization program which includes automated exposure control, adjustment of the mA and/or kV according to patient size and/or use of iterative reconstruction technique. CONTRAST:  75mL OMNIPAQUE IOHEXOL 350 MG/ML SOLN  COMPARISON:  Chest x-ray from earlier in the same day. FINDINGS: Cardiovascular: Pulmonary artery shows a normal branching pattern. No filling defect to suggest pulmonary embolism is identified. The ascending aorta is incompletely evaluated due to the timing of the contrast bolus. No aneurysmal dilatation is seen. Some adjacent fluid attenuation is noted along the ascending aorta. This is felt to represent the superior pericardial reflection. No hyperdense crescent to suggest acute aortic injury is noted. Heart is not significantly enlarged in size. Mediastinum/Nodes: Thoracic inlet is within normal limits. No hilar or mediastinal adenopathy is noted. The esophagus as visualized is within normal limits. Lungs/Pleura: Lungs are well aerated bilaterally. No focal infiltrate or effusion is seen. No parenchymal nodules are noted. Upper Abdomen: Visualized upper abdomen shows no acute abnormality. Musculoskeletal: Left breast implant is seen. No acute bony abnormality is noted. Review of the MIP images confirms the above findings. IMPRESSION: No evidence of pulmonary emboli. No acute abnormality seen. Electronically Signed   By: Alcide Clever M.D.   On: 12/10/2022 21:52   DG Chest 2 View  Result Date: 12/10/2022 CLINICAL DATA:  Chest pain and shortness of breath EXAM: CHEST - 2 VIEW COMPARISON:  None Available. FINDINGS: Low lung volumes accentuate mediastinal silhouette and pulmonary vascularity. bibasilar atelectasis. No pleural effusion or pneumothorax. No displaced rib fractures. IMPRESSION: Low lung volumes with bibasilar atelectasis. Electronically Signed   By: Minerva Fester M.D.   On: 12/10/2022 19:41     Family Communication: Discussed with patient, she understands and agrees. All questions answereed.    Disposition: Status is: Observation The patient will require care spanning > 2 midnights and should be moved to inpatient because: heart cath procedure  Planned Discharge Destination:  Home     Time spent: 39 minutes  Author: Marcelino Duster, MD 12/12/2022 5:58 PM Secure chat 7am to 7pm For on call review [email or web address].

## 2022-12-12 NOTE — Progress Notes (Signed)
Iv team consult for 2nd iv site. Left arm restriction due to mastectomy, hard stick.

## 2022-12-12 NOTE — Plan of Care (Signed)
  Problem: Education: Goal: Understanding of cardiac disease, CV risk reduction, and recovery process will improve Outcome: Progressing   Problem: Activity: Goal: Ability to tolerate increased activity will improve Outcome: Progressing   Problem: Cardiac: Goal: Ability to achieve and maintain adequate cardiovascular perfusion will improve Outcome: Progressing   Problem: Education: Goal: Knowledge of General Education information will improve Description: Including pain rating scale, medication(s)/side effects and non-pharmacologic comfort measures Outcome: Progressing   

## 2022-12-12 NOTE — Progress Notes (Signed)
patient complains of ongoing headach.. states tylenol did not resolve.. fioricet d/c'd... requesting tramadol from md.

## 2022-12-12 NOTE — Progress Notes (Signed)
Patient returned from cath lab. Vascular sites level 0. Ambulated to bathroom. Vss. Ordereing dinnner. Denies. pain

## 2022-12-12 NOTE — Progress Notes (Signed)
   Patient Name: Tonya Odonnell Date of Encounter: 12/12/2022 [facility] Cardiologist: None  New consult completed by Dr Mariah Milling  Interval Summary  .    Patient seen on AM rounds. Continues to have some chest pain that she states is not has bad as it was yesterday. Continues to have some exertional shortness of breath. Echocardiogram completed this morning.  Vital Signs .    Vitals:   12/11/22 1931 12/11/22 2103 12/11/22 2307 12/12/22 0327  BP: (!) 133/97 (!) 139/97 113/81 113/84  Pulse: 91 94 94 76  Resp: 18 20 18 18   Temp: 97.8 F (36.6 C)  98.2 F (36.8 C) 98.4 F (36.9 C)  TempSrc:      SpO2: 98% 96% 93% 95%  Weight:      Height:        Intake/Output Summary (Last 24 hours) at 12/12/2022 0741 Last data filed at 12/11/2022 2100 Gross per 24 hour  Intake 978.61 ml  Output 200 ml  Net 778.61 ml      12/10/2022   11:38 PM 12/10/2022    6:32 PM 12/10/2022    1:53 PM  Last 3 Weights  Weight (lbs) 186 lb 3.2 oz 186 lb 3.2 oz 186 lb 3.2 oz  Weight (kg) 84.46 kg 84.46 kg 84.46 kg      Telemetry/ECG    ST-SR with heart rates 80-100   - Personally Reviewed  Physical Exam .   GEN: No acute distress.   Neck: No JVD Cardiac: RRR, no murmurs, rubs, or gallops.  Respiratory: Diminished to auscultation bilaterally. Respirations are unlabored at rest on room air GI: Soft, nontender, obese, non-distended  MS: No edema  Assessment & Plan .     Atypical chest pain -continues to have some right sided chest pain on rounds this morning, states that it is not as bad as prior to arrival -echocardiogram completed this morning with further recommendations to follow -continue with telemetry monitoring -EKG as needed for pain or changes -started on ASA 81 mg daily and atorvastatin 40 mg daily -with continued chest discomfort, EKG changes, and unofficial echo read with possible pulm HTN and right heart strain she is being scheduled for a R/LHC today with Dr Kirke Corin -NPO    Shortness of breath -progressive over the last 2 weeks, continues with exertional dyspnea this morning -BNP 275.2 -CTA chest negative for PE -remains on room air  -scheduled for [facility] today with recommendations to follow  Hypertension -blood pressure 113/84 -continued on hydrochlorothiazide, Toprol XL, minoxidil -vitals per un it protocol  Hyperlipidemia -started on atorvastatin  -LDL 145  History of left breast cancer -s/p left mastectomy -previously underwent adjuvant chemotherapy -underwent antiestrogen oral therapy -she continues to follow with oncology as an outpatient  Chronic pain syndrome -unclear etiology -as had several medication failures -prior to admission was going to physical therapy  For questions or updates, please contact [facility] Please consult [email or web address] for contact info under        Signed, Merie Wulf, NP

## 2022-12-12 NOTE — Plan of Care (Signed)
Patient refused education handouts re cardiac cath

## 2022-12-12 NOTE — Consult Note (Signed)
Advanced Heart Failure Team Consult Note   Primary Physician: Marjie Skiff, NP PCP-Cardiologist:  None  Reason for Consultation: Pulmonary hypertension  HPI:    Tonya Odonnell is seen today for evaluation of pulmonary hypertension at the request of Dr. Kirke Corin.   Patient has history of HTN, breast cancer s/p mastectomy and chemotherapy in 2017, hypothyroidism, and fibromyalgia.  She states that she has "pain all over," worse on the left side of her body.  She was seen by a rheumatologist at [facility] earlier this year and told that her symptoms were consistent with fibromyalgia.  She was given no definite rheumatologic diagnosis.  She was never diagnosed with sleep apnea but does snore and has daytime sleepiness.  She reports 2 months of progressive exertional dyspnea that she attributed to heat/humidity.  She is short of breath walking to the mailbox. No lightheadedness or syncope. For 2 weeks, she has been having on and off chest tightness, not exertional.  This brought her to the ER.  HS-TnI was negative, BNP mildly elevated at 275.  CTA chest showed no acute PE, no parenchymal disease.  I reviewed her echo, by my interpretation this showed EF 50-55%; RV was poorly visualized but there was a D-shaped interventricular septum and concern for RV dilation/dysfunction.  Given ongoing chest pain and abnormal echo, RHC/LHC was done today showing no CAD; mean RA 11, PA 76/38 mean 52, mean PWP 10, PVR 7.3 WU and CI 3.04.  HF service asked to see her due to new pulmonary arterial hypertension diagnosis.    Review of Systems: All systems reviewed and negative except as per HPI.   Home Medications Prior to Admission medications   Medication Sig Start Date End Date Taking? Authorizing Provider  ascorbic acid (VITAMIN C) 500 MG tablet Take 500 mg by mouth daily.   Yes [provider]  Azelaic Acid 15 % gel Apply to face every morning 08/26/22  Yes Willeen Niece, MD  cetirizine-pseudoephedrine  (ZYRTEC-D) 5-120 MG tablet Take 1 tablet by mouth 2 (two) times daily. 07/08/22  Yes Cannady, Corrie Dandy T, NP  Cholecalciferol 25 MCG (1000 UT) tablet Take 1 tablet (1,000 Units total) by mouth daily. 11/22/21  Yes Cannady, Jolene T, NP  clobetasol cream (TEMOVATE) 0.05 % Apply 1 application topically 2 (two) times daily. Apply to affected area 01/09/21  Yes Federico Flake, MD  cromolyn (OPTICROM) 4 % ophthalmic solution Place 1 drop into both eyes 4 (four) times daily. 12/10/22  Yes Cannady, Jolene T, NP  cyanocobalamin (VITAMIN B12) 1000 MCG tablet Take 1,000 mcg by mouth daily.   Yes [provider]  diclofenac Sodium (VOLTAREN) 1 % GEL Apply topically as needed.   Yes [provider]  diltiazem 2 % GEL Apply 1 Application topically 3 (three) times daily. Inside the rectum as directed. 08/21/22  Yes Meredith Pel, NP  ELIDEL 1 % cream Apply to red scaly areas on the face BID for seb derm 04/09/22  Yes Willeen Niece, MD  fluticasone [facility]) 50 MCG/ACT nasal spray Place 2 sprays into both nostrils daily. Patient taking differently: Place 2 sprays into both nostrils as needed for allergies. 12/13/20 12/11/22 Yes Vigg, Avanti, MD  hydrALAZINE (APRESOLINE) 25 MG tablet Take 1 tablet (25 mg total) by mouth 2 (two) times daily. 12/10/22  Yes Cannady, Jolene T, NP  hydrochlorothiazide (HYDRODIURIL) 12.5 MG tablet Take 12.5 mg by mouth daily.   Yes [provider]  ibuprofen (ADVIL) 200 MG tablet Take 1  tablet (200 mg total) by mouth every 6 (six) hours as needed. 09/01/22  Yes Cannady, Corrie Dandy T, NP  levothyroxine (SYNTHROID) 112 MCG tablet Take 1 tablet (112 mcg total) by mouth daily. 10/31/22  Yes Cannady, Jolene T, NP  metoprolol succinate (TOPROL-XL) 50 MG 24 hr tablet Take 1 tablet (50 mg total) by mouth in the morning and at bedtime. 11/25/21  Yes Cannady, Jolene T, NP  minoxidil (LONITEN) 2.5 MG tablet Take 1 tablet (2.5 mg total) by mouth daily. 02/25/22  Yes Willeen Niece,  MD  omeprazole (PRILOSEC) 40 MG capsule Take 1 capsule (40 mg total) by mouth daily. 03/21/22 03/22/23 Yes Cannady, Jolene T, NP  valACYclovir (VALTREX) 1000 MG tablet Take 1,000 mg by mouth daily. 08/12/22  Yes [provider]  Ivermectin (SOOLANTRA) 1 % CREA Apply 1 Application topically at bedtime. Patient not taking: Reported on 12/11/2022 09/04/21   Willeen Niece, MD    Past Medical History: Past Medical History:  Diagnosis Date   Anemia    "USE TO HAVE IT,NOT NOW,2015,2016   Arthritis    HIPS,KNEES,HANDS   Breast cancer in female (HCC)    Cancer (HCC)    LEFT   Hyperlipidemia    Hypertension    Low iron    Neuromuscular disorder (HCC)    MUSCLE PAIN,FIBROMYALGIA   Osteopenia    PVC (premature ventricular contraction)    Thyroid disease     Past Surgical History: Past Surgical History:  Procedure Laterality Date   Left Ovarian removal Left    MASTECTOMY Left    "LYMPHNODES REMOVED    Family History: Family History  Problem Relation Age of Onset   Hypertension Mother    Leukemia Mother    Stomach cancer Father    Colon cancer Neg Hx    Colon polyps Neg Hx    Crohn's disease Neg Hx    Esophageal cancer Neg Hx    Rectal cancer Neg Hx     Social History: Social History   Socioeconomic History   Marital status: Married    Spouse name: Not on file   Number of children: Not on file   Years of education: Not on file   Highest education level: Not on file  Occupational History   Occupation: disabled  Tobacco Use   Smoking status: Never   Smokeless tobacco: Never  Vaping Use   Vaping status: Never Used  Substance and Sexual Activity   Alcohol use: Never   Drug use: Never   Sexual activity: Not Currently  Other Topics Concern   Not on file  Social History Narrative   Not on file   Social Determinants of Health   Financial Resource Strain: High Risk (05/13/2021)   Received from [facility], [facility]   Overall Financial Resource Strain  (CARDIA)    Difficulty of Paying Living Expenses: Very hard  Food Insecurity: No Food Insecurity (12/11/2022)   Hunger Vital Sign    Worried About Running Out of Food in the Last Year: Never true    Ran Out of Food in the Last Year: Never true  Transportation Needs: No Transportation Needs (12/11/2022)   PRAPARE - Administrator, Civil Service (Medical): No    Lack of Transportation (Non-Medical): No  Physical Activity: Inactive (05/13/2021)   Received from [facility], [facility]   Exercise Vital Sign    Days of Exercise per Week: 0 days    Minutes of Exercise per Session: 0 min  Stress: Stress  Concern Present (05/13/2021)   Received from [facility] - Occupational Stress Questionnaire    Feeling of Stress : Very much  Social Connections: Unknown (07/13/2021)   Received from [facility], [facility]   Social Network    Social Network: Not on file    Allergies:  Allergies  Allergen Reactions   Lisinopril Other (See Comments)    cough cough     Objective:    Vital Signs:   Temp:  [97.8 F (36.6 C)-98.7 F (37.1 C)] 98.1 F (36.7 C) (10/04 1340) Pulse Rate:  [0-94] 81 (10/04 1715) Resp:  [12-23] 18 (10/04 1715) BP: (113-174)/(81-115) 144/106 (10/04 1715) SpO2:  [87 %-99 %] 92 % (10/04 1715) Last BM Date : 12/11/22  Weight change: Filed Weights   12/10/22 1832 12/10/22 2338  Weight: 84.5 kg 84.5 kg    Intake/Output:   Intake/Output Summary (Last 24 hours) at 12/12/2022 1732 Last data filed at 12/12/2022 1018 Gross per 24 hour  Intake 240 ml  Output 200 ml  Net 40 ml      Physical Exam    General:  Well appearing. No resp difficulty HEENT: normal Neck: Thick, JVP difficult. Carotids 2+ bilat; no bruits. No lymphadenopathy or thyromegaly appreciated. Cor: PMI nondisplaced. Regular rate & rhythm. No rubs, gallops or murmurs. Lungs: clear Abdomen: soft, nontender, nondistended. No  hepatosplenomegaly. No bruits or masses. Good bowel sounds. Extremities: no cyanosis, clubbing, rash, edema Neuro: alert & orientedx3, cranial nerves grossly intact. moves all 4 extremities w/o difficulty. Affect pleasant   Telemetry   NSR (personally reviewed)  EKG    NSR, inferior and anterolateral TWIs  Labs   Basic Metabolic Panel: Recent Labs  Lab 12/10/22 1838 12/11/22 0245 12/11/22 1026 12/12/22 1530 12/12/22 1534  NA 140  --  136 139 138  K 3.3*  --  4.1 3.6 3.4*  CL 105  --  106  --   --   CO2 22  --  18*  --   --   GLUCOSE 131*  --  110*  --   --   BUN 20  --  16  --   --   CREATININE 0.75  --  0.70  --   --   CALCIUM 9.7  --  8.9  --   --   MG  --  2.0  --   --   --     Liver Function Tests: Recent Labs  Lab 12/10/22 2021  AST 20  ALT 25  ALKPHOS 58  BILITOT 2.1*  PROT 6.8  ALBUMIN 4.0   No results for input(s): "LIPASE", "AMYLASE" in the last 168 hours. No results for input(s): "AMMONIA" in the last 168 hours.  CBC: Recent Labs  Lab 12/10/22 1514 12/10/22 1838 12/12/22 1530 12/12/22 1534  WBC 8.1 10.0  --   --   NEUTROABS 4.7  --   --   --   HGB 17.3* 18.0* 17.7* 17.3*  HCT 55.6* 53.7* 52.0* 51.0*  MCV 90 86.1  --   --   PLT 305 340  --   --     Cardiac Enzymes: No results for input(s): "CKTOTAL", "CKMB", "CKMBINDEX", "TROPONINI" in the last 168 hours.  BNP: BNP (last 3 results) Recent Labs    12/10/22 1514 12/10/22 2021  BNP 164.8* 275.2*    ProBNP (last 3 results) No results for input(s): "PROBNP" in the last 8760 hours.   CBG: No  results for input(s): "GLUCAP" in the last 168 hours.  Coagulation Studies: No results for input(s): "LABPROT", "INR" in the last 72 hours.   Imaging   CARDIAC CATHETERIZATION  Result Date: 12/12/2022 1.  Normal coronary arteries. 2.  Left ventricular angiography was not performed.  EF was low normal by echo.  Normal left ventricular end-diastolic pressure. 3.  Right heart  catheterization showed mildly elevated RA pressure, severe pulmonary hypertension, normal wedge pressure and normal cardiac output. RA: 11 mmHg RV: 78/6 mmHg PW: 10 mmHg PA: 76/38 with a mean of 52 mmHg.  Pulmonary vascular resistance is 7.3 Woods units Cardiac output: 5.7 with an index of 3.04. Recommendations: The patient has evidence of severe pulmonary arterial hypertension.  There is evidence of RV dysfunction on echocardiogram.  I consulted advanced heart failure to help with evaluation and management.   ECHOCARDIOGRAM COMPLETE  Result Date: 12/12/2022    ECHOCARDIOGRAM REPORT   Patient Name:   Tonya Odonnell Date of Exam: 12/12/2022 Medical Rec #:  [id number]    Height:       63.0 in Accession #:    [id number]   Weight:       186.2 lb Date of Birth:  1965/04/01    BSA:          1.876 m Patient Age:    56 years     BP:           113/84 mmHg Patient Gender: F            HR:           76 bpm. Exam Location:  [facility] Procedure: 2D Echo, Cardiac Doppler and Color Doppler Indications:     CHF-acute diastolic I50.31  History:         Patient has no prior history of Echocardiogram examinations.                  Risk Factors:Hypertension and Dyslipidemia.  Sonographer:     Cristela Blue Referring Phys:  [id number] Antonieta Iba Diagnosing Phys: Debbe Odea MD  Sonographer Comments: Image acquisition challenging due to breast implants. IMPRESSIONS  1. Left ventricular ejection fraction, by estimation, is 50 to 55%. The left ventricle has low normal function. The left ventricle has no regional wall motion abnormalities. There is mild left ventricular hypertrophy. Left ventricular diastolic parameters are indeterminate.  2. Right ventricular systolic function is low normal. The right ventricular size is not well visualized.  3. The mitral valve is normal in structure. No evidence of mitral valve regurgitation.  4. The aortic valve is tricuspid. Aortic valve regurgitation is not visualized. FINDINGS  Left Ventricle: Left  ventricular ejection fraction, by estimation, is 50 to 55%. The left ventricle has low normal function. The left ventricle has no regional wall motion abnormalities. The left ventricular internal cavity size was normal in size. There is mild left ventricular hypertrophy. Left ventricular diastolic parameters are indeterminate. Right Ventricle: The right ventricular size is not well visualized. No increase in right ventricular wall thickness. Right ventricular systolic function is low normal. Left Atrium: Left atrial size was normal in size. Right Atrium: Right atrial size was not well visualized. Pericardium: There is no evidence of pericardial effusion. Mitral Valve: The mitral valve is normal in structure. No evidence of mitral valve regurgitation. Tricuspid Valve: The tricuspid valve is normal in structure. Tricuspid valve regurgitation is not demonstrated. Aortic Valve: The aortic valve is tricuspid. Aortic valve regurgitation is not visualized. Pulmonic Valve: The pulmonic  valve was normal in structure. Pulmonic valve regurgitation is not visualized. Aorta: The aortic root is normal in size and structure. Venous: The inferior vena cava was not well visualized. IAS/Shunts: The interatrial septum was not well visualized.  LEFT VENTRICLE PLAX 2D LVIDd:         3.30 cm LVIDs:         2.60 cm LV PW:         1.30 cm LV IVS:        1.50 cm LVOT diam:     2.00 cm LVOT Area:     3.14 cm  LEFT ATRIUM         Index LA diam:    2.20 cm 1.17 cm/m   AORTA Ao Root diam: 3.17 cm  SHUNTS Systemic Diam: 2.00 cm Debbe Odea MD Electronically signed by Debbe Odea MD Signature Date/Time: 12/12/2022/12:17:49 PM    Final      Medications:     Current Medications:  [MAR Hold] ascorbic acid  500 mg Oral Daily   [MAR Hold] aspirin EC  81 mg Oral Daily   [MAR Hold] cholecalciferol  1,000 Units Oral Daily   [MAR Hold] cyanocobalamin  1,000 mcg Oral Daily   [MAR Hold] enoxaparin (LOVENOX) injection  40 mg  Subcutaneous Q24H   [START ON 12/13/2022] furosemide  20 mg Oral Daily   [MAR Hold] hydrALAZINE  25 mg Oral BID   [MAR Hold] hydrochlorothiazide  12.5 mg Oral Daily   [MAR Hold] levothyroxine  112 mcg Oral Q0600   [MAR Hold] metoprolol succinate  50 mg Oral BID   [MAR Hold] minoxidil  1.25 mg Oral Daily   [MAR Hold] pantoprazole  40 mg Oral Daily   sodium chloride flush  3 mL Intravenous Q12H   tadalafil  20 mg Oral Daily    Infusions:  sodium chloride     sodium chloride        Assessment/Plan   1. Pulmonary hypertension/RV failure: I reviewed echo this admission which showed  EF 50-55%; RV was poorly visualized but there was a D-shaped interventricular septum and concern for RV dilation/dysfunction.  Given ongoing chest pain and abnormal echo, RHC/LHC was done today showing no CAD; mean RA 11, PA 76/38 mean 52, mean PWP 10, PVR 7.3 WU and CI 3.04.  PE CT this admission showed no acute PE and no parenchymal lung disease.  Rheumatology evaluation earlier this year showed no definitive rheumatologic diagnosis, she was told that she has fibromyalgia.  RF and CCP were negative at that time. HIV negative this admission. 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  Needs further workup and treatment.  - Would start Lasix 20 mg daily given mildly elevated RA pressure and shortness of breath.  - PCWP not elevated, would start PH treatment with tadalafil 20 mg daily. She will need combination therapy, will try to get this on board rapidly as an outpatient.  - V/Q scan to rule out chronic PE (think unlikely with normal CTA chest).  - Needs sleep study as outpatient, suspect OSA.  - Would send ESR and CRP again, also need to send ANA, scleroderma antibody, centromere antibody.  She was seen by rheumatology earlier this year and not thought to have a definite rheum diagnosis.  However, in light of diagnosis of group 1 PH, may be worthwhile to revisit this.  - She  will need close outpatient followup in CHF clinic to get her on [facility] treatment, will  arrange appt.  2. Systemic hypertension: Continue current meds.  3. Hypothyroidism: Will send TSH to make sure this is controlled.  4. Chest pain: Troponin negative, normal coronaries.  It is possible that this could be related to pulmonary hypertension, but also may be a product of her fibromyalgia.   Length of Stay: 0  Marca Ancona, MD  12/12/2022, 5:32 PM  Advanced Heart Failure Team Pager [phone] (M-F; 7a - 5p)  Please contact [facility] for night-coverage after hours (4p -7a ) and weekends on [email or web address]

## 2022-12-12 NOTE — Progress Notes (Signed)
*  PRELIMINARY RESULTS* Echocardiogram 2D Echocardiogram has been performed.  Cristela Blue 12/12/2022, 8:45 AM

## 2022-12-13 DIAGNOSIS — I50810 Right heart failure, unspecified: Secondary | ICD-10-CM

## 2022-12-13 DIAGNOSIS — E876 Hypokalemia: Secondary | ICD-10-CM | POA: Diagnosis not present

## 2022-12-13 DIAGNOSIS — E669 Obesity, unspecified: Secondary | ICD-10-CM | POA: Diagnosis not present

## 2022-12-13 DIAGNOSIS — R9431 Abnormal electrocardiogram [ECG] [EKG]: Secondary | ICD-10-CM

## 2022-12-13 DIAGNOSIS — R0609 Other forms of dyspnea: Secondary | ICD-10-CM | POA: Diagnosis not present

## 2022-12-13 DIAGNOSIS — R931 Abnormal findings on diagnostic imaging of heart and coronary circulation: Secondary | ICD-10-CM | POA: Diagnosis not present

## 2022-12-13 DIAGNOSIS — I272 Pulmonary hypertension, unspecified: Secondary | ICD-10-CM

## 2022-12-13 DIAGNOSIS — E039 Hypothyroidism, unspecified: Secondary | ICD-10-CM | POA: Diagnosis not present

## 2022-12-13 DIAGNOSIS — I160 Hypertensive urgency: Secondary | ICD-10-CM | POA: Diagnosis not present

## 2022-12-13 DIAGNOSIS — R0789 Other chest pain: Secondary | ICD-10-CM | POA: Diagnosis not present

## 2022-12-13 LAB — BASIC METABOLIC PANEL
Anion gap: 11 (ref 5–15)
BUN: 26 mg/dL — ABNORMAL HIGH (ref 6–20)
CO2: 21 mmol/L — ABNORMAL LOW (ref 22–32)
Calcium: 9.7 mg/dL (ref 8.9–10.3)
Chloride: 103 mmol/L (ref 98–111)
Creatinine, Ser: 0.88 mg/dL (ref 0.44–1.00)
GFR, Estimated: >60 mL/min (ref >60)
Glucose, Bld: 106 mg/dL — ABNORMAL HIGH (ref 70–99)
Potassium: 3.3 mmol/L — ABNORMAL LOW (ref 3.5–5.1)
Sodium: 135 mmol/L (ref 135–145)

## 2022-12-13 LAB — CBC
HCT: 50.6 % — ABNORMAL HIGH (ref 36.0–46.0)
Hemoglobin: 17.5 g/dL — ABNORMAL HIGH (ref 12.0–15.0)
MCH: 29 pg (ref 26.0–34.0)
MCHC: 34.6 g/dL (ref 30.0–36.0)
MCV: 83.9 fL (ref 80.0–100.0)
Platelets: 317 10*3/uL (ref 150–400)
RBC: 6.03 MIL/uL — ABNORMAL HIGH (ref 3.87–5.11)
RDW: 13.2 % (ref 11.5–15.5)
WBC: 9.2 10*3/uL (ref 4.0–10.5)
nRBC: 0 % (ref 0.0–0.2)

## 2022-12-13 MED ORDER — TADALAFIL 20 MG PO TABS: 20.0000 mg | ORAL_TABLET | Freq: Every day | ORAL | 0 refills | Status: DC

## 2022-12-13 MED ORDER — FUROSEMIDE 20 MG PO TABS: 20.0000 mg | ORAL_TABLET | Freq: Every day | ORAL | 2 refills | Status: DC

## 2022-12-13 MED ORDER — POTASSIUM CHLORIDE CRYS ER 20 MEQ PO TBCR
20.0000 meq | EXTENDED_RELEASE_TABLET | Freq: Every day | ORAL | Status: DC
  Administered 2022-12-13: 20 meq via ORAL
  Filled 2022-12-13: qty 1

## 2022-12-13 MED ORDER — POTASSIUM CHLORIDE CRYS ER 20 MEQ PO TBCR: 20.0000 meq | EXTENDED_RELEASE_TABLET | Freq: Every day | ORAL | 0 refills | Status: DC

## 2022-12-13 MED ORDER — BUTALBITAL-APAP-CAFFEINE 50-325-40 MG PO TABS
2.0000 | ORAL_TABLET | Freq: Four times a day (QID) | ORAL | Status: DC | PRN
  Administered 2022-12-13: 2 via ORAL
  Filled 2022-12-13: qty 2

## 2022-12-13 NOTE — Progress Notes (Signed)
   Patient Name: Tonya Odonnell Date of Encounter: 12/13/2022 [facility] Cardiologist: None   Interval Summary  .    Not feeling any different today.  Continues to feel tired and short of breath with exertion.  She is unsure whether she has significant urine output with Lasix.  She thinks it is less than what she had with hydrochlorothiazide.  Vital Signs .    Vitals:   12/12/22 2257 12/13/22 0447 12/13/22 0840 12/13/22 1154  BP: (!) 142/88 99/71 114/80 118/76  Pulse: 86 74 67 89  Resp: 20 16 17 16   Temp: 97.9 F (36.6 C)  97.6 F (36.4 C) 97.9 F (36.6 C)  TempSrc: Oral  Oral Oral  SpO2: 94% 93% 95% 94%  Weight:      Height:        Intake/Output Summary (Last 24 hours) at 12/13/2022 1419 Last data filed at 12/12/2022 2100 Gross per 24 hour  Intake 240 ml  Output --  Net 240 ml      12/10/2022   11:38 PM 12/10/2022    6:32 PM 12/10/2022    1:53 PM  Last 3 Weights  Weight (lbs) 186 lb 3.2 oz 186 lb 3.2 oz 186 lb 3.2 oz  Weight (kg) 84.46 kg 84.46 kg 84.46 kg      Telemetry/ECG    Sinus rhythm.  No events.- Personally Reviewed  Physical Exam .   GEN: No acute distress.   Neck: +JVD to upper neck at 45 degrees. Cardiac: RRR, no murmurs, rubs, or gallops.  Respiratory: Clear to auscultation bilaterally. GI: Soft, nontender, non-distended  MS: No edema  Assessment & Plan .     # Pulmonary arterial hypertension: # RV Failure:  Patient has been seen by the heart failure service due to pulmonary hypertension and RV failure.  Right heart cath this admission revealed a mean pulmonary pressure of 52 with PCWP 10.  CT PE protocol was negative for underlying lung disease or PE.  VQ scan is pending.  She was started on tadalafil and oral Lasix.  Autoimmune labs are also pending.  Plan for outpatient sleep study and outpatient follow-up with heart failure service.  # Hypertension: Blood pressure stable on minoxidil, metoprolol, hydrochlorothiazide, and hydralazine.   Supplement potassium.  Would consider switching HCTZ to spironolactone if thought favorable by the heart failure service.  # Hypothyroidism: TSH wnl 10/2022.  # Chest pain: Coronaries were nonobstructive on cath this admission.  Discontinue aspirin.   For questions or updates, please contact [facility] Please consult [email or web address] for contact info under        Signed, Chilton Si, MD

## 2022-12-13 NOTE — Plan of Care (Signed)
  Problem: Activity: Goal: Ability to tolerate increased activity will improve Outcome: Progressing   Problem: Cardiac: Goal: Ability to achieve and maintain adequate cardiovascular perfusion will improve Outcome: Progressing   Problem: Education: Goal: Understanding of cardiac disease, CV risk reduction, and recovery process will improve Outcome: Progressing   Problem: Education: Goal: Knowledge of General Education information will improve Description: Including pain rating scale, medication(s)/side effects and non-pharmacologic comfort measures Outcome: Progressing   Problem: Health Behavior/Discharge Planning: Goal: Ability to safely manage health-related needs after discharge will improve Outcome: Progressing

## 2022-12-13 NOTE — Discharge Summary (Signed)
Physician Discharge Summary   Patient: Tonya Odonnell MRN: [id number] DOB: May 18, 1965  Admit date:     12/10/2022  Discharge date: 12/13/22  Discharge Physician: Marcelino Duster   PCP: Marjie Skiff, NP   Recommendations at discharge:  {Tip this will not be part of the note when signed- Example include specific recommendations for outpatient follow-up, pending tests to follow-up on. (Optional):26781}  PCP follow up in 1 week Heart failure clinic follow up as scheduled. Outpatient nuclear V/Q scan, sleep study suggested. 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  Discharge Diagnoses: Principal Problem:   Chest pain Active Problems:   Dyspnea   Hypertensive urgency   Hypokalemia   Hypothyroidism   GERD without esophagitis   Abnormal EKG   Unstable angina (HCC)   Pulmonary hypertension, unspecified (HCC)   Right ventricular failure (HCC)  Resolved Problems:   * No resolved hospital problems. [facility] Course: No notes on file  Assessment and Plan: * Chest pain - This is associated with abnormal EKG without previous ones for comparison. - The patient will be admitted to an observation cardiac telemetry bed. - Will follow serial troponins and EKGs. - The patient will be placed on aspirin as well as p.r.n. sublingual nitroglycerin and morphine sulfate for pain. - We will obtain a cardiology consult in a.m. for further cardiac risk stratification. - I notified [facility] about the patient   Dyspnea - She has elevated BN P without other significant symptoms of CHF. - Will obtain a 2D echo. - Cardiology consult will be obtained as mentioned above. - She was given a dose of 20 mg of IV Lasix we will continue as needed  Hypokalemia - Potassium will be replaced.  Hypertensive urgency - This could be contributing to new onset CHF. - We will continue her antihypertensive therapy.  GERD without esophagitis - We  will continue PPI therapy.  Hypothyroidism - We will resume Synthroid.      {Tip this will not be part of the note when signed Body mass index is 32.98 kg/m. , ,  (Optional):26781}  {(NOTE) Pain control PDMP Statment (Optional):26782} Consultants: *** Procedures performed: ***  Disposition: {Plan; Disposition:26390} Diet recommendation:  Discharge Diet Orders (From admission, onward)     Start     Ordered   12/13/22 0000  Diet - low sodium heart healthy        12/13/22 1438           {Diet_Plan:26776} DISCHARGE MEDICATION: Allergies as of 12/13/2022       Reactions   Lisinopril Other (See Comments)   cough cough        Medication List     STOP taking these medications    ibuprofen 200 MG tablet Commonly known as: ADVIL       TAKE these medications    ascorbic acid 500 MG tablet Commonly known as: VITAMIN C Take 500 mg by mouth daily.   Azelaic Acid 15 % gel Apply to face every morning   cetirizine-pseudoephedrine 5-120 MG tablet Commonly known as: ZYRTEC-D Take 1 tablet by mouth 2 (two) times daily.   Cholecalciferol 25 MCG (1000 UT) tablet Take 1 tablet (1,000 Units total) by mouth daily.   clobetasol cream 0.05 % Commonly known as: TEMOVATE Apply 1 application topically 2 (two) times daily. Apply to affected area   cromolyn 4 % ophthalmic solution Commonly known as: OPTICROM Place 1 drop into both eyes 4 (four) times daily.  Physician Discharge Summary   Patient: Tonya Odonnell MRN: [id number] DOB: May 18, 1965  Admit date:     12/10/2022  Discharge date: 12/13/22  Discharge Physician: Marcelino Duster   PCP: Marjie Skiff, NP   Recommendations at discharge:  {Tip this will not be part of the note when signed- Example include specific recommendations for outpatient follow-up, pending tests to follow-up on. (Optional):26781}  PCP follow up in 1 week Heart failure clinic follow up as scheduled. Outpatient nuclear V/Q scan, sleep study suggested. 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  Discharge Diagnoses: Principal Problem:   Chest pain Active Problems:   Dyspnea   Hypertensive urgency   Hypokalemia   Hypothyroidism   GERD without esophagitis   Abnormal EKG   Unstable angina (HCC)   Pulmonary hypertension, unspecified (HCC)   Right ventricular failure (HCC)  Resolved Problems:   * No resolved hospital problems. [facility] Course: No notes on file  Assessment and Plan: * Chest pain - This is associated with abnormal EKG without previous ones for comparison. - The patient will be admitted to an observation cardiac telemetry bed. - Will follow serial troponins and EKGs. - The patient will be placed on aspirin as well as p.r.n. sublingual nitroglycerin and morphine sulfate for pain. - We will obtain a cardiology consult in a.m. for further cardiac risk stratification. - I notified [facility] about the patient   Dyspnea - She has elevated BN P without other significant symptoms of CHF. - Will obtain a 2D echo. - Cardiology consult will be obtained as mentioned above. - She was given a dose of 20 mg of IV Lasix we will continue as needed  Hypokalemia - Potassium will be replaced.  Hypertensive urgency - This could be contributing to new onset CHF. - We will continue her antihypertensive therapy.  GERD without esophagitis - We  will continue PPI therapy.  Hypothyroidism - We will resume Synthroid.      {Tip this will not be part of the note when signed Body mass index is 32.98 kg/m. , ,  (Optional):26781}  {(NOTE) Pain control PDMP Statment (Optional):26782} Consultants: *** Procedures performed: ***  Disposition: {Plan; Disposition:26390} Diet recommendation:  Discharge Diet Orders (From admission, onward)     Start     Ordered   12/13/22 0000  Diet - low sodium heart healthy        12/13/22 1438           {Diet_Plan:26776} DISCHARGE MEDICATION: Allergies as of 12/13/2022       Reactions   Lisinopril Other (See Comments)   cough cough        Medication List     STOP taking these medications    ibuprofen 200 MG tablet Commonly known as: ADVIL       TAKE these medications    ascorbic acid 500 MG tablet Commonly known as: VITAMIN C Take 500 mg by mouth daily.   Azelaic Acid 15 % gel Apply to face every morning   cetirizine-pseudoephedrine 5-120 MG tablet Commonly known as: ZYRTEC-D Take 1 tablet by mouth 2 (two) times daily.   Cholecalciferol 25 MCG (1000 UT) tablet Take 1 tablet (1,000 Units total) by mouth daily.   clobetasol cream 0.05 % Commonly known as: TEMOVATE Apply 1 application topically 2 (two) times daily. Apply to affected area   cromolyn 4 % ophthalmic solution Commonly known as: OPTICROM Place 1 drop into both eyes 4 (four) times daily.  [id number]   Weight:       186.2 lb Date of Birth:  [birth year]    BSA:          1.876 m Patient Age:    56 years     BP:           113/84 mmHg Patient Gender: F            HR:           76 bpm. Exam Location:  [facility] Procedure: 2D Echo, Cardiac Doppler and Color Doppler Indications:     CHF-acute diastolic I50.31  History:         Patient has no prior history of Echocardiogram examinations.                  Risk Factors:Hypertension and Dyslipidemia.  Sonographer:     Cristela Blue Referring Phys:  [id number] Antonieta Iba Diagnosing Phys: Debbe Odea MD  Sonographer Comments: Image acquisition challenging due to breast implants. IMPRESSIONS  1. Left ventricular ejection fraction, by estimation, is 50 to 55%. The left ventricle has low normal function. The left ventricle has no regional wall motion abnormalities. There is mild left ventricular hypertrophy. Left ventricular diastolic parameters are indeterminate.  2. Right ventricular systolic function is low normal. The right ventricular size is not well visualized.  3. The mitral valve is normal in  structure. No evidence of mitral valve regurgitation.  4. The aortic valve is tricuspid. Aortic valve regurgitation is not visualized. FINDINGS  Left Ventricle: Left ventricular ejection fraction, by estimation, is 50 to 55%. The left ventricle has low normal function. The left ventricle has no regional wall motion abnormalities. The left ventricular internal cavity size was normal in size. There is mild left ventricular hypertrophy. Left ventricular diastolic parameters are indeterminate. Right Ventricle: The right ventricular size is not well visualized. No increase in right ventricular wall thickness. Right ventricular systolic function is low normal. Left Atrium: Left atrial size was normal in size. Right Atrium: Right atrial size was not well visualized. Pericardium: There is no evidence of pericardial effusion. Mitral Valve: The mitral valve is normal in structure. No evidence of mitral valve regurgitation. Tricuspid Valve: The tricuspid valve is normal in structure. Tricuspid valve regurgitation is not demonstrated. Aortic Valve: The aortic valve is tricuspid. Aortic valve regurgitation is not visualized. Pulmonic Valve: The pulmonic valve was normal in structure. Pulmonic valve regurgitation is not visualized. Aorta: The aortic root is normal in size and structure. Venous: The inferior vena cava was not well visualized. IAS/Shunts: The interatrial septum was not well visualized.  LEFT VENTRICLE PLAX 2D LVIDd:         3.30 cm LVIDs:         2.60 cm LV PW:         1.30 cm LV IVS:        1.50 cm LVOT diam:     2.00 cm LVOT Area:     3.14 cm  LEFT ATRIUM         Index LA diam:    2.20 cm 1.17 cm/m   AORTA Ao Root diam: 3.17 cm  SHUNTS Systemic Diam: 2.00 cm Debbe Odea MD Electronically signed by Debbe Odea MD Signature Date/Time: 12/12/2022/12:17:49 PM    Final    CT Angio Chest PE W and/or Wo Contrast  Result Date: 12/10/2022 CLINICAL DATA:  Chest pain for 2 weeks, initial encounter EXAM: CT  ANGIOGRAPHY CHEST WITH CONTRAST TECHNIQUE: Multidetector CT imaging  [id number]   Weight:       186.2 lb Date of Birth:  [birth year]    BSA:          1.876 m Patient Age:    56 years     BP:           113/84 mmHg Patient Gender: F            HR:           76 bpm. Exam Location:  [facility] Procedure: 2D Echo, Cardiac Doppler and Color Doppler Indications:     CHF-acute diastolic I50.31  History:         Patient has no prior history of Echocardiogram examinations.                  Risk Factors:Hypertension and Dyslipidemia.  Sonographer:     Cristela Blue Referring Phys:  [id number] Antonieta Iba Diagnosing Phys: Debbe Odea MD  Sonographer Comments: Image acquisition challenging due to breast implants. IMPRESSIONS  1. Left ventricular ejection fraction, by estimation, is 50 to 55%. The left ventricle has low normal function. The left ventricle has no regional wall motion abnormalities. There is mild left ventricular hypertrophy. Left ventricular diastolic parameters are indeterminate.  2. Right ventricular systolic function is low normal. The right ventricular size is not well visualized.  3. The mitral valve is normal in  structure. No evidence of mitral valve regurgitation.  4. The aortic valve is tricuspid. Aortic valve regurgitation is not visualized. FINDINGS  Left Ventricle: Left ventricular ejection fraction, by estimation, is 50 to 55%. The left ventricle has low normal function. The left ventricle has no regional wall motion abnormalities. The left ventricular internal cavity size was normal in size. There is mild left ventricular hypertrophy. Left ventricular diastolic parameters are indeterminate. Right Ventricle: The right ventricular size is not well visualized. No increase in right ventricular wall thickness. Right ventricular systolic function is low normal. Left Atrium: Left atrial size was normal in size. Right Atrium: Right atrial size was not well visualized. Pericardium: There is no evidence of pericardial effusion. Mitral Valve: The mitral valve is normal in structure. No evidence of mitral valve regurgitation. Tricuspid Valve: The tricuspid valve is normal in structure. Tricuspid valve regurgitation is not demonstrated. Aortic Valve: The aortic valve is tricuspid. Aortic valve regurgitation is not visualized. Pulmonic Valve: The pulmonic valve was normal in structure. Pulmonic valve regurgitation is not visualized. Aorta: The aortic root is normal in size and structure. Venous: The inferior vena cava was not well visualized. IAS/Shunts: The interatrial septum was not well visualized.  LEFT VENTRICLE PLAX 2D LVIDd:         3.30 cm LVIDs:         2.60 cm LV PW:         1.30 cm LV IVS:        1.50 cm LVOT diam:     2.00 cm LVOT Area:     3.14 cm  LEFT ATRIUM         Index LA diam:    2.20 cm 1.17 cm/m   AORTA Ao Root diam: 3.17 cm  SHUNTS Systemic Diam: 2.00 cm Debbe Odea MD Electronically signed by Debbe Odea MD Signature Date/Time: 12/12/2022/12:17:49 PM    Final    CT Angio Chest PE W and/or Wo Contrast  Result Date: 12/10/2022 CLINICAL DATA:  Chest pain for 2 weeks, initial encounter EXAM: CT  ANGIOGRAPHY CHEST WITH CONTRAST TECHNIQUE: Multidetector CT imaging  Physician Discharge Summary   Patient: Tonya Odonnell MRN: [id number] DOB: May 18, 1965  Admit date:     12/10/2022  Discharge date: 12/13/22  Discharge Physician: Marcelino Duster   PCP: Marjie Skiff, NP   Recommendations at discharge:  {Tip this will not be part of the note when signed- Example include specific recommendations for outpatient follow-up, pending tests to follow-up on. (Optional):26781}  PCP follow up in 1 week Heart failure clinic follow up as scheduled. Outpatient nuclear V/Q scan, sleep study suggested. 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  Discharge Diagnoses: Principal Problem:   Chest pain Active Problems:   Dyspnea   Hypertensive urgency   Hypokalemia   Hypothyroidism   GERD without esophagitis   Abnormal EKG   Unstable angina (HCC)   Pulmonary hypertension, unspecified (HCC)   Right ventricular failure (HCC)  Resolved Problems:   * No resolved hospital problems. [facility] Course: No notes on file  Assessment and Plan: * Chest pain - This is associated with abnormal EKG without previous ones for comparison. - The patient will be admitted to an observation cardiac telemetry bed. - Will follow serial troponins and EKGs. - The patient will be placed on aspirin as well as p.r.n. sublingual nitroglycerin and morphine sulfate for pain. - We will obtain a cardiology consult in a.m. for further cardiac risk stratification. - I notified [facility] about the patient   Dyspnea - She has elevated BN P without other significant symptoms of CHF. - Will obtain a 2D echo. - Cardiology consult will be obtained as mentioned above. - She was given a dose of 20 mg of IV Lasix we will continue as needed  Hypokalemia - Potassium will be replaced.  Hypertensive urgency - This could be contributing to new onset CHF. - We will continue her antihypertensive therapy.  GERD without esophagitis - We  will continue PPI therapy.  Hypothyroidism - We will resume Synthroid.      {Tip this will not be part of the note when signed Body mass index is 32.98 kg/m. , ,  (Optional):26781}  {(NOTE) Pain control PDMP Statment (Optional):26782} Consultants: *** Procedures performed: ***  Disposition: {Plan; Disposition:26390} Diet recommendation:  Discharge Diet Orders (From admission, onward)     Start     Ordered   12/13/22 0000  Diet - low sodium heart healthy        12/13/22 1438           {Diet_Plan:26776} DISCHARGE MEDICATION: Allergies as of 12/13/2022       Reactions   Lisinopril Other (See Comments)   cough cough        Medication List     STOP taking these medications    ibuprofen 200 MG tablet Commonly known as: ADVIL       TAKE these medications    ascorbic acid 500 MG tablet Commonly known as: VITAMIN C Take 500 mg by mouth daily.   Azelaic Acid 15 % gel Apply to face every morning   cetirizine-pseudoephedrine 5-120 MG tablet Commonly known as: ZYRTEC-D Take 1 tablet by mouth 2 (two) times daily.   Cholecalciferol 25 MCG (1000 UT) tablet Take 1 tablet (1,000 Units total) by mouth daily.   clobetasol cream 0.05 % Commonly known as: TEMOVATE Apply 1 application topically 2 (two) times daily. Apply to affected area   cromolyn 4 % ophthalmic solution Commonly known as: OPTICROM Place 1 drop into both eyes 4 (four) times daily.

## 2022-12-13 NOTE — Progress Notes (Signed)
Discharge instructions given to and reviewed with patient. Patient verbalized understanding of all discharge instructions including follow up care, new prescriptions and when to call the doctor. Waiting for husband to arrive to take her home.

## 2022-12-13 NOTE — Progress Notes (Signed)
Patient left unit alert by wheelchair with no distress noted. Cletis [place], NT taking patient [place] [facility] mall where husband will drive her home.

## 2022-12-15 ENCOUNTER — Encounter: Payer: Self-pay | Admitting: Cardiovascular Disease

## 2022-12-15 ENCOUNTER — Other Ambulatory Visit: Payer: Self-pay

## 2022-12-15 ENCOUNTER — Telehealth (HOSPITAL_COMMUNITY): Payer: Self-pay | Admitting: Cardiology

## 2022-12-15 ENCOUNTER — Emergency Department: Payer: Medicaid Other

## 2022-12-15 ENCOUNTER — Emergency Department
Admission: EM | Admit: 2022-12-15 | Discharge: 2022-12-15 | Disposition: A | Discharge status: Home / Self Care | Payer: Medicaid Other | Attending: Emergency Medicine | Admitting: Emergency Medicine

## 2022-12-15 DIAGNOSIS — Z853 Personal history of malignant neoplasm of breast: Secondary | ICD-10-CM | POA: Insufficient documentation

## 2022-12-15 DIAGNOSIS — D259 Leiomyoma of uterus, unspecified: Secondary | ICD-10-CM | POA: Diagnosis not present

## 2022-12-15 DIAGNOSIS — R0789 Other chest pain: Secondary | ICD-10-CM | POA: Diagnosis not present

## 2022-12-15 DIAGNOSIS — I10 Essential (primary) hypertension: Secondary | ICD-10-CM | POA: Diagnosis not present

## 2022-12-15 DIAGNOSIS — M545 Low back pain, unspecified: Secondary | ICD-10-CM | POA: Diagnosis not present

## 2022-12-15 DIAGNOSIS — R079 Chest pain, unspecified: Secondary | ICD-10-CM | POA: Diagnosis not present

## 2022-12-15 DIAGNOSIS — R0602 Shortness of breath: Secondary | ICD-10-CM | POA: Insufficient documentation

## 2022-12-15 DIAGNOSIS — E039 Hypothyroidism, unspecified: Secondary | ICD-10-CM | POA: Insufficient documentation

## 2022-12-15 DIAGNOSIS — K5730 Diverticulosis of large intestine without perforation or abscess without bleeding: Secondary | ICD-10-CM | POA: Diagnosis not present

## 2022-12-15 DIAGNOSIS — I728 Aneurysm of other specified arteries: Secondary | ICD-10-CM | POA: Diagnosis not present

## 2022-12-15 LAB — CK: Total CK: 29 U/L — ABNORMAL LOW (ref 38–234)

## 2022-12-15 LAB — ANA W/REFLEX: Anti Nuclear Antibody (ANA): NEGATIVE

## 2022-12-15 LAB — TROPONIN I (HIGH SENSITIVITY)
Troponin I (High Sensitivity): 7 ng/L (ref <18)
Troponin I (High Sensitivity): 8 ng/L (ref <18)

## 2022-12-15 LAB — ANTI-SCLERODERMA ANTIBODY: Scleroderma (Scl-70) (ENA) Antibody, IgG: <0.2 AI (ref 0.0–0.9)

## 2022-12-15 LAB — LIPOPROTEIN A (LPA): Lipoprotein (a): 66.8 nmol/L — ABNORMAL HIGH (ref <75.0)

## 2022-12-15 LAB — BASIC METABOLIC PANEL
Anion gap: 12 (ref 5–15)
BUN: 22 mg/dL — ABNORMAL HIGH (ref 6–20)
CO2: 24 mmol/L (ref 22–32)
Calcium: 9.8 mg/dL (ref 8.9–10.3)
Chloride: 100 mmol/L (ref 98–111)
Creatinine, Ser: 0.9 mg/dL (ref 0.44–1.00)
GFR, Estimated: >60 mL/min (ref >60)
Glucose, Bld: 157 mg/dL — ABNORMAL HIGH (ref 70–99)
Potassium: 3.5 mmol/L (ref 3.5–5.1)
Sodium: 136 mmol/L (ref 135–145)

## 2022-12-15 LAB — CBC
HCT: 53.5 % — ABNORMAL HIGH (ref 36.0–46.0)
Hemoglobin: 17.9 g/dL — ABNORMAL HIGH (ref 12.0–15.0)
MCH: 28.6 pg (ref 26.0–34.0)
MCHC: 33.5 g/dL (ref 30.0–36.0)
MCV: 85.6 fL (ref 80.0–100.0)
Platelets: 348 10*3/uL (ref 150–400)
RBC: 6.25 MIL/uL — ABNORMAL HIGH (ref 3.87–5.11)
RDW: 13.1 % (ref 11.5–15.5)
WBC: 7.4 10*3/uL (ref 4.0–10.5)
nRBC: 0 % (ref 0.0–0.2)

## 2022-12-15 MED ORDER — TRAMADOL HCL 50 MG PO TABS
50.0000 mg | ORAL_TABLET | Freq: Three times a day (TID) | ORAL | 0 refills | Status: DC | PRN
Start: 2022-12-15 — End: 2022-12-20

## 2022-12-15 MED ORDER — KETOROLAC TROMETHAMINE 30 MG/ML IJ SOLN
30.0000 mg | Freq: Once | INTRAMUSCULAR | Status: AC
  Administered 2022-12-15: 30 mg via INTRAVENOUS
  Filled 2022-12-15: qty 1

## 2022-12-15 MED ORDER — IOHEXOL 350 MG/ML SOLN
100.0000 mL | Freq: Once | INTRAVENOUS | Status: AC | PRN
  Administered 2022-12-15: 100 mL via INTRAVENOUS

## 2022-12-15 MED ORDER — CYCLOBENZAPRINE HCL 10 MG PO TABS: 10.0000 mg | ORAL_TABLET | Freq: Three times a day (TID) | ORAL | 0 refills | Status: DC | PRN

## 2022-12-15 NOTE — Discharge Instructions (Signed)
You may take the tramadol and/or the Flexeril as needed for pain over the next few days.  You can also just take over-the-counter Tylenol.  Follow-up with the cardiologist next week as scheduled.  Return to the ER for new, worsening, or persistent severe back pain, numbness or weakness in her legs, difficulty walking, fever, shortness of breath, chest pain, weakness, or any other new or worsening symptoms that concern you.

## 2022-12-15 NOTE — ED Notes (Signed)
See triage notes. Patient c/o lower back pain that radiates down both legs and started yesterday. Recent cardiac cath.

## 2022-12-15 NOTE — Telephone Encounter (Signed)
Patient called to report pulling pain in lower abdomen since starting new meds since discharge.  Describes pain as period cramps however she is post menopausal-feels this is the best way to describe in english   Denies CP, SOB, Swelling,weight changes   HFU 10/16  Advised can review above with provider however may need to contact PCP and or ER evaluation in the event symptoms are not 100% cardiac in nature

## 2022-12-15 NOTE — ED Provider Notes (Signed)
[facility] Provider Note    Event Date/Time   First MD Initiated Contact with Patient 12/15/22 1506     (approximate)   History   Back Pain and Shortness of Breath   HPI  Tonya Odonnell is a 57 y.o. female with a history of breast cancer, hypertension, dyslipidemia, and hypothyroidism who presents with bilateral low back pain since yesterday, severe intensity, radiating down both legs, and not associated with any numbness or weakness.  The patient has had no difficulty walking.  She has no incontinence.  She stated at one point that she has never had pain like this before, but then at another point stated that she has a history of similar chronic back pain, follows with a neurologist, no cause was ever found.  That chronic pain had subsided over the last several months.  The patient was just admitted for pulmonary hypertension and right heart failure and started on Lasix.  She is concerned that the back and leg pain is related to the Lasix.  She states that associated with the acute pain today in her lower back, she started having some shortness of breath and discomfort in her chest although this has now resolved.  I reviewed the past medical records.  The patient was just admitted to the hospitalist service from 10/3 to 10/7 for chest pain.  She had a CTA of the chest which was negative for PE.  Heart catheterization showed no CAD.  She is found to have pulmonary hypertension.   Physical Exam   Triage Vital Signs: ED Triage Vitals  Encounter Vitals Group     BP 12/15/22 1211 102/64     Systolic BP Percentile --      Diastolic BP Percentile --      Pulse Rate 12/15/22 1211 (!) 106     Resp 12/15/22 1211 20     Temp 12/15/22 1211 97.6 F (36.4 C)     Temp src --      SpO2 12/15/22 1211 96 %     Weight 12/15/22 1212 188 lb (85.3 kg)     Height 12/15/22 1212 5\' 3"  (1.6 m)     Head Circumference --      Peak Flow --      Pain Score 12/15/22 1212 6     Pain  Loc --      Pain Education --      Exclude from Growth Chart --     Most recent vital signs: Vitals:   12/15/22 1211 12/15/22 1627  BP: 102/64 102/66  Pulse: (!) 106 96  Resp: 20 20  Temp: 97.6 F (36.4 C) 97.7 F (36.5 C)  SpO2: 96% 92%     General: Awake, no distress.  CV:  Good peripheral perfusion.  Resp:  Normal effort.  Abd:  No distention.  Other:  No midline spinal tenderness.  No paraspinal tenderness.  5/5 motor strength and intact sensation to bilateral lower extremities.  2+ DP pulses and normal cap refill bilaterally.   ED Results / Procedures / Treatments   Labs (all labs ordered are listed, but only abnormal results are displayed) Labs Reviewed  BASIC METABOLIC PANEL - Abnormal; Notable for the following components:      Result Value   Glucose, Bld 157 (*)    BUN 22 (*)    All other components within normal limits  CBC - Abnormal; Notable for the following components:   RBC 6.25 (*)    Hemoglobin 17.9 (*)  HCT 53.5 (*)    All other components within normal limits  CK - Abnormal; Notable for the following components:   Total CK 29 (*)    All other components within normal limits  TROPONIN I (HIGH SENSITIVITY)  TROPONIN I (HIGH SENSITIVITY)     EKG  ED ECG REPORT I, Dionne Bucy, the attending physician, personally viewed and interpreted this ECG.  Date: 12/15/2022 EKG Time: 1215 Rate: 104 Rhythm: Sinus tachycardia QRS Axis: Left axis Intervals: normal ST/T Wave abnormalities: Nonspecific ST abnormalities inferior and lateral Narrative Interpretation: no evidence of acute ischemia; no significant change when compared to EKG of 10-24    RADIOLOGY  Chest x-ray: I independently viewed and interpreted the images; there is no focal consolidation or edema  CT angio abdomen/pelvis:  IMPRESSION:  VASCULAR    1. 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 No evidence of rupture.   2. No evidence of active bleeding.    NON-VASCULAR    1. Negative for retroperitoneal hematoma or bleeding within the  abdomen or pelvis.  2. Scattered colonic diverticula without evidence of active  diverticulitis.  3. Incidental note is made of a uterine fibroid.    PROCEDURES:  Critical Care performed: No  Procedures   MEDICATIONS ORDERED IN ED: Medications  ketorolac (TORADOL) 30 MG/ML injection 30 mg (30 mg Intravenous Given 12/15/22 1605)  iohexol (OMNIPAQUE) 350 MG/ML injection 100 mL (100 mLs Intravenous Contrast Given 12/15/22 1635)     IMPRESSION / MDM / ASSESSMENT AND PLAN / ED COURSE  I reviewed the triage vital signs and the nursing notes.  57 year old female with PMH as noted above presents with acute onset of bilateral low back pain rating to both legs with no associated neurologic symptoms.  She has no trauma.  She was recent admitted for right heart failure and started on Lasix.  Exam is reassuring with no neuro deficits, normal circulation, normal gait and ambulation.  Differential diagnosis includes, but is not limited to, muscle strain or spasm, radiculopathy, other neuropathy, other musculoskeletal back pain.  I do not suspect any specific link with her medications or her recent admission for pulmonary hypertension workup.  Differential also includes more remotely a vascular etiology although there are no findings on exam to specifically suggest this.  Initial labs are unremarkable.  CBC shows no leukocytosis.  The patient has a chronically elevated hemoglobin.  BMP is normal.  Initial troponin is negative.  We will add on a CK, repeat troponin, and CT angio of the abdomen pelvis to evaluate for vascular etiology.   Patient's presentation is most consistent with acute complicated illness / injury requiring diagnostic workup.  ----------------------------------------- 5:43 PM on 12/15/2022 -----------------------------------------  CK is normal.  Repeat  troponin is negative.  CTA shows no acute abnormalities.  On reassessment the patient has had significant improvement in her pain with the Toradol.  She is feeling much better.  She is stable for discharge home at this time.  Overall presentation is consistent with muscular pain.  The patient has been told she should no longer be on NSAIDs, so I have prescribed tramadol and Flexeril for pain control.  She will follow-up with her outpatient specialists.  Return precautions given, and she expresses understanding.   FINAL CLINICAL IMPRESSION(S) / ED DIAGNOSES   Final diagnoses:  Acute bilateral low back pain without sciatica     Rx / DC Orders   ED Discharge Orders  Ordered    traMADol (ULTRAM) 50 MG tablet  Every 8 hours PRN        12/15/22 1742    cyclobenzaprine (FLEXERIL) 10 MG tablet  3 times daily PRN        12/15/22 1742             Note:  This document was prepared using Dragon voice recognition software and may include unintentional dictation errors.    Dionne Bucy, MD 12/15/22 [phone]

## 2022-12-15 NOTE — ED Triage Notes (Signed)
Pt to ED for lower back pain radiating down bilateral legs started yesterday. Recent cardiac cath on 10/4 Reports still has some discomfort in chest +shob

## 2022-12-16 ENCOUNTER — Telehealth: Payer: Self-pay | Admitting: *Deleted

## 2022-12-16 ENCOUNTER — Encounter: Payer: Medicaid Other | Admitting: Physical Therapy

## 2022-12-16 LAB — HIGH SENSITIVITY CRP: CRP, High Sensitivity: 2.03 mg/L (ref 0.00–3.00)

## 2022-12-16 NOTE — Telephone Encounter (Signed)
Pt called to discuss ?interaction/SE with Tadalafil, this was started during recent hosp per Dr Shirlee Latch. Since then pt states her muscles (back/hips/joints) have been achy/crampy, she states she has had this for years but it has gotten significantly worse starting Tadalafil. Also reports her face is red/hot/puffy.  Pt was seen in ER yesterday for these issues and states no changes were made, she was advised to f/u with cards asap.  Discussed all with Enos Fling, Pharm D, he advised increase KCL to 40 meq daily as her level is persistently low this may help with muscle ache/cramping. Also concern of ?interaction with Minoxidil and Tadalafil, pt needs to see MD to discuss ?stopping Minoxidil.  Pt aware of above f/u/post hosp appt sch for tomorrow 10/9 with Dr Gasper Lloyd, pt agreeable.

## 2022-12-17 ENCOUNTER — Ambulatory Visit: Payer: Self-pay | Admitting: Physical Therapy

## 2022-12-17 ENCOUNTER — Encounter: Payer: Self-pay | Admitting: Nurse Practitioner

## 2022-12-17 ENCOUNTER — Ambulatory Visit
Admission: RE | Admit: 2022-12-17 | Discharge: 2022-12-17 | Disposition: A | Discharge status: Home / Self Care | Payer: Medicaid Other | Source: Ambulatory Visit | Attending: Cardiology | Admitting: Cardiology

## 2022-12-17 ENCOUNTER — Ambulatory Visit
Admission: RE | Admit: 2022-12-17 | Discharge: 2022-12-17 | Disposition: A | Discharge status: Home / Self Care | Payer: Medicaid Other | Source: Ambulatory Visit | Attending: Nurse Practitioner | Admitting: Nurse Practitioner

## 2022-12-17 ENCOUNTER — Ambulatory Visit: Payer: Medicaid Other | Admitting: Cardiology

## 2022-12-17 VITALS — BP 127/95 | HR 102 | Wt 183.0 lb

## 2022-12-17 DIAGNOSIS — L659 Nonscarring hair loss, unspecified: Secondary | ICD-10-CM | POA: Diagnosis not present

## 2022-12-17 DIAGNOSIS — I509 Heart failure, unspecified: Secondary | ICD-10-CM | POA: Diagnosis not present

## 2022-12-17 DIAGNOSIS — M797 Fibromyalgia: Secondary | ICD-10-CM | POA: Diagnosis not present

## 2022-12-17 DIAGNOSIS — E039 Hypothyroidism, unspecified: Secondary | ICD-10-CM | POA: Insufficient documentation

## 2022-12-17 DIAGNOSIS — I10 Essential (primary) hypertension: Secondary | ICD-10-CM

## 2022-12-17 DIAGNOSIS — I272 Pulmonary hypertension, unspecified: Secondary | ICD-10-CM | POA: Insufficient documentation

## 2022-12-17 DIAGNOSIS — R0602 Shortness of breath: Secondary | ICD-10-CM | POA: Insufficient documentation

## 2022-12-17 DIAGNOSIS — R079 Chest pain, unspecified: Secondary | ICD-10-CM | POA: Diagnosis not present

## 2022-12-17 DIAGNOSIS — I50810 Right heart failure, unspecified: Secondary | ICD-10-CM | POA: Insufficient documentation

## 2022-12-17 MED ORDER — METOPROLOL SUCCINATE ER 25 MG PO TB24: 25.0000 mg | ORAL_TABLET | Freq: Two times a day (BID) | ORAL | 3 refills | Status: DC

## 2022-12-17 MED ORDER — TRAMADOL HCL 50 MG PO TABS: 50.0000 mg | ORAL_TABLET | Freq: Three times a day (TID) | ORAL | 0 refills | Status: DC | PRN

## 2022-12-17 MED ORDER — CYCLOBENZAPRINE HCL 10 MG PO TABS: 10.0000 mg | ORAL_TABLET | Freq: Three times a day (TID) | ORAL | 1 refills | Status: DC | PRN

## 2022-12-17 NOTE — Progress Notes (Signed)
ADVANCED HEART FAILURE CLINIC NOTE  Referring Physician: Marjie Skiff, NP  Primary Care: Marjie Skiff, NP HF: Dr. Shirlee Latch  HPI: Tonya Odonnell is a 57 y.o. female with recently diagnosed pulmonary hypertension, hypertension, breast cancer status postmastectomy and chemotherapy in 2017 complicated by severe neuropathic/psychosomatic pain, hypothyroidism and fibromyalgia presenting today to establish care.  She was most recently seen on December 12, 2022 at [facility] when she presented with severe pain.  Prior to that she was seen by [facility] rheumatology and diagnosed with fibromyalgia.  2 months prior to her hospitalization she reports having progressive exertional dyspnea with shortness of breath just walking to her mailbox.  2 weeks prior to admission this progressed to chest tightness both at rest and with exertion.  During her hospitalization echocardiogram with EF of 50 to 55% with D-shaped interventricular septum.  She underwent right and left heart catheterization that was significant for pulmonary hypertension with a PVR of 7.3 Wood units.  She was subsequently started on tadalafil 20 mg and pH evaluation was started.   Interval hx:  Since discharge from the hospital this past weekend she reports continuing to have significant pain in all her extremities. She started tadalafil 20mg ; reports that since she started it she has had "pulling pains" in bilateral lower extremities. Due to intolerable pain she went to the ER where work up including CT imaging was negative. She reports IV pain medications helped her pain. She has had this pain since chemotherapy in 2017.  Otherwise, she does report improvement in her shortness of breath.  She is not able to walk farther without dyspnea and has had improvement in resting dyspnea.  She remains very anxious about her diagnosis and is motivated to complete pH evaluation and treatment.   Past Medical History:  Diagnosis Date   Anemia    "USE TO HAVE  IT,NOT NOW,2015,2016   Arthritis    HIPS,KNEES,HANDS   Breast cancer in female (HCC)    Cancer (HCC)    LEFT   Hyperlipidemia    Hypertension    Low iron    Neuromuscular disorder (HCC)    MUSCLE PAIN,FIBROMYALGIA   Osteopenia    PVC (premature ventricular contraction)    Thyroid disease     Current Outpatient Medications  Medication Sig Dispense Refill   ascorbic acid (VITAMIN C) 500 MG tablet Take 500 mg by mouth daily.     Azelaic Acid 15 % gel Apply to face every morning 50 g 3   cetirizine-pseudoephedrine (ZYRTEC-D) 5-120 MG tablet Take 1 tablet by mouth 2 (two) times daily. 60 tablet 4   Cholecalciferol 25 MCG (1000 UT) tablet Take 1 tablet (1,000 Units total) by mouth daily. 90 tablet 4   clobetasol cream (TEMOVATE) 0.05 % Apply 1 application topically 2 (two) times daily. Apply to affected area 30 g 2   cromolyn (OPTICROM) 4 % ophthalmic solution Place 1 drop into both eyes 4 (four) times daily. 10 mL 4   cyanocobalamin (VITAMIN B12) 1000 MCG tablet Take 1,000 mcg by mouth daily.     cyclobenzaprine (FLEXERIL) 10 MG tablet Take 1 tablet (10 mg total) by mouth 3 (three) times daily as needed for muscle spasms. 15 tablet 0   diclofenac Sodium (VOLTAREN) 1 % GEL Apply topically as needed.     diltiazem 2 % GEL Apply 1 Application topically 3 (three) times daily. Inside the rectum as directed. 30 g 0   ELIDEL 1 % cream Apply to red scaly areas on  the face BID for seb derm 30 g 4   furosemide (LASIX) 20 MG tablet Take 1 tablet (20 mg total) by mouth daily. 30 tablet 2   hydrALAZINE (APRESOLINE) 25 MG tablet Take 1 tablet (25 mg total) by mouth 2 (two) times daily. 120 tablet 1   hydrochlorothiazide (HYDRODIURIL) 12.5 MG tablet Take 12.5 mg by mouth daily.     Ivermectin (SOOLANTRA) 1 % CREA Apply 1 Application topically at bedtime. 45 g 2   levothyroxine (SYNTHROID) 112 MCG tablet Take 1 tablet (112 mcg total) by mouth daily. 90 tablet 3   metoprolol succinate (TOPROL XL) 25 MG  24 hr tablet Take 1 tablet (25 mg total) by mouth 2 (two) times daily. 180 tablet 3   minoxidil (LONITEN) 2.5 MG tablet Take 1 tablet (2.5 mg total) by mouth daily. 90 tablet 1   omeprazole (PRILOSEC) 40 MG capsule Take 1 capsule (40 mg total) by mouth daily. 30 capsule 12   potassium chloride SA (KLOR-CON M) 20 MEQ tablet Take 1 tablet (20 mEq total) by mouth daily. 30 tablet 0   tadalafil (CIALIS) 20 MG tablet Take 1 tablet (20 mg total) by mouth daily. 30 tablet 0   traMADol (ULTRAM) 50 MG tablet Take 1 tablet (50 mg total) by mouth every 8 (eight) hours as needed for up to 5 days. 15 tablet 0   valACYclovir (VALTREX) 1000 MG tablet Take 1,000 mg by mouth daily.     fluticasone (FLONASE) 50 MCG/ACT nasal spray Place 2 sprays into both nostrils daily. (Patient taking differently: Place 2 sprays into both nostrils as needed for allergies.) 16 g 2   No current facility-administered medications for this visit.    Allergies  Allergen Reactions   Lisinopril Other (See Comments)    cough cough       Social History   Socioeconomic History   Marital status: Married    Spouse name: Not on file   Number of children: Not on file   Years of education: Not on file   Highest education level: Not on file  Occupational History   Occupation: disabled  Tobacco Use   Smoking status: Never   Smokeless tobacco: Never  Vaping Use   Vaping status: Never Used  Substance and Sexual Activity   Alcohol use: Never   Drug use: Never   Sexual activity: Not Currently  Other Topics Concern   Not on file  Social History Narrative   Not on file   Social Determinants of Health   Financial Resource Strain: High Risk (05/13/2021)   Received from [facility], [facility]   Overall Financial Resource Strain (CARDIA)    Difficulty of Paying Living Expenses: Very hard  Food Insecurity: No Food Insecurity (12/11/2022)   Hunger Vital Sign    Worried About Running Out of Food in the Last Year: Never true     Ran Out of Food in the Last Year: Never true  Transportation Needs: No Transportation Needs (12/11/2022)   PRAPARE - Administrator, Civil Service (Medical): No    Lack of Transportation (Non-Medical): No  Physical Activity: Inactive (05/13/2021)   Received from [facility], [facility]   Exercise Vital Sign    Days of Exercise per Week: 0 days    Minutes of Exercise per Session: 0 min  Stress: Stress Concern Present (05/13/2021)   Received from [facility], [facility] of Occupational Health - Occupational Stress Questionnaire    Feeling  of Stress : Very much  Social Connections: Unknown (07/13/2021)   Received from [facility], [facility]   Social Network    Social Network: Not on file  Intimate Partner Violence: Not At Risk (12/11/2022)   Humiliation, Afraid, Rape, and Kick questionnaire    Fear of Current or Ex-Partner: No    Emotionally Abused: No    Physically Abused: No    Sexually Abused: No      Family History  Problem Relation Age of Onset   Hypertension Mother    Leukemia Mother    Stomach cancer Father    Colon cancer Neg Hx    Colon polyps Neg Hx    Crohn's disease Neg Hx    Esophageal cancer Neg Hx    Rectal cancer Neg Hx     PHYSICAL EXAM: Vitals:   12/17/22 1024  BP: (!) 127/95  Pulse: (!) 102  SpO2: 97%   GENERAL: Very anxious; pacing in the room HEENT: Negative for arcus senilis or xanthelasma. There is no scleral icterus.  The mucous membranes are pink and moist.   NECK: Supple, No masses. Normal carotid upstrokes without bruits. No masses or thyromegaly.    CHEST: There are no chest wall deformities. There is no chest wall tenderness. Respirations are unlabored.  Lungs-CTA bilaterally CARDIAC:  JVP: 5-7 cm H2O         Normal S1, S2 tachycardic with regular rhythm. No murmurs, rubs or gallops.  Pulses are 2+ and symmetrical in upper and lower extremities.  No edema.  ABDOMEN: Soft, non-tender,  non-distended. There are no masses or hepatomegaly. There are normal bowel sounds.  EXTREMITIES: Warm and well perfused with no cyanosis, clubbing.  LYMPHATIC: No axillary or supraclavicular lymphadenopathy.  NEUROLOGIC: Patient is oriented x3 with no focal or lateralizing neurologic deficits.  PSYCH: Patients affect is appropriate, there is no evidence of anxiety or depression.  SKIN: Warm and dry; no lesions or wounds.   DATA REVIEW  ECG: 12/16/2022: Sinus tachycardia with ST-T wave inversions in inferior lateral leads as per my personal interpretation  ECHO: 12/12/22: LVEF 50 to 55%, low normal RV function.  D-shaped interventricular septum.  As per my personal interpretation  CATH: 12/12/22: 1.  Normal coronary arteries. 2.  Left ventricular angiography was not performed.  EF was low normal by echo.  Normal left ventricular end-diastolic pressure. 3.  Right heart catheterization showed mildly elevated RA pressure, severe pulmonary hypertension, normal wedge pressure and normal cardiac output.   RA: 11 mmHg RV: 78/6 mmHg PW: 10 mmHg PA: 76/38 with a mean of 52 mmHg.  Pulmonary vascular resistance is 7.3 Woods units Cardiac output: 5.7 with an index of 3.04.   ASSESSMENT & PLAN:  Pulmonary Hypertension - Negative CTA PE  on 12/15/22 negative for acute PE; will require V/Q scan.  - Seen by rheumatology at [facility]; no diagnosis of autoimmune process; diagnosed with fibromyalgia.  - Negative anti-SCL, ANA with normal hsCRP and ESR.  - RHC on 12/12/22 w/ RA 11, PA 76/38 (52), PCW 10; PVR 7.3WU w/ CI of 3 L/min/m2.  - I suspect she has some combination of Group I + III PH (sleep apnea). Will order PFTs & V/Q scan today. Can start ERA pending above work up.  - Personally reviewed CT imaging; no significant parenchymal lung disease.  - Continue tadalafil 20mg  TID. Will hold off on adding 2nd line agent at this time due to her symptoms and need for further work up.  - decrease  metoprolol to  25mg  BID; she has no indication for beta blockers; will discontinue at follow up.  - reviewed labs from 12/15/22; normal sCr, CBC and hsTrop.  - discussed with pharmD today. Follow up in 1 month.   2. Hypertension - Very anxious today; SBP 120s.  - Decrease metoprolol to 25mg  BID. Very averse to med changes. Can transition to amlodipine or other agent at follow up and D/C toprol.   3. Hypothyroidism - TSH well controlled (1.58 on 10/29/22) - followed by PCP.  Hx of Breast cancer - s/p mastectomy.  - completed chemotherapy in 2017; reports having significant pain afterwards.   4. Diffuse pain / fibromyalgia - Reports having diffuse pain since completion of chemotherapy in 2017. I do not believe her current LE pain is secondary to tadalafil; appears neuropathic / psychosomatic in nature. Will continue tadalafil for the time being. Work up in ER also negative.   5. Hair loss - D/C minoxidil; potential side effects with PH therapy.   >40 minutes spent reviewing and discussing above.   Gavyn Ybarra Advanced Heart Failure Mechanical Circulatory Support

## 2022-12-17 NOTE — Progress Notes (Signed)
Height:  5'3"   Weight: 183lb BMI: 32  Today's Date: 12/17/22  STOP BANG RISK ASSESSMENT S (snore) Have you been told that you snore?     YES   T (tired) Are you often tired, fatigued, or sleepy during the day?   YES  O (obstruction) Do you stop breathing, choke, or gasp during sleep? YES   P (pressure) Do you have or are you being treated for high blood pressure? YES   B (BMI) Is your body index greater than 35 kg/m? NO   A (age) Are you 57 years old or older? YES   N (neck) Do you have a neck circumference greater than 16 inches?   NO   G (gender) Are you a female? NO   TOTAL STOP/BANG "YES" ANSWERS 5                                                                       For Office Use Only              Procedure Order Form    YES to 3+ Stop Bang questions OR two clinical symptoms - patient qualifies for WatchPAT (CPT 95800)      Clinical Notes: Will consult Sleep Specialist and refer for management of therapy due to patient increased risk of Sleep Apnea. Ordering a sleep study due to the following two clinical symptoms: Excessive daytime sleepiness G47.10 /  / Loud snoring R06.83 / Depression F32.9 / Unrefreshed by sleep G47.8 /  / History of high blood pressure R03.0 / Insomnia G47.00

## 2022-12-17 NOTE — Patient Instructions (Signed)
Medication Changes:  Decrease Toprol to 25 mg (1 tablet) 2 times daily.     Testing/Procedures:  A chest x-ray takes a picture of the organs and structures inside the chest, including the heart, lungs, and blood vessels. This test can show several things, including, whether the heart is enlarges; whether fluid is building up in the lungs; and whether pacemaker / defibrillator leads are still in place.  Please call [phone] to schedule your VQ scan.   You will have a Pulmonary Function Test at [facility]. Pl;ease call this number to schedule this appointment. [phone]   Your provider has recommended that you have a home sleep study ([facility] Test).  We have provided you with the equipment in our office today. Please go ahead and download the app. DO NOT OPEN OR TAMPER WITH THE BOX UNTIL WE ADVISE YOU TO DO SO. Once insurance has approved the test our office will call you with PIN number and approval to proceed with testing. Once you have completed the test you just dispose of the equipment, the information is automatically uploaded to [place] via blue-tooth technology. If your test is positive for sleep apnea and you need a home CPAP machine you will be contacted by Dr Norris Cross office [facility]) to set this up.   Special Instructions // Education:  Do the following things EVERYDAY: Weigh yourself in the morning before breakfast. Write it down and keep it in a log. Take your medicines as prescribed Eat low salt foods--Limit salt (sodium) to 2000 mg per day.  Stay as active as you can everyday Limit all fluids for the day to less than 2 liters   Follow-Up in: please follow up in 1 month with Dr. Shirlee Latch.    If you have any questions or concerns before your next appointment please send [place] a message through [place] or call our office at [phone] Monday-Friday 8 am-5 pm.   If you have an urgent need after hours on the weekend please call your Primary Cardiologist or the  Advanced Heart Failure Clinic in [place] at [phone].   At the Advanced Heart Failure Clinic, you and your health needs are our priority. We have a designated team specialized in the treatment of Heart Failure. This Care Team includes your primary Heart Failure Specialized Cardiologist (physician), Advanced Practice Providers (APPs- Physician Assistants and Nurse Practitioners), and Pharmacist who all work together to provide you with the care you need, when you need it.   You may see any of the following providers on your designated Care Team at your next follow up:  Dr. Arvilla Meres Dr. Marca Ancona Dr. Dorthula Nettles Dr. Theresia Bough Tonye Becket, NP Robbie Lis, [place] [address] [place], [place] Brynda Peon, NP [place] Lee, NP Clarisa Kindred, NP Enos Fling, PharmD

## 2022-12-18 ENCOUNTER — Encounter: Payer: Self-pay | Admitting: Nurse Practitioner

## 2022-12-18 DIAGNOSIS — I272 Pulmonary hypertension, unspecified: Secondary | ICD-10-CM

## 2022-12-18 DIAGNOSIS — I50810 Right heart failure, unspecified: Secondary | ICD-10-CM

## 2022-12-19 ENCOUNTER — Other Ambulatory Visit (HOSPITAL_COMMUNITY): Payer: Self-pay | Admitting: *Deleted

## 2022-12-19 ENCOUNTER — Telehealth: Payer: Self-pay | Admitting: Pharmacist

## 2022-12-19 DIAGNOSIS — I272 Pulmonary hypertension, unspecified: Secondary | ICD-10-CM

## 2022-12-19 NOTE — Addendum Note (Signed)
Addended by: Aura Dials T on: 12/19/2022 05:44 PM   Modules accepted: Orders

## 2022-12-21 NOTE — Patient Instructions (Signed)
Pulmonary Hypertension Pulmonary hypertension is a condition that causes high blood pressure in the blood vessels of your lungs. This makes your heart work extra hard to pump blood to your lungs. And when your heart has to work harder, it can be harder for you to breathe. Over time, this can hurt and weaken your heart. What are the causes? This condition can be put into one of five groups based on what causes it. Group 1 happens when the blood vessels that carry blood to your lungs get too thick or stiff. This may happen with no known cause or it may be: Inherited. This means it's passed from parent to child. Caused by another disease, such as a disease of the heart or liver. Caused by some medicines or poisons. Group 2 happens if you have problems with the valves in your heart or if the left side of your heart, also called your left ventricle, gets weak. Group 3 can be caused by long-term diseases of the lungs, such as chronic obstructive pulmonary disease or COPD. This can also happen if you don't get enough oxygen, such as if you have trouble breathing when you sleep or if you live at a high altitude. Group 4 is caused by blood clots in your lungs. Group 5 includes other causes, such as sickle cell anemia or growths of cells that aren't normal called tumors. What are the signs or symptoms? Symptoms may include: Shortness of breath. A cough. Tiredness. Feeling dizzy or light-headed. You may also faint. A fast heart beat. You may also feel your heart flutter or skip a beat. Your lips or fingers turning blue. Chest pain or tightness. How is this diagnosed? This condition may be diagnosed with: Blood tests. Imaging tests. These may include: Chest X-rays. CT scans. An echocardiogram. This is an ultrasound of your heart. A ventilation-perfusion scan. This sees how well air and blood flow in and out of your lungs. A pulmonary function test. This looks at how much air your lungs can  hold. A 6-minute walk test. This may be done to check your breathing during exercise. Cardiac catheterization. This is a procedure that uses a soft tube called a catheter to check the arteries of your heart. A lung biopsy. This is when a small piece of tissue is removed from your lung for testing. How is this treated? There's no cure for this condition. But treatment can help you feel better and can slow the condition down. You may need: Oxygen therapy. Cardiac rehabilitation, or rehab. This is a program that teaches you how to: Care for your heart. Exercise. Go back to your normal activities. Medicines to: Lower your blood pressure. Relax the blood vessels in your lungs. Help your heart pump more blood. Help your body get rid of extra fluid. Thin your blood. This can stop you from getting blood clots. Lung surgery. This can relieve pressure on your heart. You may need this if other treatments don't work. Heart-lung or lung transplant. This may be done in very severe cases. Follow these instructions at home: Eating and drinking  Eat a healthy diet. Eat lots of fresh fruits and vegetables, whole grains, and beans. Limit how much salt you take in to less than 2,300 mg a day. Salt is also called sodium. Activity Get lots of rest. Exercise as told. Ask your health care provider what types of exercise are safe for you. Lifestyle Do not use any products that contain nicotine or tobacco. These products include cigarettes, chewing  tobacco, and vaping devices, such as e-cigarettes. If you need help quitting, ask your provider. Stay away when other people smoke. Do not sit in hot tubs or saunas for long stretches of time. Do not get pregnant. If needed, talk with your provider about birth control. Avoid high altitudes. It can be stressful to live with pulmonary hypertension. Talk with your provider about finding support groups and online help. General instructions Take over-the-counter and  prescription medicines only as told by your provider. Do not change or stop medicines without talking with your provider. Stay up to date on your shots, such as your yearly flu shot and pneumonia shot. Use oxygen therapy at home as told. Keep all follow-up visits. Your provider will check your breathing and make changes to your medicines as needed. Contact a health care provider if: Your cough gets worse. You have more shortness of breath. You start to have trouble doing things you could do before. You need to use more medicines or oxygen, or you need to use them more often than normal. Get help right away if: You have severe shortness of breath. You have chest pain or pressure. You cough up blood. These symptoms may be an emergency. Get help right away. Call 911. Do not wait to see if the symptoms will go away. Do not drive yourself to the hospital. This information is not intended to replace advice given to you by your health care provider. Make sure you discuss any questions you have with your health care provider. Document Revised: 05/12/2022 Document Reviewed: 05/12/2022 Elsevier Patient Education  2024 [employer].

## 2022-12-22 ENCOUNTER — Ambulatory Visit: Payer: Medicaid Other | Admitting: Nurse Practitioner

## 2022-12-22 ENCOUNTER — Encounter: Payer: Self-pay | Admitting: Nurse Practitioner

## 2022-12-22 VITALS — BP 123/91 | HR 111 | Temp 97.5°F | Ht 63.0 in | Wt 182.0 lb

## 2022-12-22 DIAGNOSIS — R7303 Prediabetes: Secondary | ICD-10-CM | POA: Diagnosis not present

## 2022-12-22 DIAGNOSIS — E66811 Obesity, class 1: Secondary | ICD-10-CM | POA: Diagnosis not present

## 2022-12-22 DIAGNOSIS — Z6832 Body mass index (BMI) 32.0-32.9, adult: Secondary | ICD-10-CM | POA: Diagnosis not present

## 2022-12-22 DIAGNOSIS — E063 Autoimmune thyroiditis: Secondary | ICD-10-CM | POA: Diagnosis not present

## 2022-12-22 DIAGNOSIS — I50810 Right heart failure, unspecified: Secondary | ICD-10-CM

## 2022-12-22 DIAGNOSIS — D582 Other hemoglobinopathies: Secondary | ICD-10-CM

## 2022-12-22 DIAGNOSIS — I200 Unstable angina: Secondary | ICD-10-CM | POA: Diagnosis not present

## 2022-12-22 DIAGNOSIS — E6609 Other obesity due to excess calories: Secondary | ICD-10-CM

## 2022-12-22 DIAGNOSIS — I272 Pulmonary hypertension, unspecified: Secondary | ICD-10-CM | POA: Diagnosis not present

## 2022-12-22 DIAGNOSIS — E782 Mixed hyperlipidemia: Secondary | ICD-10-CM | POA: Diagnosis not present

## 2022-12-22 DIAGNOSIS — F419 Anxiety disorder, unspecified: Secondary | ICD-10-CM | POA: Diagnosis not present

## 2022-12-22 DIAGNOSIS — I10 Essential (primary) hypertension: Secondary | ICD-10-CM | POA: Diagnosis not present

## 2022-12-22 MED ORDER — TADALAFIL 20 MG PO TABS: 20.0000 mg | ORAL_TABLET | Freq: Every day | ORAL | 4 refills | Status: DC

## 2022-12-22 MED ORDER — FUROSEMIDE 20 MG PO TABS: 20.0000 mg | ORAL_TABLET | Freq: Every day | ORAL | 2 refills | Status: DC

## 2022-12-22 MED ORDER — POTASSIUM CHLORIDE CRYS ER 20 MEQ PO TBCR: 20.0000 meq | EXTENDED_RELEASE_TABLET | Freq: Every day | ORAL | 0 refills | Status: DC

## 2022-12-22 NOTE — Assessment & Plan Note (Signed)
BMI 32.24.  Recommended eating smaller high protein, low fat meals more frequently and exercising 30 mins a day 5 times a week with a goal of 10-15lb weight loss in the next 3 months. Patient voiced their understanding and motivation to adhere to these recommendations.

## 2022-12-22 NOTE — Assessment & Plan Note (Signed)
Chronic, ongoing.  Recommend continued focus on diet and regular activity. Lipid panel up to date, discussed with her possible statin initiation in upcoming months due to new diagnoses.   The 10-year ASCVD risk score (Arnett DK, et al., 2019) is: 2.7%   Values used to calculate the score:     Age: 57 years     Sex: Female     Is Non-Hispanic African American: No     Diabetic: No     Tobacco smoker: No     Systolic Blood Pressure: 123 mmHg     Is BP treated: Yes     HDL Cholesterol: 65 mg/dL     Total Cholesterol: 226 mg/dL

## 2022-12-22 NOTE — Assessment & Plan Note (Signed)
New diagnosis October 2024.  Is very anxious about this, discussed diagnosis at length with her today and offered support.  Will continue Cialis as ordered, refills sent and will work on coverage for this.  Referral to [facility] pharmacist to assist.  Placed referral to local pulmonary hypertension specialist at [facility], urgent referral.  She also has referral to her preferred cardiologist at [facility], as they speak [place].

## 2022-12-22 NOTE — Assessment & Plan Note (Signed)
Noted on past labs, check level today and recommend diet focus.  Level 5.7% last visit, highly recommend diet and exercise focus.

## 2022-12-22 NOTE — Assessment & Plan Note (Signed)
New diagnosis October 2024 with underlying pulmonary hypertension.  Discussed diagnosis at length with her today and offered support.  Will continue Cialis as ordered, refills sent and will work on coverage for this.  Referral to [facility] pharmacist to assist.  Placed referral to local pulmonary hypertension specialist at [facility], urgent referral.  She also has referral to her preferred cardiologist at [facility], as they speak [place].

## 2022-12-22 NOTE — Assessment & Plan Note (Signed)
Stable at present with no chest pain.  Has a urgent referral in place for cardiology of her preference at [facility] who speaks [place].

## 2022-12-22 NOTE — Assessment & Plan Note (Signed)
Chronic, ongoing.  BP slightly elevated diastolic today, but is very anxious.  Recommend she monitor BP at least a few mornings a week at home and document.  DASH diet at home.  Continue current medication regimen.  Labs: CBC, BNP, A1c, CMP.  Urine ALB 80 April 2024.  Has a referral in place for cardiology.

## 2022-12-22 NOTE — Assessment & Plan Note (Signed)
Exacerbated at this time due to new pulmonary hypertension diagnosis.  Currently no medication, does not wish to start at this time.  May benefit talk therapy in future to assist with this transition.

## 2022-12-22 NOTE — Progress Notes (Signed)
BP (!) 123/91 (BP Location: Left Arm, Patient Position: Sitting, Cuff Size: Large)   Pulse (!) 111   Temp (!) 97.5 F (36.4 C) (Oral)   Ht 5\' 3"  (1.6 m)   Wt 182 lb (82.6 kg)   LMP  (LMP Unknown)   SpO2 94%   BMI 32.24 kg/m    Subjective:    Patient ID: Tonya Odonnell, female    DOB: May 31, 1965, 57 y.o.   MRN: [id number]  HPI: Tonya Odonnell is a 58 y.o. female  Chief Complaint  Patient presents with   Medical Management of Chronic Issues    Follow up after hospital visit for chest pain , heart failure, also to check thyroid, would like to discuss Cialis due to insurance not covering it unless she has PA    Transition of Care Hospital Follow up.  Admitted to hospital on 12/10/22 to 12/13/22, was diagnosed with pulmonary hypertension and right-sided heart failure.  Started on Lasix 20 MG daily and Cialis 20 MG daily -- she reports insurance is not covering this -- has a month supply, but will be $500 a month after this. Was given potassium to take daily on discharge due to low levels in hospital.  She is to have sleep study, but insurance needs to authorize.  This was ordered in hospital but she is frustrated that has not had it yet.  Is very fearful of new diagnosis.  She reports the symptoms she has talked about for years are now better with Cialis on board.  Has history of breast cancer with chemo and radiation treatment.  Returned to ER on 12/15/22 for back pain - given Tramadol and Flexeril.    "Tonya Odonnell is a 57 y.o. Caucasian female with medical history significant for breast cancer s/p mastectomy, hypertension, dyslipidemia and hypothyroidism, who presented to emergency room with acute onset of dyspnea on exertion as well as intermittent chest pain felt as pressure over the last couple of weeks worsening with exertion as well as generalized weakness.  She was recently given diuretic therapy for lower extremity edema.  He describes the chest pain location: Dull aching substernal chest  pressure.  She admits to dry cough without wheezing or hemoptysis.  Admits pain after she had exercise, pain more with moving her extremities, also has chest wall tenderness. She is admitted for chest pain, sob with exertion for cardiac evaluation. During hospital stay, patient is seen by cardiologist who recommended left and right heart cath.  She had CTA chest which showed no acute pulmonary emboli.  Echocardiogram showed concerning findings for pulmonary hypertension.  Right and left heart cath done showed no CAD, elevated right heart pressures.  Heart failure team evaluated her advised VQ scan so CTA chest negative for PE, outpatient sleep study and rheumatologist autoimmune workup for Group 1 pulmonary hypertension.  Patient is started on Lasix 20 mg, tadalafil 20 mg, potassium supplementation.  I advised her to follow-up with PCP for further management, heart failure clinic follow-up.  Diet, exercise, weight reduction, CHF instructions provided.  Patient understands and agrees with the discharge plan.   Assessment and Plan: Chest pain/ dyspnea on exertion Pulmonary hypertension/right-sided heart failure- Still has chest pain on and off. BNP high. EKG with T wave inversions Echo concerning for pulmo hypertension Cardiology follow up appreciated recommended right and left heart cath which showed no CAD elevated pulmonary artery pressures.  Heart failure team evaluated her recommended Lasix 20 mg daily, tadalafil 20 mg daily and outpatient follow-up. Sleep  study and nuclear medicine VQ scan suggested as outpatient. Patient will also need a rheumatology follow-up in light of Group 1 pulmonary hypertension for autoimmune workup.   Hypokalemia Daily supplements with diuresis prescribed   Hypertensive urgency BP better.  Continue metoprolol, minoxidil, hydrochlorothiazide, hydralazine.   Migraine headache- fioricet prn.   GERD without esophagitis Continue PPI therapy.    Hypothyroidism Continue Synthroid.   Obesity with BMI 32.9 Diet, exercise and weight reduction advised. Encouraged life style modification."  Hospital/Facility: [facility] D/C Physician: Dr. Lynne Logan D/C Date: 12/13/22  Records Requested:  Records Received:  Records Reviewed:   Diagnoses on Discharge: Chest Pain  Date of interactive Contact within 48 hours of discharge:  Contact was through: none  Date of 7 day or 14 day face-to-face visit:    within 14 days  Outpatient Encounter Medications as of 12/22/2022  Medication Sig   ascorbic acid (VITAMIN C) 500 MG tablet Take 500 mg by mouth daily.   Azelaic Acid 15 % gel Apply to face every morning   cetirizine-pseudoephedrine (ZYRTEC-D) 5-120 MG tablet Take 1 tablet by mouth 2 (two) times daily.   Cholecalciferol 25 MCG (1000 UT) tablet Take 1 tablet (1,000 Units total) by mouth daily.   clobetasol cream (TEMOVATE) 0.05 % Apply 1 application topically 2 (two) times daily. Apply to affected area   cromolyn (OPTICROM) 4 % ophthalmic solution Place 1 drop into both eyes 4 (four) times daily.   cyanocobalamin (VITAMIN B12) 1000 MCG tablet Take 1,000 mcg by mouth daily.   cyclobenzaprine (FLEXERIL) 10 MG tablet Take 1 tablet (10 mg total) by mouth 3 (three) times daily as needed for muscle spasms.   diclofenac Sodium (VOLTAREN) 1 % GEL Apply topically as needed.   diltiazem 2 % GEL Apply 1 Application topically 3 (three) times daily. Inside the rectum as directed.   ELIDEL 1 % cream Apply to red scaly areas on the face BID for seb derm   hydrALAZINE (APRESOLINE) 25 MG tablet Take 1 tablet (25 mg total) by mouth 2 (two) times daily.   hydrochlorothiazide (HYDRODIURIL) 12.5 MG tablet Take 12.5 mg by mouth daily.   Ivermectin (SOOLANTRA) 1 % CREA Apply 1 Application topically at bedtime.   levothyroxine (SYNTHROID) 112 MCG tablet Take 1 tablet (112 mcg total) by mouth daily.   metoprolol succinate (TOPROL XL) 25 MG 24 hr tablet Take 1  tablet (25 mg total) by mouth 2 (two) times daily.   omeprazole (PRILOSEC) 40 MG capsule Take 1 capsule (40 mg total) by mouth daily.   traMADol (ULTRAM) 50 MG tablet Take 50 mg by mouth every 8 (eight) hours as needed.   valACYclovir (VALTREX) 1000 MG tablet Take 1,000 mg by mouth daily.   [DISCONTINUED] furosemide (LASIX) 20 MG tablet Take 1 tablet (20 mg total) by mouth daily.   [DISCONTINUED] minoxidil (LONITEN) 2.5 MG tablet Take 1 tablet (2.5 mg total) by mouth daily.   [DISCONTINUED] potassium chloride SA (KLOR-CON M) 20 MEQ tablet Take 1 tablet (20 mEq total) by mouth daily.   [DISCONTINUED] tadalafil (CIALIS) 20 MG tablet Take 1 tablet (20 mg total) by mouth daily.   fluticasone (FLONASE) 50 MCG/ACT nasal spray Place 2 sprays into both nostrils daily. (Patient taking differently: Place 2 sprays into both nostrils as needed for allergies.)   furosemide (LASIX) 20 MG tablet Take 1 tablet (20 mg total) by mouth daily.   potassium chloride SA (KLOR-CON M) 20 MEQ tablet Take 1 tablet (20 mEq total) by mouth daily.  tadalafil (CIALIS) 20 MG tablet Take 1 tablet (20 mg total) by mouth daily.   [DISCONTINUED] traMADol (ULTRAM) 50 MG tablet Take 1 tablet (50 mg total) by mouth every 8 (eight) hours as needed for up to 5 days.   No facility-administered encounter medications on file as of 12/22/2022.    Diagnostic Tests Reviewed/Disposition:     Latest Ref Rng & Units 12/15/2022   12:14 PM 12/13/2022    4:56 AM 12/12/2022    3:34 PM  CMP  Glucose 70 - 99 mg/dL 119  147    BUN 6 - 20 mg/dL 22  26    Creatinine 8.29 - 1.00 mg/dL 5.62  1.30    Sodium 865 - 145 mmol/L 136  135  138   Potassium 3.5 - 5.1 mmol/L 3.5  3.3  3.4   Chloride 98 - 111 mmol/L 100  103    CO2 22 - 32 mmol/L 24  21    Calcium 8.9 - 10.3 mg/dL 9.8  9.7         Latest Ref Rng & Units 12/15/2022   12:14 PM 12/13/2022    4:56 AM 12/12/2022    3:34 PM  CBC  WBC 4.0 - 10.5 K/uL 7.4  9.2    Hemoglobin 12.0 - 15.0 g/dL  78.4  69.6  29.5   Hematocrit 36.0 - 46.0 % 53.5  50.6  51.0   Platelets 150 - 400 K/uL 348  317      Consults: Cardiology  Discharge Instructions:  PCP follow up in 1 week Heart failure clinic follow up as scheduled. Outpatient nuclear V/Q scan, sleep study suggested. Outpatient rheumatology work up suggested for autoimmune disorder workup of Group 1 pulmonary hypertension.  Disease/illness Education: Provided at length education today, patient very scared with new diagnosis and has been Googling, told her to avoid [employer].  Home Health/Community Services Discussions/Referrals: none  Establishment or re-establishment of referral orders for community resources: none  Discussion with other health care providers: Reviewed all recent notes  Assessment and Support of treatment regimen adherence: Reviewed with patient at length  Appointments Coordinated with: Reviewed with patient at length  Education for self-management, independent living, and ADLs:  Reviewed with patient at length     12/10/2022    2:02 PM 12/10/2022    1:59 PM 06/17/2021   11:05 AM 06/13/2021   11:10 AM 06/03/2021   11:17 AM  Depression screen PHQ 2/9  Decreased Interest 1 1 0 0 0  Down, Depressed, Hopeless 1 1 0 0 0  PHQ - 2 Score 2 2 0 0 0  Altered sleeping 3 3 2     Tired, decreased energy 3 3 3     Change in appetite 1 2 0    Feeling bad or failure about yourself  -- 0 1    Trouble concentrating 0 0 1    Moving slowly or fidgety/restless 0 1 0    Suicidal thoughts -- 0 0    PHQ-9 Score 9 11 7     Difficult doing work/chores Very difficult Very difficult          12/10/2022    2:04 PM 06/17/2021   11:05 AM  GAD 7 : Generalized Anxiety Score  Nervous, Anxious, on Edge 0 0  Control/stop worrying 1 0  Worry too much - different things 1 1  Trouble relaxing 0 2  Restless 0 0  Easily annoyed or irritable 0 0  Afraid - awful might happen 1 0  Total GAD 7 Score 3 3  Anxiety Difficulty Somewhat difficult  Somewhat difficult   Relevant past medical, surgical, family and social history reviewed and updated as indicated. Interim medical history since our last visit reviewed. Allergies and medications reviewed and updated.  Review of Systems  Constitutional:  Negative for activity change, appetite change, diaphoresis, fatigue and fever.  Respiratory:  Positive for shortness of breath. Negative for cough, chest tightness and wheezing.   Cardiovascular:  Negative for chest pain, palpitations and leg swelling.  Gastrointestinal: Negative.   Endocrine: Negative.   Neurological: Negative.   Psychiatric/Behavioral: Negative.      Per HPI unless specifically indicated above     Objective:    BP (!) 123/91 (BP Location: Left Arm, Patient Position: Sitting, Cuff Size: Large)   Pulse (!) 111   Temp (!) 97.5 F (36.4 C) (Oral)   Ht 5\' 3"  (1.6 m)   Wt 182 lb (82.6 kg)   LMP  (LMP Unknown)   SpO2 94%   BMI 32.24 kg/m   Wt Readings from Last 3 Encounters:  12/22/22 182 lb (82.6 kg)  12/17/22 183 lb (83 kg)  12/15/22 188 lb (85.3 kg)    Physical Exam Vitals and nursing note reviewed.  Constitutional:      General: She is awake. She is not in acute distress.    Appearance: Normal appearance. She is well-developed and well-groomed. She is obese. She is not ill-appearing or toxic-appearing.  HENT:     Head: Normocephalic.     Comments: Face slightly swollen.  Very tearful.     Right Ear: Hearing and external ear normal.     Left Ear: Hearing and external ear normal.     Nose: Nose normal.     Mouth/Throat:     Lips: Pink.     Mouth: Mucous membranes are moist.  Eyes:     General: Lids are normal.        Right eye: No discharge.        Left eye: No discharge.     Conjunctiva/sclera: Conjunctivae normal.     Pupils: Pupils are equal, round, and reactive to light.  Neck:     Thyroid: No thyromegaly.     Vascular: No carotid bruit.  Cardiovascular:     Rate and Rhythm: Normal rate and  regular rhythm.     Heart sounds: Normal heart sounds. No murmur heard.    No gallop.  Pulmonary:     Effort: Pulmonary effort is normal. No accessory muscle usage or respiratory distress.     Breath sounds: Normal breath sounds.  Abdominal:     General: Bowel sounds are normal. There is no distension.     Palpations: Abdomen is soft.     Tenderness: There is no abdominal tenderness.  Musculoskeletal:     Cervical back: Normal range of motion and neck supple.     Right lower leg: No edema.     Left lower leg: No edema.  Lymphadenopathy:     Cervical: No cervical adenopathy.  Skin:    General: Skin is warm and dry.  Neurological:     Mental Status: She is alert and oriented to person, place, and time.     Deep Tendon Reflexes: Reflexes are normal and symmetric.     Reflex Scores:      Brachioradialis reflexes are 2+ on the right side and 2+ on the left side.      Patellar reflexes are 2+ on the right  side and 2+ on the left side. Psychiatric:        Attention and Perception: Attention normal.        Mood and Affect: Mood is anxious. Affect is tearful.        Speech: Speech normal.        Behavior: Behavior normal. Behavior is cooperative.        Thought Content: Thought content normal.    Results for orders placed or performed during the hospital encounter of 12/15/22  Basic metabolic panel  Result Value Ref Range   Sodium 136 135 - 145 mmol/L   Potassium 3.5 3.5 - 5.1 mmol/L   Chloride 100 98 - 111 mmol/L   CO2 24 22 - 32 mmol/L   Glucose, Bld 157 (H) 70 - 99 mg/dL   BUN 22 (H) 6 - 20 mg/dL   Creatinine, Ser 7.82 0.44 - 1.00 mg/dL   Calcium 9.8 8.9 - 95.6 mg/dL   GFR, Estimated >21 >30 mL/min   Anion gap 12 5 - 15  CBC  Result Value Ref Range   WBC 7.4 4.0 - 10.5 K/uL   RBC 6.25 (H) 3.87 - 5.11 MIL/uL   Hemoglobin 17.9 (H) 12.0 - 15.0 g/dL   HCT 86.5 (H) 78.4 - 69.6 %   MCV 85.6 80.0 - 100.0 fL   MCH 28.6 26.0 - 34.0 pg   MCHC 33.5 30.0 - 36.0 g/dL   RDW 29.5  28.4 - 13.2 %   Platelets 348 150 - 400 K/uL   nRBC 0.0 0.0 - 0.2 %  CK  Result Value Ref Range   Total CK 29 (L) 38 - 234 U/L  Troponin I (High Sensitivity)  Result Value Ref Range   Troponin I (High Sensitivity) 7 <18 ng/L  Troponin I (High Sensitivity)  Result Value Ref Range   Troponin I (High Sensitivity) 8 <18 ng/L      Assessment & Plan:   Problem List Items Addressed This Visit       Cardiovascular and Mediastinum   Essential (primary) hypertension    Chronic, ongoing.  BP slightly elevated diastolic today, but is very anxious.  Recommend she monitor BP at least a few mornings a week at home and document.  DASH diet at home.  Continue current medication regimen.  Labs: CBC, BNP, A1c, CMP.  Urine ALB 80 April 2024.  Has a referral in place for cardiology.      Relevant Medications   tadalafil (CIALIS) 20 MG tablet   furosemide (LASIX) 20 MG tablet   Other Relevant Orders   Basic metabolic panel   Pulmonary hypertension, unspecified [facility]) - Primary    New diagnosis October 2024.  Is very anxious about this, discussed diagnosis at length with her today and offered support.  Will continue Cialis as ordered, refills sent and will work on coverage for this.  Referral to [facility] pharmacist to assist.  Placed referral to local pulmonary hypertension specialist at [facility], urgent referral.  She also has referral to her preferred cardiologist at [facility], as they speak [place].      Relevant Medications   tadalafil (CIALIS) 20 MG tablet   furosemide (LASIX) 20 MG tablet   Other Relevant Orders   Basic metabolic panel   B Nat Peptide   Ambulatory referral to Pulmonology   Amb Referral to Clinical Pharmacist   Right ventricular failure [facility])    New diagnosis October 2024 with underlying pulmonary hypertension.  Discussed diagnosis at length with her  today and offered support.  Will continue Cialis as ordered, refills sent and will work on coverage for this.  Referral to [facility]  pharmacist to assist.  Placed referral to local pulmonary hypertension specialist at [facility], urgent referral.  She also has referral to her preferred cardiologist at [facility], as they speak [place].      Relevant Medications   tadalafil (CIALIS) 20 MG tablet   furosemide (LASIX) 20 MG tablet   Other Relevant Orders   Basic metabolic panel   B Nat Peptide   Ambulatory referral to Pulmonology   Amb Referral to Clinical Pharmacist   Unstable angina (HCC)    Stable at present with no chest pain.  Has a urgent referral in place for cardiology of her preference at [facility] who speaks [place].      Relevant Medications   tadalafil (CIALIS) 20 MG tablet   furosemide (LASIX) 20 MG tablet     Endocrine   Hashimoto's thyroiditis    Chronic, ongoing. Continue current medication regimen and adjust as needed based on labs.  Recheck of labs today.      Relevant Orders   T4, free   TSH     Other   Anxiety    Exacerbated at this time due to new pulmonary hypertension diagnosis.  Currently no medication, does not wish to start at this time.  May benefit talk therapy in future to assist with this transition.      Hyperlipidemia    Chronic, ongoing.  Recommend continued focus on diet and regular activity. Lipid panel up to date, discussed with her possible statin initiation in upcoming months due to new diagnoses.   The 10-year ASCVD risk score (Arnett DK, et al., 2019) is: 2.7%   Values used to calculate the score:     Age: 64 years     Sex: Female     Is Non-Hispanic African American: No     Diabetic: No     Tobacco smoker: No     Systolic Blood Pressure: 123 mmHg     Is BP treated: Yes     HDL Cholesterol: 65 mg/dL     Total Cholesterol: 226 mg/dL       Relevant Medications   tadalafil (CIALIS) 20 MG tablet   furosemide (LASIX) 20 MG tablet   Obesity    BMI 32.24.  Recommended eating smaller high protein, low fat meals more frequently and exercising 30 mins a day 5 times a week  with a goal of 10-15lb weight loss in the next 3 months. Patient voiced their understanding and motivation to adhere to these recommendations.      Prediabetes    Noted on past labs, check level today and recommend diet focus.  Level 5.7% last visit, highly recommend diet and exercise focus.      Relevant Orders   HgB A1c   Other Visit Diagnoses     Elevated hemoglobin (HCC)       Noted on recent labs, recheck today and if ongoing consider hematology referral.   Relevant Orders   CBC with Differential/Platelet   Iron Binding Cap (TIBC)([facility])   Ferritin        Follow up plan: Return in about 4 weeks (around 01/19/2023) for Pulmonary Hypertension.

## 2022-12-22 NOTE — Assessment & Plan Note (Signed)
Chronic, ongoing. Continue current medication regimen and adjust as needed based on labs.  Recheck of labs today.

## 2022-12-23 ENCOUNTER — Ambulatory Visit
Admission: RE | Admit: 2022-12-23 | Discharge: 2022-12-23 | Disposition: A | Discharge status: Home / Self Care | Payer: Medicaid Other | Source: Ambulatory Visit | Attending: Cardiology | Admitting: Cardiology

## 2022-12-23 ENCOUNTER — Encounter: Payer: Self-pay | Admitting: Cardiology

## 2022-12-23 ENCOUNTER — Telehealth: Payer: Self-pay | Admitting: *Deleted

## 2022-12-23 ENCOUNTER — Encounter: Payer: Medicaid Other | Admitting: Cardiology

## 2022-12-23 DIAGNOSIS — I272 Pulmonary hypertension, unspecified: Secondary | ICD-10-CM

## 2022-12-23 DIAGNOSIS — I509 Heart failure, unspecified: Secondary | ICD-10-CM | POA: Insufficient documentation

## 2022-12-23 DIAGNOSIS — Z7689 Persons encountering health services in other specified circumstances: Secondary | ICD-10-CM | POA: Diagnosis not present

## 2022-12-23 MED ORDER — TECHNETIUM TO 99M ALBUMIN AGGREGATED
4.0700 | Freq: Once | INTRAVENOUS | Status: AC | PRN
  Administered 2022-12-23: 4.07 via INTRAVENOUS

## 2022-12-23 NOTE — Telephone Encounter (Signed)
Pt came by the office stating she was still having terrible muscle pain from taking minoxidil and tadalafil together. Pt said she stopped minoxidil 3 days ago and her pain is gone. Pt said she will not continue minoxidil and asked for a call directly from Dr.Sabharwal. pt also asked if she could complete itamar sleep study I explained to her that her insurance does not cover itmar and that she would need an in lab sleep study.   Routed to Dr.Sabharwal (please reply to all)

## 2022-12-23 NOTE — Progress Notes (Signed)
Contacted via MyChart   Good afternoon Tonya Odonnell, your labs have returned: - Thyroid labs have trended up to more sluggish, hypothyroid, range.  TSH is hypothyroid range but free T4 remains elevated (more hyper).  How are you feeling on current dosing Levothyroxine? - Iron and ferritin levels normal now. - CBC continues to show some elevation in hemoglobin and hematocrit, which may have to do with your new diagnosis.  I am wondering if a return to oncology for more assessment of these levels would be good. - Kidney function is normal. - A1c remains in prediabetic range at 5.7%.  Any questions? Keep being amazing!!  Thank you for allowing me to participate in your care.  I appreciate you. Kindest regards, Tank Difiore

## 2022-12-24 ENCOUNTER — Telehealth (HOSPITAL_COMMUNITY): Payer: Self-pay | Admitting: Cardiology

## 2022-12-24 LAB — CBC WITH DIFFERENTIAL/PLATELET
Basophils Absolute: 0.1 10*3/uL (ref 0.0–0.2)
Basos: 1 %
EOS (ABSOLUTE): 0.1 10*3/uL (ref 0.0–0.4)
Eos: 1 %
Hematocrit: 53.6 % — ABNORMAL HIGH (ref 34.0–46.6)
Hemoglobin: 17.7 g/dL — ABNORMAL HIGH (ref 11.1–15.9)
Immature Grans (Abs): 0.1 10*3/uL (ref 0.0–0.1)
Immature Granulocytes: 1 %
Lymphocytes Absolute: 2.2 10*3/uL (ref 0.7–3.1)
Lymphs: 33 %
MCH: 29.3 pg (ref 26.6–33.0)
MCHC: 33 g/dL (ref 31.5–35.7)
MCV: 89 fL (ref 79–97)
Monocytes Absolute: 0.5 10*3/uL (ref 0.1–0.9)
Monocytes: 8 %
Neutrophils Absolute: 3.8 10*3/uL (ref 1.4–7.0)
Neutrophils: 56 %
Platelets: 379 10*3/uL (ref 150–450)
RBC: 6.05 x10E6/uL — ABNORMAL HIGH (ref 3.77–5.28)
RDW: 12.9 % (ref 11.7–15.4)
WBC: 6.8 10*3/uL (ref 3.4–10.8)

## 2022-12-24 LAB — BASIC METABOLIC PANEL
BUN/Creatinine Ratio: 19 (ref 9–23)
BUN: 16 mg/dL (ref 6–24)
CO2: 17 mmol/L — ABNORMAL LOW (ref 20–29)
Calcium: 10 mg/dL (ref 8.7–10.2)
Chloride: 103 mmol/L (ref 96–106)
Creatinine, Ser: 0.83 mg/dL (ref 0.57–1.00)
Glucose: 105 mg/dL — ABNORMAL HIGH (ref 70–99)
Potassium: 4.3 mmol/L (ref 3.5–5.2)
Sodium: 137 mmol/L (ref 134–144)
eGFR: 83 mL/min/{1.73_m2} (ref >59)

## 2022-12-24 LAB — IRON AND TIBC
Iron Saturation: 25 % (ref 15–55)
Iron: 97 ug/dL (ref 27–159)
Total Iron Binding Capacity: 391 ug/dL (ref 250–450)
UIBC: 294 ug/dL (ref 131–425)

## 2022-12-24 LAB — BRAIN NATRIURETIC PEPTIDE: BNP: 38 pg/mL (ref 0.0–100.0)

## 2022-12-24 LAB — FERRITIN: Ferritin: 110 ng/mL (ref 15–150)

## 2022-12-24 LAB — T4, FREE: Free T4: 2.1 ng/dL — ABNORMAL HIGH (ref 0.82–1.77)

## 2022-12-24 LAB — HEMOGLOBIN A1C
Est. average glucose Bld gHb Est-mCnc: 117 mg/dL
Hgb A1c MFr Bld: 5.7 % — ABNORMAL HIGH (ref 4.8–5.6)

## 2022-12-24 LAB — TSH: TSH: 5.57 u[IU]/mL — ABNORMAL HIGH (ref 0.450–4.500)

## 2022-12-24 NOTE — Telephone Encounter (Signed)
Pt called with multiple concerns -has stopped mooxidil and muscle pain has gotten slightly better -reports increase in TSH, elevated Hg (managed by PCP) however would like to know how above issues could contribute in increase in SOB and swelling in skin -pt has difficult time explaining additional symptoms over the phone  Would like to discuss further with provider ASAP-fu appt made 10/22 @ 1145

## 2022-12-25 ENCOUNTER — Ambulatory Visit: Payer: Medicaid Other | Admitting: Internal Medicine

## 2022-12-25 ENCOUNTER — Encounter (HOSPITAL_COMMUNITY): Payer: Medicaid Other

## 2022-12-25 DIAGNOSIS — I272 Pulmonary hypertension, unspecified: Secondary | ICD-10-CM | POA: Diagnosis not present

## 2022-12-25 DIAGNOSIS — Z7689 Persons encountering health services in other specified circumstances: Secondary | ICD-10-CM | POA: Diagnosis not present

## 2022-12-25 DIAGNOSIS — I509 Heart failure, unspecified: Secondary | ICD-10-CM

## 2022-12-25 NOTE — Patient Instructions (Signed)
Full PFT performed today. °

## 2022-12-25 NOTE — Progress Notes (Signed)
Full PFT performed today. °

## 2022-12-26 ENCOUNTER — Other Ambulatory Visit: Payer: Self-pay

## 2022-12-26 ENCOUNTER — Telehealth: Payer: Self-pay

## 2022-12-26 DIAGNOSIS — I272 Pulmonary hypertension, unspecified: Secondary | ICD-10-CM

## 2022-12-26 DIAGNOSIS — R0683 Snoring: Secondary | ICD-10-CM

## 2022-12-26 DIAGNOSIS — I509 Heart failure, unspecified: Secondary | ICD-10-CM

## 2022-12-26 NOTE — Progress Notes (Signed)
12/26/2022  Patient ID: Tonya Odonnell, female   DOB: 28-Aug-1965, 57 y.o.   MRN: [id number]  S/O Patient outreach to assist with affordability of tadalafil 20mg  daily   Medication Access/Adherence -Patient recently prescribed tadalafil 20mg  daily for pulmonary hypertension and is concerned with affordability of this medication -Patient currently has a 2 week supply on hand that she was able to get for $20 at [facility], but she endorses a 90 day supply was going to be >$900 -Patient is enrolled in [place] plan; and per their formulary, this medication should be covered for the prescribed indication of pulmonary hypertension; but it does require a prior authorization -Patient also states she is down to 3 day supply of potassium, and he prescription does not have refills  A/P  Medication Access/Adherence -Prior Authorization sent to insurance for tadalafil 20mg  daily plan through [facility]Meds [facility]) -Patient has a follow-up with cardiology Monday, and she is going to discuss potassium refill with provider.  I instructed her to contact [facility] if she has concerns; she states she is having a hard time getting effective communication from cardiology practice.  Follow-up:  Will monitor progress of PA request and notify patient/provider  Lenna Gilford, PharmD, DPLA

## 2022-12-26 NOTE — Telephone Encounter (Signed)
Pt states she has not received a phone call to set up in lab sleep study. This nurse was not aware of patient's case.  Referral placed in epic and faxed to [facility] sleep clinic today.   1400: Attempted to call patient with update and the number to the [facility], and to explain to patient again that the itamar home sleep study device will need to be returned to our clinic as soon as possible. Unable to reach pt. Left voicemail with call back number.

## 2022-12-29 ENCOUNTER — Telehealth: Payer: Self-pay

## 2022-12-29 DIAGNOSIS — Z7689 Persons encountering health services in other specified circumstances: Secondary | ICD-10-CM | POA: Diagnosis not present

## 2022-12-29 DIAGNOSIS — I272 Pulmonary hypertension, unspecified: Secondary | ICD-10-CM | POA: Diagnosis not present

## 2022-12-29 MED ORDER — TADALAFIL (PAH) 20 MG PO TABS: 20.0000 mg | ORAL_TABLET | Freq: Every day | ORAL | 3 refills | Status: DC

## 2022-12-29 NOTE — Progress Notes (Signed)
12/29/2022  Patient ID: Tonya Odonnell, female   DOB: [birth year], 57 y.o.   MRN: [id number]  Contacted [facility] to verify coverage of tadalafil (Adcirca generic) 20mg  daily, and this is going through now on insurance for $4.  The pharmacy did have to order the medication but state it should arrive tomorrow afternoon.  I called patient to make her aware.  Lenna Gilford, PharmD, DPLA

## 2022-12-29 NOTE — Progress Notes (Signed)
12/29/2022  Patient ID: Tonya Odonnell, female   DOB: Feb 12, 1966, 57 y.o.   MRN: [id number]  Prior authorization for tadalafil 20mg  daily has been approved, but insurance will only cover generic substitution for Adcirca (indicated for [facility]).  New prescription pending to go to [facility] for this specific generic, and I have listed NDC's that will be covered on the script for the pharmacy [phone], [phone], [phone]).  Once order is signed by PCP, I will call [facility] to verify coverage and notify patient.  Lenna Gilford, PharmD, DPLA

## 2022-12-30 ENCOUNTER — Telehealth: Payer: Self-pay

## 2022-12-30 ENCOUNTER — Ambulatory Visit: Payer: Medicaid Other | Attending: Cardiology | Admitting: Cardiology

## 2022-12-30 VITALS — BP 119/87 | HR 94 | Wt 182.0 lb

## 2022-12-30 DIAGNOSIS — E039 Hypothyroidism, unspecified: Secondary | ICD-10-CM | POA: Diagnosis not present

## 2022-12-30 DIAGNOSIS — I10 Essential (primary) hypertension: Secondary | ICD-10-CM

## 2022-12-30 DIAGNOSIS — E079 Disorder of thyroid, unspecified: Secondary | ICD-10-CM | POA: Insufficient documentation

## 2022-12-30 DIAGNOSIS — Z7689 Persons encountering health services in other specified circumstances: Secondary | ICD-10-CM | POA: Diagnosis not present

## 2022-12-30 DIAGNOSIS — L659 Nonscarring hair loss, unspecified: Secondary | ICD-10-CM | POA: Diagnosis not present

## 2022-12-30 DIAGNOSIS — M797 Fibromyalgia: Secondary | ICD-10-CM

## 2022-12-30 DIAGNOSIS — I272 Pulmonary hypertension, unspecified: Secondary | ICD-10-CM

## 2022-12-30 MED ORDER — TADALAFIL (PAH) 20 MG PO TABS: 40.0000 mg | ORAL_TABLET | Freq: Every day | ORAL | 3 refills | Status: DC

## 2022-12-30 NOTE — Progress Notes (Signed)
ADVANCED HEART FAILURE CLINIC NOTE  Referring Physician: Marjie Skiff, NP  Primary Care: Marjie Skiff, NP HF: Dr. Shirlee Latch  HPI: Tonya Odonnell is a 57 y.o. female with recently diagnosed pulmonary hypertension, hypertension, breast cancer status postmastectomy and chemotherapy in 2017 complicated by severe neuropathic/psychosomatic pain, hypothyroidism and fibromyalgia presenting today to establish care.  She was most recently seen on December 12, 2022 at [facility] when she presented with severe pain.  Prior to that she was seen by [facility] rheumatology and diagnosed with fibromyalgia.  2 months prior to her hospitalization she reports having progressive exertional dyspnea with shortness of breath just walking to her mailbox.  2 weeks prior to admission this progressed to chest tightness both at rest and with exertion.  During her hospitalization echocardiogram with EF of 50 to 55% with D-shaped interventricular septum.  She underwent right and left heart catheterization that was significant for pulmonary hypertension with a PVR of 7.3 Wood units.  She was subsequently started on tadalafil 20 mg and pH evaluation was started.  During our prior appointment she reported continuing to have significant pain in all her extremities. She started tadalafil 20mg ; reports that since she started it she has had "pulling pains" in bilateral lower extremities. Due to intolerable pain she went to the ER where work up including CT imaging was negative. She reports IV pain medications helped her pain. She has had this pain since chemotherapy in 2017.  We stopped minoxidil but due to miscommunication she was still taking it. Since stopping minoxidil pain has improved.   12/30/22:  Today she presents for an acute care visit. She reports having pain in her throat with swallowing. She noticed that her T4 was going up; her endocrinologist subsequently reduced her synthroid dosing. Since that time she reports having worsening  pain in her neck and thyroid. She wishes to see an endocrinologist now. CT chest from 12/15/22 normal. From a PH standpoint, she reports that shortness of breath has improved significantly. Also she reports pain has improved in her hips since addition of tadafil. She saw [facility] pulmonology who plan on starting her on Tyvaso.    Past Medical History:  Diagnosis Date   Anemia    "USE TO HAVE IT,NOT NOW,2015,2016   Arthritis    HIPS,KNEES,HANDS   Breast cancer in female (HCC)    Cancer (HCC)    LEFT   Hyperlipidemia    Hypertension    Low iron    Neuromuscular disorder (HCC)    MUSCLE PAIN,FIBROMYALGIA   Osteopenia    PVC (premature ventricular contraction)    Thyroid disease     Current Outpatient Medications  Medication Sig Dispense Refill   ascorbic acid (VITAMIN C) 500 MG tablet Take 500 mg by mouth daily.     Azelaic Acid 15 % gel Apply to face every morning 50 g 3   cetirizine-pseudoephedrine (ZYRTEC-D) 5-120 MG tablet Take 1 tablet by mouth 2 (two) times daily. 60 tablet 4   Cholecalciferol 25 MCG (1000 UT) tablet Take 1 tablet (1,000 Units total) by mouth daily. 90 tablet 4   clobetasol cream (TEMOVATE) 0.05 % Apply 1 application topically 2 (two) times daily. Apply to affected area 30 g 2   cromolyn (OPTICROM) 4 % ophthalmic solution Place 1 drop into both eyes 4 (four) times daily. 10 mL 4   cyanocobalamin (VITAMIN B12) 1000 MCG tablet Take 1,000 mcg by mouth daily.     cyclobenzaprine (FLEXERIL) 10 MG tablet Take 1 tablet (  10 mg total) by mouth 3 (three) times daily as needed for muscle spasms. 60 tablet 1   diclofenac Sodium (VOLTAREN) 1 % GEL Apply topically as needed.     diltiazem 2 % GEL Apply 1 Application topically 3 (three) times daily. Inside the rectum as directed. 30 g 0   ELIDEL 1 % cream Apply to red scaly areas on the face BID for seb derm 30 g 4   furosemide (LASIX) 20 MG tablet Take 1 tablet (20 mg total) by mouth daily. 90 tablet 2   hydrALAZINE (APRESOLINE) 25  MG tablet Take 1 tablet (25 mg total) by mouth 2 (two) times daily. 120 tablet 1   hydrochlorothiazide (HYDRODIURIL) 12.5 MG tablet Take 12.5 mg by mouth daily.     Ivermectin (SOOLANTRA) 1 % CREA Apply 1 Application topically at bedtime. 45 g 2   levothyroxine (SYNTHROID) 112 MCG tablet Take 1 tablet (112 mcg total) by mouth daily. 90 tablet 3   metoprolol succinate (TOPROL XL) 25 MG 24 hr tablet Take 1 tablet (25 mg total) by mouth 2 (two) times daily. 180 tablet 3   omeprazole (PRILOSEC) 40 MG capsule Take 1 capsule (40 mg total) by mouth daily. 30 capsule 12   potassium chloride SA (KLOR-CON M) 20 MEQ tablet Take 1 tablet (20 mEq total) by mouth daily. 30 tablet 0   traMADol (ULTRAM) 50 MG tablet Take 50 mg by mouth every 8 (eight) hours as needed.     valACYclovir (VALTREX) 1000 MG tablet Take 1,000 mg by mouth daily.     fluticasone (FLONASE) 50 MCG/ACT nasal spray Place 2 sprays into both nostrils daily. (Patient taking differently: Place 2 sprays into both nostrils as needed for allergies.) 16 g 2   tadalafil, PAH, (ADCIRCA) 20 MG tablet Take 2 tablets (40 mg total) by mouth daily. NDC's covered by plan: 78295621308, [id number], [id number] 180 tablet 3   No current facility-administered medications for this visit.    Allergies  Allergen Reactions   Lisinopril Other (See Comments)    cough cough       Social History   Socioeconomic History   Marital status: Married    Spouse name: Not on file   Number of children: Not on file   Years of education: Not on file   Highest education level: Not on file  Occupational History   Occupation: disabled  Tobacco Use   Smoking status: Never   Smokeless tobacco: Never  Vaping Use   Vaping status: Never Used  Substance and Sexual Activity   Alcohol use: Never   Drug use: Never   Sexual activity: Not Currently  Other Topics Concern   Not on file  Social History Narrative   Not on file   Social Determinants of Health    Financial Resource Strain: High Risk (05/13/2021)   Received from [facility], [facility]   Overall Financial Resource Strain (CARDIA)    Difficulty of Paying Living Expenses: Very hard  Food Insecurity: No Food Insecurity (12/11/2022)   Hunger Vital Sign    Worried About Running Out of Food in the Last Year: Never true    Ran Out of Food in the Last Year: Never true  Transportation Needs: No Transportation Needs (12/11/2022)   PRAPARE - Administrator, Civil Service (Medical): No    Lack of Transportation (Non-Medical): No  Physical Activity: Inactive (05/13/2021)   Received from [facility], [facility]   Exercise Vital Sign    Days  of Exercise per Week: 0 days    Minutes of Exercise per Session: 0 min  Stress: Stress Concern Present (05/13/2021)   Received from [facility], [facility] - Occupational Stress Questionnaire    Feeling of Stress : Very much  Social Connections: Unknown (07/13/2021)   Received from [facility], [facility]   Social Network    Social Network: Not on file  Intimate Partner Violence: Not At Risk (12/11/2022)   Humiliation, Afraid, Rape, and Kick questionnaire    Fear of Current or Ex-Partner: No    Emotionally Abused: No    Physically Abused: No    Sexually Abused: No      Family History  Problem Relation Age of Onset   Hypertension Mother    Leukemia Mother    Stomach cancer Father    Colon cancer Neg Hx    Colon polyps Neg Hx    Crohn's disease Neg Hx    Esophageal cancer Neg Hx    Rectal cancer Neg Hx     PHYSICAL EXAM: Vitals:   12/30/22 1156  BP: 119/87  Pulse: 94  SpO2: 97%   GENERAL: Well nourished, well developed, and in no apparent distress at rest.  HEENT: Negative for arcus senilis or xanthelasma. There is no scleral icterus.  The mucous membranes are pink and moist.   NECK: Supple, No masses. Normal carotid upstrokes without bruits. No masses or thyromegaly.     CHEST: There are no chest wall deformities. There is no chest wall tenderness. Respirations are unlabored.  Lungs- CTA B/L CARDIAC:  JVP: 7 cm          Normal rate with regular rhythm. No murmurs, rubs or gallops.  Pulses are 2+ and symmetrical in upper and lower extremities. No edema.  ABDOMEN: Soft, non-tender, non-distended. There are no masses or hepatomegaly. There are normal bowel sounds.  EXTREMITIES: Warm and well perfused with no cyanosis, clubbing.  LYMPHATIC: No axillary or supraclavicular lymphadenopathy.  NEUROLOGIC: Patient is oriented x3 with no focal or lateralizing neurologic deficits.  PSYCH: Patients affect is appropriate, there is no evidence of anxiety or depression.  SKIN: Warm and dry; no lesions or wounds.    DATA REVIEW  ECG: 12/16/2022: Sinus tachycardia with ST-T wave inversions in inferior lateral leads as per my personal interpretation  ECHO: 12/12/22: LVEF 50 to 55%, low normal RV function.  D-shaped interventricular septum.  As per my personal interpretation  CATH: 12/12/22: 1.  Normal coronary arteries. 2.  Left ventricular angiography was not performed.  EF was low normal by echo.  Normal left ventricular end-diastolic pressure. 3.  Right heart catheterization showed mildly elevated RA pressure, severe pulmonary hypertension, normal wedge pressure and normal cardiac output.   RA: 11 mmHg RV: 78/6 mmHg PW: 10 mmHg PA: 76/38 with a mean of 52 mmHg.  Pulmonary vascular resistance is 7.3 Woods units Cardiac output: 5.7 with an index of 3.04.   ASSESSMENT & PLAN:  Pulmonary Hypertension - Negative CTA PE  on 12/15/22 negative for acute PE - Seen by rheumatology at [facility]; no diagnosis of autoimmune process; diagnosed with fibromyalgia.  - Negative anti-SCL, ANA with normal hsCRP and ESR.  - RHC on 12/12/22 w/ RA 11, PA 76/38 (52), PCW 10; PVR 7.3WU w/ CI of 3 L/min/m2.  - I suspect she has some combination of Group I + III PH (sleep apnea).  -  Personally reviewed CT imaging; no significant parenchymal lung disease.  -  Continue tadalafil 20mg . She feels that her pain has improved significantly since starting tadalafil. - decreased metoprolol to 25mg  BID previously; will D/C now; no indication for beta blockers at this time.  - reviewed labs from 12/15/22; normal sCr, CBC and hsTrop.  - discussed with pharmD today.  - V/Q scan on 12/23/22; read pending. Will reach out today.  - Sleep study scheduled for 01/12/23.  - PFTs from 12/25/22 unremarkable. - Pulmonology wishes to start Tyvaso, I am concerned about her compliance. She reports intolerable side effects to multiple medications. In addition, no significant ILD or lung disease on CT scans or via PFTs. Would prefer oral agent first as she does not have ILD or primary lung disease on imaging or by PFTs.   2. Hypertension - Very anxious today; SBP 120s.  - D/C metoprolol today.   3. Hypothyroidism - TSH well controlled (1.58 on 10/29/22) - followed by PCP.  - TSH elevated at ~5; T4 mildly elevated. PCP actively adjusting doses currently.  Hx of Breast cancer - s/p mastectomy.  - completed chemotherapy in 2017; reports having significant pain afterwards.   4. Diffuse pain / fibromyalgia - Reports having diffuse pain since completion of chemotherapy in 2017. I do not believe her current LE pain is secondary to tadalafil; appears neuropathic / psychosomatic in nature. Will continue tadalafil for the time being. Work up in ER also negative.  - discontinued minoxidil. Reports improvement in LE pain now.   5. Hair loss - D/C minoxidil; potential side effects with PH therapy.   I spent >40 minutes caring for this patient today including face to face time, ordering and reviewing labs, reviewing records from PCP, seeing the patient, documenting in the record, and arranging follow ups.   Jerardo Costabile Advanced Heart Failure Mechanical Circulatory Support

## 2022-12-30 NOTE — Patient Instructions (Signed)
Medication Changes:  Stop Metoprolol  Increase Tadalafil to 40 mg (2 tablets) daily.    Special Instructions // Education:  Do the following things EVERYDAY: Weigh yourself in the morning before breakfast. Write it down and keep it in a log. Take your medicines as prescribed Eat low salt foods--Limit salt (sodium) to 2000 mg per day.  Stay as active as you can everyday Limit all fluids for the day to less than 2 liters   Follow-Up in: follow up in 1 month with Dr. Shirlee Latch.     If you have any questions or concerns before your next appointment please send [place] a message through [place] or call our office at [phone] Monday-Friday 8 am-5 pm.   If you have an urgent need after hours on the weekend please call your Primary Cardiologist or the Advanced [facility] Failure Clinic in [place] at [phone].   At the Advanced [facility] Failure Clinic, you and your health needs are our priority. We have a designated team specialized in the treatment of Heart Failure. This Care Team includes your primary Heart Failure Specialized Cardiologist (physician), Advanced Practice Providers (APPs- Physician Assistants and Nurse Practitioners), and Pharmacist who all work together to provide you with the care you need, when you need it.   You may see any of the following providers on your designated Care Team at your next follow up:  Dr. Arvilla Meres Dr. Marca Ancona Dr. Dorthula Nettles Dr. Theresia Bough Tonye Becket, NP Robbie Lis, [place] [address] [place], [place] Brynda Peon, NP [place] Lee, NP Clarisa Kindred, NP Enos Fling, PharmD

## 2022-12-30 NOTE — Telephone Encounter (Signed)
Patient returned itamar home sleep study unopened device to the clinic today. Serial number [id number] returned to inventory.

## 2022-12-31 ENCOUNTER — Other Ambulatory Visit: Payer: Self-pay

## 2022-12-31 ENCOUNTER — Emergency Department: Payer: Medicaid Other

## 2022-12-31 ENCOUNTER — Emergency Department
Admission: EM | Admit: 2022-12-31 | Discharge: 2022-12-31 | Disposition: A | Discharge status: Home / Self Care | Payer: Medicaid Other | Attending: Emergency Medicine | Admitting: Emergency Medicine

## 2022-12-31 DIAGNOSIS — R55 Syncope and collapse: Secondary | ICD-10-CM | POA: Diagnosis not present

## 2022-12-31 DIAGNOSIS — R531 Weakness: Secondary | ICD-10-CM | POA: Diagnosis not present

## 2022-12-31 DIAGNOSIS — R4182 Altered mental status, unspecified: Secondary | ICD-10-CM | POA: Diagnosis not present

## 2022-12-31 DIAGNOSIS — R000 Tachycardia, unspecified: Secondary | ICD-10-CM | POA: Diagnosis not present

## 2022-12-31 DIAGNOSIS — E039 Hypothyroidism, unspecified: Secondary | ICD-10-CM | POA: Diagnosis not present

## 2022-12-31 LAB — TROPONIN I (HIGH SENSITIVITY): Troponin I (High Sensitivity): 5 ng/L (ref <18)

## 2022-12-31 LAB — CBC
HCT: 49.9 % — ABNORMAL HIGH (ref 36.0–46.0)
Hemoglobin: 16.7 g/dL — ABNORMAL HIGH (ref 12.0–15.0)
MCH: 28.4 pg (ref 26.0–34.0)
MCHC: 33.5 g/dL (ref 30.0–36.0)
MCV: 84.7 fL (ref 80.0–100.0)
Platelets: 335 10*3/uL (ref 150–400)
RBC: 5.89 MIL/uL — ABNORMAL HIGH (ref 3.87–5.11)
RDW: 12.7 % (ref 11.5–15.5)
WBC: 9.4 10*3/uL (ref 4.0–10.5)
nRBC: 0 % (ref 0.0–0.2)

## 2022-12-31 LAB — BASIC METABOLIC PANEL
Anion gap: 13 (ref 5–15)
BUN: 17 mg/dL (ref 6–20)
CO2: 21 mmol/L — ABNORMAL LOW (ref 22–32)
Calcium: 9.2 mg/dL (ref 8.9–10.3)
Chloride: 101 mmol/L (ref 98–111)
Creatinine, Ser: 0.7 mg/dL (ref 0.44–1.00)
GFR, Estimated: >60 mL/min (ref >60)
Glucose, Bld: 94 mg/dL (ref 70–99)
Potassium: 3.4 mmol/L — ABNORMAL LOW (ref 3.5–5.1)
Sodium: 135 mmol/L (ref 135–145)

## 2022-12-31 LAB — T4, FREE: Free T4: 1.72 ng/dL — ABNORMAL HIGH (ref 0.61–1.12)

## 2022-12-31 LAB — TSH: TSH: 2.667 u[IU]/mL (ref 0.350–4.500)

## 2022-12-31 LAB — BRAIN NATRIURETIC PEPTIDE: B Natriuretic Peptide: 41.9 pg/mL (ref 0.0–100.0)

## 2022-12-31 LAB — CBG MONITORING, ED: Glucose-Capillary: 94 mg/dL (ref 70–99)

## 2022-12-31 NOTE — ED Provider Notes (Signed)
[facility] Provider Note    Event Date/Time   First MD Initiated Contact with Patient 12/31/22 1952     (approximate)   History   Near Syncope   HPI  Tonya Odonnell is a 57 y.o. female   Past medical history of fibromyalgia, anxiety, hypothyroid on Synthroid, pulmonary hypertension, who presents to the emergency department with what she describes as a feeling of "emptiness inside of my head", generalized weakness episode that lasted throughout the morning this morning.  She denies chest pain, abdominal pain, nausea or vomiting or diarrhea, GU symptoms palpitations associated.  She wonders if this is attributed to her blood pressure medication which was adjusted yesterday.  She said she ordered a blood pressure cuff today to check her blood pressure in the setting of feeling unwell, and was worried that it was elevated to 170.  She also notes that her thyroid levels have been increasing and her primary doctor has been decreasing her Synthroid accordingly.  She notes that she has associated frontal neck soreness that has been ongoing for months.  She feels much improved now " it feels like the blood has flushed back into my face and head"  Independent Historian contributed to assessment above: Her son is at the bedside to corroborate information and past medical history as above.  External Medical Documents Reviewed: Cardiology visit dated December 30, 2022 documented past medical history medications      Physical Exam   Triage Vital Signs: ED Triage Vitals  Encounter Vitals Group     BP 12/31/22 1633 (!) 161/110     Systolic BP Percentile --      Diastolic BP Percentile --      Pulse Rate 12/31/22 1633 (!) 111     Resp 12/31/22 1633 18     Temp 12/31/22 1633 98 F (36.7 C)     Temp Source 12/31/22 1633 Oral     SpO2 12/31/22 1633 98 %     Weight 12/31/22 1634 180 lb (81.6 kg)     Height 12/31/22 1634 5\' 3"  (1.6 m)     Head Circumference --       Peak Flow --      Pain Score 12/31/22 1634 2     Pain Loc --      Pain Education --      Exclude from Growth Chart --     Most recent vital signs: Vitals:   12/31/22 1633 12/31/22 2245  BP: (!) 161/110 (!) 129/102  Pulse: (!) 111 92  Resp: 18 18  Temp: 98 F (36.7 C) 97.9 F (36.6 C)  SpO2: 98% 98%    General: Awake, no distress.  CV:  Good peripheral perfusion.  Resp:  Normal effort.  Abd:  No distention.  Other: Anxious appearing slightly tachycardic slightly hypertensive otherwise vital signs normal.  Motor sensor intact throughout, gait is normal, finger-to-nose is normal, no dysarthria or facial asymmetry.  Heart sounds and lung sounds normal.  Soft nontender abdomen.   ED Results / Procedures / Treatments   Labs (all labs ordered are listed, but only abnormal results are displayed) Labs Reviewed  BASIC METABOLIC PANEL - Abnormal; Notable for the following components:      Result Value   Potassium 3.4 (*)    CO2 21 (*)    All other components within normal limits  CBC - Abnormal; Notable for the following components:   RBC 5.89 (*)    Hemoglobin 16.7 (*)  HCT 49.9 (*)    All other components within normal limits  T4, FREE - Abnormal; Notable for the following components:   Free T4 1.72 (*)    All other components within normal limits  BRAIN NATRIURETIC PEPTIDE  TSH  CBG MONITORING, ED  TROPONIN I (HIGH SENSITIVITY)     I ordered and reviewed the above labs they are notable for cell counts and electrolytes largely unremarkable and unchanged from prior testing.  EKG  ED ECG REPORT I, Pilar Jarvis, the attending physician, personally viewed and interpreted this ECG.   Date: 12/31/2022  EKG Time: 1641  Rate: 104  Rhythm: sinus tachycardia  Axis: nl  Intervals:none  ST&T Change: no stemi    RADIOLOGY I independently reviewed and interpreted CT scan of the head see no obvious bleeding or midline shift I also reviewed radiologist's formal read.    PROCEDURES:  Critical Care performed: No  Procedures   MEDICATIONS ORDERED IN ED: Medications - No data to display  IMPRESSION / MDM / ASSESSMENT AND PLAN / ED COURSE  I reviewed the triage vital signs and the nursing notes.                                Patient's presentation is most consistent with acute presentation with potential threat to life or bodily function.  Differential diagnosis includes, but is not limited to, thyroid dysfunction, electrolyte abnormalities, dysrhythmia, ACS, stroke   The patient is on the cardiac monitor to evaluate for evidence of arrhythmia and/or significant heart rate changes.  MDM:    Vague symptoms of "emptiness sensation" in the head that has now subsided.  Generalized weakness that has now subsided.  No reported focal deficits and certainly none currently on my exam so I doubt stroke.  She does have mild tachycardia mild hypertension in the setting of adjustments of her hypertensive medications and titration of her thyroid medications in the setting of slightly increased T4 will check thyroid levels electrolytes today.  CT scan of the head is negative.  EKG unremarkable except for sinus tachycardia.  I doubt cardiopulmonary or neurologic emergency at this time given thus far unremarkable workup including basic labs, CT head, exam.  Pending the remainder of labs, if patient remains asymptomatic she should follow-up with her primary doctor/cardiologist/pulmonologist for ongoing medication management for hypertension and thyroid dysfunction.   -- Workup unremarkable plan for discharge as above.        FINAL CLINICAL IMPRESSION(S) / ED DIAGNOSES   Final diagnoses:  Near syncope     Rx / DC Orders   ED Discharge Orders     None        Note:  This document was prepared using Dragon voice recognition software and may include unintentional dictation errors.    Pilar Jarvis, MD 12/31/22 [phone]

## 2022-12-31 NOTE — Discharge Instructions (Addendum)
Fortunately your testing in the emerged apartment did not show any emergency conditions that would account for your symptoms today.  It does not appear that you are having a stroke, heart problems, and your thyroid testing is actually improving from prior testing.  I am not sure what caused her symptoms but is important to follow-up with both your cardiologist and your regular doctor for follow-up appointment this week for further testing as needed.  Thank you for choosing [place] for your health care today!  Please see your primary doctor this week for a follow up appointment.   If you have any new, worsening, or unexpected symptoms call your doctor right away or come back to the emergency department for reevaluation.  It was my pleasure to care for you today.   Daneil Dan Modesto Charon, MD

## 2022-12-31 NOTE — ED Notes (Signed)
Patient discharged from ED by provider. Discharge instructions reviewed at length with patient by provider. Patient ambulatory from ED in NAD.

## 2022-12-31 NOTE — ED Triage Notes (Signed)
Pt presents to ED with c/o of "I don't feel my head". Pt is acting out of normal in triage. Pt states weakness all over. Pt tearful in triage. Pt recently admitted for CHF and D/C'ed 2 weeks ago.

## 2023-01-01 ENCOUNTER — Other Ambulatory Visit (HOSPITAL_COMMUNITY): Payer: Self-pay | Admitting: Cardiology

## 2023-01-01 DIAGNOSIS — I272 Pulmonary hypertension, unspecified: Secondary | ICD-10-CM

## 2023-01-01 LAB — PULMONARY FUNCTION TEST
DL/VA % pred: 103 %
DL/VA: 4.42 ml/min/mmHg/L
DLCO cor % pred: 99 %
DLCO cor: 19.73 ml/min/mmHg
DLCO unc % pred: 110 %
DLCO unc: 21.93 ml/min/mmHg
FEF 25-75 Post: 3.88 L/s
FEF 25-75 Pre: 3.29 L/s
FEF2575-%Change-Post: 17 %
FEF2575-%Pred-Post: 157 %
FEF2575-%Pred-Pre: 133 %
FEV1-%Change-Post: 4 %
FEV1-%Pred-Post: 107 %
FEV1-%Pred-Pre: 102 %
FEV1-Post: 2.75 L
FEV1-Pre: 2.63 L
FEV1FVC-%Change-Post: 0 %
FEV1FVC-%Pred-Pre: 109 %
FEV6-%Change-Post: 4 %
FEV6-%Pred-Post: 100 %
FEV6-%Pred-Pre: 95 %
FEV6-Post: 3.19 L
FEV6-Pre: 3.04 L
FEV6FVC-%Pred-Post: 103 %
FEV6FVC-%Pred-Pre: 103 %
FVC-%Change-Post: 4 %
FVC-%Pred-Post: 96 %
FVC-%Pred-Pre: 92 %
FVC-Post: 3.19 L
Post FEV1/FVC ratio: 86 %
Post FEV6/FVC ratio: 100 %
Pre FEV1/FVC ratio: 87 %
Pre FEV6/FVC Ratio: 100 %
RV % pred: 74 %
RV: 1.38 L
TLC % pred: 101 %
TLC: 4.97 L

## 2023-01-02 ENCOUNTER — Telehealth: Payer: Self-pay

## 2023-01-02 DIAGNOSIS — I272 Pulmonary hypertension, unspecified: Secondary | ICD-10-CM

## 2023-01-02 NOTE — Telephone Encounter (Signed)
Patient called asking that we let Dr. Gasper Lloyd know she was in the ED on 10/23 for near syncopal episode. She repots that BP at home was 100/97 but that BP was in 170 systolic when she went to ED. She is concerned about increasing tadalafil to 40 mg daily and reports she is still taking only taking 20 mg daily at this time. She states Dr. Gasper Lloyd told her to increase potassium tablets to 2 tabs daily so she has run out early, and needs a refill.  Will route to Dr. Gasper Lloyd for review, and to confirm potassium dose.

## 2023-01-05 MED ORDER — POTASSIUM CHLORIDE CRYS ER 20 MEQ PO TBCR: 40.0000 meq | EXTENDED_RELEASE_TABLET | Freq: Every day | ORAL | 1 refills | Status: DC

## 2023-01-05 NOTE — Telephone Encounter (Signed)
Reviewed Dr. Nyoka Cowden advice with patient. Pt verbalized understanding that she will take 40 meq potassium daily and return in 1 week to [facility] for repeat lab work.  She states she decided to try 40 mg tadalafil over the weekend and reports that it went well. New Rx sent to pt's preferred pharmacy.  BMET order placed. Pt denied any other questions or concerns at this time.

## 2023-01-06 ENCOUNTER — Ambulatory Visit: Payer: Medicaid Other | Attending: Otolaryngology

## 2023-01-06 DIAGNOSIS — I10 Essential (primary) hypertension: Secondary | ICD-10-CM | POA: Insufficient documentation

## 2023-01-06 DIAGNOSIS — I272 Pulmonary hypertension, unspecified: Secondary | ICD-10-CM | POA: Diagnosis not present

## 2023-01-06 DIAGNOSIS — G4733 Obstructive sleep apnea (adult) (pediatric): Secondary | ICD-10-CM | POA: Diagnosis not present

## 2023-01-08 ENCOUNTER — Ambulatory Visit: Payer: Medicaid Other | Admitting: Nurse Practitioner

## 2023-01-08 NOTE — Progress Notes (Signed)
Contacted via MyChart   Chest x-ray is stable with no acute findings:)

## 2023-01-09 DIAGNOSIS — Z419 Encounter for procedure for purposes other than remedying health state, unspecified: Secondary | ICD-10-CM | POA: Diagnosis not present

## 2023-01-11 DIAGNOSIS — Z7689 Persons encountering health services in other specified circumstances: Secondary | ICD-10-CM | POA: Diagnosis not present

## 2023-01-12 ENCOUNTER — Other Ambulatory Visit
Admission: RE | Admit: 2023-01-12 | Discharge: 2023-01-12 | Disposition: A | Discharge status: Home / Self Care | Payer: Medicaid Other | Attending: Cardiology | Admitting: Cardiology

## 2023-01-12 ENCOUNTER — Encounter: Payer: Medicaid Other | Admitting: Internal Medicine

## 2023-01-12 DIAGNOSIS — I272 Pulmonary hypertension, unspecified: Secondary | ICD-10-CM | POA: Insufficient documentation

## 2023-01-12 DIAGNOSIS — Z7689 Persons encountering health services in other specified circumstances: Secondary | ICD-10-CM | POA: Diagnosis not present

## 2023-01-12 LAB — BASIC METABOLIC PANEL
Anion gap: 9 (ref 5–15)
BUN: 19 mg/dL (ref 6–20)
CO2: 23 mmol/L (ref 22–32)
Calcium: 9.3 mg/dL (ref 8.9–10.3)
Chloride: 106 mmol/L (ref 98–111)
Creatinine, Ser: 0.73 mg/dL (ref 0.44–1.00)
GFR, Estimated: >60 mL/min (ref >60)
Glucose, Bld: 95 mg/dL (ref 70–99)
Potassium: 3.7 mmol/L (ref 3.5–5.1)
Sodium: 138 mmol/L (ref 135–145)

## 2023-01-15 DIAGNOSIS — I10 Essential (primary) hypertension: Secondary | ICD-10-CM | POA: Diagnosis not present

## 2023-01-15 DIAGNOSIS — Z7689 Persons encountering health services in other specified circumstances: Secondary | ICD-10-CM | POA: Diagnosis not present

## 2023-01-15 DIAGNOSIS — I272 Pulmonary hypertension, unspecified: Secondary | ICD-10-CM | POA: Diagnosis not present

## 2023-01-16 ENCOUNTER — Encounter: Payer: Self-pay | Admitting: Nurse Practitioner

## 2023-01-16 ENCOUNTER — Ambulatory Visit: Payer: Medicaid Other | Admitting: Nurse Practitioner

## 2023-01-16 VITALS — BP 122/82 | HR 84 | Ht 63.0 in | Wt 185.4 lb

## 2023-01-16 DIAGNOSIS — R1310 Dysphagia, unspecified: Secondary | ICD-10-CM | POA: Diagnosis not present

## 2023-01-16 NOTE — Patient Instructions (Signed)
You have been scheduled for a Barium Esophogram at [facility] (1st floor of the hospital) on 01/21/23 at 11:00 am. Please arrive 30 minutes prior to your appointment for registration. Make certain not to have anything to eat or drink 3 hours prior to your test. If you need to reschedule for any reason, please contact radiology at [phone] to do so. __________________________________________________________________________________  We have sent over a referral to [facility]. You may receive a call from them within the next few weeks regarding an appointment.  Due to recent changes in healthcare laws, you may see the results of your imaging and laboratory studies on MyChart before your provider has had a chance to review them.  We understand that in some cases there may be results that are confusing or concerning to you. Not all laboratory results come back in the same time frame and the provider may be waiting for multiple results in order to interpret others.  Please give [place] 48 hours in order for your provider to thoroughly review all the results before contacting the office for clarification of your results.   Thank you for trusting me with your gastrointestinal care!   Alcide Evener, CRNP

## 2023-01-16 NOTE — Progress Notes (Signed)
01/16/2023 Tonya Odonnell [id number] December 25, 1965   Chief Complaint: Right throat pain   History of Present Illness: Tonya Odonnell is a 57 year old female with a past medical history of fibromyalgia, hypertension, hypothyroidism, breast cancer s/p left mastectomy treated with chemo complicated by neuropathic pain syndrome, uterine fibroids, diverticulosis, anal fissure and tubular adenomatous colon polyps. She is known by Dr. Leonides Schanz. She presents today for further evaluation regarding localized right throat pain which occurs when swallowing solid foods and started 11/2021. Food does not get stuck in her throat or esophagus when swallowing. She denies having any obvious heartburn. She took Omeprazole in the past for reflux symptoms and she elected to restart it 3 weeks ago as she thought it might help her right throat pain. She thinks her right throat pain may have decreased slightly since taking Omeprazole. She stated her hemorrhoidal discomfort and bleeding abated since her last office visit. She is passing normal a normal brown formed stool most days.  She underwent a colonoscopy 08/12/2021 which identified a 15 mm inflammatory polyp and two 4 - 8 mm tubular adenomatous polyps removed from the transverse colon. A repeat colonoscopy in 5 years was recommended.  She underwent an extensive cardiac evaluation due to progressive exertional dyspnea/SOB and stated recently being diagnosed with CHF and pulmonary hypertension. She also stated having a recent thyroid ultrasound which showed a thyroid nodule.      Latest Ref Rng & Units 12/31/2022    4:40 PM 12/22/2022   12:12 PM 12/15/2022   12:14 PM  CBC  WBC 4.0 - 10.5 K/uL 9.4  6.8  7.4   Hemoglobin 12.0 - 15.0 g/dL 78.2  95.6  21.3   Hematocrit 36.0 - 46.0 % 49.9  53.6  53.5   Platelets 150 - 400 K/uL 335  379  348        Latest Ref Rng & Units 01/12/2023    2:44 PM 12/31/2022    4:40 PM 12/22/2022   12:12 PM  CMP  Glucose 70 - 99 mg/dL 95  94   086   BUN 6 - 20 mg/dL 19  17  16    Creatinine 0.44 - 1.00 mg/dL 5.78  4.69  6.29   Sodium 135 - 145 mmol/L 138  135  137   Potassium 3.5 - 5.1 mmol/L 3.7  3.4  4.3   Chloride 98 - 111 mmol/L 106  101  103   CO2 22 - 32 mmol/L 23  21  17    Calcium 8.9 - 10.3 mg/dL 9.3  9.2  52.8        Latest Ref Rng & Units 12/10/2022    8:21 PM 09/17/2022    8:01 AM 07/08/2022   11:06 AM  Hepatic Function  Total Protein 6.5 - 8.1 g/dL 6.8  7.0  6.7   Albumin 3.5 - 5.0 g/dL 4.0  3.9  4.4   AST 15 - 41 U/L 20  16  21    ALT 0 - 44 U/L 25  26  25    Alk Phosphatase 38 - 126 U/L 58  67  88   Total Bilirubin 0.3 - 1.2 mg/dL 2.1  1.0  0.7   Bilirubin, Direct 0.0 - 0.2 mg/dL 0.3       ECHO 41/32/4401: Left ventricular ejection fraction, by estimation, is 50 to 55%. The left ventricle has low normal function. The left ventricle has no regional wall motion abnormalities. There is mild left ventricular hypertrophy. Left ventricular diastolic  parameters are indeterminate. 1. Right ventricular systolic function is low normal. The right ventricular size is not well visualized. 2. 3. The mitral valve is normal in structure. No evidence of mitral valve regurgitation. 4. The aortic valve is tricuspid. Aortic valve regurgitation is not visualized.   Cardiac cath 12/12/2022: 1.  Normal coronary arteries. 2.  Left ventricular angiography was not performed.  EF was low normal by echo.  Normal left ventricular end-diastolic pressure. 3.  Right heart catheterization showed mildly elevated RA pressure, severe pulmonary hypertension, normal wedge pressure and normal cardiac output.   RA: 11 mmHg RV: 78/6 mmHg PW: 10 mmHg PA: 76/38 with a mean of 52 mmHg.  Pulmonary vascular resistance is 7.3 Woods units Cardiac output: 5.7 with an index of 3.04.   Recommendations: The patient has evidence of severe pulmonary arterial hypertension.  There is evidence of RV dysfunction on echocardiogram.  I consulted advanced heart failure  to help with evaluation and management.  CTA 12/15/2022: FINDINGS: VASCULAR   Aorta: Normal caliber aorta without aneurysm, dissection, vasculitis or significant stenosis.   Celiac: Celiac axis is widely patent. No evidence of dissection. The lateral segmental branch of the left hepatic artery is replaced to the left gastric artery. 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   SMA: Patent without evidence of aneurysm, dissection, vasculitis or significant stenosis.   Renals: Both renal arteries are patent without evidence of aneurysm, dissection, vasculitis, fibromuscular dysplasia or significant stenosis.   IMA: Patent without evidence of aneurysm, dissection, vasculitis or significant stenosis.   Inflow: Patent without evidence of aneurysm, dissection, vasculitis or significant stenosis.   Proximal Outflow: Bilateral common femoral and visualized portions of the superficial and profunda femoral arteries are patent without evidence of aneurysm, dissection, vasculitis or significant stenosis.   Veins: No focal venous abnormality.   Review of the MIP images confirms the above findings.   NON-VASCULAR   Lower chest: No acute abnormality.   Hepatobiliary: No focal liver abnormality is seen. No gallstones, gallbladder wall thickening, or biliary dilatation.   Pancreas: Unremarkable. No pancreatic ductal dilatation or surrounding inflammatory changes.   Spleen: No splenic injury or perisplenic hematoma.   Adrenals/Urinary Tract: Adrenal glands are unremarkable. Kidneys are normal, without renal calculi, focal lesion, or hydronephrosis. Bladder is unremarkable.   Stomach/Bowel: Mild scattered colonic diverticula without evidence of active inflammation. No focal bowel wall thickening or evidence of obstruction. Normal appendix in the right lower quadrant.   Lymphatic: No suspicious lymphadenopathy.    Reproductive: Approximately 2 cm exophytic uterine fibroid arising from the posterior aspect of the uterine fundus. No adnexal masses.   Other: No abdominal wall hernia or abnormality. No abdominopelvic ascites. No evidence of retroperitoneal hematoma.   Musculoskeletal: No acute or significant osseous findings.   IMPRESSION: VASCULAR   1. 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 No evidence of rupture. 2. No evidence of active bleeding.   NON-VASCULAR   1. Negative for retroperitoneal hematoma or bleeding within the abdomen or pelvis. 2. Scattered colonic diverticula without evidence of active diverticulitis. 3. Incidental note is made of a uterine fibroid.    PAST GI PROCEDURES:   Colonoscopy 08/12/2021: - The examined portion of the ileum was normal.  - One 15 mm polyp in the transverse colon, removed with a hot snare. Resected and retrieved. Tattooed.  - One 8 mm polyp in the transverse colon,  removed with a hot snare. Resected and retrieved.  - One 4 mm polyp in the transverse colon, removed with a cold snare. Resected and retrieved.  - Diverticulosis in the sigmoid colon.  - Non-bleeding internal hemorrhoids   Surgical [P], colon, transverse x2 HAIR SHAFT, transverse x1 CS, polyp (3) FINDINGS CONSISTENT WITH TUBULAR ADENOMA AND INFLAMMATORY POLYP (LARGER ONE). NO HIGH-GRADE DYSPLASIA OR MALIGNANCY IS SEEN.  Past Medical History:  Diagnosis Date   Anemia    "USE TO HAVE IT,NOT NOW,2015,2016   Arthritis    HIPS,KNEES,HANDS   Breast cancer in female (HCC)    Cancer (HCC)    LEFT   Hyperlipidemia    Hypertension    Low iron    Neuromuscular disorder (HCC)    MUSCLE PAIN,FIBROMYALGIA   Osteopenia    Pulmonary HTN (HCC)    PVC (premature ventricular contraction)    Thyroid disease     Past Surgical History:  Procedure Laterality Date   Left Ovarian removal Left    MASTECTOMY Left    "LYMPHNODES  REMOVED   RIGHT/LEFT HEART CATH AND CORONARY ANGIOGRAPHY N/A 12/12/2022   Procedure: RIGHT/LEFT HEART CATH AND CORONARY ANGIOGRAPHY;  Surgeon: Iran Ouch, MD;  Location: [facility] INVASIVE CV LAB;  Service: Cardiovascular;  Laterality: N/A;    Current Outpatient Medications on File Prior to Visit  Medication Sig Dispense Refill   ascorbic acid (VITAMIN C) 500 MG tablet Take 500 mg by mouth daily.     Azelaic Acid 15 % gel Apply to face every morning 50 g 3   cetirizine-pseudoephedrine (ZYRTEC-D) 5-120 MG tablet Take 1 tablet by mouth 2 (two) times daily. 60 tablet 4   clobetasol cream (TEMOVATE) 0.05 % Apply 1 application topically 2 (two) times daily. Apply to affected area 30 g 2   cromolyn (OPTICROM) 4 % ophthalmic solution Place 1 drop into both eyes 4 (four) times daily. 10 mL 4   cyanocobalamin (VITAMIN B12) 1000 MCG tablet Take 1,000 mcg by mouth daily.     diltiazem 2 % GEL Apply 1 Application topically 3 (three) times daily. Inside the rectum as directed. 30 g 0   ELIDEL 1 % cream Apply to red scaly areas on the face BID for seb derm 30 g 4   fluticasone (FLONASE) 50 MCG/ACT nasal spray Place 2 sprays into both nostrils daily. (Patient taking differently: Place 2 sprays into both nostrils as needed for allergies.) 16 g 2   hydrALAZINE (APRESOLINE) 25 MG tablet Take 1 tablet (25 mg total) by mouth 2 (two) times daily. 120 tablet 1   hydrochlorothiazide (HYDRODIURIL) 12.5 MG tablet Take 12.5 mg by mouth daily.     levothyroxine (SYNTHROID) 112 MCG tablet Take 1 tablet (112 mcg total) by mouth daily. 90 tablet 3   omeprazole (PRILOSEC) 40 MG capsule Take 1 capsule (40 mg total) by mouth daily. 30 capsule 12   potassium chloride SA (KLOR-CON M) 20 MEQ tablet Take 2 tablets (40 mEq total) by mouth daily. 30 tablet 1   tadalafil, PAH, (ADCIRCA) 20 MG tablet Take 2 tablets (40 mg total) by mouth daily. NDC's covered by plan: 16109604540, 98119147829, [id number] 180 tablet 3   traMADol  (ULTRAM) 50 MG tablet Take 50 mg by mouth every 8 (eight) hours as needed.     valACYclovir (VALTREX) 1000 MG tablet Take 1,000 mg by mouth daily.     Cholecalciferol 25 MCG (1000 UT) tablet Take 1 tablet (1,000 Units total) by mouth daily. (Patient not taking: Reported  on 01/16/2023) 90 tablet 4   cyclobenzaprine (FLEXERIL) 10 MG tablet Take 1 tablet (10 mg total) by mouth 3 (three) times daily as needed for muscle spasms. (Patient not taking: Reported on 01/16/2023) 60 tablet 1   diclofenac Sodium (VOLTAREN) 1 % GEL Apply topically as needed. (Patient not taking: Reported on 01/16/2023)     furosemide (LASIX) 20 MG tablet Take 1 tablet (20 mg total) by mouth daily. (Patient not taking: Reported on 01/16/2023) 90 tablet 2   Ivermectin (SOOLANTRA) 1 % CREA Apply 1 Application topically at bedtime. (Patient not taking: Reported on 01/16/2023) 45 g 2   metoprolol succinate (TOPROL XL) 25 MG 24 hr tablet Take 1 tablet (25 mg total) by mouth 2 (two) times daily. (Patient not taking: Reported on 01/16/2023) 180 tablet 3   No current facility-administered medications on file prior to visit.   Allergies  Allergen Reactions   Lisinopril Other (See Comments)    cough cough    Current Medications, Allergies, Past Medical History, Past Surgical History, Family History and Social History were reviewed in [employer] record.  Review of Systems:   Constitutional: Negative for fever, sweats, chills or weight loss.  Respiratory: Negative for shortness of breath.   Cardiovascular: Negative for chest pain, palpitations and leg swelling.  Gastrointestinal: See HPI.  Musculoskeletal: Left sided pain, fibromyalgia  Neurological: Negative for dizziness, headaches or paresthesias.   Physical Exam: BP 122/82   Pulse 84   Ht 5\' 3"  (1.6 m)   Wt 185 lb 6 oz (84.1 kg)   LMP  (LMP Unknown)   BMI 32.84 kg/m   LMP  (LMP Unknown)  General: 57 year old female in no acute distress. Head:  Normocephalic and atraumatic. Eyes: No scleral icterus. Conjunctiva pink . Ears: Normal auditory acuity. Mouth: Dentition intact. No ulcers or lesions.  Lungs: Clear throughout to auscultation. Heart: Regular rate and rhythm, no murmur. Abdomen: Soft, protuberant, nontender and nondistended. No masses or hepatomegaly. Normal bowel sounds x 4 quadrants.  Rectal: Deferred.  Musculoskeletal: Symmetrical with no gross deformities. Extremities: No edema. Neurological: Alert oriented x 4. No focal deficits.  Psychological: Alert and cooperative. Normal mood and affect  Assessment and Recommendations:  57 year old female with throat pain with odynophagia component occurs intermittently since 11/2021 and has progressively worsened over the past few months. Symptoms slightly diminished after she initiated Omeprazole 40 mg daily x 3 weeks. No heartburn. -Barium swallow study with tablet -ENT referral to include laryngoscopy -Okay to continue omeprazole 40 mg p.o. daily for now -Further recommendations to be determined for the above evaluation completed  History of colon polyps.colonoscopy 08/12/2021 which identified a 15 mm inflammatory polyp and two 4 - 8 mm tubular adenomatous polyps removed from the transverse colon.  -Next colon polyp surveillance colonoscopy due 08/2026  Pulmonary hypertension, recently diagnosed

## 2023-01-16 NOTE — Progress Notes (Signed)
I agree with the assessment and plan as outlined by Ms. Kennedy-Smith. 

## 2023-01-18 NOTE — Patient Instructions (Signed)
Pulmonary Hypertension Pulmonary hypertension is a condition that causes high blood pressure in the blood vessels of your lungs. This makes your heart work extra hard to pump blood to your lungs. And when your heart has to work harder, it can be harder for you to breathe. Over time, this can hurt and weaken your heart. What are the causes? This condition can be put into one of five groups based on what causes it. Group 1 happens when the blood vessels that carry blood to your lungs get too thick or stiff. This may happen with no known cause or it may be: Inherited. This means it's passed from parent to child. Caused by another disease, such as a disease of the heart or liver. Caused by some medicines or poisons. Group 2 happens if you have problems with the valves in your heart or if the left side of your heart, also called your left ventricle, gets weak. Group 3 can be caused by long-term diseases of the lungs, such as chronic obstructive pulmonary disease or COPD. This can also happen if you don't get enough oxygen, such as if you have trouble breathing when you sleep or if you live at a high altitude. Group 4 is caused by blood clots in your lungs. Group 5 includes other causes, such as sickle cell anemia or growths of cells that aren't normal called tumors. What are the signs or symptoms? Symptoms may include: Shortness of breath. A cough. Tiredness. Feeling dizzy or light-headed. You may also faint. A fast heart beat. You may also feel your heart flutter or skip a beat. Your lips or fingers turning blue. Chest pain or tightness. How is this diagnosed? This condition may be diagnosed with: Blood tests. Imaging tests. These may include: Chest X-rays. CT scans. An echocardiogram. This is an ultrasound of your heart. A ventilation-perfusion scan. This sees how well air and blood flow in and out of your lungs. A pulmonary function test. This looks at how much air your lungs can  hold. A 6-minute walk test. This may be done to check your breathing during exercise. Cardiac catheterization. This is a procedure that uses a soft tube called a catheter to check the arteries of your heart. A lung biopsy. This is when a small piece of tissue is removed from your lung for testing. How is this treated? There's no cure for this condition. But treatment can help you feel better and can slow the condition down. You may need: Oxygen therapy. Cardiac rehabilitation, or rehab. This is a program that teaches you how to: Care for your heart. Exercise. Go back to your normal activities. Medicines to: Lower your blood pressure. Relax the blood vessels in your lungs. Help your heart pump more blood. Help your body get rid of extra fluid. Thin your blood. This can stop you from getting blood clots. Lung surgery. This can relieve pressure on your heart. You may need this if other treatments don't work. Heart-lung or lung transplant. This may be done in very severe cases. Follow these instructions at home: Eating and drinking  Eat a healthy diet. Eat lots of fresh fruits and vegetables, whole grains, and beans. Limit how much salt you take in to less than 2,300 mg a day. Salt is also called sodium. Activity Get lots of rest. Exercise as told. Ask your health care provider what types of exercise are safe for you. Lifestyle Do not use any products that contain nicotine or tobacco. These products include cigarettes, chewing  tobacco, and vaping devices, such as e-cigarettes. If you need help quitting, ask your provider. Stay away when other people smoke. Do not sit in hot tubs or saunas for long stretches of time. Do not get pregnant. If needed, talk with your provider about birth control. Avoid high altitudes. It can be stressful to live with pulmonary hypertension. Talk with your provider about finding support groups and online help. General instructions Take over-the-counter and  prescription medicines only as told by your provider. Do not change or stop medicines without talking with your provider. Stay up to date on your shots, such as your yearly flu shot and pneumonia shot. Use oxygen therapy at home as told. Keep all follow-up visits. Your provider will check your breathing and make changes to your medicines as needed. Contact a health care provider if: Your cough gets worse. You have more shortness of breath. You start to have trouble doing things you could do before. You need to use more medicines or oxygen, or you need to use them more often than normal. Get help right away if: You have severe shortness of breath. You have chest pain or pressure. You cough up blood. These symptoms may be an emergency. Get help right away. Call 911. Do not wait to see if the symptoms will go away. Do not drive yourself to the hospital. This information is not intended to replace advice given to you by your health care provider. Make sure you discuss any questions you have with your health care provider. Document Revised: 05/12/2022 Document Reviewed: 05/12/2022 Elsevier Patient Education  2024 [employer].

## 2023-01-20 ENCOUNTER — Ambulatory Visit: Payer: Medicaid Other | Admitting: Nurse Practitioner

## 2023-01-20 ENCOUNTER — Encounter: Payer: Self-pay | Admitting: Nurse Practitioner

## 2023-01-20 ENCOUNTER — Telehealth (HOSPITAL_BASED_OUTPATIENT_CLINIC_OR_DEPARTMENT_OTHER): Payer: Medicaid Other | Admitting: Pulmonary Disease

## 2023-01-20 VITALS — BP 127/85 | HR 108 | Temp 98.3°F | Ht 63.0 in | Wt 184.4 lb

## 2023-01-20 DIAGNOSIS — Z6832 Body mass index (BMI) 32.0-32.9, adult: Secondary | ICD-10-CM

## 2023-01-20 DIAGNOSIS — I50810 Right heart failure, unspecified: Secondary | ICD-10-CM | POA: Diagnosis not present

## 2023-01-20 DIAGNOSIS — E66811 Obesity, class 1: Secondary | ICD-10-CM | POA: Diagnosis not present

## 2023-01-20 DIAGNOSIS — I272 Pulmonary hypertension, unspecified: Secondary | ICD-10-CM

## 2023-01-20 DIAGNOSIS — E6609 Other obesity due to excess calories: Secondary | ICD-10-CM | POA: Diagnosis not present

## 2023-01-20 DIAGNOSIS — I200 Unstable angina: Secondary | ICD-10-CM

## 2023-01-20 DIAGNOSIS — G4733 Obstructive sleep apnea (adult) (pediatric): Secondary | ICD-10-CM

## 2023-01-20 NOTE — Assessment & Plan Note (Signed)
BMI 32.66.  Recommended eating smaller high protein, low fat meals more frequently and exercising 30 mins a day 5 times a week with a goal of 10-15lb weight loss in the next 3 months. Patient voiced their understanding and motivation to adhere to these recommendations.

## 2023-01-20 NOTE — Assessment & Plan Note (Signed)
New diagnosis October 2024 with underlying pulmonary hypertension.  Discussed diagnosis at length with her today and offered support.  Will continue Tadalafil as ordered, refills sent and will work on coverage for this.  Referral to [facility] pharmacist to assist, placed last visit.

## 2023-01-20 NOTE — Assessment & Plan Note (Signed)
New diagnosis October 2024. Will continue Taldalafil as ordered, refills up to date.  Referral to [facility] pharmacist to assist last visit.  Will continue collaboration with patient and care team.  Recent labs/notes reviewed.

## 2023-01-20 NOTE — Telephone Encounter (Signed)
Mild OSA AHI 5/h but many RERAs making RDI 09/W Would certainly treat with CPAP but doubt that this is solely accounting for severe PH She has seen Dr Karna Christmas at [facility]

## 2023-01-20 NOTE — Telephone Encounter (Signed)
Patient is not seen at [facility]. Results have been routed to referring provider.

## 2023-01-20 NOTE — Progress Notes (Signed)
BP 127/85 (BP Location: Right Arm, Cuff Size: Normal)   Pulse (!) 108   Temp 98.3 F (36.8 C) (Oral)   Ht 5\' 3"  (1.6 m)   Wt 184 lb 6.4 oz (83.6 kg)   LMP  (LMP Unknown)   SpO2 98%   BMI 32.66 kg/m    Subjective:    Patient ID: Tonya Odonnell, female    DOB: Feb 12, 1966, 57 y.o.   MRN: [id number]  HPI: Tonya Odonnell is a 57 y.o. female  Chief Complaint  Patient presents with   Hypertension    4 week f/up   PULMONARY HYPERTENSION & CHF Saw pulmonary on 12/29/22 who wants to start her on Tyvaso and [facility] Clinic 12/30/22, they stopped her Metoprolol.  Saw a cardiologist on 01/15/23, but she reports that they are not specialized in [facility] and she is scheduled  to see another specialized provider at [facility] on 01/23/23 (Dr. Cloyd Stagers). She did have a sleep study recently, no results as of yet. Hypertension status: stable  Satisfied with current treatment? yes Duration of hypertension: chronic BP monitoring frequency:  not checking BP range:  BP medication side effects:  no Medication compliance: good compliance Aspirin: no Recurrent headaches: no Visual changes: no Palpitations: no Dyspnea: yes Chest pain: no Lower extremity edema: no Dizzy/lightheaded: no     12/10/2022    2:02 PM 12/10/2022    1:59 PM 06/17/2021   11:05 AM 06/13/2021   11:10 AM 06/03/2021   11:17 AM  Depression screen PHQ 2/9  Decreased Interest 1 1 0 0 0  Down, Depressed, Hopeless 1 1 0 0 0  PHQ - 2 Score 2 2 0 0 0  Altered sleeping 3 3 2     Tired, decreased energy 3 3 3     Change in appetite 1 2 0    Feeling bad or failure about yourself  -- 0 1    Trouble concentrating 0 0 1    Moving slowly or fidgety/restless 0 1 0    Suicidal thoughts -- 0 0    PHQ-9 Score 9 11 7     Difficult doing work/chores Very difficult Very difficult         12/10/2022    2:04 PM 06/17/2021   11:05 AM  GAD 7 : Generalized Anxiety Score  Nervous, Anxious, on Edge 0 0  Control/stop worrying 1 0  Worry too much - different things  1 1  Trouble relaxing 0 2  Restless 0 0  Easily annoyed or irritable 0 0  Afraid - awful might happen 1 0  Total GAD 7 Score 3 3  Anxiety Difficulty Somewhat difficult Somewhat difficult      Relevant past medical, surgical, family and social history reviewed and updated as indicated. Interim medical history since our last visit reviewed. Allergies and medications reviewed and updated.  Review of Systems  Constitutional:  Negative for activity change, appetite change, diaphoresis, fatigue and fever.  Respiratory:  Positive for shortness of breath. Negative for cough, chest tightness and wheezing.   Cardiovascular:  Negative for chest pain, palpitations and leg swelling.  Gastrointestinal: Negative.   Endocrine: Negative for cold intolerance and heat intolerance.  Neurological: Negative.   Psychiatric/Behavioral:  Negative for decreased concentration, self-injury, sleep disturbance and suicidal ideas. The patient is nervous/anxious.     Per HPI unless specifically indicated above     Objective:    BP 127/85 (BP Location: Right Arm, Cuff Size: Normal)   Pulse (!) 108   Temp 98.3  F (36.8 C) (Oral)   Ht 5\' 3"  (1.6 m)   Wt 184 lb 6.4 oz (83.6 kg)   LMP  (LMP Unknown)   SpO2 98%   BMI 32.66 kg/m   Wt Readings from Last 3 Encounters:  01/20/23 184 lb 6.4 oz (83.6 kg)  01/16/23 185 lb 6 oz (84.1 kg)  12/31/22 180 lb (81.6 kg)    Physical Exam Vitals and nursing note reviewed.  Constitutional:      General: She is awake. She is not in acute distress.    Appearance: Normal appearance. She is well-developed and well-groomed. She is obese. She is not ill-appearing or toxic-appearing.  HENT:     Head: Normocephalic.     Right Ear: Hearing and external ear normal.     Left Ear: Hearing and external ear normal.     Nose: Nose normal.     Mouth/Throat:     Lips: Pink.     Mouth: Mucous membranes are moist.  Eyes:     General: Lids are normal.        Right eye: No discharge.         Left eye: No discharge.     Conjunctiva/sclera: Conjunctivae normal.     Pupils: Pupils are equal, round, and reactive to light.  Neck:     Thyroid: No thyromegaly.     Vascular: No carotid bruit.  Cardiovascular:     Rate and Rhythm: Normal rate and regular rhythm.     Heart sounds: Normal heart sounds. No murmur heard.    No gallop.  Pulmonary:     Effort: Pulmonary effort is normal. No accessory muscle usage or respiratory distress.     Breath sounds: Normal breath sounds.  Abdominal:     General: Bowel sounds are normal. There is no distension.     Palpations: Abdomen is soft.     Tenderness: There is no abdominal tenderness.  Musculoskeletal:     Cervical back: Normal range of motion and neck supple.     Right lower leg: No edema.     Left lower leg: No edema.  Lymphadenopathy:     Cervical: No cervical adenopathy.  Skin:    General: Skin is warm and dry.  Neurological:     Mental Status: She is alert and oriented to person, place, and time.     Deep Tendon Reflexes: Reflexes are normal and symmetric.     Reflex Scores:      Brachioradialis reflexes are 2+ on the right side and 2+ on the left side.      Patellar reflexes are 2+ on the right side and 2+ on the left side. Psychiatric:        Attention and Perception: Attention normal.        Mood and Affect: Mood is anxious. Affect is tearful.        Speech: Speech normal.        Behavior: Behavior normal. Behavior is cooperative.        Thought Content: Thought content normal.    Results for orders placed or performed during the hospital encounter of 01/12/23  Basic Metabolic Panel (BMET)  Result Value Ref Range   Sodium 138 135 - 145 mmol/L   Potassium 3.7 3.5 - 5.1 mmol/L   Chloride 106 98 - 111 mmol/L   CO2 23 22 - 32 mmol/L   Glucose, Bld 95 70 - 99 mg/dL   BUN 19 6 - 20 mg/dL   Creatinine, Ser  0.73 0.44 - 1.00 mg/dL   Calcium 9.3 8.9 - 81.1 mg/dL   GFR, Estimated >91 >47 mL/min   Anion gap 9 5 - 15       Assessment & Plan:   Problem List Items Addressed This Visit       Cardiovascular and Mediastinum   Pulmonary hypertension, moderate to severe [facility]) - Primary    New diagnosis October 2024. Will continue Taldalafil as ordered, refills up to date.  Referral to [facility] pharmacist to assist last visit.  Will continue collaboration with patient and care team.  Recent labs/notes reviewed.      Relevant Orders   Basic metabolic panel   Right ventricular failure [facility])    New diagnosis October 2024 with underlying pulmonary hypertension.  Discussed diagnosis at length with her today and offered support.  Will continue Tadalafil as ordered, refills sent and will work on coverage for this.  Referral to [facility] pharmacist to assist, placed last visit.      Unstable angina (HCC)    Stable at present with no chest pain.  Has a urgent referral in place for cardiology of her preference at [facility] who speaks [place].        Other   Obesity    BMI 32.66.  Recommended eating smaller high protein, low fat meals more frequently and exercising 30 mins a day 5 times a week with a goal of 10-15lb weight loss in the next 3 months. Patient voiced their understanding and motivation to adhere to these recommendations.        Follow up plan: Return for as scheduled 02/03/23.

## 2023-01-20 NOTE — Assessment & Plan Note (Signed)
Stable at present with no chest pain.  Has a urgent referral in place for cardiology of her preference at [facility] who speaks [place].

## 2023-01-21 ENCOUNTER — Ambulatory Visit
Admission: RE | Admit: 2023-01-21 | Discharge: 2023-01-21 | Disposition: A | Discharge status: Home / Self Care | Payer: Medicaid Other | Source: Ambulatory Visit | Attending: Nurse Practitioner | Admitting: Nurse Practitioner

## 2023-01-21 DIAGNOSIS — R1310 Dysphagia, unspecified: Secondary | ICD-10-CM | POA: Diagnosis not present

## 2023-01-21 DIAGNOSIS — Z7689 Persons encountering health services in other specified circumstances: Secondary | ICD-10-CM | POA: Diagnosis not present

## 2023-01-21 LAB — BASIC METABOLIC PANEL
BUN/Creatinine Ratio: 17 (ref 9–23)
BUN: 15 mg/dL (ref 6–24)
CO2: 21 mmol/L (ref 20–29)
Calcium: 9.9 mg/dL (ref 8.7–10.2)
Chloride: 102 mmol/L (ref 96–106)
Creatinine, Ser: 0.88 mg/dL (ref 0.57–1.00)
Glucose: 95 mg/dL (ref 70–99)
Potassium: 4 mmol/L (ref 3.5–5.2)
Sodium: 141 mmol/L (ref 134–144)
eGFR: 77 mL/min/{1.73_m2} (ref >59)

## 2023-01-21 NOTE — Progress Notes (Signed)
Contacted via MyChart   Good afternoon Tonya Odonnell, your potassium level is stable on this check.  Hold off on any more supplements.  Any questions? Keep being amazing!!  Thank you for allowing me to participate in your care.  I appreciate you. Kindest regards, Tanveer Dobberstein

## 2023-01-23 DIAGNOSIS — I272 Pulmonary hypertension, unspecified: Secondary | ICD-10-CM | POA: Diagnosis not present

## 2023-01-23 DIAGNOSIS — E785 Hyperlipidemia, unspecified: Secondary | ICD-10-CM | POA: Diagnosis not present

## 2023-01-23 DIAGNOSIS — M545 Low back pain, unspecified: Secondary | ICD-10-CM | POA: Diagnosis not present

## 2023-01-23 DIAGNOSIS — Z7689 Persons encountering health services in other specified circumstances: Secondary | ICD-10-CM | POA: Diagnosis not present

## 2023-01-27 ENCOUNTER — Other Ambulatory Visit: Payer: Self-pay | Admitting: Nurse Practitioner

## 2023-01-28 DIAGNOSIS — Z7689 Persons encountering health services in other specified circumstances: Secondary | ICD-10-CM | POA: Diagnosis not present

## 2023-01-28 DIAGNOSIS — R070 Pain in throat: Secondary | ICD-10-CM | POA: Diagnosis not present

## 2023-01-28 NOTE — Telephone Encounter (Signed)
Requested medication (s) are due for refill today: historical medication  Requested medication (s) are on the active medication list: yes   Last refill:  08/12/22  Future visit scheduled: yes in 6 days   Notes to clinic:  historical medication. Do you want to order or refill Rx?     Requested Prescriptions  Pending Prescriptions Disp Refills   valACYclovir (VALTREX) 1000 MG tablet [Pharmacy Med Name: VALACYCLOVIR HCL 1 GRAM TABLET] 90 tablet 4    Sig: TAKE 1 TABLET BY MOUTH EVERY DAY (INS ONLY COVERING 34 DAYS)     Antimicrobials:  Antiviral Agents - Anti-Herpetic Passed - 01/27/2023  1:56 PM      Passed - Valid encounter within last 12 months    Recent Outpatient Visits           1 week ago Pulmonary hypertension, moderate to severe (HCC)   [place] [facility] [place], Corrie Dandy T, NP   1 month ago Pulmonary hypertension, unspecified (HCC)   [place] [facility] [place], Savage T, NP   1 month ago SOB (shortness of breath)   [place] [facility] [place], Corrie Dandy T, NP   2 months ago Hashimoto's thyroiditis   [place] [facility] [place], [place] T, NP   3 months ago Chronic pain syndrome   [place] [facility] [place], Dorie Rank, NP       Future Appointments             In 6 days Marjie Skiff, NP [facility]   In 1 month Willeen Niece, MD [facility]   In 1 month [place], Lavonna Rua, NP  [facility]

## 2023-02-02 ENCOUNTER — Other Ambulatory Visit (HOSPITAL_COMMUNITY): Payer: Self-pay

## 2023-02-02 ENCOUNTER — Encounter: Payer: Self-pay | Admitting: Cardiology

## 2023-02-02 ENCOUNTER — Ambulatory Visit: Payer: Medicaid Other | Attending: Cardiology | Admitting: Cardiology

## 2023-02-02 VITALS — BP 137/83 | HR 96 | Wt 184.0 lb

## 2023-02-02 DIAGNOSIS — I509 Heart failure, unspecified: Secondary | ICD-10-CM | POA: Diagnosis not present

## 2023-02-02 DIAGNOSIS — I272 Pulmonary hypertension, unspecified: Secondary | ICD-10-CM | POA: Diagnosis not present

## 2023-02-02 DIAGNOSIS — Z7689 Persons encountering health services in other specified circumstances: Secondary | ICD-10-CM | POA: Diagnosis not present

## 2023-02-02 MED ORDER — TADALAFIL (PAH) 20 MG PO TABS: 40.0000 mg | ORAL_TABLET | Freq: Every day | ORAL | Status: DC

## 2023-02-02 NOTE — Progress Notes (Signed)
ADVANCED HEART FAILURE CLINIC NOTE  Primary Care: Marjie Skiff, NP HF Cardiology: Dr. Shirlee Latch  HPI: Tonya Odonnell is a 57 y.o. female with pulmonary hypertension, hypertension, breast cancer status postmastectomy and chemotherapy in 2017 complicated by severe neuropathic/psychosomatic pain, hypothyroidism and fibromyalgia.  She was admitted 10/24 at [facility] with progressive  dyspnea as well as pain.  Prior to that she was seen by [facility] rheumatology and diagnosed with fibromyalgia.  2 months prior to her hospitalization she reports having progressive exertional dyspnea with shortness of breath just walking to her mailbox.  2 weeks prior to admission this progressed to chest tightness both at rest and with exertion.  During her hospitalization, echocardiogram was done showing EF of 50 to 55% with D-shaped interventricular septum.  She underwent right and left heart catheterization that was significant for pulmonary hypertension with a PVR of 7.3 Wood units.  CTA chest during this admission showed no PE, no parenchymal lung disease.  PFTs in 10/24 were surprisingly normal.  V/Q scan in 10/24 showed no evidence for chronic PE.  She was subsequently started on tadalafil 20 mg and PH evaluation was started.  Sleep study showed mild OSA, pulmonary did not recommend CPAP.   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  She has been started on tadalafil 40 mg daily and reports that this has significantly helped her breathing.  She remains significantly limited, however.  She is short of breath dressing, cooking, and walking up stairs.  She does ok walking for short distances on flat ground.  No orthopnea/PND.  She has some atypical chest pain, not exertional/no trigger.  No palpitations/lightheadedness. Weight is up 2 lbs.   Labs (2/24): CCP negative, RF negative Labs (10/24): BNP 42, anti-SCL 70 negative, ANA negative, HIV negative Labs (11/24): K 4, creatinine 0.88  ECG  (personally reviewed): NSR, inferior and anterolateral TWIs  PMH: 1. Fibromyalgia: Has seen rheumatology at [facility].  2. HTN 3. Hypothyroidism 4. Breast cancer: 2017, Treated with chemotherapy and mastectomy.  5. Pulmonary hypertension: Echo (10/24) with EF 50-55%, D-shaped septum suggestive of RV pressure/volume overload, RV poorly visualized but appeared dilated and dysfunctional.   - RHC/LHC (10/24): No significant CAD; mean RA 11, PA 76/38 mean 52, mean PCWP 10, PVR 7.3 WU, CI 3.04 - V/Q scan (10/24): No evidence for chronic PE.  - CTA chest (10/24): No acute PE, no evidence for lung parenchymal disease.  - PFTs (10/24): FVC 92%, FEV1 102%, TLC 101%, DLCO normal (surprising).   - Sleep study (10/24): mild OSA.  - CCP/RF negative. Anti-SCL70, HIV, and ANA negative.   SH: Nonsmoker, no ETOH, married, originally from [place].   FH: No pulmonary hypertension or cardiomyopathy that she knows of.   ROS: All systems reviewed and negative except as per HPI.    Current Outpatient Medications  Medication Sig Dispense Refill   ascorbic acid (VITAMIN C) 500 MG tablet Take 500 mg by mouth daily.     Azelaic Acid 15 % gel Apply to face every morning 50 g 3   cetirizine-pseudoephedrine (ZYRTEC-D) 5-120 MG tablet Take 1 tablet by mouth 2 (two) times daily. 60 tablet 4   Cholecalciferol 25 MCG (1000 UT) tablet Take 1 tablet (1,000 Units total) by mouth daily. 90 tablet 4   clobetasol cream (TEMOVATE) 0.05 % Apply 1 application topically 2 (two) times daily. Apply to affected area 30 g 2   cromolyn (OPTICROM) 4 % ophthalmic solution Place 1 drop into both eyes 4 (  four) times daily. 10 mL 4   cyanocobalamin (VITAMIN B12) 1000 MCG tablet Take 1,000 mcg by mouth daily.     diltiazem 2 % GEL Apply 1 Application topically 3 (three) times daily. Inside the rectum as directed. 30 g 0   ELIDEL 1 % cream Apply to red scaly areas on the face BID for seb derm 30 g 4   furosemide (LASIX) 20 MG tablet Take 1  tablet (20 mg total) by mouth daily. 90 tablet 2   hydrALAZINE (APRESOLINE) 25 MG tablet Take 1 tablet (25 mg total) by mouth 2 (two) times daily. 120 tablet 1   hydrochlorothiazide (HYDRODIURIL) 12.5 MG tablet Take 12.5 mg by mouth daily.     Ivermectin (SOOLANTRA) 1 % CREA Apply 1 Application topically at bedtime. 45 g 2   levothyroxine (SYNTHROID) 112 MCG tablet Take 1 tablet (112 mcg total) by mouth daily. 90 tablet 3   omeprazole (PRILOSEC) 40 MG capsule Take 1 capsule (40 mg total) by mouth daily. 30 capsule 12   traMADol (ULTRAM) 50 MG tablet Take 50 mg by mouth every 8 (eight) hours as needed.     valACYclovir (VALTREX) 1000 MG tablet TAKE 1 TABLET BY MOUTH EVERY DAY (INS ONLY COVERING 34 DAYS) 90 tablet 4   fluticasone (FLONASE) 50 MCG/ACT nasal spray Place 2 sprays into both nostrils daily. (Patient taking differently: Place 2 sprays into both nostrils as needed for allergies.) 16 g 2   tadalafil, PAH, (ADCIRCA) 20 MG tablet Take 2 tablets (40 mg total) by mouth daily. NDC's covered by plan: 40981191478, 29562130865, [id number]     No current facility-administered medications for this visit.    Allergies  Allergen Reactions   Lisinopril Other (See Comments)    cough cough     PHYSICAL EXAM: Vitals:   02/02/23 1017  BP: 137/83  Pulse: 96  SpO2: 95%   General: NAD Neck: No JVD, no thyromegaly or thyroid nodule.  Lungs: Clear to auscultation bilaterally with normal respiratory effort. CV: Nondisplaced PMI.  Heart regular S1/S2, no S3/S4, no murmur.  No peripheral edema.  No carotid bruit.  Normal pedal pulses.  Abdomen: Soft, nontender, no hepatosplenomegaly, no distention.  Skin: Intact without lesions or rashes.  Neurologic: Alert and oriented x 3.  Psych: Normal affect. Extremities: No clubbing or cyanosis.  HEENT: Normal.    ASSESSMENT & PLAN:  1. Pulmonary hypertension/RV failure: Echo in 10/24 showed EF 50-55%; RV was poorly visualized but there was a D-shaped  interventricular septum and concern for RV dilation/dysfunction.  RHC/LHC in 10/24 showed no CAD; mean RA 11, PA 76/38 mean 52, mean PWP 10, PVR 7.3 WU and CI 3.04.  PE CT 10/24 showed no acute PE and no parenchymal lung disease.  V/Q scan 10/24 showed no chronic PE.  PFTs in 10/24 were normal (including, surprisingly, DLCO).  Rheumatology serologies and HIV were negative.  Sleep study showed mild OSA.  No FH of pulmonary hypertension.  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 On exam today, she is not volume overloaded. NYHA class III.  - Continue Lasix 20 mg daily.  BMET/BNP today.   - Continue tadalafil 40 mg daily, she feels like this has helped.  - Start Letairis 5 mg daily and titrate up to 10 mg daily (insurance prefers over macitentan).   - Would aim to get her on Uptravi next (rather than Tyvaso given no parenchymal lung disease).  Will start this after next visit. She  may have trouble with this due to side effects (has diffuse pain already from fibromyalgia).   - Eventual sotatercept.  - Will need 6 minute walk (running late today so will do next appt).  2. HTN: BP controlled, she is only taking hydralazine 25 mg bid.  3. Chest pain: Atypical. Normal coronaries in 10/24.  It is possible that this could be related to pulmonary hypertension, but also may be a product of her fibromyalgia.  4. Fibromyalgia: With diffuse pain.  Evaluation has not found a specific rheumatologic diagnosis.   She will followup with me in 4-6 weeks, will see her in the [place] office to facilitate getting her on her PH meds.   I spent >40 minutes caring for this patient today including face to face time, ordering and reviewing labs, reviewing records from other MDs, seeing the patient, documenting in the record, and arranging follow ups.    Marca Ancona 02/02/2023

## 2023-02-02 NOTE — Patient Instructions (Signed)
There has been no changes to your medications.  Your physician recommends that you schedule a follow-up appointment in: 4 weeks at the [facility] office.  If you have any questions or concerns before your next appointment please send [place] a message through [place] or call our office at [phone].    TO LEAVE A MESSAGE FOR THE NURSE SELECT OPTION 2, PLEASE LEAVE A MESSAGE INCLUDING: YOUR NAME DATE OF BIRTH CALL BACK NUMBER REASON FOR CALL**this is important as we prioritize the call backs  YOU WILL RECEIVE A CALL BACK THE SAME DAY AS LONG AS YOU CALL BEFORE 4:00 PM  At the [facility], you and your health needs are our priority. As part of our continuing mission to provide you with exceptional heart care, we have created designated Provider Care Teams. These Care Teams include your primary Cardiologist (physician) and Advanced Practice Providers (APPs- Physician Assistants and Nurse Practitioners) who all work together to provide you with the care you need, when you need it.   You may see any of the following providers on your designated Care Team at your next follow up: Dr Arvilla Meres Dr Marca Ancona Dr. Dorthula Nettles Dr. Clearnce Hasten Amy Filbert Schilder, NP Robbie Lis, [place] [facility] [place], [place] Brynda Peon, NP [place] Lee, NP Karle Plumber, PharmD   Please be sure to bring in all your medications bottles to every appointment.    Thank you for choosing [facility]

## 2023-02-03 ENCOUNTER — Encounter: Payer: Self-pay | Admitting: Nurse Practitioner

## 2023-02-03 ENCOUNTER — Ambulatory Visit: Payer: Medicaid Other | Admitting: Nurse Practitioner

## 2023-02-03 DIAGNOSIS — Z7689 Persons encountering health services in other specified circumstances: Secondary | ICD-10-CM | POA: Diagnosis not present

## 2023-02-04 ENCOUNTER — Encounter: Payer: Medicaid Other | Admitting: Cardiology

## 2023-02-04 ENCOUNTER — Other Ambulatory Visit (HOSPITAL_COMMUNITY): Payer: Self-pay

## 2023-02-06 DIAGNOSIS — Z7689 Persons encountering health services in other specified circumstances: Secondary | ICD-10-CM | POA: Diagnosis not present

## 2023-02-08 DIAGNOSIS — Z419 Encounter for procedure for purposes other than remedying health state, unspecified: Secondary | ICD-10-CM | POA: Diagnosis not present

## 2023-02-09 ENCOUNTER — Other Ambulatory Visit: Payer: Self-pay

## 2023-02-09 ENCOUNTER — Other Ambulatory Visit (HOSPITAL_COMMUNITY): Payer: Self-pay

## 2023-02-09 ENCOUNTER — Encounter (HOSPITAL_COMMUNITY): Payer: Self-pay

## 2023-02-09 MED ORDER — AMBRISENTAN 5 MG PO TABS
5.0000 mg | ORAL_TABLET | Freq: Every day | ORAL | 3 refills | Status: DC
  Filled 2023-02-09 – 2023-02-12 (×3): qty 30, 30d supply, fill #0

## 2023-02-09 NOTE — Progress Notes (Signed)
Specialty Pharmacy Initial Fill Coordination Note  Tonya Odonnell is a 57 y.o. female contacted today regarding initial fill of specialty medication(s) Ambrisentan [name]  Patient requested Delivery   Delivery date: 02/13/23   Verified address: [address] [place], [place] [address]   Medication will be filled on 02/12/2023.   Patient is aware of $4 copayment.

## 2023-02-09 NOTE — Progress Notes (Signed)
Specialty Pharmacy Initiation Note   Tonya Odonnell is a 56 y.o. female who will be followed by the specialty pharmacy service for RxSp Cardiology    Review of administration, indication, effectiveness, safety, potential side effects, storage/disposable, and missed dose instructions occurred today for patient's specialty medication(s) ambrisentan. Given history of medication intolerance, the mutual decision was made to start at 5 mg daily with plans to titrate to 10 mg as tolerated.   Patient/Caregiver did not have any additional questions or concerns.   Patient's therapy is appropriate to: Initiate    Goals Addressed             This Visit's Progress    Reduce signs and symptoms       Patient is initiating therapy. Patient will maintain adherence and be monitored by provider to determine if a change in treatment plan is warranted         Marygrace Drought Specialty Pharmacist

## 2023-02-10 ENCOUNTER — Other Ambulatory Visit: Payer: Self-pay

## 2023-02-10 DIAGNOSIS — I270 Primary pulmonary hypertension: Secondary | ICD-10-CM | POA: Diagnosis not present

## 2023-02-11 ENCOUNTER — Encounter: Payer: Self-pay | Admitting: Nurse Practitioner

## 2023-02-12 ENCOUNTER — Other Ambulatory Visit: Payer: Self-pay

## 2023-02-12 ENCOUNTER — Telehealth: Payer: Self-pay | Admitting: *Deleted

## 2023-02-12 ENCOUNTER — Other Ambulatory Visit (HOSPITAL_COMMUNITY): Payer: Self-pay

## 2023-02-12 NOTE — Telephone Encounter (Signed)
Pt called upset about her medications and management of the medications. She states she is getting conflicting information/plans from Dr Shirlee Latch (cards) and Dr Cloyd Stagers [facility] pulm) regarding treatment of her pulm htn.  02/02/23 per Dr Shirlee Latch:  - Continue tadalafil 40 mg daily, she feels like this has helped.  - Start Letairis 5 mg daily and titrate up to 10 mg daily (insurance prefers over macitentan).   - Would aim to get her on Uptravi next (rather than Tyvaso given no parenchymal lung disease).  Will start this after next visit. She may have trouble with this due to side effects (has diffuse pain already from fibromyalgia).   - Eventual sotatercept.    02/10/23 per Dr Karna Christmas [facility] Pulm):  Patient has diverticular disease and therefore is not a good candidate for PO pulmonary vasodilator as she could develop significant GI bleeding. I would recommend Tyvasso DPI initially  She underwent right and left heart catheterization that was significant for pulmonary hypertension with a PVR of 7.3 Wood units. She was subsequently started on tadalafil 20 mg and pH evaluation was started.  -she has been denies for PH meds -she has diverticular disease so I would not start her on PO pulmonary vasodilators since its a relative contraindication with GI bleed.    Pt is currently taking Tadalafil 20 mg Daily, Letairis was just approved and shipped to patient today Pulm is in process of getting her approved for Tyvaso   **Patient is confused on what meds to take and feels like the MDs need to discuss her case with each other and advise her on what's best for her plan/care.  Dr Shirlee Latch please call patient to discuss

## 2023-02-12 NOTE — Progress Notes (Signed)
REMS- Loopback information  Transation ID# [id number] (RX# + fill #) MD rems [id number] PT rems [id number] Manufacturer: Melene Muller = [name] [id number] exp 02/19/2023  Dispensed on 02/12/23 and shipped to patient

## 2023-02-13 ENCOUNTER — Other Ambulatory Visit (HOSPITAL_COMMUNITY): Payer: Self-pay

## 2023-02-13 ENCOUNTER — Other Ambulatory Visit: Payer: Self-pay

## 2023-02-13 ENCOUNTER — Telehealth: Payer: Self-pay | Admitting: Pharmacist

## 2023-02-13 ENCOUNTER — Encounter: Payer: Self-pay | Admitting: Pharmacist

## 2023-02-13 DIAGNOSIS — Z7689 Persons encountering health services in other specified circumstances: Secondary | ICD-10-CM | POA: Diagnosis not present

## 2023-02-13 NOTE — Telephone Encounter (Signed)
Spoke with the patient in detail regarding recent concerns with her ambrisentan. She reports that she has had anxiety in starting it because of the potential adverse effects of swelling. She mentioned that Dr. Shirlee Latch spoke with her earlier and answered her questions. In an abundance of caution, the education was reiterated during this phone conversation as well. It was explained that with starting this medication, if she begins to notice any swelling or increases in weight, she may reach out to the clinic to manage swelling with diuresis or compression socks and monitor closely. Risk of bleeding was also discussed. In the ARIES trial, there was a reduction in hemoglobin without an increased risk of bleeding, The patient's hemoglobin is actually above normal range. The patient reported feeling far less anxious after the conversation and will begin taking the medication when it arrives. She was informed that this will require frequent lab monitoring with initiation to ensure safety and advised to reach out with any questions or concerns.

## 2023-02-13 NOTE — Telephone Encounter (Signed)
I spoke with her.  She had mild diverticulosis on a CT abdomen in 3/24.  I do not think she has a significantly elevated GI bleeding risk with pulmonary vasodilators.  She does have a lot of questions about starting Letairis.  [name], could you give her a call?

## 2023-02-15 DIAGNOSIS — Z7689 Persons encountering health services in other specified circumstances: Secondary | ICD-10-CM | POA: Diagnosis not present

## 2023-02-17 ENCOUNTER — Other Ambulatory Visit: Payer: Self-pay

## 2023-02-17 ENCOUNTER — Telehealth: Payer: Self-pay | Admitting: Pharmacist

## 2023-02-17 NOTE — Telephone Encounter (Signed)
Patient called with questions regarding PAH medications. Noted some flushing with Letairis ~1 hour after taking the dose. Reports this is tolerable. Complains of some edema that has been consistent for months and not acutely worsened by Letairis. No other complaints. Also asked questions regarding Tyvaso inhaler. Instructed the patient to discuss this with the MD at her upcoming visit.

## 2023-02-17 NOTE — Progress Notes (Signed)
   02/17/2023  Patient ID: Tonya Odonnell, female   DOB: 09/20/1965, 57 y.o.   MRN: [id number]  Patient outreach to discuss medication management of pulmonary hypertension.  Patient has been seen by both pulmonary and cardiology specialists at [facility], and [facility] and is unclear on preferred method of treatment for her PAH.  She states Tonya Odonnell was covered by insurance, and she has been taking for 4 days now, noting some edema since starting therapy.  She was told to take this medication by cardiology but told to use Tyvasso by pulmonology due to increased risk of bleeding with Letairis and her PMH.  Patient is confused and concerned, because she is not sure that specialities are communicating, and she is not sure best therapy for her.  She did reach back out to clinical pharmacist with cardiology and was advised to discuss further with Tonya Odonnell at 12/19 visit coming up.  Per chart note from [facility]: "Tonya Odonnell is happy to see her in clinic but she would have to follow his recommendations. She said that she was told that by her cardiologist that Opsumit was denied so he prescribed Letairis and she currently has Letairis. She was reluctant to start if after hearing by the pharmacist that it could cause swelling. I told her again that Letairis and Opsumit are in the same drug category and it is not safe to prescribe multiple medications. I told her that I can see the her pulmonologist prescribed Tyvaso because as she stated before he thought that oral PH meds could cause bleeding and our doctors did not think that was a concern. She said that her cardiologist said that he does not think oral ph meds would cause bleeding either."  Based on this, it does sound like both [facility] cardiology and [facility] pulmonology are in agreement around [facility] therapy at this time.  Lenna Gilford, PharmD, DPLA

## 2023-02-20 ENCOUNTER — Other Ambulatory Visit (HOSPITAL_COMMUNITY): Payer: Self-pay

## 2023-02-20 ENCOUNTER — Other Ambulatory Visit: Payer: Self-pay | Admitting: Pharmacist

## 2023-02-20 DIAGNOSIS — Z7689 Persons encountering health services in other specified circumstances: Secondary | ICD-10-CM | POA: Diagnosis not present

## 2023-02-20 MED ORDER — AMBRISENTAN 5 MG PO TABS
5.0000 mg | ORAL_TABLET | Freq: Every day | ORAL | 11 refills | Status: DC
  Filled 2023-02-20 – 2023-03-05 (×2): qty 30, 30d supply, fill #0
  Filled 2023-04-03: qty 30, 30d supply, fill #1

## 2023-02-20 NOTE — Progress Notes (Signed)
Ambrisentan resent

## 2023-02-26 ENCOUNTER — Ambulatory Visit (HOSPITAL_COMMUNITY)
Admission: RE | Admit: 2023-02-26 | Discharge: 2023-02-26 | Disposition: A | Discharge status: Home / Self Care | Payer: Medicaid Other | Source: Ambulatory Visit | Attending: Cardiology | Admitting: Cardiology

## 2023-02-26 ENCOUNTER — Other Ambulatory Visit: Payer: Self-pay

## 2023-02-26 ENCOUNTER — Encounter (HOSPITAL_COMMUNITY): Payer: Self-pay | Admitting: Cardiology

## 2023-02-26 VITALS — BP 120/86 | HR 100 | Ht 63.0 in | Wt 183.8 lb

## 2023-02-26 DIAGNOSIS — I2721 Secondary pulmonary arterial hypertension: Secondary | ICD-10-CM | POA: Diagnosis not present

## 2023-02-26 DIAGNOSIS — I10 Essential (primary) hypertension: Secondary | ICD-10-CM | POA: Insufficient documentation

## 2023-02-26 DIAGNOSIS — Z79899 Other long term (current) drug therapy: Secondary | ICD-10-CM | POA: Diagnosis not present

## 2023-02-26 DIAGNOSIS — I272 Pulmonary hypertension, unspecified: Secondary | ICD-10-CM | POA: Diagnosis not present

## 2023-02-26 DIAGNOSIS — M797 Fibromyalgia: Secondary | ICD-10-CM | POA: Diagnosis not present

## 2023-02-26 DIAGNOSIS — G4733 Obstructive sleep apnea (adult) (pediatric): Secondary | ICD-10-CM | POA: Diagnosis not present

## 2023-02-26 DIAGNOSIS — E039 Hypothyroidism, unspecified: Secondary | ICD-10-CM | POA: Diagnosis not present

## 2023-02-26 DIAGNOSIS — R0609 Other forms of dyspnea: Secondary | ICD-10-CM | POA: Insufficient documentation

## 2023-02-26 DIAGNOSIS — Z7689 Persons encountering health services in other specified circumstances: Secondary | ICD-10-CM | POA: Diagnosis not present

## 2023-02-26 DIAGNOSIS — R0602 Shortness of breath: Secondary | ICD-10-CM | POA: Diagnosis not present

## 2023-02-26 LAB — BASIC METABOLIC PANEL
Anion gap: 13 (ref 5–15)
BUN: 20 mg/dL (ref 6–20)
CO2: 21 mmol/L — ABNORMAL LOW (ref 22–32)
Calcium: 9.2 mg/dL (ref 8.9–10.3)
Chloride: 106 mmol/L (ref 98–111)
Creatinine, Ser: 0.89 mg/dL (ref 0.44–1.00)
GFR, Estimated: >60 mL/min (ref >60)
Glucose, Bld: 99 mg/dL (ref 70–99)
Potassium: 3.5 mmol/L (ref 3.5–5.1)
Sodium: 140 mmol/L (ref 135–145)

## 2023-02-26 LAB — BRAIN NATRIURETIC PEPTIDE: B Natriuretic Peptide: 24 pg/mL (ref 0.0–100.0)

## 2023-02-26 MED ORDER — FUROSEMIDE 40 MG PO TABS: 40.0000 mg | ORAL_TABLET | Freq: Every day | ORAL | 3 refills | Status: DC

## 2023-02-26 MED ORDER — POTASSIUM CHLORIDE CRYS ER 20 MEQ PO TBCR: 20.0000 meq | EXTENDED_RELEASE_TABLET | Freq: Every day | ORAL | 3 refills | Status: DC

## 2023-02-26 NOTE — Progress Notes (Signed)
ADVANCED HEART FAILURE CLINIC NOTE  Primary Care: Marjie Skiff, NP HF Cardiology: Dr. Shirlee Latch  HPI: Tonya Odonnell is a 57 y.o. female with pulmonary hypertension, hypertension, breast cancer status postmastectomy and chemotherapy in 2017 complicated by severe neuropathic/psychosomatic pain, hypothyroidism and fibromyalgia.  She was admitted 10/24 at [facility] with progressive  dyspnea as well as pain.  Prior to that she was seen by [facility] rheumatology and diagnosed with fibromyalgia.  2 months prior to her hospitalization she reports having progressive exertional dyspnea with shortness of breath just walking to her mailbox.  2 weeks prior to admission this progressed to chest tightness both at rest and with exertion.  During her hospitalization, echocardiogram was done showing EF of 50 to 55% with D-shaped interventricular septum.  She underwent right and left heart catheterization that was significant for pulmonary hypertension with a PVR of 7.3 Wood units.  CTA chest during this admission showed no PE, no parenchymal lung disease.  PFTs in 10/24 were surprisingly normal.  V/Q scan in 10/24 showed no evidence for chronic PE.  She was subsequently started on tadalafil 20 mg and PH evaluation was started.  Sleep study showed mild OSA, pulmonary did not recommend CPAP.   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  She has been started on tadalafil 40 mg daily and ambrisentan 5 mg daily.  She feels like these medications have helped her breathing, but she feels like she has gained fluid weight on ambrisentan, especially in her abdomen (feels bloated). Weight is actually down 1 lb. She was able to walk her dogs recently, has not done this for a long time.  She is short of breath with moderate exertion or with taking a hot shower.  No chest pain.  No lightheadedness.  No orthopnea/PND.    Labs (2/24): CCP negative, RF negative Labs (10/24): BNP 42, anti-SCL 70 negative, ANA  negative, HIV negative Labs (11/24): K 4, creatinine 0.88  ECG (personally reviewed): sinus tachycardia at 109 bpm, nonspecific T wave flattening  6 minute walk (12/24): 457 m  PMH: 1. Fibromyalgia: Has seen rheumatology at [facility].  2. HTN 3. Hypothyroidism 4. Breast cancer: 2017, Treated with chemotherapy and mastectomy.  5. Pulmonary hypertension: Echo (10/24) with EF 50-55%, D-shaped septum suggestive of RV pressure/volume overload, RV poorly visualized but appeared dilated and dysfunctional.   - RHC/LHC (10/24): No significant CAD; mean RA 11, PA 76/38 mean 52, mean PCWP 10, PVR 7.3 WU, CI 3.04 - V/Q scan (10/24): No evidence for chronic PE.  - CTA chest (10/24): No acute PE, no evidence for lung parenchymal disease.  - PFTs (10/24): FVC 92%, FEV1 102%, TLC 101%, DLCO normal (surprising).   - Sleep study (10/24): mild OSA.  - CCP/RF negative. Anti-SCL70, HIV, and ANA negative.   SH: Nonsmoker, no ETOH, married, originally from [place].   FH: No pulmonary hypertension or cardiomyopathy that she knows of.   ROS: All systems reviewed and negative except as per HPI.    Current Outpatient Medications  Medication Sig Dispense Refill   ambrisentan (LETAIRIS) 5 MG tablet Take 1 tablet (5 mg total) by mouth daily. 30 tablet 11   ascorbic acid (VITAMIN C) 500 MG tablet Take 500 mg by mouth daily.     Azelaic Acid 15 % gel Apply to face every morning 50 g 3   cetirizine-pseudoephedrine (ZYRTEC-D) 5-120 MG tablet Take 1 tablet by mouth 2 (two) times daily. 60 tablet 4   Cholecalciferol 25 MCG (1000  UT) tablet Take 1 tablet (1,000 Units total) by mouth daily. 90 tablet 4   clobetasol cream (TEMOVATE) 0.05 % Apply 1 application topically 2 (two) times daily. Apply to affected area 30 g 2   cromolyn (OPTICROM) 4 % ophthalmic solution Place 1 drop into both eyes 4 (four) times daily. 10 mL 4   cyanocobalamin (VITAMIN B12) 1000 MCG tablet Take 1,000 mcg by mouth daily.     diltiazem 2 % GEL  Apply 1 Application topically 3 (three) times daily. Inside the rectum as directed. 30 g 0   ELIDEL 1 % cream Apply to red scaly areas on the face BID for seb derm 30 g 4   hydrALAZINE (APRESOLINE) 25 MG tablet Take 1 tablet (25 mg total) by mouth 2 (two) times daily. 120 tablet 1   hydrochlorothiazide (HYDRODIURIL) 12.5 MG tablet Take 12.5 mg by mouth daily.     Ivermectin (SOOLANTRA) 1 % CREA Apply 1 Application topically at bedtime. 45 g 2   levothyroxine (SYNTHROID) 112 MCG tablet Take 1 tablet (112 mcg total) by mouth daily. 90 tablet 3   omeprazole (PRILOSEC) 40 MG capsule Take 1 capsule (40 mg total) by mouth daily. 30 capsule 12   potassium chloride SA (KLOR-CON M) 20 MEQ tablet Take 1 tablet (20 mEq total) by mouth daily. 30 tablet 3   tadalafil, PAH, (ADCIRCA) 20 MG tablet Take 2 tablets (40 mg total) by mouth daily. NDC's covered by plan: 28413244010, 27253664403, [id number]     traMADol (ULTRAM) 50 MG tablet Take 50 mg by mouth every 8 (eight) hours as needed.     valACYclovir (VALTREX) 1000 MG tablet TAKE 1 TABLET BY MOUTH EVERY DAY (INS ONLY COVERING 34 DAYS) 90 tablet 4   fluticasone (FLONASE) 50 MCG/ACT nasal spray Place 2 sprays into both nostrils daily. (Patient taking differently: Place 2 sprays into both nostrils as needed for allergies.) 16 g 2   furosemide (LASIX) 40 MG tablet Take 1 tablet (40 mg total) by mouth daily. 30 tablet 3   No current facility-administered medications for this encounter.    Allergies  Allergen Reactions   Lisinopril Other (See Comments)    cough cough     PHYSICAL EXAM: BP 120/86   Pulse 100   Ht 5\' 3"  (1.6 m)   Wt 83.4 kg (183 lb 12.8 oz)   LMP  (LMP Unknown)   BMI 32.56 kg/m  General: NAD Neck: JVP 8 cm with HJR, no thyromegaly or thyroid nodule.  Lungs: Clear to auscultation bilaterally with normal respiratory effort. CV: Nondisplaced PMI.  Heart regular S1/S2, no S3/S4, no murmur.  Trace ankle edema.  No carotid bruit.  Normal  pedal pulses.  Abdomen: Soft, nontender, no hepatosplenomegaly, mild distention.  Skin: Intact without lesions or rashes.  Neurologic: Alert and oriented x 3.  Psych: Normal affect. Extremities: No clubbing or cyanosis.  HEENT: Normal.    ASSESSMENT & PLAN:  1. Pulmonary hypertension/RV failure: Echo in 10/24 showed EF 50-55%; RV was poorly visualized but there was a D-shaped interventricular septum and concern for RV dilation/dysfunction.  RHC/LHC in 10/24 showed no CAD; mean RA 11, PA 76/38 mean 52, mean PWP 10, PVR 7.3 WU and CI 3.04.  PE CT 10/24 showed no acute PE and no parenchymal lung disease.  V/Q scan 10/24 showed no chronic PE.  PFTs in 10/24 were normal (including, surprisingly, DLCO).  Rheumatology serologies and HIV were negative.  Sleep study showed mild OSA.  No FH of pulmonary  hypertension.  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 She is mildly volume overloaded on exam.  Good 6 minute walk today.  NYHA class II-III, improved.  - Increase Lasix to 40 mg daily.  BMET/BNP today, BMET in 10 days.   - Continue tadalafil 40 mg daily, she feels like this has helped.  - Continue ambrisentan 5 mg daily. She feels like ambrisentan is making her retain fluid (and is mildly volume overloaded), so I will not increase it until volume is better controlled.  - Pulmonary has sent Tyvaso inhaler to her house, but she does not want to take multiple inhaler puffs 4 times/day.  We discussed use of DPI when she tolerates a high enough does, but the qid dosing still concerns her.  She would rather take a pill bid, so I will set her up for selexipag starting at 200 mcg bid with slow titration upwards.  I discussed potential side effects with her.  There was some concern about GI bleeding risk given her diverticular disease by CT (has never had active bleeding).  However, selexipag does NOT increase her bleeding risk based on the particular receptors it engages.  -  Eventual sotatercept.   2. HTN: BP controlled, she is only taking hydralazine 25 mg bid.  3. Fibromyalgia: With diffuse pain.  Evaluation has not found a specific rheumatologic diagnosis.   She will followup with me in 6 wks.   Marca Ancona 02/26/2023

## 2023-02-26 NOTE — Patient Instructions (Addendum)
Great to see you today!!!  Medication Changes:  Increase Furosemide to 40 mg Daily  START Klor-Con 20 meq Daily  START Uptravi, this will come from a specialty pharmacy and nurse will work with you on starting and increasing this medication  Lab Work:  Labs done today, your results will be available in MyChart, we will contact you for abnormal readings.  Your physician recommends that you return for lab work in: 1-2 weeks   Follow-Up in: 6 weeks   At the Advanced [facility] Failure Clinic, you and your health needs are our priority. We have a designated team specialized in the treatment of Heart Failure. This Care Team includes your primary Heart Failure Specialized Cardiologist (physician), Advanced Practice Providers (APPs- Physician Assistants and Nurse Practitioners), and Pharmacist who all work together to provide you with the care you need, when you need it.   You may see any of the following providers on your designated Care Team at your next follow up:  Dr. Arvilla Meres Dr. Marca Ancona Dr. Dorthula Nettles Dr. Theresia Bough Tonye Becket, NP Robbie Lis, [place] [facility] [place], [place] Brynda Peon, NP [place] Lee, NP Karle Plumber, PharmD   Please be sure to bring in all your medications bottles to every appointment.   Need to Contact us:  If you have any questions or concerns before your next appointment please send [place] a message through [place] or call our office at [phone].    TO LEAVE A MESSAGE FOR THE NURSE SELECT OPTION 2, PLEASE LEAVE A MESSAGE INCLUDING: YOUR NAME DATE OF BIRTH CALL BACK NUMBER REASON FOR CALL**this is important as we prioritize the call backs  YOU WILL RECEIVE A CALL BACK THE SAME DAY AS LONG AS YOU CALL BEFORE 4:00 PM

## 2023-02-26 NOTE — Progress Notes (Signed)
6 Min Walk Test Completed  Pt ambulated 1592ft (457.89m) O2 Sat ranged 93-90 on N/A oxygen HR ranged 116-108

## 2023-02-27 ENCOUNTER — Other Ambulatory Visit (HOSPITAL_COMMUNITY): Payer: Self-pay

## 2023-03-01 DIAGNOSIS — Z7689 Persons encountering health services in other specified circumstances: Secondary | ICD-10-CM | POA: Diagnosis not present

## 2023-03-02 ENCOUNTER — Telehealth (HOSPITAL_COMMUNITY): Payer: Self-pay

## 2023-03-02 ENCOUNTER — Other Ambulatory Visit (HOSPITAL_COMMUNITY): Payer: Self-pay

## 2023-03-02 NOTE — Telephone Encounter (Signed)
Advanced Heart Failure Patient Advocate Encounter  Enrollment and prescription form faxed, along with med list, prior auth approval, and insurance card, to [place] on 03/02/2023. Forms attached to patient chart.  Burnell Blanks, CPhT Rx Patient Advocate Phone: [phone]

## 2023-03-02 NOTE — Telephone Encounter (Signed)
Advanced Heart Failure Patient Advocate Encounter  Prior authorization for Tonya Odonnell has been submitted and approved. Test billing returns $4 for 30 day supply.  Key: [id number] Effective: 02/27/2023 to 02/27/2024  Burnell Blanks, CPhT Rx Patient Advocate Phone: [phone]

## 2023-03-03 ENCOUNTER — Other Ambulatory Visit: Payer: Self-pay

## 2023-03-03 DIAGNOSIS — Z7689 Persons encountering health services in other specified circumstances: Secondary | ICD-10-CM | POA: Diagnosis not present

## 2023-03-05 ENCOUNTER — Other Ambulatory Visit (HOSPITAL_COMMUNITY): Payer: Self-pay

## 2023-03-05 ENCOUNTER — Other Ambulatory Visit: Payer: Self-pay

## 2023-03-05 NOTE — Progress Notes (Addendum)
Specialty Pharmacy Refill Coordination Note  Tonya Odonnell is a 57 y.o. female contacted today regarding refills of specialty medication(s) Ambrisentan [name]   Patient requested Delivery   Delivery date: 03/10/23   Verified address: [address] [name] [address]   Medication will be filled on 03/09/23.   REMS- Loopback information   Transation ID# [id number] (RX# + fill #) MD rems [id number] PT rems [id number] Manufacturer: [name] = [name] exp 03/16/2023

## 2023-03-06 ENCOUNTER — Other Ambulatory Visit (HOSPITAL_COMMUNITY): Payer: Self-pay

## 2023-03-06 DIAGNOSIS — Z7689 Persons encountering health services in other specified circumstances: Secondary | ICD-10-CM | POA: Diagnosis not present

## 2023-03-08 DIAGNOSIS — Z7689 Persons encountering health services in other specified circumstances: Secondary | ICD-10-CM | POA: Diagnosis not present

## 2023-03-09 ENCOUNTER — Other Ambulatory Visit: Payer: Self-pay

## 2023-03-09 ENCOUNTER — Other Ambulatory Visit (HOSPITAL_COMMUNITY): Payer: Self-pay

## 2023-03-09 DIAGNOSIS — Z7689 Persons encountering health services in other specified circumstances: Secondary | ICD-10-CM | POA: Diagnosis not present

## 2023-03-10 DIAGNOSIS — Z7689 Persons encountering health services in other specified circumstances: Secondary | ICD-10-CM | POA: Diagnosis not present

## 2023-03-11 ENCOUNTER — Other Ambulatory Visit: Payer: Self-pay | Admitting: Nurse Practitioner

## 2023-03-11 DIAGNOSIS — Z419 Encounter for procedure for purposes other than remedying health state, unspecified: Secondary | ICD-10-CM | POA: Diagnosis not present

## 2023-03-12 ENCOUNTER — Other Ambulatory Visit (HOSPITAL_COMMUNITY): Payer: Self-pay | Admitting: Cardiology

## 2023-03-12 ENCOUNTER — Encounter (HOSPITAL_COMMUNITY): Payer: Self-pay

## 2023-03-12 ENCOUNTER — Other Ambulatory Visit (HOSPITAL_COMMUNITY): Payer: Self-pay

## 2023-03-12 ENCOUNTER — Ambulatory Visit (HOSPITAL_COMMUNITY)
Admission: RE | Admit: 2023-03-12 | Discharge: 2023-03-12 | Disposition: A | Discharge status: Home / Self Care | Payer: Medicaid Other | Source: Ambulatory Visit | Attending: Cardiology | Admitting: Cardiology

## 2023-03-12 ENCOUNTER — Other Ambulatory Visit (HOSPITAL_COMMUNITY): Payer: Self-pay | Admitting: Pharmacist

## 2023-03-12 DIAGNOSIS — Z7689 Persons encountering health services in other specified circumstances: Secondary | ICD-10-CM | POA: Diagnosis not present

## 2023-03-12 DIAGNOSIS — I272 Pulmonary hypertension, unspecified: Secondary | ICD-10-CM | POA: Diagnosis not present

## 2023-03-12 LAB — BASIC METABOLIC PANEL
Anion gap: 13 (ref 5–15)
BUN: 16 mg/dL (ref 6–20)
CO2: 23 mmol/L (ref 22–32)
Calcium: 9.3 mg/dL (ref 8.9–10.3)
Chloride: 102 mmol/L (ref 98–111)
Creatinine, Ser: 0.73 mg/dL (ref 0.44–1.00)
GFR, Estimated: >60 mL/min (ref >60)
Glucose, Bld: 119 mg/dL — ABNORMAL HIGH (ref 70–99)
Potassium: 3.4 mmol/L — ABNORMAL LOW (ref 3.5–5.1)
Sodium: 138 mmol/L (ref 135–145)

## 2023-03-12 MED ORDER — HYDRALAZINE HCL 25 MG PO TABS: 25.0000 mg | ORAL_TABLET | Freq: Two times a day (BID) | ORAL | 3 refills | Status: DC

## 2023-03-12 NOTE — Telephone Encounter (Signed)
 Error - will attach to prev encounter

## 2023-03-12 NOTE — Telephone Encounter (Signed)
 Opened in error

## 2023-03-12 NOTE — Telephone Encounter (Signed)
 Secondary prior auth submitted for Uptravi  200 mcg tablets. PA has been approved. Test billing returns refill too soon rejection; unable to confirm copay at this time.  Uptravi  200 MCG CMM Key [id number] Effective 03/06/2023 to 02/27/2024 Determination letter has been added to patient chart.

## 2023-03-13 ENCOUNTER — Telehealth: Payer: Self-pay

## 2023-03-13 ENCOUNTER — Other Ambulatory Visit: Payer: Self-pay | Admitting: Nurse Practitioner

## 2023-03-13 MED ORDER — HYDRALAZINE HCL 25 MG PO TABS: 25.0000 mg | ORAL_TABLET | Freq: Two times a day (BID) | ORAL | 3 refills | Status: DC

## 2023-03-13 NOTE — Telephone Encounter (Signed)
 Patient is scheduled for 3 month f/u on 03/19/23.  Never seen in clinic but was consulted on 12/11/22 during admission and cath was performed.  Has been seen by [facility] Cardiology and currently seen by heart failure clinic.    Patient is unsure of need for general cardiology evaluation.  States when she saw [facility] Cardiology she was told there was nothing she needed from them but needs to see heart failure clinic.  Told patient I would confirm with provider need for visit.  Does patient need to keep the 03/19/23 appointment with you?

## 2023-03-13 NOTE — Telephone Encounter (Signed)
 According to her records we saw her in the hospital then hospital follow up was with [facility] and heart failure. There is no need for her to be followed by us  for general cardiology but she will need to continue to follow up with Advanced Heart Failure Clinic. Thanks, [employer]

## 2023-03-13 NOTE — Telephone Encounter (Signed)
 Patient informed no need to f/u with general cardiology but continue f/u with heart failure clinic.  Patient agrees and has seen heart failure clinic since hospital stay and has f/u already scheduled.  Appt cancelled.

## 2023-03-14 DIAGNOSIS — Z7689 Persons encountering health services in other specified circumstances: Secondary | ICD-10-CM | POA: Diagnosis not present

## 2023-03-16 ENCOUNTER — Ambulatory Visit: Payer: Medicaid Other | Admitting: Dermatology

## 2023-03-16 DIAGNOSIS — D2239 Melanocytic nevi of other parts of face: Secondary | ICD-10-CM | POA: Diagnosis not present

## 2023-03-16 DIAGNOSIS — L649 Androgenic alopecia, unspecified: Secondary | ICD-10-CM

## 2023-03-16 DIAGNOSIS — L719 Rosacea, unspecified: Secondary | ICD-10-CM | POA: Diagnosis not present

## 2023-03-16 DIAGNOSIS — L219 Seborrheic dermatitis, unspecified: Secondary | ICD-10-CM

## 2023-03-16 DIAGNOSIS — Z7689 Persons encountering health services in other specified circumstances: Secondary | ICD-10-CM | POA: Diagnosis not present

## 2023-03-16 DIAGNOSIS — Z7189 Other specified counseling: Secondary | ICD-10-CM

## 2023-03-16 DIAGNOSIS — L738 Other specified follicular disorders: Secondary | ICD-10-CM

## 2023-03-16 MED ORDER — DOXYCYCLINE 40 MG PO CPDR: 40.0000 mg | DELAYED_RELEASE_CAPSULE | Freq: Every day | ORAL | 5 refills | Status: DC

## 2023-03-16 MED ORDER — AZELAIC ACID 15 % EX GEL: CUTANEOUS | 5 refills | Status: AC

## 2023-03-16 MED ORDER — IVERMECTIN 1 % EX CREA: 1.0000 | TOPICAL_CREAM | Freq: Every day | CUTANEOUS | 5 refills | Status: AC

## 2023-03-16 MED ORDER — ELIDEL 1 % EX CREA: TOPICAL_CREAM | CUTANEOUS | 4 refills | Status: AC

## 2023-03-16 NOTE — Patient Instructions (Addendum)
 Counseling for BBL / IPL / Laser and Coordination of Care Discussed the treatment option of Broad Band Light (BBL) /Intense Pulsed Light (IPL)/ Laser for skin discoloration, including brown spots and redness.  Typically we recommend at least 1-3 treatment sessions about 5-8 weeks apart for best results.  Cannot have tanned skin when BBL performed, and regular use of sunscreen/photoprotection is advised after the procedure to help maintain results. The patient's condition may also require "maintenance treatments" in the future.  The fee for BBL / laser treatments is $350 per treatment session for the whole face.  A fee can be quoted for other parts of the body.  Insurance typically does not pay for BBL/laser treatments and therefore the fee is an out-of-pocket cost. Recommend prophylactic valtrex treatment. Once scheduled for procedure, will send Rx in prior to patient's appointment.     Due to recent changes in healthcare laws, you may see results of your pathology and/or laboratory studies on MyChart before the doctors have had a chance to review them. We understand that in some cases there may be results that are confusing or concerning to you. Please understand that not all results are received at the same time and often the doctors may need to interpret multiple results in order to provide you with the best plan of care or course of treatment. Therefore, we ask that you please give [place] 2 business days to thoroughly review all your results before contacting the office for clarification. Should we see a critical lab result, you will be contacted sooner.   If You Need Anything After Your Visit  If you have any questions or concerns for your doctor, please call our main line at [phone] and press option 4 to reach your doctor's medical assistant. If no one answers, please leave a voicemail as directed and we will return your call as soon as possible. Messages left after 4 pm will be answered the  following business day.   You may also send [place] a message via MyChart. We typically respond to MyChart messages within 1-2 business days.  For prescription refills, please ask your pharmacy to contact our office. Our fax number is [phone].  If you have an urgent issue when the clinic is closed that cannot wait until the next business day, you can page your doctor at the number below.    Please note that while we do our best to be available for urgent issues outside of office hours, we are not available 24/7.   If you have an urgent issue and are unable to reach [place], you may choose to seek medical care at your doctor's office, retail clinic, urgent care center, or emergency room.  If you have a medical emergency, please immediately call 911 or go to the emergency department.  Pager Numbers  - Dr. Gwen Pounds: [phone]  - Dr. Roseanne Reno: [phone]  - Dr. Katrinka Blazing: [phone]   In the event of inclement weather, please call our main line at [phone] for an update on the status of any delays or closures.  Dermatology Medication Tips: Please keep the boxes that topical medications come in in order to help keep track of the instructions about where and how to use these. Pharmacies typically print the medication instructions only on the boxes and not directly on the medication tubes.   If your medication is too expensive, please contact our office at [phone] option 4 or send [place] a message through MyChart.   We are unable to tell what  your co-pay for medications will be in advance as this is different depending on your insurance coverage. However, we may be able to find a substitute medication at lower cost or fill out paperwork to get insurance to cover a needed medication.   If a prior authorization is required to get your medication covered by your insurance company, please allow [place] 1-2 business days to complete this process.  Drug prices often vary depending on where the  prescription is filled and some pharmacies may offer cheaper prices.  The website www.goodrx.com contains coupons for medications through different pharmacies. The prices here do not account for what the cost may be with help from insurance (it may be cheaper with your insurance), but the website can give you the price if you did not use any insurance.  - You can print the associated coupon and take it with your prescription to the pharmacy.  - You may also stop by our office during regular business hours and pick up a GoodRx coupon card.  - If you need your prescription sent electronically to a different pharmacy, notify our office through [facility] or by phone at [phone] option 4.     Si Usted Necesita Algo Despus de Su Visita  Tambin puede enviarnos un mensaje a travs de Clinical cytogeneticist. Por lo general respondemos a los mensajes de MyChart en el transcurso de 1 a 2 das hbiles.  Para renovar recetas, por favor pida a su farmacia que se ponga en contacto con nuestra oficina. Annie Sable de fax es [place] [phone].  Si tiene un asunto urgente cuando la clnica est cerrada y que no puede esperar hasta el siguiente da hbil, puede llamar/localizar a su doctor(a) al nmero que aparece a continuacin.   Por favor, tenga en cuenta que aunque hacemos todo lo posible para estar disponibles para asuntos urgentes fuera del horario de [place], no estamos disponibles las 24 horas del da, los 7 [address]  [address] de la [place].   Si tiene un problema urgente y no puede comunicarse con nosotros, puede optar por buscar atencin mdica  en el consultorio de su doctor(a), en una clnica privada, en un centro de atencin urgente o en una sala de emergencias.  Si tiene Engineer, drilling, por favor llame inmediatamente al 911 o vaya a la sala de emergencias.  Nmeros de bper  - Dr. Gwen Pounds: [phone]  - Dra. Roseanne Reno: [phone]  - Dr. Katrinka Blazing: [phone]   En caso de inclemencias del  tiempo, por favor llame a Lacy Duverney principal al [phone] para una actualizacin sobre el [place] de cualquier retraso o cierre.  Consejos para la medicacin en dermatologa: Por favor, guarde las cajas en las que vienen los medicamentos de uso tpico para ayudarle a seguir las instrucciones sobre dnde y cmo usarlos. Las farmacias generalmente imprimen las instrucciones del medicamento slo en las cajas y no directamente en los tubos del [place].   Si su medicamento es muy caro, por favor, pngase en contacto con Rolm Gala llamando al [phone] y presione la opcin 4 o envenos un mensaje a travs de Clinical cytogeneticist.   No podemos decirle cul ser su copago por los medicamentos por adelantado ya que esto es diferente dependiendo de la cobertura de su seguro. Sin embargo, es posible que podamos encontrar un medicamento sustituto a Audiological scientist un formulario para que el seguro cubra el medicamento que se considera necesario.   Si se requiere una autorizacin previa para que su compaa de seguros [place] su  medicamento, por favor permtanos de 1 a 2 das hbiles para completar [address]  Los precios de los medicamentos varan con frecuencia dependiendo del Environmental consultant de dnde se surte la receta y alguna farmacias pueden ofrecer precios ms baratos.  El sitio web www.goodrx.com tiene cupones para medicamentos de Health and safety inspector. Los precios aqu no tienen en cuenta lo que podra costar con la ayuda del seguro (puede ser ms barato con su seguro), pero el sitio web puede darle el precio si no utiliz Tourist information centre manager.  - Puede imprimir el cupn correspondiente y llevarlo con su receta a la farmacia.  - Tambin puede pasar por nuestra oficina durante el horario de atencin regular y Education officer, museum una tarjeta de cupones de GoodRx.  - Si necesita que su receta se enve electrnicamente a una farmacia diferente, informe a nuestra oficina a travs de MyChart de [place] o por telfono  llamando al [phone] y presione la opcin 4.

## 2023-03-16 NOTE — Progress Notes (Signed)
 Follow-Up Visit   Subjective  Tonya Odonnell is a 58 y.o. female who presents for the following: 6 month follow-up. Rosacea is about the same, using azelaic acid  in the morning and ivermectin  cream sometimes at night. She has new growths in her nose she would like checked/removed. Patient is not treating seb derm of the face and ears. She gets facial redness and flaking from all her new medications. Alopecia has improved some, not currently treating. Ferritin is improved now. She was on minoxidil  in the past, not taking now due to interactions with other heart meds. Patient was hospitalized in September for pulmonary hypertension causing heart failure. She has bumps on the face to check.  The following portions of the chart were reviewed this encounter and updated as appropriate: medications, allergies, medical history  Review of Systems:  No other skin or systemic complaints except as noted in HPI or Assessment and Plan.  Objective  Well appearing patient in no apparent distress; mood and affect are within normal limits.  A focused examination was performed of the following areas: Face, scalp  Relevant physical exam findings are noted in the Assessment and Plan.    Assessment & Plan  ROSACEA Exam Cheeks, nose, forehead erythema with telangiectasias; few inflammatory papules at chin.   Chronic and persistent condition with duration or expected duration over one year. Condition is bothersome/symptomatic for patient. Currently flared.  Rosacea is a chronic progressive skin condition usually affecting the face of adults, causing redness and/or acne bumps. It is treatable but not curable. It sometimes affects the eyes (ocular rosacea) as well. It may respond to topical and/or systemic medication and can flare with stress, sun exposure, alcohol, exercise, topical steroids (including hydrocortisone /cortisone 10) and some foods.  Daily application of broad spectrum spf 30+ sunscreen to face is  recommended to reduce flares.  Patient denies grittiness of the eyes  Treatment Plan Start doxycycline  40 MG take 1 po every day with food dsp #30 5Rf. If not covered, will send in 20 MG 2 po every day. Patient will confirm ok to take with cardiologist.  Continue Azelaic acid  to face every morning 5Rf. Continue Ivermectin  cream at bedtime 5Rf. Wear sunscreen daily. Discussed BBL for persistent facial redness   Doxycycline  should be taken with food to prevent nausea. Do not lay down for 30 minutes after taking. Be cautious with sun exposure and use good sun protection while on this medication. Pregnant women should not take this medication.   Counseling for BBL / IPL / Laser and Coordination of Care Discussed the treatment option of Broad Band Light (BBL) /Intense Pulsed Light (IPL)/ Laser for skin discoloration, including brown spots and redness.  Typically we recommend at least 1-3 treatment sessions about 5-8 weeks apart for best results.  Cannot have tanned skin when BBL performed, and regular use of sunscreen/photoprotection is advised after the procedure to help maintain results. The patient's condition may also require maintenance treatments in the future.  The fee for BBL / laser treatments is $350 per treatment session for the whole face.  A fee can be quoted for other parts of the body.  Insurance typically does not pay for BBL/laser treatments and therefore the fee is an out-of-pocket cost. Recommend prophylactic valtrex  treatment. Once scheduled for procedure, will send Rx in prior to patient's appointment.    SEBORRHEIC DERMATITIS Exam: Pink patches with greasy scale at ears and face.  Chronic and persistent condition with duration or expected duration over one year. Condition  is bothersome/symptomatic for patient. Currently flared.  Seborrheic Dermatitis is a chronic persistent rash characterized by pinkness and scaling most commonly of the mid face but also can occur on the  scalp (dandruff), ears; mid chest, mid back and groin.  It tends to be exacerbated by stress and cooler weather.  People who have neurologic disease may experience new onset or exacerbation of existing seborrheic dermatitis.  The condition is not curable but treatable and can be controlled.  Treatment Plan: Continue Elidel  Cream Apply 1-2 times a day to pink, scaly rash on face/ears dsp 30g 4Rf.  Fibrous Papule vs Sebaceous Hyperplasia - R nasal ala 2.55mm flesh yellow papule, similar at inf nasal tip without features suspicious for malignancy on dermoscopy - Benign-appearing.  Observation.  Call clinic for new or changing lesions.  - Discussed shave removal vs ED (not covered)  Sebaceous Hyperplasia - Small yellow papules with a central dell at forehead - Benign-appearing - Observe. Call for changes. Discussed cosmetic procedure ED, noncovered.  $60 for 1st lesion and $15 for each additional lesion if done on the same day.  Maximum charge $350.  One touch-up treatment included no charge. Discussed risks of treatment including dyspigmentation, small scar, and/or recurrence. Recommend daily broad spectrum sunscreen SPF 30+/photoprotection to treated areas once healed.  ANDROGENETIC ALOPECIA (FEMALE PATTERN HAIR LOSS) Exam: mild diffuse thinning of the crown and widening of the midline part with retention of the frontal hairline, Improved compared to photos 03/06/2021  Chronic and persistent condition with duration or expected duration over one year. Condition is improving with treatment but not currently at goal. Telogen effluvium has improved.  Female Androgenic Alopecia is a chronic condition related to genetics and/or hormonal changes.  In women androgenetic alopecia is commonly associated with menopause but may occur any time after puberty.  It causes hair thinning primarily on the crown with widening of the part and temporal hairline recession.  Can use OTC Rogaine  (minoxidil ) 5%  solution/foam as directed.  Oral treatments in female patients who have no contraindication may include : - Low dose oral minoxidil  1.25 - 5mg  daily - Spironolactone 50 - 100mg  bid - Finasteride 2.5 - 5 mg daily Adjunctive therapies include: - Low Level Laser Light Therapy (LLLT) - Platelet-rich plasma injections (PRP) - Hair Transplants or scalp reduction   Patient not able to take minoxidil  due to other medications she is taking.  Since she is improved, will just monitor for now.   SEBORRHEIC DERMATITIS   Related Medications ELIDEL  1 % cream Apply to red scaly areas on the face BID for seb derm   Return for ED to [facility] on face, cosmetic appt.  IAndrea Kerns, CMA, am acting as scribe for Rexene Rattler, MD .   Documentation: I have reviewed the above documentation for accuracy and completeness, and I agree with the above.  Rexene Rattler, MD

## 2023-03-16 NOTE — Telephone Encounter (Signed)
 Requested Prescriptions  Refused Prescriptions Disp Refills   hydrALAZINE  (APRESOLINE ) 25 MG tablet [Pharmacy Med Name: HYDRALAZINE  25 MG TABLET] 180 tablet 1    Sig: TAKE 1 TABLET BY MOUTH TWICE A DAY     Cardiovascular:  Vasodilators Failed - 03/16/2023  8:39 AM      Failed - HCT in normal range and within 360 days    HCT  Date Value Ref Range Status  12/31/2022 49.9 (H) 36.0 - 46.0 % Final   Hematocrit  Date Value Ref Range Status  12/22/2022 53.6 (H) 34.0 - 46.6 % Final         Failed - HGB in normal range and within 360 days    Hemoglobin  Date Value Ref Range Status  12/31/2022 16.7 (H) 12.0 - 15.0 g/dL Final  89/85/7975 82.2 (H) 11.1 - 15.9 g/dL Final         Failed - RBC in normal range and within 360 days    RBC  Date Value Ref Range Status  12/31/2022 5.89 (H) 3.87 - 5.11 MIL/uL Final         Passed - WBC in normal range and within 360 days    WBC  Date Value Ref Range Status  12/31/2022 9.4 4.0 - 10.5 K/uL Final         Passed - PLT in normal range and within 360 days    Platelets  Date Value Ref Range Status  12/31/2022 335 150 - 400 K/uL Final  12/22/2022 379 150 - 450 x10E3/uL Final         Passed - ANA Screen, Ifa, Serum in normal range and within 360 days    Anti Nuclear Antibody (ANA)  Date Value Ref Range Status  12/12/2022 Negative Negative Final    Comment:    (NOTE) Performed At: [facility] [address] [place], [place] [id number] Jennette Shorter MD Ey:[id number]          Passed - Last BP in normal range    BP Readings from Last 1 Encounters:  02/26/23 120/86         Passed - Valid encounter within last 12 months    Recent Outpatient Visits           1 month ago Pulmonary hypertension, moderate to severe (HCC)   [place] [facility] [place], Melanie T, NP   2 months ago Pulmonary hypertension, unspecified (HCC)   [place] [facility] [place], [place] T, NP   3 months ago SOB (shortness of  breath)   [place] [facility] [place], Melanie DASEN, NP   4 months ago Hashimoto's thyroiditis   [place] [facility] [place], [place] T, NP   4 months ago Chronic pain syndrome   [place] [facility] [place], Melanie DASEN, NP       Future Appointments             Today Jackquline Sawyer, MD [facility]   In 3 weeks Dartha Ernst, MD [facility]

## 2023-03-17 ENCOUNTER — Other Ambulatory Visit: Payer: Self-pay | Admitting: Dermatology

## 2023-03-19 ENCOUNTER — Ambulatory Visit: Payer: Medicaid Other | Admitting: Cardiology

## 2023-03-22 DIAGNOSIS — Z7689 Persons encountering health services in other specified circumstances: Secondary | ICD-10-CM | POA: Diagnosis not present

## 2023-03-23 ENCOUNTER — Other Ambulatory Visit (HOSPITAL_COMMUNITY): Payer: Self-pay

## 2023-03-24 DIAGNOSIS — Z7689 Persons encountering health services in other specified circumstances: Secondary | ICD-10-CM | POA: Diagnosis not present

## 2023-03-25 ENCOUNTER — Other Ambulatory Visit: Payer: Self-pay

## 2023-03-25 DIAGNOSIS — E063 Autoimmune thyroiditis: Secondary | ICD-10-CM

## 2023-03-26 ENCOUNTER — Emergency Department: Payer: Medicaid Other

## 2023-03-26 ENCOUNTER — Other Ambulatory Visit: Payer: Self-pay

## 2023-03-26 DIAGNOSIS — R1031 Right lower quadrant pain: Secondary | ICD-10-CM | POA: Diagnosis present

## 2023-03-26 DIAGNOSIS — R1084 Generalized abdominal pain: Secondary | ICD-10-CM | POA: Diagnosis not present

## 2023-03-26 DIAGNOSIS — K3580 Unspecified acute appendicitis: Secondary | ICD-10-CM | POA: Diagnosis not present

## 2023-03-26 DIAGNOSIS — I10 Essential (primary) hypertension: Secondary | ICD-10-CM | POA: Diagnosis not present

## 2023-03-26 DIAGNOSIS — Z743 Need for continuous supervision: Secondary | ICD-10-CM | POA: Diagnosis not present

## 2023-03-26 DIAGNOSIS — R1111 Vomiting without nausea: Secondary | ICD-10-CM | POA: Diagnosis not present

## 2023-03-26 NOTE — ED Triage Notes (Signed)
Pt BIB EMS with c/o ABD pain that started this evening. Pt reports n/v and SOB. Pt reports starting a new pulmonary HTN medication on Monday.

## 2023-03-27 ENCOUNTER — Other Ambulatory Visit: Payer: Self-pay

## 2023-03-27 ENCOUNTER — Encounter (HOSPITAL_COMMUNITY): Admission: EM | Disposition: A | Discharge status: Home / Self Care | Payer: Self-pay | Source: Home / Self Care

## 2023-03-27 ENCOUNTER — Emergency Department
Admission: EM | Admit: 2023-03-27 | Discharge: 2023-03-27 | Disposition: A | Discharge status: Home / Self Care | Payer: Medicaid Other | Attending: Student in an Organized Health Care Education/Training Program | Admitting: Student in an Organized Health Care Education/Training Program

## 2023-03-27 ENCOUNTER — Inpatient Hospital Stay (HOSPITAL_COMMUNITY): Payer: Medicaid Other | Admitting: Anesthesiology

## 2023-03-27 ENCOUNTER — Encounter (HOSPITAL_COMMUNITY): Payer: Self-pay

## 2023-03-27 ENCOUNTER — Other Ambulatory Visit: Payer: Medicaid Other

## 2023-03-27 ENCOUNTER — Inpatient Hospital Stay (HOSPITAL_COMMUNITY)
Admission: EM | Admit: 2023-03-27 | Discharge: 2023-03-29 | DRG: 399 | Disposition: A | Discharge status: Home / Self Care | Payer: Medicaid Other | Attending: Surgery | Admitting: Surgery

## 2023-03-27 ENCOUNTER — Emergency Department: Payer: Medicaid Other

## 2023-03-27 DIAGNOSIS — F419 Anxiety disorder, unspecified: Secondary | ICD-10-CM | POA: Diagnosis not present

## 2023-03-27 DIAGNOSIS — I50810 Right heart failure, unspecified: Secondary | ICD-10-CM | POA: Diagnosis not present

## 2023-03-27 DIAGNOSIS — Z7689 Persons encountering health services in other specified circumstances: Secondary | ICD-10-CM | POA: Diagnosis not present

## 2023-03-27 DIAGNOSIS — M797 Fibromyalgia: Secondary | ICD-10-CM | POA: Diagnosis present

## 2023-03-27 DIAGNOSIS — I2721 Secondary pulmonary arterial hypertension: Secondary | ICD-10-CM | POA: Diagnosis present

## 2023-03-27 DIAGNOSIS — K219 Gastro-esophageal reflux disease without esophagitis: Secondary | ICD-10-CM | POA: Diagnosis present

## 2023-03-27 DIAGNOSIS — Z888 Allergy status to other drugs, medicaments and biological substances status: Secondary | ICD-10-CM | POA: Diagnosis not present

## 2023-03-27 DIAGNOSIS — J029 Acute pharyngitis, unspecified: Secondary | ICD-10-CM | POA: Diagnosis not present

## 2023-03-27 DIAGNOSIS — E785 Hyperlipidemia, unspecified: Secondary | ICD-10-CM | POA: Diagnosis present

## 2023-03-27 DIAGNOSIS — K3580 Unspecified acute appendicitis: Secondary | ICD-10-CM

## 2023-03-27 DIAGNOSIS — Z79890 Hormone replacement therapy (postmenopausal): Secondary | ICD-10-CM

## 2023-03-27 DIAGNOSIS — R112 Nausea with vomiting, unspecified: Secondary | ICD-10-CM | POA: Diagnosis not present

## 2023-03-27 DIAGNOSIS — Z79899 Other long term (current) drug therapy: Secondary | ICD-10-CM

## 2023-03-27 DIAGNOSIS — I110 Hypertensive heart disease with heart failure: Secondary | ICD-10-CM | POA: Diagnosis present

## 2023-03-27 DIAGNOSIS — Z8249 Family history of ischemic heart disease and other diseases of the circulatory system: Secondary | ICD-10-CM

## 2023-03-27 DIAGNOSIS — E669 Obesity, unspecified: Secondary | ICD-10-CM | POA: Diagnosis not present

## 2023-03-27 DIAGNOSIS — I10 Essential (primary) hypertension: Secondary | ICD-10-CM | POA: Diagnosis not present

## 2023-03-27 DIAGNOSIS — Z6832 Body mass index (BMI) 32.0-32.9, adult: Secondary | ICD-10-CM | POA: Diagnosis not present

## 2023-03-27 DIAGNOSIS — R1031 Right lower quadrant pain: Secondary | ICD-10-CM | POA: Diagnosis not present

## 2023-03-27 DIAGNOSIS — K353 Acute appendicitis with localized peritonitis, without perforation or gangrene: Secondary | ICD-10-CM | POA: Diagnosis not present

## 2023-03-27 DIAGNOSIS — I272 Pulmonary hypertension, unspecified: Secondary | ICD-10-CM

## 2023-03-27 DIAGNOSIS — K37 Unspecified appendicitis: Secondary | ICD-10-CM

## 2023-03-27 DIAGNOSIS — Z9012 Acquired absence of left breast and nipple: Secondary | ICD-10-CM | POA: Diagnosis not present

## 2023-03-27 DIAGNOSIS — Z751 Person awaiting admission to adequate facility elsewhere: Secondary | ICD-10-CM | POA: Diagnosis not present

## 2023-03-27 DIAGNOSIS — D649 Anemia, unspecified: Secondary | ICD-10-CM | POA: Diagnosis not present

## 2023-03-27 DIAGNOSIS — D259 Leiomyoma of uterus, unspecified: Secondary | ICD-10-CM | POA: Diagnosis not present

## 2023-03-27 DIAGNOSIS — Z853 Personal history of malignant neoplasm of breast: Secondary | ICD-10-CM

## 2023-03-27 DIAGNOSIS — R109 Unspecified abdominal pain: Secondary | ICD-10-CM | POA: Diagnosis not present

## 2023-03-27 HISTORY — PX: LAPAROSCOPIC APPENDECTOMY: SHX408

## 2023-03-27 LAB — URINALYSIS, ROUTINE W REFLEX MICROSCOPIC
Bacteria, UA: NONE SEEN
Bilirubin Urine: NEGATIVE
Glucose, UA: NEGATIVE mg/dL
Hgb urine dipstick: NEGATIVE
Ketones, ur: 20 mg/dL — AB
Nitrite: NEGATIVE
Protein, ur: 30 mg/dL — AB
Specific Gravity, Urine: 1.028 (ref 1.005–1.030)
pH: 6 (ref 5.0–8.0)

## 2023-03-27 LAB — COMPREHENSIVE METABOLIC PANEL
ALT: 30 U/L (ref 0–44)
AST: 27 U/L (ref 15–41)
Albumin: 4.2 g/dL (ref 3.5–5.0)
Alkaline Phosphatase: 66 U/L (ref 38–126)
Anion gap: 12 (ref 5–15)
BUN: 21 mg/dL — ABNORMAL HIGH (ref 6–20)
CO2: 21 mmol/L — ABNORMAL LOW (ref 22–32)
Calcium: 9.2 mg/dL (ref 8.9–10.3)
Chloride: 102 mmol/L (ref 98–111)
Creatinine, Ser: 0.64 mg/dL (ref 0.44–1.00)
GFR, Estimated: >60 mL/min (ref >60)
Glucose, Bld: 151 mg/dL — ABNORMAL HIGH (ref 70–99)
Potassium: 3.5 mmol/L (ref 3.5–5.1)
Sodium: 135 mmol/L (ref 135–145)
Total Bilirubin: 0.9 mg/dL (ref 0.0–1.2)
Total Protein: 7 g/dL (ref 6.5–8.1)

## 2023-03-27 LAB — TROPONIN I (HIGH SENSITIVITY)
Troponin I (High Sensitivity): 5 ng/L (ref <18)
Troponin I (High Sensitivity): 6 ng/L (ref <18)

## 2023-03-27 LAB — CBC
HCT: 41.8 % (ref 36.0–46.0)
Hemoglobin: 14.3 g/dL (ref 12.0–15.0)
MCH: 29.2 pg (ref 26.0–34.0)
MCHC: 34.2 g/dL (ref 30.0–36.0)
MCV: 85.5 fL (ref 80.0–100.0)
Platelets: 312 10*3/uL (ref 150–400)
RBC: 4.89 MIL/uL (ref 3.87–5.11)
RDW: 13.3 % (ref 11.5–15.5)
WBC: 13 10*3/uL — ABNORMAL HIGH (ref 4.0–10.5)
nRBC: 0 % (ref 0.0–0.2)

## 2023-03-27 LAB — LIPASE, BLOOD: Lipase: 35 U/L (ref 11–51)

## 2023-03-27 LAB — BRAIN NATRIURETIC PEPTIDE: B Natriuretic Peptide: 26.9 pg/mL (ref 0.0–100.0)

## 2023-03-27 SURGERY — APPENDECTOMY, LAPAROSCOPIC
Anesthesia: General | Site: Abdomen

## 2023-03-27 MED ORDER — MORPHINE SULFATE (PF) 2 MG/ML IV SOLN
1.0000 mg | INTRAVENOUS | Status: DC | PRN
  Administered 2023-03-28: 1 mg via INTRAVENOUS
  Filled 2023-03-27: qty 1

## 2023-03-27 MED ORDER — LACTATED RINGERS IV SOLN: INTRAVENOUS | Status: DC | PRN

## 2023-03-27 MED ORDER — SODIUM CHLORIDE 0.9 % IV BOLUS
500.0000 mL | Freq: Once | INTRAVENOUS | Status: AC
  Administered 2023-03-27: 500 mL via INTRAVENOUS

## 2023-03-27 MED ORDER — DEXAMETHASONE SODIUM PHOSPHATE 10 MG/ML IJ SOLN
INTRAMUSCULAR | Status: DC | PRN
  Administered 2023-03-27: 10 mg via INTRAVENOUS

## 2023-03-27 MED ORDER — ACETAMINOPHEN 500 MG PO TABS
1000.0000 mg | ORAL_TABLET | Freq: Four times a day (QID) | ORAL | Status: DC
  Administered 2023-03-27 – 2023-03-29 (×6): 1000 mg via ORAL
  Filled 2023-03-27 (×6): qty 2

## 2023-03-27 MED ORDER — OXYCODONE HCL 5 MG PO TABS: 10.0000 mg | ORAL_TABLET | ORAL | Status: DC | PRN

## 2023-03-27 MED ORDER — SUCCINYLCHOLINE CHLORIDE 200 MG/10ML IV SOSY
PREFILLED_SYRINGE | INTRAVENOUS | Status: DC | PRN
  Administered 2023-03-27: 120 mg via INTRAVENOUS

## 2023-03-27 MED ORDER — OXYCODONE HCL 5 MG PO TABS: 5.0000 mg | ORAL_TABLET | ORAL | Status: DC | PRN

## 2023-03-27 MED ORDER — SUGAMMADEX SODIUM 200 MG/2ML IV SOLN
INTRAVENOUS | Status: DC | PRN
  Administered 2023-03-27: 300 mg via INTRAVENOUS

## 2023-03-27 MED ORDER — ROCURONIUM BROMIDE 100 MG/10ML IV SOLN
INTRAVENOUS | Status: DC | PRN
  Administered 2023-03-27: 60 mg via INTRAVENOUS

## 2023-03-27 MED ORDER — FENTANYL CITRATE (PF) 100 MCG/2ML IJ SOLN
INTRAMUSCULAR | Status: DC | PRN
  Administered 2023-03-27: 100 ug via INTRAVENOUS
  Administered 2023-03-27: 50 ug via INTRAVENOUS

## 2023-03-27 MED ORDER — SODIUM CHLORIDE 0.9 % IR SOLN
Status: DC | PRN
  Administered 2023-03-27: 1000 mL

## 2023-03-27 MED ORDER — MIDAZOLAM HCL 5 MG/5ML IJ SOLN
INTRAMUSCULAR | Status: DC | PRN
  Administered 2023-03-27 (×2): 2 mg via INTRAVENOUS

## 2023-03-27 MED ORDER — FENTANYL CITRATE (PF) 250 MCG/5ML IJ SOLN
INTRAMUSCULAR | Status: AC
  Filled 2023-03-27: qty 5

## 2023-03-27 MED ORDER — NOREPINEPHRINE 4 MG/250ML-% IV SOLN
INTRAVENOUS | Status: AC
  Filled 2023-03-27: qty 250

## 2023-03-27 MED ORDER — ONDANSETRON HCL 4 MG/2ML IJ SOLN
4.0000 mg | Freq: Once | INTRAMUSCULAR | Status: AC
  Administered 2023-03-27: 4 mg via INTRAVENOUS
  Filled 2023-03-27: qty 2

## 2023-03-27 MED ORDER — MIDAZOLAM HCL 2 MG/2ML IJ SOLN
INTRAMUSCULAR | Status: AC
  Filled 2023-03-27: qty 2

## 2023-03-27 MED ORDER — LIDOCAINE HCL (CARDIAC) PF 100 MG/5ML IV SOSY
PREFILLED_SYRINGE | INTRAVENOUS | Status: DC | PRN
  Administered 2023-03-27: 60 mg via INTRAVENOUS

## 2023-03-27 MED ORDER — BUPIVACAINE-EPINEPHRINE (PF) 0.25% -1:200000 IJ SOLN
INTRAMUSCULAR | Status: AC
Start: 2023-03-27 — End: ?
  Filled 2023-03-27: qty 30

## 2023-03-27 MED ORDER — CEFAZOLIN SODIUM-DEXTROSE 2-3 GM-%(50ML) IV SOLR
INTRAVENOUS | Status: DC | PRN
  Administered 2023-03-27: 2 g via INTRAVENOUS

## 2023-03-27 MED ORDER — METRONIDAZOLE 500 MG/100ML IV SOLN: 500.0000 mg | Freq: Once | INTRAVENOUS | Status: DC

## 2023-03-27 MED ORDER — SODIUM CHLORIDE 0.9 % IV SOLN: 2.0000 g | INTRAVENOUS | Status: AC

## 2023-03-27 MED ORDER — BUPIVACAINE-EPINEPHRINE 0.25% -1:200000 IJ SOLN
INTRAMUSCULAR | Status: DC | PRN
  Administered 2023-03-27: 30 mL

## 2023-03-27 MED ORDER — ETOMIDATE 2 MG/ML IV SOLN
INTRAVENOUS | Status: DC | PRN
  Administered 2023-03-27: 20 mg via INTRAVENOUS

## 2023-03-27 MED ORDER — METRONIDAZOLE 500 MG/100ML IV SOLN
500.0000 mg | INTRAVENOUS | Status: AC
  Filled 2023-03-27: qty 100

## 2023-03-27 MED ORDER — ONDANSETRON HCL 4 MG/2ML IJ SOLN: 4.0000 mg | INTRAMUSCULAR | Status: DC | PRN

## 2023-03-27 MED ORDER — NOREPINEPHRINE 4 MG/250ML-% IV SOLN
INTRAVENOUS | Status: DC | PRN
  Administered 2023-03-27: 2 ug/min via INTRAVENOUS

## 2023-03-27 MED ORDER — ACETAMINOPHEN 10 MG/ML IV SOLN
INTRAVENOUS | Status: AC
  Filled 2023-03-27: qty 100

## 2023-03-27 MED ORDER — IOHEXOL 300 MG/ML  SOLN
80.0000 mL | Freq: Once | INTRAMUSCULAR | Status: AC | PRN
  Administered 2023-03-27: 80 mL via INTRAVENOUS

## 2023-03-27 MED ORDER — FENTANYL CITRATE (PF) 100 MCG/2ML IJ SOLN
25.0000 ug | INTRAMUSCULAR | Status: DC | PRN
  Administered 2023-03-27: 25 ug via INTRAVENOUS

## 2023-03-27 MED ORDER — ONDANSETRON HCL 4 MG/2ML IJ SOLN
INTRAMUSCULAR | Status: DC | PRN
  Administered 2023-03-27: 4 mg via INTRAVENOUS

## 2023-03-27 MED ORDER — 0.9 % SODIUM CHLORIDE (POUR BTL) OPTIME
TOPICAL | Status: DC | PRN
  Administered 2023-03-27: 1000 mL

## 2023-03-27 MED ORDER — MORPHINE SULFATE (PF) 4 MG/ML IV SOLN
4.0000 mg | INTRAVENOUS | Status: DC | PRN
  Administered 2023-03-27: 4 mg via INTRAVENOUS
  Filled 2023-03-27: qty 1

## 2023-03-27 MED ORDER — PROPOFOL 10 MG/ML IV BOLUS
INTRAVENOUS | Status: AC
  Filled 2023-03-27: qty 20

## 2023-03-27 MED ORDER — PIPERACILLIN-TAZOBACTAM 3.375 G IVPB 30 MIN
3.3750 g | Freq: Once | INTRAVENOUS | Status: AC
  Administered 2023-03-27: 3.375 g via INTRAVENOUS
  Filled 2023-03-27: qty 50

## 2023-03-27 MED ORDER — FENTANYL CITRATE (PF) 100 MCG/2ML IJ SOLN
INTRAMUSCULAR | Status: AC
  Filled 2023-03-27: qty 2

## 2023-03-27 MED ORDER — VASOPRESSIN 20 UNIT/ML IV SOLN
INTRAVENOUS | Status: DC | PRN
  Administered 2023-03-27: 2 [IU] via INTRAVENOUS
  Administered 2023-03-27: 1 [IU] via INTRAVENOUS

## 2023-03-27 MED ORDER — ACETAMINOPHEN 10 MG/ML IV SOLN
1000.0000 mg | Freq: Once | INTRAVENOUS | Status: DC | PRN
  Administered 2023-03-27: 1000 mg via INTRAVENOUS

## 2023-03-27 SURGICAL SUPPLY — 44 items
APPLIER CLIP 5 13 M/L LIGAMAX5 (MISCELLANEOUS)
BAG COUNTER SPONGE SURGICOUNT (BAG) ×1 IMPLANT
BLADE CLIPPER SURG (BLADE) IMPLANT
CANISTER SUCT 3000ML PPV (MISCELLANEOUS) ×1 IMPLANT
CHLORAPREP W/TINT 26 (MISCELLANEOUS) ×1 IMPLANT
CLIP APPLIE 5 13 M/L LIGAMAX5 (MISCELLANEOUS) IMPLANT
COVER SURGICAL LIGHT HANDLE (MISCELLANEOUS) ×1 IMPLANT
DERMABOND ADVANCED .7 DNX12 (GAUZE/BANDAGES/DRESSINGS) ×1 IMPLANT
ELECT REM PT RETURN 9FT ADLT (ELECTROSURGICAL) ×1
ELECTRODE REM PT RTRN 9FT ADLT (ELECTROSURGICAL) ×1 IMPLANT
GLOVE BIO SURGEON STRL SZ7.5 (GLOVE) ×1 IMPLANT
GLOVE BIOGEL PI IND STRL 8 (GLOVE) ×1 IMPLANT
GOWN STRL REUS W/ TWL LRG LVL3 (GOWN DISPOSABLE) ×2 IMPLANT
GOWN STRL REUS W/ TWL XL LVL3 (GOWN DISPOSABLE) ×1 IMPLANT
GRASPER SUT TROCAR 14GX15 (MISCELLANEOUS) ×1 IMPLANT
IRRIG SUCT STRYKERFLOW 2 WTIP (MISCELLANEOUS) ×1
IRRIGATION SUCT STRKRFLW 2 WTP (MISCELLANEOUS) ×1 IMPLANT
KIT BASIN OR (CUSTOM PROCEDURE TRAY) ×1 IMPLANT
KIT TURNOVER KIT B (KITS) ×1 IMPLANT
NDL 22X1.5 STRL (OR ONLY) (MISCELLANEOUS) ×1 IMPLANT
NDL INSUFFLATION 14GA 120MM (NEEDLE) ×1 IMPLANT
NEEDLE 22X1.5 STRL (OR ONLY) (MISCELLANEOUS) ×1 IMPLANT
NEEDLE INSUFFLATION 14GA 120MM (NEEDLE) ×1 IMPLANT
NS IRRIG 1000ML POUR BTL (IV SOLUTION) ×1 IMPLANT
PAD ARMBOARD 7.5X6 YLW CONV (MISCELLANEOUS) ×2 IMPLANT
RELOAD STAPLE 60 2.6 WHT THN (STAPLE) ×1 IMPLANT
RELOAD STAPLER WHITE 60MM (STAPLE) ×1 IMPLANT
SCISSORS LAP 5X35 DISP (ENDOMECHANICALS) IMPLANT
SET TUBE SMOKE EVAC HIGH FLOW (TUBING) ×1 IMPLANT
SHEARS HARMONIC ACE PLUS 36CM (ENDOMECHANICALS) ×1 IMPLANT
SLEEVE Z-THREAD 5X100MM (TROCAR) ×1 IMPLANT
SPECIMEN JAR SMALL (MISCELLANEOUS) ×1 IMPLANT
STAPLER POWER ECHELON 60 WIDE (STAPLE) ×1 IMPLANT
STAPLER RELOAD WHITE 60MM (STAPLE) ×1
SUT MNCRL AB 4-0 PS2 18 (SUTURE) ×1 IMPLANT
SYS BAG RETRIEVAL 10MM (BASKET) ×1
SYSTEM BAG RETRIEVAL 10MM (BASKET) ×1 IMPLANT
TOWEL GREEN STERILE FF (TOWEL DISPOSABLE) ×1 IMPLANT
TRAY FOLEY W/BAG SLVR 16FR ST (SET/KITS/TRAYS/PACK) IMPLANT
TRAY LAPAROSCOPIC MC (CUSTOM PROCEDURE TRAY) ×1 IMPLANT
TROCAR Z THREAD OPTICAL 12X100 (TROCAR) ×1 IMPLANT
TROCAR Z-THREAD OPTICAL 5X100M (TROCAR) ×1 IMPLANT
WARMER LAPAROSCOPE (MISCELLANEOUS) ×1 IMPLANT
WATER STERILE IRR 1000ML POUR (IV SOLUTION) ×1 IMPLANT

## 2023-03-27 NOTE — Consult Note (Signed)
Tonya Odonnell Apr 05, 1965  [id number].    Requesting MD: Dr. Chaney Malling Chief Complaint/Reason for Consult: acute appendicitis  HPI:  This is a pleasant 58 yo female with a history of pulmonary HTN, RV failure, breast cancer, Hashimoto's, and fibromyalgia who started a new HF medication this past Monday.  She then developed some chills and sweats and thought it may be related to this new medication.  Yesterday around 1800 she developed epigastric/periumbilical abdominal pain that migrated to her lower abdomen along with some N/V.  She again thought this may be related to her new medication.  However, after Tylenol and pepto-bismol this did not improve.  Her pain was sharp like glass shards.  She called EMS and was taken to [facility] ED for evaluation.  She has been found to have uncomplicated appendicitis on CT scan with a WBC of 13K.  She currently complains of more SOB in the last day secondary to her abdominal pain.  Her O2 sats are normally 93-94%, but have been in the high 80s since last night.  She is currently on 3L [place].  She denies any chest pain and states recently she has been walking better and mobilizing better.  We have been asked to see her here at [facility] for further evaluation for her appendicitis.  ROS: ROS: see HPI  Family History  Problem Relation Age of Onset   Hypertension Mother    Leukemia Mother    Stomach cancer Father    Colon cancer Neg Hx    Colon polyps Neg Hx    Crohn's disease Neg Hx    Esophageal cancer Neg Hx    Rectal cancer Neg Hx     Past Medical History:  Diagnosis Date   Anemia    "USE TO HAVE IT,NOT NOW,2015,2016   Arthritis    HIPS,KNEES,HANDS   Breast cancer in female (HCC)    Cancer (HCC)    LEFT   Hyperlipidemia    Hypertension    Low iron    Neuromuscular disorder (HCC)    MUSCLE PAIN,FIBROMYALGIA   Osteopenia    Pulmonary HTN (HCC)    PVC (premature ventricular contraction)    Thyroid disease     Past Surgical History:  Procedure  Laterality Date   Left Ovarian removal Left    MASTECTOMY Left    "LYMPHNODES REMOVED   RIGHT/LEFT HEART CATH AND CORONARY ANGIOGRAPHY N/A 12/12/2022   Procedure: RIGHT/LEFT HEART CATH AND CORONARY ANGIOGRAPHY;  Surgeon: Iran Ouch, MD;  Location: [facility] INVASIVE CV LAB;  Service: Cardiovascular;  Laterality: N/A;    Social History:  reports that she has never smoked. She has never used smokeless tobacco. She reports that she does not drink alcohol and does not use drugs.  Allergies:  Allergies  Allergen Reactions   Lisinopril Other (See Comments)    cough cough     (Not in a hospital admission)    Physical Exam: Blood pressure 117/77, pulse (!) 112, temperature 99 F (37.2 C), temperature source Oral, resp. rate 19, SpO2 92%. General: pleasant, WD, WN white female who is laying in bed in NAD HEENT: head is normocephalic, atraumatic.  Sclera are noninjected.  PERRL.  Ears and nose without any masses or lesions. Haw[place] in place with 3L running.  Mouth is pink but dry. Heart: regular rhythm, but mildly tachy.  Normal s1,s2. No obvious murmurs, gallops, or rubs noted.  Lungs: CTAB, no wheezes, rhonchi, or rales noted.  Respiratory effort nonlabored on 3L [place]  Abd: soft, tender mostly in her lower central pelvis consistent with where appendix is on CT scan, ND, mildly obese, +BS, no masses, hernias, or organomegaly MS: all 4 extremities are symmetrical with no cyanosis, clubbing, but minimal trace edema noted. Skin: warm and dry with no masses, lesions, or rashes Psych: A&Ox3 with an appropriate affect.   Results for orders placed or performed during the hospital encounter of 03/27/23 (from the past 48 hours)  Troponin I (High Sensitivity)     Status: None   Collection Time: 03/27/23 12:03 AM  Result Value Ref Range   Troponin I (High Sensitivity) 6 <18 ng/L    Comment: (NOTE) Elevated high sensitivity troponin I (hsTnI) values and significant  changes across serial measurements  may suggest ACS but many other  chronic and acute conditions are known to elevate hsTnI results.  Refer to the "Links" section for chest pain algorithms and additional  guidance. Performed at [facility], [address] [place], [place] [address]   Lipase, blood     Status: None   Collection Time: 03/27/23 12:03 AM  Result Value Ref Range   Lipase 35 11 - 51 U/L    Comment: Performed at [facility], [address] [place], [place] [address]  Comprehensive metabolic panel     Status: Abnormal   Collection Time: 03/27/23 12:03 AM  Result Value Ref Range   Sodium 135 135 - 145 mmol/L   Potassium 3.5 3.5 - 5.1 mmol/L   Chloride 102 98 - 111 mmol/L   CO2 21 (L) 22 - 32 mmol/L   Glucose, Bld 151 (H) 70 - 99 mg/dL    Comment: Glucose reference range applies only to samples taken after fasting for at least 8 hours.   BUN 21 (H) 6 - 20 mg/dL   Creatinine, Ser 4.13 0.44 - 1.00 mg/dL   Calcium 9.2 8.9 - 24.4 mg/dL   Total Protein 7.0 6.5 - 8.1 g/dL   Albumin 4.2 3.5 - 5.0 g/dL   AST 27 15 - 41 U/L   ALT 30 0 - 44 U/L   Alkaline Phosphatase 66 38 - 126 U/L   Total Bilirubin 0.9 0.0 - 1.2 mg/dL   GFR, Estimated >01 >02 mL/min    Comment: (NOTE) Calculated using the CKD-EPI Creatinine Equation (2021)    Anion gap 12 5 - 15    Comment: Performed at [facility], [address] [place], [place] [address]  CBC     Status: Abnormal   Collection Time: 03/27/23 12:03 AM  Result Value Ref Range   WBC 13.0 (H) 4.0 - 10.5 K/uL   RBC 4.89 3.87 - 5.11 MIL/uL   Hemoglobin 14.3 12.0 - 15.0 g/dL   HCT 64.4 03.4 - 74.2 %   MCV 85.5 80.0 - 100.0 fL   MCH 29.2 26.0 - 34.0 pg   MCHC 34.2 30.0 - 36.0 g/dL   RDW 59.5 63.8 - 75.6 %   Platelets 312 150 - 400 K/uL   nRBC 0.0 0.0 - 0.2 %    Comment: Performed at [facility], [address] [place], [place] [address]  Urinalysis, Routine w reflex microscopic -Urine, Clean Catch     Status: Abnormal    Collection Time: 03/27/23  2:44 AM  Result Value Ref Range   Color, Urine YELLOW (A) YELLOW   APPearance CLEAR (A) CLEAR   Specific Gravity, Urine 1.028 1.005 - 1.030   pH 6.0 5.0 - 8.0   Glucose,  UA NEGATIVE NEGATIVE mg/dL   Hgb urine dipstick NEGATIVE NEGATIVE   Bilirubin Urine NEGATIVE NEGATIVE   Ketones, ur 20 (A) NEGATIVE mg/dL   Protein, ur 30 (A) NEGATIVE mg/dL   Nitrite NEGATIVE NEGATIVE   Leukocytes,Ua MODERATE (A) NEGATIVE   RBC / HPF 0-5 0 - 5 RBC/hpf   WBC, UA 11-20 0 - 5 WBC/hpf   Bacteria, UA NONE SEEN NONE SEEN   Squamous Epithelial / HPF 0-5 0 - 5 /HPF   Mucus PRESENT     Comment: Performed at [facility], [address] [place], [place] [address]  Troponin I (High Sensitivity)     Status: None   Collection Time: 03/27/23  2:44 AM  Result Value Ref Range   Troponin I (High Sensitivity) 5 <18 ng/L    Comment: (NOTE) Elevated high sensitivity troponin I (hsTnI) values and significant  changes across serial measurements may suggest ACS but many other  chronic and acute conditions are known to elevate hsTnI results.  Refer to the "Links" section for chest pain algorithms and additional  guidance. Performed at [facility], [address] [place], [place] [address]    CT ABDOMEN PELVIS W CONTRAST Result Date: 03/27/2023 CLINICAL DATA:  Acute right lower quadrant abdominal pain. EXAM: CT ABDOMEN AND PELVIS WITH CONTRAST TECHNIQUE: Multidetector CT imaging of the abdomen and pelvis was performed using the standard protocol following bolus administration of intravenous contrast. RADIATION DOSE REDUCTION: This exam was performed according to the departmental dose-optimization program which includes automated exposure control, adjustment of the mA and/or kV according to patient size and/or use of iterative reconstruction technique. CONTRAST:  80mL OMNIPAQUE IOHEXOL 300 MG/ML  SOLN COMPARISON:  December 15, 2022. FINDINGS: Lower chest: No acute abnormality.  Hepatobiliary: No focal liver abnormality is seen. No gallstones, gallbladder wall thickening, or biliary dilatation. Pancreas: Unremarkable. No pancreatic ductal dilatation or surrounding inflammatory changes. Spleen: Normal in size without focal abnormality. Adrenals/Urinary Tract: Adrenal glands are unremarkable. Kidneys are normal, without renal calculi, focal lesion, or hydronephrosis. Bladder is unremarkable. Stomach/Bowel: The stomach is unremarkable. There is no evidence of bowel obstruction. The appendix is enlarged and inflamed measuring 15 mm consistent with acute appendicitis. No definite abscess formation is noted. Vascular/Lymphatic: No significant vascular findings are present. No enlarged abdominal or pelvic lymph nodes. Reproductive: 2 cm posterior uterine fibroid is noted. No adnexal abnormality is noted. Other: No abdominal wall hernia or abnormality. No abdominopelvic ascites. Musculoskeletal: No acute or significant osseous findings. IMPRESSION: Findings consistent with acute appendicitis. No definite abscess formation is noted. Electronically Signed   By: Lupita Raider M.D.   On: 03/27/2023 10:53   DG Chest 2 View Result Date: 03/27/2023 CLINICAL DATA:  Abdominal pain with nausea, vomiting and shortness of breath. EXAM: CHEST - 2 VIEW COMPARISON:  December 23, 2022 FINDINGS: The heart size and mediastinal contours are within normal limits. Both lungs are clear. The visualized skeletal structures are unremarkable. IMPRESSION: No active cardiopulmonary disease. Electronically Signed   By: Aram Candela M.D.   On: 03/27/2023 00:26      Assessment/Plan Acute appendicitis The patient has been seen, examined, labs, vitals, chart, and imaging personally reviewed.  She appears to have acute uncomplicated appendicitis.  However, her situation is complicated by her pulmonary HTN/ right heart failure.  I have discussed her case with our anesthesia team here at [facility] who feels comfortable  putting her to sleep.  I have also discussed her case with Dr. Shirlee Latch and the  heart failure team who would like to evaluate her prior to any possible OR.  They are currently doing so, but have thought she will be higher risk, but still cleared to proceed.  I have discussed the risks/complications of lap appy vs conservative non-operative management with the patient.  We discussed that if she does not want the higher risk of going to the OR that abx treatment is an acceptable way to treat appendicitis, especially with no appendicolith present.  She is unsure at this time how she would like to proceed.  Will await final recs from HF team, but also rediscuss plans with the patient to help make a decision about how to proceed.  Continue NPO for now.  She received a dose of zosyn at [facility] ED around 1100.  I have written for Rocephin/Flagyl on call to OR if she chooses OR.  If not, we will need to order these scheduled for conservative management.   FEN - NPO/IVFs per HF team/primary service VTE - ok for chemical prophylaxis from our standpoint ID - zosyn, Rocephin/flagyl at next appointed time Admit - recommend admission to the medical service.  Pulmonary HTN RH failure Hashimoto's Hx of breast cancer, s/p mastectomy  Fibromyalgia   I reviewed nursing notes, ED provider notes, last 24 h vitals and pain scores, last 48 h intake and output, last 24 h labs and trends, last 24 h imaging results, and discussed case directly with EDP, [facility] physicians, HF team, and anesthesia .  Letha Cape, [facility] 03/27/2023, 3:38 PM Please see Amion for pager number during day hours 7:00am-4:30pm or 7:00am -11:30am on weekends

## 2023-03-27 NOTE — ED Triage Notes (Signed)
Pt BIB [facility] from [facility] d/t appendicitis & sent here for surgery (per [facility]). Pt c/o continued abd pain upon arrival, A/Ox4, 91% on 3L O2 via n/c, does not wear O2 at baseline. [facility] reports VSS except tachy on 118 bpm. Does have chills, temp is 98.6, Restricted Lt arm, Rt AC 20g PIV. [facility] reports she was 84% on RA for them, Hx of pulmonary HTN.

## 2023-03-27 NOTE — Consult Note (Addendum)
Advanced Heart Failure Team Consult Note   Primary Physician: Marjie Skiff, NP Cardiologist/PAH: Dr Shirlee Latch   Reason for Consultation: PreOp Clearance/ Pulmonary Hypertension   HPI:    Tonya Odonnell is seen today for evaluation of pulmonary hypertension at the request of Dr Roxan Hockey.   Mr Tonya Odonnell is a 58 year old with a history of pulmonary hypertension, RV failure, breast cancer, fibromyalgia,  hypothyroidism, and RV failure.   Admitted 10/24 at [facility] with progressive  dyspnea as well as pain.  Prior to that she was seen by [facility] rheumatology and diagnosed with fibromyalgia.  2 months prior to her hospitalization she reports having progressive exertional dyspnea with shortness of breath just walking to her mailbox.  2 weeks prior to admission this progressed to chest tightness both at rest and with exertion.  During her hospitalization, echocardiogram was done showing EF of 50 to 55% with D-shaped interventricular septum.  She underwent right and left heart catheterization that was significant for pulmonary hypertension with a PVR of 7.3 Wood units.  CTA chest during this admission showed no PE, no parenchymal lung disease.  PFTs in 10/24 were surprisingly normal.  V/Q scan in 10/24 showed no evidence for chronic PE.  She was subsequently started on tadalafil 20 mg and PH evaluation was started.   Sleep study showed mild OSA, pulmonary did not recommend CPAP.   Cath 12/2022 normal  coronaries,  PA 76/38 (52),  PVR 7.3,  CO 5.7, CI  3.  VQ no PE.  Started on tadalafil 20 mg daily. Sleep study mild OSA.  PAH Therapy: Tadalafil 40 mg daily and Ambrisentan 5 mg  daily.Of note she declined Tyvaso and preferred pills. She was prescribed selexipag in December and she was able to receive earlier this month.She felt like she has been able to walk a little more. Denies syncope/presyncope. Had her 1st dose 03/22/22.  Over the last couple of days she developed abdominal pain. Last dose of selexipag 1/16  morning dose.   Presented to [facility]  with abdominal pain. WBC 13K.  CT abd/pelvis  with sign of acure appendicitis. General Surgery consulted. High risk for anesthesia at [facility]. Transferred to [facility] for advanced heart failure team consultation. Anesthesia cleared for surgery at [facility].   Home Medications Prior to Admission medications   Medication Sig Start Date End Date Taking? Authorizing Provider  fluticasone (FLONASE) 50 MCG/ACT nasal spray Place 2 sprays into both nostrils daily. 12/13/20 01/16/23 Yes Vigg, Avanti, MD  TYVASO STARTER KIT 0.6 MG/ML SOLN  02/17/23  Yes [provider]  UPTRAVI 200 MCG TABS Take by mouth. 03/10/23  Yes [provider]  ambrisentan (LETAIRIS) 5 MG tablet Take 1 tablet (5 mg total) by mouth daily. 02/20/23   Laurey Morale, MD  ascorbic acid (VITAMIN C) 500 MG tablet Take 500 mg by mouth daily.    [provider]  Azelaic Acid 15 % gel Apply to face every morning 03/16/23   Willeen Niece, MD  cetirizine-pseudoephedrine (ZYRTEC-D) 5-120 MG tablet Take 1 tablet by mouth 2 (two) times daily. 07/08/22   Aura Dials T, NP  Cholecalciferol 25 MCG (1000 UT) tablet Take 1 tablet (1,000 Units total) by mouth daily. 11/22/21   Cannady, Corrie Dandy T, NP  clobetasol cream (TEMOVATE) 0.05 % Apply 1 application topically 2 (two) times daily. Apply to affected area 01/09/21   Federico Flake, MD  cromolyn (OPTICROM) 4 % ophthalmic solution Place 1 drop into both eyes 4 (four) times daily.  12/10/22   Cannady, Corrie Dandy T, NP  cyanocobalamin (VITAMIN B12) 1000 MCG tablet Take 1,000 mcg by mouth daily.    [provider]  diltiazem 2 % GEL Apply 1 Application topically 3 (three) times daily. Inside the rectum as directed. 08/21/22   Meredith Pel, NP  doxycycline (PERIOSTAT) 20 MG tablet Take 1 tablet (20 mg total) by mouth 2 (two) times daily. 03/17/23   Willeen Niece, MD  ELIDEL 1 % cream Apply to red scaly areas on the face BID for seb derm  03/16/23   Willeen Niece, MD  furosemide (LASIX) 40 MG tablet Take 1 tablet (40 mg total) by mouth daily. 02/26/23   Laurey Morale, MD  hydrALAZINE (APRESOLINE) 25 MG tablet Take 1 tablet (25 mg total) by mouth 2 (two) times daily. 03/13/23   Cannady, Corrie Dandy T, NP  hydrochlorothiazide (HYDRODIURIL) 12.5 MG tablet Take 12.5 mg by mouth daily.    [provider]  Ivermectin (SOOLANTRA) 1 % CREA Apply 1 Application topically at bedtime. 03/16/23   Willeen Niece, MD  levothyroxine (SYNTHROID) 112 MCG tablet Take 1 tablet (112 mcg total) by mouth daily. 10/31/22   Cannady, Corrie Dandy T, NP  omeprazole (PRILOSEC) 40 MG capsule Take 1 capsule (40 mg total) by mouth daily. 03/21/22 03/22/23  Cannady, Corrie Dandy T, NP  potassium chloride SA (KLOR-CON M) 20 MEQ tablet Take 1 tablet (20 mEq total) by mouth daily. 02/26/23   Laurey Morale, MD  tadalafil, PAH, (ADCIRCA) 20 MG tablet Take 2 tablets (40 mg total) by mouth daily. NDC's covered by plan: 16109604540, 98119147829, [id number] 02/02/23   Laurey Morale, MD  traMADol Janean Sark) 50 MG tablet Take 50 mg by mouth every 8 (eight) hours as needed. 12/17/22   [provider]  valACYclovir (VALTREX) 1000 MG tablet TAKE 1 TABLET BY MOUTH EVERY DAY (INS ONLY COVERING 34 DAYS) 01/28/23   Aura Dials T, NP    Past Medical History: Past Medical History:  Diagnosis Date   Anemia    "USE TO HAVE IT,NOT NOW,2015,2016   Arthritis    HIPS,KNEES,HANDS   Breast cancer in female (HCC)    Cancer (HCC)    LEFT   Hyperlipidemia    Hypertension    Low iron    Neuromuscular disorder (HCC)    MUSCLE PAIN,FIBROMYALGIA   Osteopenia    Pulmonary HTN (HCC)    PVC (premature ventricular contraction)    Thyroid disease     Past Surgical History: Past Surgical History:  Procedure Laterality Date   Left Ovarian removal Left    MASTECTOMY Left    "LYMPHNODES REMOVED   RIGHT/LEFT HEART CATH AND CORONARY ANGIOGRAPHY N/A 12/12/2022   Procedure: RIGHT/LEFT  HEART CATH AND CORONARY ANGIOGRAPHY;  Surgeon: Iran Ouch, MD;  Location: [facility] INVASIVE CV LAB;  Service: Cardiovascular;  Laterality: N/A;    Family History: Family History  Problem Relation Age of Onset   Hypertension Mother    Leukemia Mother    Stomach cancer Father    Colon cancer Neg Hx    Colon polyps Neg Hx    Crohn's disease Neg Hx    Esophageal cancer Neg Hx    Rectal cancer Neg Hx     Social History: Social History   Socioeconomic History   Marital status: Married    Spouse name: Not on file   Number of children: Not on file   Years of education: Not on file   Highest education level: Not on file  Occupational History   Occupation: disabled  Tobacco Use   Smoking status: Never   Smokeless tobacco: Never  Vaping Use   Vaping status: Never Used  Substance and Sexual Activity   Alcohol use: Never   Drug use: Never   Sexual activity: Not Currently  Other Topics Concern   Not on file  Social History Narrative   Not on file   Social Drivers of Health   Financial Resource Strain: High Risk (05/13/2021)   Received from [facility], [facility]   Overall Financial Resource Strain (CARDIA)    Difficulty of Paying Living Expenses: Very hard  Food Insecurity: No Food Insecurity (12/11/2022)   Hunger Vital Sign    Worried About Running Out of Food in the Last Year: Never true    Ran Out of Food in the Last Year: Never true  Transportation Needs: No Transportation Needs (12/11/2022)   PRAPARE - Administrator, Civil Service (Medical): No    Lack of Transportation (Non-Medical): No  Physical Activity: Inactive (05/13/2021)   Received from [facility], [facility]   Exercise Vital Sign    Days of Exercise per Week: 0 days    Minutes of Exercise per Session: 0 min  Stress: Stress Concern Present (05/13/2021)   Received from [facility], [facility] - Occupational Stress Questionnaire    Feeling  of Stress : Very much  Social Connections: Unknown (07/13/2021)   Received from [facility], [facility]   Social Network    Social Network: Not on file    Allergies:  Allergies  Allergen Reactions   Lisinopril Other (See Comments)    cough cough     Objective:    Vital Signs:   Temp:  [97.8 F (36.6 C)-98.4 F (36.9 C)] 98 F (36.7 C) (01/17 0852) Pulse Rate:  [73-110] 100 (01/17 0852) Resp:  [19-20] 20 (01/17 0852) BP: (108-114)/(60-79) 110/68 (01/17 0852) SpO2:  [90 %-94 %] 94 % (01/17 0852) Weight:  [83 kg] 83 kg (01/17 0838)    Weight change: Filed Weights   03/26/23 2349 03/27/23 0838  Weight: 83 kg 83 kg    Intake/Output:  No intake or output data in the 24 hours ending 03/27/23 1237    Physical Exam    General:  Tearful . No resp difficulty HEENT: normal Neck: supple. JVP difficult to assess. Carotids 2+ bilat; no bruits. No lymphadenopathy or thyromegaly appreciated. Cor: PMI nondisplaced. Regular rate & rhythm. No rubs, gallops or murmurs. Lungs: clear on 2 liters. [place]  Abdomen: obese, soft, RLQ LLQ tender, nondistended. No hepatosplenomegaly. No bruits or masses. Good bowel sounds. Extremities: no cyanosis, clubbing, rash, edema Neuro: alert & orientedx3, cranial nerves grossly intact. moves all 4 extremities w/o difficulty. Affect flat    Telemetry   Not on the monitor.   EKG    N/A  Labs   Basic Metabolic Panel: Recent Labs  Lab 03/27/23 0003  NA 135  K 3.5  CL 102  CO2 21*  GLUCOSE 151*  BUN 21*  CREATININE 0.64  CALCIUM 9.2    Liver Function Tests: Recent Labs  Lab 03/27/23 0003  AST 27  ALT 30  ALKPHOS 66  BILITOT 0.9  PROT 7.0  ALBUMIN 4.2   Recent Labs  Lab 03/27/23 0003  LIPASE 35   No results for input(s): "AMMONIA" in the last 168 hours.  CBC: Recent Labs  Lab 03/27/23 0003  WBC 13.0*  HGB  14.3  HCT 41.8  MCV 85.5  PLT 312    Cardiac Enzymes: No results for input(s): "CKTOTAL", "CKMB",  "CKMBINDEX", "TROPONINI" in the last 168 hours.  BNP: BNP (last 3 results) Recent Labs    12/22/22 1212 12/31/22 1640 02/26/23 1624  BNP 38.0 41.9 24.0    ProBNP (last 3 results) No results for input(s): "PROBNP" in the last 8760 hours.   CBG: No results for input(s): "GLUCAP" in the last 168 hours.  Coagulation Studies: No results for input(s): "LABPROT", "INR" in the last 72 hours.   Imaging   CT ABDOMEN PELVIS W CONTRAST Result Date: 03/27/2023 CLINICAL DATA:  Acute right lower quadrant abdominal pain. EXAM: CT ABDOMEN AND PELVIS WITH CONTRAST TECHNIQUE: Multidetector CT imaging of the abdomen and pelvis was performed using the standard protocol following bolus administration of intravenous contrast. RADIATION DOSE REDUCTION: This exam was performed according to the departmental dose-optimization program which includes automated exposure control, adjustment of the mA and/or kV according to patient size and/or use of iterative reconstruction technique. CONTRAST:  80mL OMNIPAQUE IOHEXOL 300 MG/ML  SOLN COMPARISON:  December 15, 2022. FINDINGS: Lower chest: No acute abnormality. Hepatobiliary: No focal liver abnormality is seen. No gallstones, gallbladder wall thickening, or biliary dilatation. Pancreas: Unremarkable. No pancreatic ductal dilatation or surrounding inflammatory changes. Spleen: Normal in size without focal abnormality. Adrenals/Urinary Tract: Adrenal glands are unremarkable. Kidneys are normal, without renal calculi, focal lesion, or hydronephrosis. Bladder is unremarkable. Stomach/Bowel: The stomach is unremarkable. There is no evidence of bowel obstruction. The appendix is enlarged and inflamed measuring 15 mm consistent with acute appendicitis. No definite abscess formation is noted. Vascular/Lymphatic: No significant vascular findings are present. No enlarged abdominal or pelvic lymph nodes. Reproductive: 2 cm posterior uterine fibroid is noted. No adnexal abnormality is  noted. Other: No abdominal wall hernia or abnormality. No abdominopelvic ascites. Musculoskeletal: No acute or significant osseous findings. IMPRESSION: Findings consistent with acute appendicitis. No definite abscess formation is noted. Electronically Signed   By: Lupita Raider M.D.   On: 03/27/2023 10:53   DG Chest 2 View Result Date: 03/27/2023 CLINICAL DATA:  Abdominal pain with nausea, vomiting and shortness of breath. EXAM: CHEST - 2 VIEW COMPARISON:  December 23, 2022 FINDINGS: The heart size and mediastinal contours are within normal limits. Both lungs are clear. The visualized skeletal structures are unremarkable. IMPRESSION: No active cardiopulmonary disease. Electronically Signed   By: Aram Candela M.D.   On: 03/27/2023 00:26     Medications:     Current Medications:   Infusions:     Patient Profile    Mr Tonya Odonnell is a 59 year old with a history of pulmonary hypertension, RV failure, breast cancer, fibromyalgia,  hypothyroidism, and RV failure.   Admitted with acute appendicitis. Surgical clearance requested by Advanced Heart Failure Team.   Assessment/Plan  1. Acute Appendicitis   WBC 13K.  CT abd/pelvis  with sign of acure appendicitis. General Surgery consulted. Transfer to [facility] for surgical clearance. High surgical risk but not prohibitive.  Recommend admit to [facility] after surgery with swan.   2. Pulmonary Hypertension/RV Failure  [facility] 12/2022- PA 76/38 (52)  PCWP 10 PVR 7.4. Started on combination therapy in October.  On triple therapy with adcirca, ambrisentan, and selexipag. Last dose of selexipag 03/26/23.  Repeat stat Echo.  Volume status looks ok.   Dr Shirlee Latch to follow.   Amy Clegg NP-C  3:37 PM  Patient seen with NP, agree with the above note.  Patient has group 1 PAH, currently treated with ambrisentan, tadalafil, and just started selexipag.  She had been doing well symptomatically prior to this episode.  She was able to shovel snow last weekend.  No  dyspnea walking on flat ground.  Yesterday, she developed RLQ abdominal pain that has remained constant.  She came to the ER, CT showed acute appendicitis.   General: NAD Neck: Thick, JVP 8 cm, no thyromegaly or thyroid nodule.  Lungs: Clear to auscultation bilaterally with normal respiratory effort. CV: Nondisplaced PMI.  Heart regular S1/S2, no S3/S4, no murmur.  Trace ankle edema.   Abdomen: Soft, nontender, no hepatosplenomegaly, no distention.  Skin: Intact without lesions or rashes.  Neurologic: Alert and oriented x 3.  Psych: Normal affect. Extremities: No clubbing or cyanosis.  HEENT: Normal.   Patient has group 1 PAH, currently on triple pulmonary vasodilator therapy and had been doing well at home.  NYHA class II symptoms at worst, able to shovel snow last weekend.  No syncope/lightheadedness.  She has acute appendicitis without complication. HS-TnI is normal.  - I will arrange for echo and check BNP.   - Patient could proceed with surgery at moderate-high risk given h/o pulmonary arterial hypertension.  She has been stable recently with at most NYHA class II symptoms.  She is not markedly volume overloaded on exam.  If she proceeds with surgery, would recommend a cardiac anesthesiologist with Swan and arterial line placement in the OR.  - Resume her PH medications when she is taking po again.  - Resume Lasix when taking po again.   Marca Ancona 03/27/2023 4:41 PM

## 2023-03-27 NOTE — Anesthesia Procedure Notes (Signed)
Procedure Name: Intubation Date/Time: 03/27/2023 10:19 PM  Performed by: Colinda Barth T, CRNAPre-anesthesia Checklist: Patient identified, Emergency Drugs available, Suction available and Patient being monitored Patient Re-evaluated:Patient Re-evaluated prior to induction Oxygen Delivery Method: Circle system utilized Preoxygenation: Pre-oxygenation with 100% oxygen Induction Type: IV induction, Rapid sequence and Cricoid Pressure applied Ventilation: Mask ventilation without difficulty Laryngoscope Size: Glidescope and 3 Grade View: Grade I Tube type: Oral Tube size: 7.5 mm Number of attempts: 1 Airway Equipment and Method: Stylet and Oral airway Placement Confirmation: ETT inserted through vocal cords under direct vision, positive ETCO2 and breath sounds checked- equal and bilateral Secured at: 22 cm Tube secured with: Tape Dental Injury: Teeth and Oropharynx as per pre-operative assessment

## 2023-03-27 NOTE — Op Note (Signed)
Patient: Tonya Odonnell (1966-02-19, [id number])  Date of Surgery: 03/27/2023  Preoperative Diagnosis: appendicitis   Postoperative Diagnosis: appendicitis   Surgical Procedure: APPENDECTOMY LAPAROSCOPIC:    Operative Team Members:  Surgeons and Role:    * Dallana Mavity, Hyman Hopes, MD - Primary   Anesthesiologist: Atilano Median, DO CRNA: Reine Just, CRNA   Anesthesia: General   Fluids:  Total I/O In: 800 [I.V.:800] Out: -   Complications: None  Drains:  none   Specimen:  ID Type Source Tests Collected by Time Destination  1 : Appendix Tissue PATH Appendix SURGICAL PATHOLOGY Azim Gillingham, Hyman Hopes, MD 03/27/2023 2232      Disposition:  PACU - hemodynamically stable.  Plan of Care:  Continue inpatient care    Indications for Procedure: [name] is a 58 y.o. female who presented with abdominal pain.  History, physical and imaging was concerning for appendicitis, so laparoscopic appendectomy was recommended for the patient.  The procedure itself, as well as the risks, benefits and alternatives were discussed with the patient.  Risks discussed included but were not limited to the risk of bleeding, infection, damage to nearby structures, need to convert to open procedure, incisional hernia, and the need for additional procedures or surgeries.  Cardiology saw the patient preoperatively, she understands due to her history of pulmonary hypertension, she is moderate to high risk for surgery. With this discussion complete and all questions answered the patient granted consent to proceed.  Findings: Inflamed appendix  Infection status: Patient: [name] Patient Case: Emergent Infection Present At Time Of Surgery (PATOS):  Appendix   Description of Procedure:   On the date stated above, the patient was taken to the operating room suite and placed in supine positioning with the left arm tucked.  Sequential compression devices were  placed on the lower extremities to prevent blood clots.  General endotracheal anesthesia was induced.  The patient urinated just prior to surgery so a foley catheter was not placed.  Preoperative antibiotics were given.  The patient's abdomen was prepped and draped in the usual sterile fashion.  A time-out was completed verifying the correct patient, procedure, positioning and equipment needed for the case.  We began by anesthetizing the skin with local anesthetic and then making a 5 mm incision just below the umbilicus.  We dissected through the subcutaneous tissues to the fascia.  The fascia was grasped and elevated using a Kocher clamp.  A Veress needle was inserted into the abdomen and the abdomen was insufflated to 15 mmHg.  A 5 mm trocar was inserted in this position under optical guidance and then the abdomen was inspected.  There was no trauma to the underlying viscera with initial trocar placement.  Any abnormal findings, other than inflammation in the right lower quadrant, are listed above in the findings section.  Two additional trocars were placed, one 5 mm trocar suprapubically and one 12 mm trocar in the left lower quadrant.  These were placed under direct vision without any trauma to the underlying viscera.    The patient was then placed in head down, left side down positioning.  The appendix was identified and dissected free from its attachments to the abdominal wall, small intestine and cecum.  A window was created in the mesoappendix using blunt dissection.  We used one 60 mm white load of the endoscopic linear stapler to divide the base of the appendix from the cecum.  Then the harmonic scalpel was used to divide  the mesoappendix.  The appendix was removed through the 12 mm port site in the left lower quadrant.  A suction irrigator was used to clean the operative field. The staple line was well formed.  There was good hemostasis at the end of the case.  At this point we directed our attention  to closure.  The patient was moved back to a level position.  The 12 mm trocar site was closed at the fascial level using an 0-vicryl on a fascial suture passer.  The abdomen was desufflated.  The skin was closed using 4-0 Monocryl and dermabond.  All sponge and needle counts were correct at the end of the case.    [name], MD General, Bariatric, & Minimally Invasive Surgery [facility], [place]

## 2023-03-27 NOTE — ED Notes (Signed)
EMTALA reviewed by this RN, pt ready for transport. Consent obtained.

## 2023-03-27 NOTE — ED Provider Notes (Signed)
[facility] Provider Note    Event Date/Time   First MD Initiated Contact with Patient 03/27/23 [phone]     (approximate)   History   Abdominal Pain   HPI  Tonya Odonnell is a 58 y.o. female who presents to the ER for evaluation of abdominal pain suprapubic and right lower quadrant in nature.  Symptoms started primarily last night associate with nausea vomiting no diarrhea.  States the pain does go through to her back.  Did start recent new medication for pulmonary hypertension is concerned it could be related.  Has had some chills but no measured fevers.     Physical Exam   Triage Vital Signs: ED Triage Vitals  Encounter Vitals Group     BP 03/26/23 2355 108/79     Systolic BP Percentile --      Diastolic BP Percentile --      Pulse Rate 03/26/23 2355 73     Resp 03/26/23 2355 19     Temp 03/26/23 2355 97.8 F (36.6 C)     Temp Source 03/26/23 2355 Oral     SpO2 03/26/23 2355 92 %     Weight 03/26/23 2349 183 lb (83 kg)     Height 03/27/23 0838 5\' 3"  (1.6 m)     Head Circumference --      Peak Flow --      Pain Score 03/26/23 2349 10     Pain Loc --      Pain Education --      Exclude from Growth Chart --     Most recent vital signs: Vitals:   03/27/23 0245 03/27/23 0852  BP: 114/60 110/68  Pulse: (!) 110 100  Resp: 20 20  Temp: 98.4 F (36.9 C) 98 F (36.7 C)  SpO2: 90% 94%     Constitutional: Alert  Eyes: Conjunctivae are normal.  Head: Atraumatic. Nose: No congestion/rhinnorhea. Mouth/Throat: Mucous membranes are moist.   Neck: Painless ROM.  Cardiovascular:   Good peripheral circulation. Respiratory: Normal respiratory effort.  No retractions.  Gastrointestinal: Soft and nontender.  Musculoskeletal:  no deformity Neurologic:  MAE spontaneously. No gross focal neurologic deficits are appreciated.  Skin:  Skin is warm, dry and intact. No rash noted. Psychiatric: Mood and affect are normal. Speech and behavior are  normal.    ED Results / Procedures / Treatments   Labs (all labs ordered are listed, but only abnormal results are displayed) Labs Reviewed  COMPREHENSIVE METABOLIC PANEL - Abnormal; Notable for the following components:      Result Value   CO2 21 (*)    Glucose, Bld 151 (*)    BUN 21 (*)    All other components within normal limits  CBC - Abnormal; Notable for the following components:   WBC 13.0 (*)    All other components within normal limits  URINALYSIS, ROUTINE W REFLEX MICROSCOPIC - Abnormal; Notable for the following components:   Color, Urine YELLOW (*)    APPearance CLEAR (*)    Ketones, ur 20 (*)    Protein, ur 30 (*)    Leukocytes,Ua MODERATE (*)    All other components within normal limits  LIPASE, BLOOD  TROPONIN I (HIGH SENSITIVITY)  TROPONIN I (HIGH SENSITIVITY)     EKG  ED ECG REPORT I, Willy Eddy, the attending physician, personally viewed and interpreted this ECG.   Date: 03/27/2023  EKG Time: 23:55  Rate: 75  Rhythm: sinus  Axis: normal  Intervals: borderline prolonged  qt  ST&T Change: no stemi    RADIOLOGY Please see ED Course for my review and interpretation.  I personally reviewed all radiographic images ordered to evaluate for the above acute complaints and reviewed radiology reports and findings.  These findings were personally discussed with the patient.  Please see medical record for radiology report.    PROCEDURES:  Critical Care performed: No  Procedures   MEDICATIONS ORDERED IN ED: Medications  morphine (PF) 4 MG/ML injection 4 mg (has no administration in time range)  piperacillin-tazobactam (ZOSYN) IVPB 3.375 g (has no administration in time range)  sodium chloride 0.9 % bolus 500 mL (500 mLs Intravenous New Bag/Given 03/27/23 0927)  ondansetron (ZOFRAN) injection 4 mg (4 mg Intravenous Given 03/27/23 0927)  iohexol (OMNIPAQUE) 300 MG/ML solution 80 mL (80 mLs Intravenous Contrast Given 03/27/23 0943)      IMPRESSION / MDM / ASSESSMENT AND PLAN / ED COURSE  I reviewed the triage vital signs and the nursing notes.                              Differential diagnosis includes, but is not limited to, enteritis, colitis, diverticulitis, appendicitis, fbi, noro  Patient presenting to the ER for evaluation of symptoms as described above.  Based on symptoms, risk factors and considered above differential, this presenting complaint could reflect a potentially life-threatening illness therefore the patient will be placed on continuous pulse oximetry and telemetry for monitoring.  Laboratory evaluation will be sent to evaluate for the above complaints.  CT imaging on my review and interpretation concerning for acute mental status.  Per radiology report no sign of abscess or perforation.  I discussed the case in consultation with Dr. Maia Plan of general surgery who will evaluate patient bedside.  I have ordered IV fluids, IV pain medication as well as IV antibiotics.        FINAL CLINICAL IMPRESSION(S) / ED DIAGNOSES   Final diagnoses:  Acute appendicitis, unspecified acute appendicitis type     Rx / DC Orders   ED Discharge Orders     None        Note:  This document was prepared using Dragon voice recognition software and may include unintentional dictation errors.    Willy Eddy, MD 03/27/23 1104

## 2023-03-27 NOTE — Anesthesia Preprocedure Evaluation (Addendum)
Anesthesia Evaluation  Patient identified by MRN, date of birth, ID band Patient awake    Reviewed: Allergy & Precautions, NPO status , Patient's Chart, lab work & pertinent test results  Airway Mallampati: II  TM Distance: >3 FB Neck ROM: Full    Dental no notable dental hx.    Pulmonary neg pulmonary ROS   Pulmonary exam normal        Cardiovascular hypertension, Pt. on medications pulmonary hypertension (On Tadalafil 40 mg daily, Ambrisentan 5 mg daily, selexipag) Rhythm:Regular Rate:Normal  Cath 10/24: 1.  Normal coronary arteries. 2.  Left ventricular angiography was not performed.  EF was low normal by echo.  Normal left ventricular end-diastolic pressure. 3.  Right heart catheterization showed mildly elevated RA pressure, severe pulmonary hypertension, normal wedge pressure and normal cardiac output.   RA: 11 mmHg RV: 78/6 mmHg PW: 10 mmHg PA: 76/38 with a mean of 52 mmHg.  Pulmonary vascular resistance is 7.3 Woods units Cardiac output: 5.7 with an index of 3.04.     Neuro/Psych   Anxiety     negative neurological ROS     GI/Hepatic Neg liver ROS,GERD  ,,Acute appendicitis    Endo/Other  negative endocrine ROS    Renal/GU negative Renal ROS  negative genitourinary   Musculoskeletal  (+) Arthritis ,  Fibromyalgia -  Abdominal Normal abdominal exam  (+)   Peds  Hematology  (+) Blood dyscrasia, anemia   Anesthesia Other Findings   Reproductive/Obstetrics                             Anesthesia Physical Anesthesia Plan  ASA: 4  Anesthesia Plan: General   Post-op Pain Management:    Induction: Intravenous  PONV Risk Score and Plan: 3 and Ondansetron, Dexamethasone, Midazolam and Treatment may vary due to age or medical condition  Airway Management Planned: Mask and Oral ETT  Additional Equipment: Arterial line  Intra-op Plan:   Post-operative Plan: Possible Post-op  intubation/ventilation  Informed Consent: I have reviewed the patients History and Physical, chart, labs and discussed the procedure including the risks, benefits and alternatives for the proposed anesthesia with the patient or authorized representative who has indicated his/her understanding and acceptance.     Dental advisory given  Plan Discussed with: CRNA  Anesthesia Plan Comments: (FLOW TRAC)       Anesthesia Quick Evaluation

## 2023-03-27 NOTE — Consult Note (Signed)
SURGICAL CONSULTATION NOTE   HISTORY OF PRESENT ILLNESS (HPI):  58 y.o. female presented to [facility] ED for evaluation of abdominal pain, nausea and vomiting. Patient reports she started having abdominal pain yesterday.  Pain localized in the lower abdomen, more specifically in the suprapubic area.  Pain does not radiate to other part of body.  Pain is aggravated by movement of the abdominal wall.  Pain is alleviated by rest and staying still.  She endorses associated nausea and vomiting.  At the ED she was found with no fever, stable vital signs.  On physical exam there is tenderness in the suprapubic area.  She was found leukocytosis of 13,000.  She had a CT scan of the abdomen and pelvis that shows sign of acute appendicitis with stranding around the appendix, no perforation or abscess.  I personally evaluated the images.  Surgery is consulted by Dr. Roxan Hockey in this context for evaluation and management of acute appendicitis.  PAST MEDICAL HISTORY (PMH):  Past Medical History:  Diagnosis Date   Anemia    "USE TO HAVE IT,NOT NOW,2015,2016   Arthritis    HIPS,KNEES,HANDS   Breast cancer in female (HCC)    Cancer (HCC)    LEFT   Hyperlipidemia    Hypertension    Low iron    Neuromuscular disorder (HCC)    MUSCLE PAIN,FIBROMYALGIA   Osteopenia    Pulmonary HTN (HCC)    PVC (premature ventricular contraction)    Thyroid disease      PAST SURGICAL HISTORY (PSH):  Past Surgical History:  Procedure Laterality Date   Left Ovarian removal Left    MASTECTOMY Left    "LYMPHNODES REMOVED   RIGHT/LEFT HEART CATH AND CORONARY ANGIOGRAPHY N/A 12/12/2022   Procedure: RIGHT/LEFT HEART CATH AND CORONARY ANGIOGRAPHY;  Surgeon: Iran Ouch, MD;  Location: [facility] INVASIVE CV LAB;  Service: Cardiovascular;  Laterality: N/A;     MEDICATIONS:  Prior to Admission medications   Medication Sig Start Date End Date Taking? Authorizing Provider  ambrisentan (LETAIRIS) 5 MG tablet Take 1 tablet (5 mg  total) by mouth daily. 02/20/23   Laurey Morale, MD  ascorbic acid (VITAMIN C) 500 MG tablet Take 500 mg by mouth daily.    [provider]  Azelaic Acid 15 % gel Apply to face every morning 03/16/23   Willeen Niece, MD  cetirizine-pseudoephedrine (ZYRTEC-D) 5-120 MG tablet Take 1 tablet by mouth 2 (two) times daily. 07/08/22   Aura Dials T, NP  Cholecalciferol 25 MCG (1000 UT) tablet Take 1 tablet (1,000 Units total) by mouth daily. 11/22/21   Cannady, Corrie Dandy T, NP  clobetasol cream (TEMOVATE) 0.05 % Apply 1 application topically 2 (two) times daily. Apply to affected area 01/09/21   Federico Flake, MD  cromolyn (OPTICROM) 4 % ophthalmic solution Place 1 drop into both eyes 4 (four) times daily. 12/10/22   Cannady, Corrie Dandy T, NP  cyanocobalamin (VITAMIN B12) 1000 MCG tablet Take 1,000 mcg by mouth daily.    [provider]  diltiazem 2 % GEL Apply 1 Application topically 3 (three) times daily. Inside the rectum as directed. 08/21/22   Meredith Pel, NP  doxycycline (PERIOSTAT) 20 MG tablet Take 1 tablet (20 mg total) by mouth 2 (two) times daily. 03/17/23   Willeen Niece, MD  ELIDEL 1 % cream Apply to red scaly areas on the face BID for seb derm 03/16/23   Willeen Niece, MD  fluticasone [facility]) 50 MCG/ACT nasal spray Place 2 sprays into  both nostrils daily. Patient taking differently: Place 2 sprays into both nostrils as needed for allergies. 12/13/20 01/16/23  Vigg, Avanti, MD  furosemide (LASIX) 40 MG tablet Take 1 tablet (40 mg total) by mouth daily. 02/26/23   Laurey Morale, MD  hydrALAZINE (APRESOLINE) 25 MG tablet Take 1 tablet (25 mg total) by mouth 2 (two) times daily. 03/13/23   Cannady, Corrie Dandy T, NP  hydrochlorothiazide (HYDRODIURIL) 12.5 MG tablet Take 12.5 mg by mouth daily.    [provider]  Ivermectin (SOOLANTRA) 1 % CREA Apply 1 Application topically at bedtime. 03/16/23   Willeen Niece, MD  levothyroxine (SYNTHROID) 112 MCG tablet Take 1 tablet  (112 mcg total) by mouth daily. 10/31/22   Cannady, Corrie Dandy T, NP  omeprazole (PRILOSEC) 40 MG capsule Take 1 capsule (40 mg total) by mouth daily. 03/21/22 03/22/23  Cannady, Corrie Dandy T, NP  potassium chloride SA (KLOR-CON M) 20 MEQ tablet Take 1 tablet (20 mEq total) by mouth daily. 02/26/23   Laurey Morale, MD  tadalafil, PAH, (ADCIRCA) 20 MG tablet Take 2 tablets (40 mg total) by mouth daily. NDC's covered by plan: 78295621308, [id number], [id number] 02/02/23   Laurey Morale, MD  traMADol Janean Sark) 50 MG tablet Take 50 mg by mouth every 8 (eight) hours as needed. 12/17/22   [provider]  valACYclovir (VALTREX) 1000 MG tablet TAKE 1 TABLET BY MOUTH EVERY DAY (INS ONLY COVERING 34 DAYS) 01/28/23   Cannady, Corrie Dandy T, NP     ALLERGIES:  Allergies  Allergen Reactions   Lisinopril Other (See Comments)    cough cough      SOCIAL HISTORY:  Social History   Socioeconomic History   Marital status: Married    Spouse name: Not on file   Number of children: Not on file   Years of education: Not on file   Highest education level: Not on file  Occupational History   Occupation: disabled  Tobacco Use   Smoking status: Never   Smokeless tobacco: Never  Vaping Use   Vaping status: Never Used  Substance and Sexual Activity   Alcohol use: Never   Drug use: Never   Sexual activity: Not Currently  Other Topics Concern   Not on file  Social History Narrative   Not on file   Social Drivers of Health   Financial Resource Strain: High Risk (05/13/2021)   Received from [facility], [facility]   Overall Financial Resource Strain (CARDIA)    Difficulty of Paying Living Expenses: Very hard  Food Insecurity: No Food Insecurity (12/11/2022)   Hunger Vital Sign    Worried About Running Out of Food in the Last Year: Never true    Ran Out of Food in the Last Year: Never true  Transportation Needs: No Transportation Needs (12/11/2022)   PRAPARE - Scientist, research (physical sciences) (Medical): No    Lack of Transportation (Non-Medical): No  Physical Activity: Inactive (05/13/2021)   Received from [facility], [facility]   Exercise Vital Sign    Days of Exercise per Week: 0 days    Minutes of Exercise per Session: 0 min  Stress: Stress Concern Present (05/13/2021)   Received from [facility], [facility] of Occupational Health - Occupational Stress Questionnaire    Feeling of Stress : Very much  Social Connections: Unknown (07/13/2021)   Received from [facility], [facility]   Social Network    Social Network: Not on file  Intimate  Partner Violence: Not At Risk (12/11/2022)   Humiliation, Afraid, Rape, and Kick questionnaire    Fear of Current or Ex-Partner: No    Emotionally Abused: No    Physically Abused: No    Sexually Abused: No      FAMILY HISTORY:  Family History  Problem Relation Age of Onset   Hypertension Mother    Leukemia Mother    Stomach cancer Father    Colon cancer Neg Hx    Colon polyps Neg Hx    Crohn's disease Neg Hx    Esophageal cancer Neg Hx    Rectal cancer Neg Hx      REVIEW OF SYSTEMS:  Constitutional: denies weight loss, fever, chills, or sweats  Eyes: denies any other vision changes, history of eye injury  ENT: denies sore throat, hearing problems  Respiratory: denies shortness of breath, wheezing  Cardiovascular: denies chest pain, palpitations  Gastrointestinal: Positive abdominal pain, nausea and vomiting Genitourinary: denies burning with urination or urinary frequency Musculoskeletal: denies any other joint pains or cramps  Skin: denies any other rashes or skin discolorations  Neurological: denies any other headache, dizziness, weakness  Psychiatric: denies any other depression, anxiety   All other review of systems were negative   VITAL SIGNS:  Temp:  [97.8 F (36.6 C)-98.4 F (36.9 C)] 98 F (36.7 C) (01/17 0852) Pulse Rate:  [73-110] 100 (01/17 0852) Resp:   [19-20] 20 (01/17 0852) BP: (108-114)/(60-79) 110/68 (01/17 0852) SpO2:  [90 %-94 %] 94 % (01/17 0852) Weight:  [83 kg] 83 kg (01/17 0838)     Height: 5\' 3"  (160 cm) Weight: 83 kg BMI (Calculated): 32.42   INTAKE/OUTPUT:  This shift: No intake/output data recorded.  Last 2 shifts: @IOLAST2SHIFTS @   PHYSICAL EXAM:  Constitutional:  -- Normal body habitus  -- Awake, alert, and oriented x3  Eyes:  -- Pupils equally round and reactive to light  -- No scleral icterus  Ear, nose, and throat:  -- No jugular venous distension  Pulmonary:  -- No crackles  -- Equal breath sounds bilaterally -- Breathing non-labored at rest Cardiovascular:  -- S1, S2 present  -- No pericardial rubs Gastrointestinal:  -- Abdomen soft, tender to palpation in the suprapubic area, non-distended, no guarding or rebound tenderness -- No abdominal masses appreciated, pulsatile or otherwise  Musculoskeletal and Integumentary:  -- Wounds: None appreciated -- Extremities: B/L UE and LE FROM, hands and feet warm, no edema  Neurologic:  -- Motor function: intact and symmetric -- Sensation: intact and symmetric   Labs:     Latest Ref Rng & Units 03/27/2023   12:03 AM 12/31/2022    4:40 PM 12/22/2022   12:12 PM  CBC  WBC 4.0 - 10.5 K/uL 13.0  9.4  6.8   Hemoglobin 12.0 - 15.0 g/dL 91.4  78.2  95.6   Hematocrit 36.0 - 46.0 % 41.8  49.9  53.6   Platelets 150 - 400 K/uL 312  335  379       Latest Ref Rng & Units 03/27/2023   12:03 AM 03/12/2023    2:30 PM 02/26/2023    4:24 PM  CMP  Glucose 70 - 99 mg/dL 213  086  99   BUN 6 - 20 mg/dL 21  16  20    Creatinine 0.44 - 1.00 mg/dL 5.78  4.69  6.29   Sodium 135 - 145 mmol/L 135  138  140   Potassium 3.5 - 5.1 mmol/L 3.5  3.4  3.5  Chloride 98 - 111 mmol/L 102  102  106   CO2 22 - 32 mmol/L 21  23  21    Calcium 8.9 - 10.3 mg/dL 9.2  9.3  9.2   Total Protein 6.5 - 8.1 g/dL 7.0     Total Bilirubin 0.0 - 1.2 mg/dL 0.9     Alkaline Phos 38 - 126 U/L 66      AST 15 - 41 U/L 27     ALT 0 - 44 U/L 30       Imaging studies:  EXAM: CT ABDOMEN AND PELVIS WITH CONTRAST   TECHNIQUE: Multidetector CT imaging of the abdomen and pelvis was performed using the standard protocol following bolus administration of intravenous contrast.   RADIATION DOSE REDUCTION: This exam was performed according to the departmental dose-optimization program which includes automated exposure control, adjustment of the mA and/or kV according to patient size and/or use of iterative reconstruction technique.   CONTRAST:  80mL OMNIPAQUE IOHEXOL 300 MG/ML  SOLN   COMPARISON:  December 15, 2022.   FINDINGS: Lower chest: No acute abnormality.   Hepatobiliary: No focal liver abnormality is seen. No gallstones, gallbladder wall thickening, or biliary dilatation.   Pancreas: Unremarkable. No pancreatic ductal dilatation or surrounding inflammatory changes.   Spleen: Normal in size without focal abnormality.   Adrenals/Urinary Tract: Adrenal glands are unremarkable. Kidneys are normal, without renal calculi, focal lesion, or hydronephrosis. Bladder is unremarkable.   Stomach/Bowel: The stomach is unremarkable. There is no evidence of bowel obstruction. The appendix is enlarged and inflamed measuring 15 mm consistent with acute appendicitis. No definite abscess formation is noted.   Vascular/Lymphatic: No significant vascular findings are present. No enlarged abdominal or pelvic lymph nodes.   Reproductive: 2 cm posterior uterine fibroid is noted. No adnexal abnormality is noted.   Other: No abdominal wall hernia or abnormality. No abdominopelvic ascites.   Musculoskeletal: No acute or significant osseous findings.   IMPRESSION: Findings consistent with acute appendicitis. No definite abscess formation is noted.     Electronically Signed   By: Lupita Raider M.D.   On: 03/27/2023 10:53  Assessment/Plan:  58 y.o. female with acute appendicitis,  complicated by pertinent comorbidities including severe pulmonary hypertension.  Upon evaluation of the patient and discussion with the OR team to proceed with appendectomy, I discussed case with 3 of my anesthesiologist.  Due to patient recent diagnosis of Severe Pulmonary Hypertension with right ventricular failure from recent admission in October 2024 and recent evaluation by cardiology in December 2024, pulmonary artery mean pressure of 52 currently on 3 pulmonary hypertension medications including tadalafil 40 mg, Ambrisentan 5 mg daily and selexipag 200 mcg twice daily.  Upon discussion with anesthesia, with her last evaluation by cardiology and current patient's status patient is high risk for anesthesia with possible need of nitrate and right heart catheterization with Swan-Ganz monitoring which we do not have available in this institution.  Due to being high risk for anesthesia in this institution, I think that even treating with antibiotic, if patient does not respond and need of urgent surgical intervention is too high risk to keep in this institution.  Patient will benefit of transfer to tertiary center with advanced cardiac care, specifically from the anesthesia/surgical standpoint.  I think that patient currently being stable is the best time to transfer to tertiary center, avoiding any urgent need of surgical intervention and then not being able to be transferred due to capacity.  This recommendation were discussed with Dr.  Roxan Hockey, ED physician.   Gae Gallop, MD

## 2023-03-27 NOTE — Consult Note (Incomplete)
Advanced Heart Failure Team Consult Note   Primary Physician: Marjie Skiff, NP Cardiologist/PAH: Dr Shirlee Latch   Reason for Consultation: PreOp Clearance/ Pulmonary Hypertension   HPI:    Tonya Odonnell is seen today for evaluation of pulmonary hypertension at the request of Dr Roxan Hockey.   Tonya Odonnell is a 58 year old with a history of pulmonary hypertension, RV failure, breast cancer, fibromyalgia,  hypothyroidism, and RV failure.   Admitted 10/24 at [facility] with progressive  dyspnea as well as pain.  Prior to that she was seen by [facility] rheumatology and diagnosed with fibromyalgia.  2 months prior to her hospitalization she reports having progressive exertional dyspnea with shortness of breath just walking to her mailbox.  2 weeks prior to admission this progressed to chest tightness both at rest and with exertion.  During her hospitalization, echocardiogram was done showing EF of 50 to 55% with D-shaped interventricular septum.  She underwent right and left heart catheterization that was significant for pulmonary hypertension with a PVR of 7.3 Wood units.  CTA chest during this admission showed no PE, no parenchymal lung disease.  PFTs in 10/24 were surprisingly normal.  V/Q scan in 10/24 showed no evidence for chronic PE.  She was subsequently started on tadalafil 20 mg and PH evaluation was started.   Sleep study showed mild OSA, pulmonary did not recommend CPAP.   Cath 12/2022 normal  coronaries,  PA 76/38 (52),  PVR 7.3,  CO 5.7, CI  3.  VQ no PE.  Started on tadalafil 20 mg daily. Sleep study mild OSA.  PAH Therapy: Tadalafil 40 mg daily and Ambrisentan 5 mg  daily. She was prescribed selexipag in December and had her dose 03/22/22. She stopped  Of note she declined Tyvaso and preferred pills.    Presented to [facility]  with abdominal pain. WBC 13K.  CT abd/pelvis  with sign of acure appendicitis. General Surgery consulted. High risk for anesthesia at [facility]. Transferred to [facility] for advanced heart  failure team consultation. Anesthesia cleared for surgery at [facility].   Home Medications Prior to Admission medications   Medication Sig Start Date End Date Taking? Authorizing Provider  fluticasone (FLONASE) 50 MCG/ACT nasal spray Place 2 sprays into both nostrils daily. 12/13/20 01/16/23 Yes Vigg, Avanti, MD  TYVASO STARTER KIT 0.6 MG/ML SOLN  02/17/23  Yes [provider]  UPTRAVI 200 MCG TABS Take by mouth. 03/10/23  Yes [provider]  ambrisentan (LETAIRIS) 5 MG tablet Take 1 tablet (5 mg total) by mouth daily. 02/20/23   Laurey Morale, MD  ascorbic acid (VITAMIN C) 500 MG tablet Take 500 mg by mouth daily.    [provider]  Azelaic Acid 15 % gel Apply to face every morning 03/16/23   Willeen Niece, MD  cetirizine-pseudoephedrine (ZYRTEC-D) 5-120 MG tablet Take 1 tablet by mouth 2 (two) times daily. 07/08/22   Aura Dials T, NP  Cholecalciferol 25 MCG (1000 UT) tablet Take 1 tablet (1,000 Units total) by mouth daily. 11/22/21   Cannady, Corrie Dandy T, NP  clobetasol cream (TEMOVATE) 0.05 % Apply 1 application topically 2 (two) times daily. Apply to affected area 01/09/21   Federico Flake, MD  cromolyn (OPTICROM) 4 % ophthalmic solution Place 1 drop into both eyes 4 (four) times daily. 12/10/22   Cannady, Corrie Dandy T, NP  cyanocobalamin (VITAMIN B12) 1000 MCG tablet Take 1,000 mcg by mouth daily.    [provider]  diltiazem 2 % GEL Apply 1  Application topically 3 (three) times daily. Inside the rectum as directed. 08/21/22   Meredith Pel, NP  doxycycline (PERIOSTAT) 20 MG tablet Take 1 tablet (20 mg total) by mouth 2 (two) times daily. 03/17/23   Willeen Niece, MD  ELIDEL 1 % cream Apply to red scaly areas on the face BID for seb derm 03/16/23   Willeen Niece, MD  furosemide (LASIX) 40 MG tablet Take 1 tablet (40 mg total) by mouth daily. 02/26/23   Laurey Morale, MD  hydrALAZINE (APRESOLINE) 25 MG tablet Take 1 tablet (25 mg total) by mouth 2  (two) times daily. 03/13/23   Cannady, Corrie Dandy T, NP  hydrochlorothiazide (HYDRODIURIL) 12.5 MG tablet Take 12.5 mg by mouth daily.    [provider]  Ivermectin (SOOLANTRA) 1 % CREA Apply 1 Application topically at bedtime. 03/16/23   Willeen Niece, MD  levothyroxine (SYNTHROID) 112 MCG tablet Take 1 tablet (112 mcg total) by mouth daily. 10/31/22   Cannady, Corrie Dandy T, NP  omeprazole (PRILOSEC) 40 MG capsule Take 1 capsule (40 mg total) by mouth daily. 03/21/22 03/22/23  Cannady, Corrie Dandy T, NP  potassium chloride SA (KLOR-CON M) 20 MEQ tablet Take 1 tablet (20 mEq total) by mouth daily. 02/26/23   Laurey Morale, MD  tadalafil, PAH, (ADCIRCA) 20 MG tablet Take 2 tablets (40 mg total) by mouth daily. NDC's covered by plan: 60454098119, [id number], [id number] 02/02/23   Laurey Morale, MD  traMADol Janean Sark) 50 MG tablet Take 50 mg by mouth every 8 (eight) hours as needed. 12/17/22   [provider]  valACYclovir (VALTREX) 1000 MG tablet TAKE 1 TABLET BY MOUTH EVERY DAY (INS ONLY COVERING 34 DAYS) 01/28/23   Aura Dials T, NP    Past Medical History: Past Medical History:  Diagnosis Date   Anemia    "USE TO HAVE IT,NOT NOW,2015,2016   Arthritis    HIPS,KNEES,HANDS   Breast cancer in female (HCC)    Cancer (HCC)    LEFT   Hyperlipidemia    Hypertension    Low iron    Neuromuscular disorder (HCC)    MUSCLE PAIN,FIBROMYALGIA   Osteopenia    Pulmonary HTN (HCC)    PVC (premature ventricular contraction)    Thyroid disease     Past Surgical History: Past Surgical History:  Procedure Laterality Date   Left Ovarian removal Left    MASTECTOMY Left    "LYMPHNODES REMOVED   RIGHT/LEFT HEART CATH AND CORONARY ANGIOGRAPHY N/A 12/12/2022   Procedure: RIGHT/LEFT HEART CATH AND CORONARY ANGIOGRAPHY;  Surgeon: Iran Ouch, MD;  Location: [facility] INVASIVE CV LAB;  Service: Cardiovascular;  Laterality: N/A;    Family History: Family History  Problem Relation Age of Onset    Hypertension Mother    Leukemia Mother    Stomach cancer Father    Colon cancer Neg Hx    Colon polyps Neg Hx    Crohn's disease Neg Hx    Esophageal cancer Neg Hx    Rectal cancer Neg Hx     Social History: Social History   Socioeconomic History   Marital status: Married    Spouse name: Not on file   Number of children: Not on file   Years of education: Not on file   Highest education level: Not on file  Occupational History   Occupation: disabled  Tobacco Use   Smoking status: Never   Smokeless tobacco: Never  Vaping Use   Vaping status: Never Used  Substance and Sexual Activity  Alcohol use: Never   Drug use: Never   Sexual activity: Not Currently  Other Topics Concern   Not on file  Social History Narrative   Not on file   Social Drivers of Health   Financial Resource Strain: High Risk (05/13/2021)   Received from [facility], [facility]   Overall Financial Resource Strain (CARDIA)    Difficulty of Paying Living Expenses: Very hard  Food Insecurity: No Food Insecurity (12/11/2022)   Hunger Vital Sign    Worried About Running Out of Food in the Last Year: Never true    Ran Out of Food in the Last Year: Never true  Transportation Needs: No Transportation Needs (12/11/2022)   PRAPARE - Administrator, Civil Service (Medical): No    Lack of Transportation (Non-Medical): No  Physical Activity: Inactive (05/13/2021)   Received from [facility], [facility]   Exercise Vital Sign    Days of Exercise per Week: 0 days    Minutes of Exercise per Session: 0 min  Stress: Stress Concern Present (05/13/2021)   Received from [facility], [facility] - Occupational Stress Questionnaire    Feeling of Stress : Very much  Social Connections: Unknown (07/13/2021)   Received from [facility], [facility]   Social Network    Social Network: Not on file    Allergies:  Allergies  Allergen Reactions    Lisinopril Other (See Comments)    cough cough     Objective:    Vital Signs:   Temp:  [97.8 F (36.6 C)-98.4 F (36.9 C)] 98 F (36.7 C) (01/17 0852) Pulse Rate:  [73-110] 100 (01/17 0852) Resp:  [19-20] 20 (01/17 0852) BP: (108-114)/(60-79) 110/68 (01/17 0852) SpO2:  [90 %-94 %] 94 % (01/17 0852) Weight:  [83 kg] 83 kg (01/17 0838)    Weight change: Filed Weights   03/26/23 2349 03/27/23 0838  Weight: 83 kg 83 kg    Intake/Output:  No intake or output data in the 24 hours ending 03/27/23 1237    Physical Exam    General:  Well appearing. No resp difficulty HEENT: normal Neck: supple. JVP . Carotids 2+ bilat; no bruits. No lymphadenopathy or thyromegaly appreciated. Cor: PMI nondisplaced. Regular rate & rhythm. No rubs, gallops or murmurs. Lungs: clear Abdomen: soft, nontender, nondistended. No hepatosplenomegaly. No bruits or masses. Good bowel sounds. Extremities: no cyanosis, clubbing, rash, edema Neuro: alert & orientedx3, cranial nerves grossly intact. moves all 4 extremities w/o difficulty. Affect pleasant   Telemetry   ***  EKG    ***  Labs   Basic Metabolic Panel: Recent Labs  Lab 03/27/23 0003  NA 135  K 3.5  CL 102  CO2 21*  GLUCOSE 151*  BUN 21*  CREATININE 0.64  CALCIUM 9.2    Liver Function Tests: Recent Labs  Lab 03/27/23 0003  AST 27  ALT 30  ALKPHOS 66  BILITOT 0.9  PROT 7.0  ALBUMIN 4.2   Recent Labs  Lab 03/27/23 0003  LIPASE 35   No results for input(s): "AMMONIA" in the last 168 hours.  CBC: Recent Labs  Lab 03/27/23 0003  WBC 13.0*  HGB 14.3  HCT 41.8  MCV 85.5  PLT 312    Cardiac Enzymes: No results for input(s): "CKTOTAL", "CKMB", "CKMBINDEX", "TROPONINI" in the last 168 hours.  BNP: BNP (last 3 results) Recent Labs    12/22/22 1212 12/31/22 1640 02/26/23 1624  BNP 38.0 41.9 24.0  ProBNP (last 3 results) No results for input(s): "PROBNP" in the last 8760 hours.   CBG: No results  for input(s): "GLUCAP" in the last 168 hours.  Coagulation Studies: No results for input(s): "LABPROT", "INR" in the last 72 hours.   Imaging   CT ABDOMEN PELVIS W CONTRAST Result Date: 03/27/2023 CLINICAL DATA:  Acute right lower quadrant abdominal pain. EXAM: CT ABDOMEN AND PELVIS WITH CONTRAST TECHNIQUE: Multidetector CT imaging of the abdomen and pelvis was performed using the standard protocol following bolus administration of intravenous contrast. RADIATION DOSE REDUCTION: This exam was performed according to the departmental dose-optimization program which includes automated exposure control, adjustment of the mA and/or kV according to patient size and/or use of iterative reconstruction technique. CONTRAST:  80mL OMNIPAQUE IOHEXOL 300 MG/ML  SOLN COMPARISON:  December 15, 2022. FINDINGS: Lower chest: No acute abnormality. Hepatobiliary: No focal liver abnormality is seen. No gallstones, gallbladder wall thickening, or biliary dilatation. Pancreas: Unremarkable. No pancreatic ductal dilatation or surrounding inflammatory changes. Spleen: Normal in size without focal abnormality. Adrenals/Urinary Tract: Adrenal glands are unremarkable. Kidneys are normal, without renal calculi, focal lesion, or hydronephrosis. Bladder is unremarkable. Stomach/Bowel: The stomach is unremarkable. There is no evidence of bowel obstruction. The appendix is enlarged and inflamed measuring 15 mm consistent with acute appendicitis. No definite abscess formation is noted. Vascular/Lymphatic: No significant vascular findings are present. No enlarged abdominal or pelvic lymph nodes. Reproductive: 2 cm posterior uterine fibroid is noted. No adnexal abnormality is noted. Other: No abdominal wall hernia or abnormality. No abdominopelvic ascites. Musculoskeletal: No acute or significant osseous findings. IMPRESSION: Findings consistent with acute appendicitis. No definite abscess formation is noted. Electronically Signed   By: Lupita Raider M.D.   On: 03/27/2023 10:53   DG Chest 2 View Result Date: 03/27/2023 CLINICAL DATA:  Abdominal pain with nausea, vomiting and shortness of breath. EXAM: CHEST - 2 VIEW COMPARISON:  December 23, 2022 FINDINGS: The heart size and mediastinal contours are within normal limits. Both lungs are clear. The visualized skeletal structures are unremarkable. IMPRESSION: No active cardiopulmonary disease. Electronically Signed   By: Aram Candela M.D.   On: 03/27/2023 00:26     Medications:     Current Medications:   Infusions:     Patient Profile    Tonya Odonnell is a 58 year old with a history of pulmonary hypertension, RV failure, breast cancer, fibromyalgia,  hypothyroidism, and RV failure.   Admitted with acute appendicitis. Surgical clearance requested by Advanced Heart Failure Team.   Assessment/Plan  1. Acute Appendicitis   WBC 13K.  CT abd/pelvis  with sign of acure appendicitis. General Surgery consulted. Transfer to [facility] for surgical clearance. High surgical risk but not prohibitive.  Recommend admit to [facility] after surgery with swan.   2. Pulmonary Hypertension/RV Failure  [facility] 12/2022- PVR 7.4 On triple therapy with adcirca, ambrisentan, and selexipag.    Length of Stay: 0  Tonye Becket, NP  03/27/2023, 12:37 PM  Advanced Heart Failure Team Pager [phone] (M-F; 7a - 5p)  Please contact [facility] for night-coverage after hours (4p -7a ) and weekends on [email or web address]

## 2023-03-27 NOTE — ED Provider Notes (Addendum)
  Physical Exam  BP 117/77 (BP Location: Right Arm)   Pulse (!) 112   Temp 99 F (37.2 C) (Oral)   Resp 19   LMP  (LMP Unknown)   SpO2 92%   Physical Exam  Procedures  Procedures  ED Course / MDM    Medical Decision Making Patient is transferred from [facility] for appendicitis.  Patient has pulmonary hypertension requiring 3 L at baseline.  Surgery evaluated patient at [facility] and unfortunately anesthesia is uncomfortable without cardiology clearance.  Patient is sent here for cardiology clearance and surgery evaluation  3:17 PM Tresa Endo from surgery saw patient.  They want to defer to heart failure service.  Amy from heart failure service is at bedside to see patient  4:42 PM Heart failure service saw patient and request echo.  Patient at this point will be admitted to the medicine service.  She is unsure about surgery right now and would like to try antibiotics.  Surgery would like to keep her n.p.o. and ordered Rocephin and Flagyl.  At this point medicine service to admit and follow-up heart failure and surgery consults  6:01 PM Dr. Cliffton Asters saw patient. Patient now agreeable for surgery.  Cardiology plans to perform echo and patient will likely need a Swan-Ganz in the OR.  I discussed case with the hospitalist who states that patient will likely need to be monitored in the ICU afterwards  Problems Addressed: Appendicitis, unspecified appendicitis type: acute illness or injury Pulmonary hypertension (HCC): acute illness or injury  Amount and/or Complexity of Data Reviewed Labs: ordered. Decision-making details documented in ED Course. Radiology: ordered and independent interpretation performed. Decision-making details documented in ED Course.          Charlynne Pander, MD 03/27/23 1643    Charlynne Pander, MD 03/27/23 [phone]

## 2023-03-27 NOTE — ED Notes (Signed)
Per flow manager will release after 5pm, when Dr. Artis Flock comes in I informed Dr Silverio Lay of this situation as well.

## 2023-03-27 NOTE — Anesthesia Procedure Notes (Signed)
Arterial Line Insertion Start/End1/17/2025 9:55 PM, 03/27/2023 10:03 PM  Patient location: OR. Preanesthetic checklist: patient identified, IV checked, site marked, risks and benefits discussed, surgical consent, monitors and equipment checked, pre-op evaluation, timeout performed and anesthesia consent Patient sedated Right, radial was placed Catheter size: 20 G Hand hygiene performed  and maximum sterile barriers used   Attempts: 3 Procedure performed without using ultrasound guided technique. Following insertion, Biopatch and dressing applied. Post procedure assessment: normal  Patient tolerated the procedure well with no immediate complications.

## 2023-03-27 NOTE — ED Notes (Signed)
Pt was transported to PACU and bedside report given.

## 2023-03-27 NOTE — ED Notes (Signed)
States she developed pain last pm Pain is main to abd   slight nausea/vomiting  Was afebrile on arrival

## 2023-03-27 NOTE — Transfer of Care (Signed)
Immediate Anesthesia Transfer of Care Note  Patient: Tonya Odonnell  Procedure(s) Performed: APPENDECTOMY LAPAROSCOPIC (Abdomen)  Patient Location: PACU  Anesthesia Type:General  Level of Consciousness: awake, alert , and oriented  Airway & Oxygen Therapy: Patient Spontanous Breathing and Patient connected to nasal cannula oxygen  Post-op Assessment: Report given to RN, Post -op Vital signs reviewed and stable, and Patient moving all extremities  Post vital signs: Reviewed and stable  Last Vitals:  Vitals Value Taken Time  BP 140/95 03/27/23 2305  Temp    Pulse 103 03/27/23 2311  Resp 20 03/27/23 2311  SpO2 94 % 03/27/23 2311  Vitals shown include unfiled device data.  Last Pain:  Vitals:   03/27/23 1915  TempSrc: Oral  PainSc:          Complications: No notable events documented.

## 2023-03-27 NOTE — Progress Notes (Signed)
Brief PCCM Progress Note   Discussed case with Heart failure, no immediate needs from critical care standpoint. PCCM will be available as needed post op  Tonya Brazel D. Harris, NP-C [facility] Personal contact information can be found on Amion  If no contact or response made please call [phone] 03/27/2023, 6:58 PM

## 2023-03-28 ENCOUNTER — Encounter (HOSPITAL_COMMUNITY): Payer: Self-pay | Admitting: Surgery

## 2023-03-28 DIAGNOSIS — I272 Pulmonary hypertension, unspecified: Secondary | ICD-10-CM | POA: Diagnosis not present

## 2023-03-28 LAB — CBC
HCT: 40.1 % (ref 36.0–46.0)
Hemoglobin: 13.5 g/dL (ref 12.0–15.0)
MCH: 29 pg (ref 26.0–34.0)
MCHC: 33.7 g/dL (ref 30.0–36.0)
MCV: 86.2 fL (ref 80.0–100.0)
Platelets: 294 10*3/uL (ref 150–400)
RBC: 4.65 MIL/uL (ref 3.87–5.11)
RDW: 13.7 % (ref 11.5–15.5)
WBC: 9.8 10*3/uL (ref 4.0–10.5)
nRBC: 0 % (ref 0.0–0.2)

## 2023-03-28 LAB — BASIC METABOLIC PANEL
Anion gap: 11 (ref 5–15)
BUN: 15 mg/dL (ref 6–20)
CO2: 22 mmol/L (ref 22–32)
Calcium: 9 mg/dL (ref 8.9–10.3)
Chloride: 106 mmol/L (ref 98–111)
Creatinine, Ser: 0.86 mg/dL (ref 0.44–1.00)
GFR, Estimated: >60 mL/min (ref >60)
Glucose, Bld: 133 mg/dL — ABNORMAL HIGH (ref 70–99)
Potassium: 3.6 mmol/L (ref 3.5–5.1)
Sodium: 139 mmol/L (ref 135–145)

## 2023-03-28 LAB — HEPATIC FUNCTION PANEL
ALT: 26 U/L (ref 0–44)
AST: 26 U/L (ref 15–41)
Albumin: 3.3 g/dL — ABNORMAL LOW (ref 3.5–5.0)
Alkaline Phosphatase: 59 U/L (ref 38–126)
Bilirubin, Direct: 0.2 mg/dL (ref 0.0–0.2)
Indirect Bilirubin: 0.8 mg/dL (ref 0.3–0.9)
Total Bilirubin: 1 mg/dL (ref 0.0–1.2)
Total Protein: 6.3 g/dL — ABNORMAL LOW (ref 6.5–8.1)

## 2023-03-28 MED ORDER — TADALAFIL 20 MG PO TABS
40.0000 mg | ORAL_TABLET | Freq: Every day | ORAL | Status: DC
  Administered 2023-03-28 – 2023-03-29 (×2): 40 mg via ORAL
  Filled 2023-03-28 (×3): qty 2

## 2023-03-28 MED ORDER — LEVOTHYROXINE SODIUM 112 MCG PO TABS
112.0000 ug | ORAL_TABLET | Freq: Every day | ORAL | Status: DC
  Administered 2023-03-29: 112 ug via ORAL
  Filled 2023-03-28 (×2): qty 1

## 2023-03-28 MED ORDER — CROMOLYN SODIUM 4 % OP SOLN
1.0000 [drp] | Freq: Four times a day (QID) | OPHTHALMIC | Status: DC
  Administered 2023-03-28: 1 [drp] via OPHTHALMIC
  Filled 2023-03-28: qty 10

## 2023-03-28 MED ORDER — ENOXAPARIN SODIUM 40 MG/0.4ML IJ SOSY
40.0000 mg | PREFILLED_SYRINGE | INTRAMUSCULAR | Status: DC
  Administered 2023-03-28 – 2023-03-29 (×2): 40 mg via SUBCUTANEOUS
  Filled 2023-03-28 (×2): qty 0.4

## 2023-03-28 MED ORDER — FUROSEMIDE 40 MG PO TABS
40.0000 mg | ORAL_TABLET | Freq: Every day | ORAL | Status: DC
  Administered 2023-03-28 – 2023-03-29 (×2): 40 mg via ORAL
  Filled 2023-03-28 (×2): qty 1

## 2023-03-28 MED ORDER — HYDRALAZINE HCL 25 MG PO TABS
25.0000 mg | ORAL_TABLET | Freq: Two times a day (BID) | ORAL | Status: DC
  Administered 2023-03-28 – 2023-03-29 (×3): 25 mg via ORAL
  Filled 2023-03-28 (×3): qty 1

## 2023-03-28 MED ORDER — AMBRISENTAN 5 MG PO TABS
5.0000 mg | ORAL_TABLET | Freq: Every day | ORAL | Status: DC
  Administered 2023-03-29: 5 mg via ORAL
  Filled 2023-03-28 (×2): qty 1

## 2023-03-28 MED ORDER — AMBRISENTAN 5 MG PO TABS: 5.0000 mg | ORAL_TABLET | Freq: Every day | ORAL | Status: DC

## 2023-03-28 MED ORDER — POTASSIUM CHLORIDE CRYS ER 20 MEQ PO TBCR
20.0000 meq | EXTENDED_RELEASE_TABLET | Freq: Every day | ORAL | Status: DC
  Administered 2023-03-28 – 2023-03-29 (×2): 20 meq via ORAL
  Filled 2023-03-28 (×2): qty 1

## 2023-03-28 NOTE — Plan of Care (Signed)
  Problem: Health Behavior/Discharge Planning: Goal: Ability to manage health-related needs will improve Outcome: Progressing   Problem: Education: Goal: Knowledge of General Education information will improve Description: Including pain rating scale, medication(s)/side effects and non-pharmacologic comfort measures Outcome: Progressing   Problem: Clinical Measurements: Goal: Will remain free from infection Outcome: Progressing   Problem: Clinical Measurements: Goal: Respiratory complications will improve Outcome: Progressing   Problem: Clinical Measurements: Goal: Cardiovascular complication will be avoided Outcome: Progressing

## 2023-03-28 NOTE — Progress Notes (Signed)
Patient ID: Tonya Odonnell, female   DOB: 1966-02-09, 58 y.o.   MRN: [id number]     Advanced Heart Failure Rounding Note  Cardiologist: None  Chief Complaint: abdominal pain Subjective:    Patient had uncomplicated laparoscopic appendectomy yesterday.  Has some abdominal soreness today, has started diet.  On 2L [place].    Objective:   Weight Range: 83 kg Body mass index is 32.41 kg/m.   Vital Signs:   Temp:  [98.2 F (36.8 C)-99.6 F (37.6 C)] 98.2 F (36.8 C) (01/18 0755) Pulse Rate:  [81-112] 83 (01/18 0755) Resp:  [17-22] 18 (01/18 0755) BP: (106-140)/(67-109) 114/71 (01/18 0755) SpO2:  [90 %-96 %] 93 % (01/18 0755) Weight:  [83 kg] 83 kg (01/18 0105)    Weight change: Filed Weights   03/28/23 0105  Weight: 83 kg    Intake/Output:   Intake/Output Summary (Last 24 hours) at 03/28/2023 1036 Last data filed at 03/28/2023 0500 Gross per 24 hour  Intake 1028 ml  Output --  Net 1028 ml      Physical Exam    General:  Well appearing. No resp difficulty HEENT: Normal Neck: Thick. JVP 8-9 cm. Carotids 2+ bilat; no bruits. No lymphadenopathy or thyromegaly appreciated. Cor: PMI nondisplaced. Regular rate & rhythm. No rubs, gallops or murmurs. Lungs: Clear Abdomen: Soft, nontender, nondistended. No hepatosplenomegaly. No bruits or masses. Good bowel sounds. Extremities: No cyanosis, clubbing, rash, edema Neuro: Alert & orientedx3, cranial nerves grossly intact. moves all 4 extremities w/o difficulty. Affect pleasant   Telemetry   NSR (personally reviewed)  Labs    CBC Recent Labs    03/27/23 0003 03/28/23 0646  WBC 13.0* 9.8  HGB 14.3 13.5  HCT 41.8 40.1  MCV 85.5 86.2  PLT 312 294   Basic Metabolic Panel Recent Labs    28/41/32 0003 03/28/23 0646  NA 135 139  K 3.5 3.6  CL 102 106  CO2 21* 22  GLUCOSE 151* 133*  BUN 21* 15  CREATININE 0.64 0.86  CALCIUM 9.2 9.0   Liver Function Tests Recent Labs    03/27/23 0003  AST 27  ALT 30  ALKPHOS  66  BILITOT 0.9  PROT 7.0  ALBUMIN 4.2   Recent Labs    03/27/23 0003  LIPASE 35   Cardiac Enzymes No results for input(s): "CKTOTAL", "CKMB", "CKMBINDEX", "TROPONINI" in the last 72 hours.  BNP: BNP (last 3 results) Recent Labs    12/31/22 1640 02/26/23 1624 03/27/23 1644  BNP 41.9 24.0 26.9    ProBNP (last 3 results) No results for input(s): "PROBNP" in the last 8760 hours.   D-Dimer No results for input(s): "DDIMER" in the last 72 hours. Hemoglobin A1C No results for input(s): "HGBA1C" in the last 72 hours. Fasting Lipid Panel No results for input(s): "CHOL", "HDL", "LDLCALC", "TRIG", "CHOLHDL", "LDLDIRECT" in the last 72 hours. Thyroid Function Tests No results for input(s): "TSH", "T4TOTAL", "T3FREE", "THYROIDAB" in the last 72 hours.  Invalid input(s): "FREET3"  Other results:   Imaging    No results found.   Medications:     Scheduled Medications:  acetaminophen  1,000 mg Oral Q6H   ambrisentan  5 mg Oral Daily   cromolyn  1 drop Both Eyes QID   enoxaparin (LOVENOX) injection  40 mg Subcutaneous Q24H   fentaNYL       furosemide  40 mg Oral Daily   hydrALAZINE  25 mg Oral BID   levothyroxine  112 mcg Oral Q0600   potassium  chloride SA  20 mEq Oral Daily   tadalafil (PAH)  40 mg Oral Daily    Infusions:  acetaminophen     cefTRIAXone (ROCEPHIN)  IV      PRN Medications: acetaminophen, fentaNYL, morphine injection, ondansetron (ZOFRAN) IV, oxyCODONE, oxyCODONE   Assessment/Plan   1. Acute appendicitis: S/p laparoscopic appendectomy.  Uncomplicated. Has resumed diet.  - Post-op care per surgery.  2. Pulmonary hypertension: Patient has group 1 PAH, currently on triple pulmonary vasodilator therapy and had been doing well at home.  NYHA class II symptoms at worst, able to shovel snow last weekend.  No syncope/lightheadedness.  BNP and HS-TnI were normal yesterday. She is currently on 2L [place] post-op, has some mild volume overload.  - Wean  off oxygen.  - Resume home Lasix 40 mg daily.  - Resume home PH medications => tadalafil 20 mg daily, ambrisentan 5 mg daily, Uptravi 200 mcg bid.   If she can wean off oxygen, probably ok for discharge.  However, she is anxious about going home today and would alternatively be reasonable to watch her overnight.   Length of Stay: 1  Marca Ancona, MD  03/28/2023, 10:36 AM  Advanced Heart Failure Team Pager [phone] (M-F; 7a - 5p)  Please contact [facility] for night-coverage after hours (5p -7a ) and weekends on [email or web address]

## 2023-03-28 NOTE — Progress Notes (Signed)
Monitoring by Pharmacy for Pulmonary Hypertension Treatment   Indication - Continuation of prior to admission medication   Patient is 58 y.o.  with history of PAH on chronic ambrisentan (LETAIRIS) PTA and will be continued while hospitalized.   Continuing this medication order as an inpatient requires that monitoring parameters per REMS requirements must be met.  Chronic therapy is under the supervision of Dr. Marca Ancona and he has re-initiated this medication for the patient while admitted. Unable to verify enrollment with REMS program as the manufacturer is closed on the weekend. Will follow-up on Monday verify REMS Provider ID.  Unable to verify the patient's authorization code or REMS patient ID as the manufacturer is closed on the weekend. Will follow-up on Monday to verify this information.  Per patient report has previously been educated on Pregnancy risk and Hepatotoxicity.  On admission pregnancy risk has been assessed and Patient is female and cannot get pregnant as patient is postmenopausal.  Hepatic function has been evaluated. AST / ALT appropriate to continue medication at this time.     Latest Ref Rng & Units 03/27/2023   12:03 AM 12/10/2022    8:21 PM 09/17/2022    8:01 AM  Hepatic Function  Total Protein 6.5 - 8.1 g/dL 7.0  6.8  7.0   Albumin 3.5 - 5.0 g/dL 4.2  4.0  3.9   AST 15 - 41 U/L 27  20  16    ALT 0 - 44 U/L 30  25  26    Alk Phosphatase 38 - 126 U/L 66  58  67   Total Bilirubin 0.0 - 1.2 mg/dL 0.9  2.1  1.0   Bilirubin, Direct 0.0 - 0.2 mg/dL  0.3      If any question arise or pregnancy is identified during hospitalization, contact for bosentan: [phone]; macitentan: [phone]; ambrisentan: [phone].  Thank for you allowing [place] to participate in the care of this patient.  Enos Fling, PharmD PGY-1 Acute Care Pharmacy Resident 03/28/2023 11:21 AM  Guides for Female Patient:  ambrisentan (LETAIRIS), macitentan (OPSUMIT), bosentan  (TRACLEER).

## 2023-03-28 NOTE — Anesthesia Postprocedure Evaluation (Signed)
Anesthesia Post Note  Patient: Animal nutritionist  Procedure(s) Performed: APPENDECTOMY LAPAROSCOPIC (Abdomen)     Patient location during evaluation: PACU Anesthesia Type: General Level of consciousness: awake and alert Pain management: pain level controlled Vital Signs Assessment: post-procedure vital signs reviewed and stable Respiratory status: spontaneous breathing, nonlabored ventilation, respiratory function stable and patient connected to nasal cannula oxygen Cardiovascular status: blood pressure returned to baseline and stable Postop Assessment: no apparent nausea or vomiting Anesthetic complications: no   No notable events documented.  Last Vitals:  Vitals:   03/28/23 0000 03/28/23 0033  BP: 110/88 123/76  Pulse: (!) 101 100  Resp: 20 18  Temp: 36.9 C 36.9 C  SpO2: 93% 95%    Last Pain:  Vitals:   03/27/23 2330  TempSrc:   PainSc: Asleep                 Nelle Don [place] Gipe

## 2023-03-28 NOTE — Discharge Instructions (Signed)

## 2023-03-28 NOTE — Progress Notes (Addendum)
1 Day Post-Op  Subjective: Doing well post op.  Some soreness, but otherwise feeling ok. Hungry and ready to eat.  Has voided since surgery   Objective: Vital signs in last 24 hours: Temp:  [98 F (36.7 C)-99.6 F (37.6 C)] 99.6 F (37.6 C) (01/18 0446) Pulse Rate:  [81-120] 81 (01/18 0446) Resp:  [17-22] 17 (01/18 0446) BP: (106-140)/(67-109) 106/68 (01/18 0446) SpO2:  [90 %-96 %] 93 % (01/18 0446) Weight:  [83 kg] 83 kg (01/18 0105)    Intake/Output from previous day: 01/17 0701 - 01/18 0700 In: 1028 [P.O.:228; I.V.:800] Out: -  Intake/Output this shift: No intake/output data recorded.  PE: Gen: NAD Lungs: respiratory effort nonlabored, pulling 1500 on IS, 2L [place] in place Abd: soft, appropriately tender, ND, incisions c/d/i  Lab Results:  Recent Labs    03/27/23 0003  WBC 13.0*  HGB 14.3  HCT 41.8  PLT 312   BMET Recent Labs    03/27/23 0003  NA 135  K 3.5  CL 102  CO2 21*  GLUCOSE 151*  BUN 21*  CREATININE 0.64  CALCIUM 9.2   PT/INR No results for input(s): "LABPROT", "INR" in the last 72 hours. CMP     Component Value Date/Time   NA 135 03/27/2023 0003   NA 141 01/20/2023 1434   K 3.5 03/27/2023 0003   CL 102 03/27/2023 0003   CO2 21 (L) 03/27/2023 0003   GLUCOSE 151 (H) 03/27/2023 0003   BUN 21 (H) 03/27/2023 0003   BUN 15 01/20/2023 1434   CREATININE 0.64 03/27/2023 0003   CREATININE 0.77 09/17/2022 0801   CALCIUM 9.2 03/27/2023 0003   PROT 7.0 03/27/2023 0003   PROT 6.7 07/08/2022 1106   ALBUMIN 4.2 03/27/2023 0003   ALBUMIN 4.4 07/08/2022 1106   AST 27 03/27/2023 0003   AST 16 09/17/2022 0801   ALT 30 03/27/2023 0003   ALT 26 09/17/2022 0801   ALKPHOS 66 03/27/2023 0003   BILITOT 0.9 03/27/2023 0003   BILITOT 1.0 09/17/2022 0801   GFRNONAA >60 03/27/2023 0003   GFRNONAA >60 09/17/2022 0801   Lipase     Component Value Date/Time   LIPASE 35 03/27/2023 0003       Studies/Results: CT ABDOMEN PELVIS W  CONTRAST Result Date: 03/27/2023 CLINICAL DATA:  Acute right lower quadrant abdominal pain. EXAM: CT ABDOMEN AND PELVIS WITH CONTRAST TECHNIQUE: Multidetector CT imaging of the abdomen and pelvis was performed using the standard protocol following bolus administration of intravenous contrast. RADIATION DOSE REDUCTION: This exam was performed according to the departmental dose-optimization program which includes automated exposure control, adjustment of the mA and/or kV according to patient size and/or use of iterative reconstruction technique. CONTRAST:  80mL OMNIPAQUE IOHEXOL 300 MG/ML  SOLN COMPARISON:  December 15, 2022. FINDINGS: Lower chest: No acute abnormality. Hepatobiliary: No focal liver abnormality is seen. No gallstones, gallbladder wall thickening, or biliary dilatation. Pancreas: Unremarkable. No pancreatic ductal dilatation or surrounding inflammatory changes. Spleen: Normal in size without focal abnormality. Adrenals/Urinary Tract: Adrenal glands are unremarkable. Kidneys are normal, without renal calculi, focal lesion, or hydronephrosis. Bladder is unremarkable. Stomach/Bowel: The stomach is unremarkable. There is no evidence of bowel obstruction. The appendix is enlarged and inflamed measuring 15 mm consistent with acute appendicitis. No definite abscess formation is noted. Vascular/Lymphatic: No significant vascular findings are present. No enlarged abdominal or pelvic lymph nodes. Reproductive: 2 cm posterior uterine fibroid is noted. No adnexal abnormality is noted. Other: No abdominal wall hernia or  abnormality. No abdominopelvic ascites. Musculoskeletal: No acute or significant osseous findings. IMPRESSION: Findings consistent with acute appendicitis. No definite abscess formation is noted. Electronically Signed   By: Lupita Raider M.D.   On: 03/27/2023 10:53   DG Chest 2 View Result Date: 03/27/2023 CLINICAL DATA:  Abdominal pain with nausea, vomiting and shortness of breath. EXAM: CHEST  - 2 VIEW COMPARISON:  December 23, 2022 FINDINGS: The heart size and mediastinal contours are within normal limits. Both lungs are clear. The visualized skeletal structures are unremarkable. IMPRESSION: No active cardiopulmonary disease. Electronically Signed   By: Aram Candela M.D.   On: 03/27/2023 00:26    Anti-infectives: Anti-infectives (From admission, onward)    Start     Dose/Rate Route Frequency Ordered Stop   03/28/23 0600  cefTRIAXone (ROCEPHIN) 2 g in sodium chloride 0.9 % 100 mL IVPB        2 g 200 mL/hr over 30 Minutes Intravenous On call to O.R. 03/27/23 1537 03/29/23 0559   03/27/23 1545  metroNIDAZOLE (FLAGYL) IVPB 500 mg        500 mg 100 mL/hr over 60 Minutes Intravenous On call to O.R. 03/27/23 1537 03/28/23 0559   03/27/23 1515  metroNIDAZOLE (FLAGYL) IVPB 500 mg  Status:  Discontinued        500 mg 100 mL/hr over 60 Minutes Intravenous  Once 03/27/23 1512 03/27/23 1513        Assessment/Plan POD 1, s/p lap appy for acute appendicitis, Dr. Dossie Der 1/17  -doing well with pain control -has voided -needs to order breakfast to try and eat and drink some -needs to ambulate -wean O2 back to room air.  She states her baseline O2 sats at home are 93-94%  FEN - HH VTE - lovenox ID - zosyn in ED, Rocephin/Flagyl on call to OR, no further needed  Right HF Pulmonary HTN  - can resume home meds.  Pharmacy adjusting meds so can't reconcile these at the moment.  Suspect HF team will likely restart these today  Patient is surgically stable for DC home later today after eating her diet.  Will await HF team evaluation to determine when they feel she is safe for DC home.    LOS: 1 day    Letha Cape , [facility] 03/28/2023, 7:47 AM Please see Amion for pager number during day hours 7:00am-4:30pm or 7:00am -11:30am on weekends

## 2023-03-29 DIAGNOSIS — I272 Pulmonary hypertension, unspecified: Secondary | ICD-10-CM | POA: Diagnosis not present

## 2023-03-29 MED ORDER — PHENOL 1.4 % MT LIQD
1.0000 | OROMUCOSAL | Status: DC | PRN
  Administered 2023-03-29: 1 via OROMUCOSAL
  Filled 2023-03-29: qty 177

## 2023-03-29 MED ORDER — MENTHOL 3 MG MT LOZG: 1.0000 | LOZENGE | OROMUCOSAL | Status: DC | PRN

## 2023-03-29 MED ORDER — ACETAMINOPHEN 500 MG PO TABS: 1000.0000 mg | ORAL_TABLET | Freq: Four times a day (QID) | ORAL | Status: DC | PRN

## 2023-03-29 MED ORDER — OXYCODONE HCL 5 MG PO TABS: 5.0000 mg | ORAL_TABLET | Freq: Four times a day (QID) | ORAL | 0 refills | Status: DC | PRN

## 2023-03-29 NOTE — Progress Notes (Signed)
2 Days Post-Op  Subjective: C/o throat pain today.  Tried to wean O2 yesterday.  RN states that she would drop to low 90s on RA.  We discussed that this is apparently the patient's baseline at home.  Patient is eating and tolerating.  She is anxious about going home, unclear why she's so anxious as she looks good.   Objective: Vital signs in last 24 hours: Temp:  [98.1 F (36.7 C)-98.7 F (37.1 C)] 98.1 F (36.7 C) (01/19 0729) Pulse Rate:  [79-108] 84 (01/19 0729) Resp:  [18] 18 (01/19 0421) BP: (93-114)/(61-71) 101/68 (01/19 0729) SpO2:  [93 %-98 %] 95 % (01/19 0729)    Intake/Output from previous day: No intake/output data recorded. Intake/Output this shift: No intake/output data recorded.  PE: Gen: NAD Lungs: respiratory effort nonlabored, CTAB.  O2 in place, turned down to 1L to wean to RA Abd: soft, appropriately tender, ND, incisions c/d/i  Lab Results:  Recent Labs    03/27/23 0003 03/28/23 0646  WBC 13.0* 9.8  HGB 14.3 13.5  HCT 41.8 40.1  PLT 312 294   BMET Recent Labs    03/27/23 0003 03/28/23 0646  NA 135 139  K 3.5 3.6  CL 102 106  CO2 21* 22  GLUCOSE 151* 133*  BUN 21* 15  CREATININE 0.64 0.86  CALCIUM 9.2 9.0   PT/INR No results for input(s): "LABPROT", "INR" in the last 72 hours. CMP     Component Value Date/Time   NA 139 03/28/2023 0646   NA 141 01/20/2023 1434   K 3.6 03/28/2023 0646   CL 106 03/28/2023 0646   CO2 22 03/28/2023 0646   GLUCOSE 133 (H) 03/28/2023 0646   BUN 15 03/28/2023 0646   BUN 15 01/20/2023 1434   CREATININE 0.86 03/28/2023 0646   CREATININE 0.77 09/17/2022 0801   CALCIUM 9.0 03/28/2023 0646   PROT 6.3 (L) 03/28/2023 1209   PROT 6.7 07/08/2022 1106   ALBUMIN 3.3 (L) 03/28/2023 1209   ALBUMIN 4.4 07/08/2022 1106   AST 26 03/28/2023 1209   AST 16 09/17/2022 0801   ALT 26 03/28/2023 1209   ALT 26 09/17/2022 0801   ALKPHOS 59 03/28/2023 1209   BILITOT 1.0 03/28/2023 1209   BILITOT 1.0 09/17/2022 0801    GFRNONAA >60 03/28/2023 0646   GFRNONAA >60 09/17/2022 0801   Lipase     Component Value Date/Time   LIPASE 35 03/27/2023 0003       Studies/Results: CT ABDOMEN PELVIS W CONTRAST Result Date: 03/27/2023 CLINICAL DATA:  Acute right lower quadrant abdominal pain. EXAM: CT ABDOMEN AND PELVIS WITH CONTRAST TECHNIQUE: Multidetector CT imaging of the abdomen and pelvis was performed using the standard protocol following bolus administration of intravenous contrast. RADIATION DOSE REDUCTION: This exam was performed according to the departmental dose-optimization program which includes automated exposure control, adjustment of the mA and/or kV according to patient size and/or use of iterative reconstruction technique. CONTRAST:  80mL OMNIPAQUE IOHEXOL 300 MG/ML  SOLN COMPARISON:  December 15, 2022. FINDINGS: Lower chest: No acute abnormality. Hepatobiliary: No focal liver abnormality is seen. No gallstones, gallbladder wall thickening, or biliary dilatation. Pancreas: Unremarkable. No pancreatic ductal dilatation or surrounding inflammatory changes. Spleen: Normal in size without focal abnormality. Adrenals/Urinary Tract: Adrenal glands are unremarkable. Kidneys are normal, without renal calculi, focal lesion, or hydronephrosis. Bladder is unremarkable. Stomach/Bowel: The stomach is unremarkable. There is no evidence of bowel obstruction. The appendix is enlarged and inflamed measuring 15 mm consistent with acute  appendicitis. No definite abscess formation is noted. Vascular/Lymphatic: No significant vascular findings are present. No enlarged abdominal or pelvic lymph nodes. Reproductive: 2 cm posterior uterine fibroid is noted. No adnexal abnormality is noted. Other: No abdominal wall hernia or abnormality. No abdominopelvic ascites. Musculoskeletal: No acute or significant osseous findings. IMPRESSION: Findings consistent with acute appendicitis. No definite abscess formation is noted. Electronically Signed    By: Lupita Raider M.D.   On: 03/27/2023 10:53    Anti-infectives: Anti-infectives (From admission, onward)    Start     Dose/Rate Route Frequency Ordered Stop   03/28/23 0600  cefTRIAXone (ROCEPHIN) 2 g in sodium chloride 0.9 % 100 mL IVPB        2 g 200 mL/hr over 30 Minutes Intravenous On call to O.R. 03/27/23 1537 03/29/23 0559   03/27/23 1545  metroNIDAZOLE (FLAGYL) IVPB 500 mg        500 mg 100 mL/hr over 60 Minutes Intravenous On call to O.R. 03/27/23 1537 03/28/23 0559   03/27/23 1515  metroNIDAZOLE (FLAGYL) IVPB 500 mg  Status:  Discontinued        500 mg 100 mL/hr over 60 Minutes Intravenous  Once 03/27/23 1512 03/27/23 1513        Assessment/Plan POD 2, s/p lap appy for acute appendicitis, Dr. Dossie Der 1/17  - doing well with pain control - voiding - ambulate with RN without O2.  Patient was in the high 80s on admit to ED secondary to pain.  She is otherwise breathing well and doesn't really complain of SOB. - chloraseptic spray/lozenges for sore throat.  We discussed this can be from ETT.  We also discussed possibility that throat pain could be from a virus as well and that we can symptomatically treat this and monitor.   FEN - HH VTE - lovenox ID - zosyn in ED, Rocephin/Flagyl on call to OR, no further needed  Right HF Pulmonary HTN  - home meds resumed.  Patient is surgically stable for DC home.  We will have the RN walk her without O2 and see what her sats are.  If these are stable, then ok for dc from our standpoint unless not felt safe by HF team.  If so, will likely reach out to medical service to assist in patient care as patient is surgically stable.    LOS: 2 days    Letha Cape , [facility] 03/29/2023, 7:38 AM Please see Amion for pager number during day hours 7:00am-4:30pm or 7:00am -11:30am on weekends

## 2023-03-29 NOTE — Discharge Summary (Signed)
Patient ID: Tonya Odonnell [id number] 1965/10/23 58 y.o.  Admit date: 03/27/2023 Discharge date: 03/29/2023  Admitting Diagnosis: Appendicitis Pulmonary HTN Right heart failure  Discharge Diagnosis Patient Active Problem List   Diagnosis Date Noted   Appendicitis 03/27/2023   Thyroid mass 12/30/2022   Right ventricular failure (HCC) 12/13/2022   Unstable angina (HCC) 12/12/2022   Pulmonary hypertension, moderate to severe (HCC) 12/12/2022   Hemorrhoids 08/14/2022   Lichen sclerosus of female genitalia 12/25/2021   Adrenal adenoma, left 09/17/2021   Pain management contract signed 06/13/2021   Chronic pain syndrome 06/03/2021   Chronic arthralgias of knees and hips 05/06/2021   Osteopenia of lumbar spine 05/05/2021   Obesity 05/05/2021   Anxiety 12/05/2020   Gastroesophageal reflux disease without esophagitis 12/05/2020   Prediabetes 12/05/2020   Hyperlipidemia 02/08/2018   Hepatitis B core antibody positive 08/07/2017   History of breast cancer 12/13/2014   Chronic low back pain 02/17/2014   Fibromyalgia 10/26/2013   Adverse reaction to influenza vaccine 11/22/2012   Essential (primary) hypertension 05/05/2012   Hashimoto's thyroiditis 05/05/2012    Consultants Dr. Shirlee Latch, Heart failure team  Reason for Admission: This is a pleasant 58 yo female with a history of pulmonary HTN, RV failure, breast cancer, Hashimoto's, and fibromyalgia who started a new HF medication this past Monday. She then developed some chills and sweats and thought it may be related to this new medication. Yesterday around 1800 she developed epigastric/periumbilical abdominal pain that migrated to her lower abdomen along with some N/V. She again thought this may be related to her new medication. However, after Tylenol and pepto-bismol this did not improve. Her pain was sharp like glass shards. She called EMS and was taken to [facility] ED for evaluation. She has been found to have uncomplicated  appendicitis on CT scan with a WBC of 13K. She currently complains of more SOB in the last day secondary to her abdominal pain. Her O2 sats are normally 93-94%, but have been in the high 80s since last night. She is currently on 3L [place]. She denies any chest pain and states recently she has been walking better and mobilizing better. We have been asked to see her here at [facility] for further evaluation for her appendicitis.   Procedures Lap appy, Dr. Dossie Der 03/27/2023  Hospital Course:  The patient was admitted post op for observation secondary to her history of pulmonary HTN and right heart failure.  She was restarted on her home meds on POD1.  She did require some O2 via [place] initially.  She was able to be weaned off of this with sats in the low 90s which is her baseline according to what she told me on admission.  She denies SOB.  From a surgical standpoint, she is tolerating a diet with good pain control.  She is otherwise medically and surgically stable for DC home on POD 2.  Appropriate surgical follow up is being made through our office and will call her when the office opens.   Allergies as of 03/29/2023       Reactions   Lisinopril Other (See Comments)   cough cough        Medication List     TAKE these medications    acetaminophen 500 MG tablet Commonly known as: TYLENOL Take 2 tablets (1,000 mg total) by mouth every 6 (six) hours as needed.   ambrisentan 5 MG tablet Commonly known as: LETAIRIS Take 1 tablet (5 mg total) by mouth daily.  ascorbic acid 500 MG tablet Commonly known as: VITAMIN C Take 500 mg by mouth daily.   Azelaic Acid 15 % gel Apply to face every morning   cetirizine-pseudoephedrine 5-120 MG tablet Commonly known as: ZYRTEC-D Take 1 tablet by mouth 2 (two) times daily.   Cholecalciferol 25 MCG (1000 UT) tablet Take 1 tablet (1,000 Units total) by mouth daily.   clobetasol cream 0.05 % Commonly known as: TEMOVATE Apply 1 application topically 2  (two) times daily. Apply to affected area   cromolyn 4 % ophthalmic solution Commonly known as: OPTICROM Place 1 drop into both eyes 4 (four) times daily.   cyanocobalamin 1000 MCG tablet Commonly known as: VITAMIN B12 Take 1,000 mcg by mouth daily.   diltiazem 2 % Gel Apply 1 Application topically 3 (three) times daily. Inside the rectum as directed.   doxycycline 20 MG tablet Commonly known as: PERIOSTAT Take 1 tablet (20 mg total) by mouth 2 (two) times daily.   Elidel 1 % cream Generic drug: pimecrolimus Apply to red scaly areas on the face BID for seb derm   fluticasone 50 MCG/ACT nasal spray Commonly known as: FLONASE Place 2 sprays into both nostrils daily.   furosemide 40 MG tablet Commonly known as: LASIX Take 1 tablet (40 mg total) by mouth daily.   hydrALAZINE 25 MG tablet Commonly known as: APRESOLINE Take 1 tablet (25 mg total) by mouth 2 (two) times daily.   Ivermectin 1 % Crea Commonly known as: Soolantra Apply 1 Application topically at bedtime.   levothyroxine 112 MCG tablet Commonly known as: SYNTHROID Take 1 tablet (112 mcg total) by mouth daily.   omeprazole 40 MG capsule Commonly known as: PRILOSEC Take 1 capsule (40 mg total) by mouth daily.   oxyCODONE 5 MG immediate release tablet Commonly known as: Oxy IR/ROXICODONE Take 1 tablet (5 mg total) by mouth every 6 (six) hours as needed.   potassium chloride SA 20 MEQ tablet Commonly known as: KLOR-CON M Take 1 tablet (20 mEq total) by mouth daily.   tadalafil (PAH) 20 MG tablet Commonly known as: ADCIRCA Take 2 tablets (40 mg total) by mouth daily. NDC's covered by plan: 16109604540, 98119147829, [id number]   Uptravi 200 MCG Tabs Generic drug: Selexipag Take by mouth.   valACYclovir 1000 MG tablet Commonly known as: VALTREX TAKE 1 TABLET BY MOUTH EVERY DAY (INS ONLY COVERING 34 DAYS)          Follow-up Information     Maczis, Puja Gosai, PA-C Follow up in 3 week(s).    Specialty: General Surgery Why: Office will call you with a follow up appointment, If you don't hear from the office, please call, Arrive 30 minutes prior to your appointment time, Please bring your insurance card and photo ID Contact information: [address] [facility] [place] [address] [phone]         Marjie Skiff, NP Follow up.   Specialty: Nurse Practitioner Why: As needed Contact information: [address] [place] [address] [phone]                 Signed: Barnetta Chapel, [facility] 03/29/2023, 10:18 AM Please see Amion for pager number during day hours 7:00am-4:30pm, 7-11:30am on Weekends

## 2023-03-29 NOTE — Progress Notes (Signed)
Patient ID: Tonya Odonnell, female   DOB: 1965/08/30, 58 y.o.   MRN: [id number]     Advanced Heart Failure Rounding Note  Cardiologist: None  Chief Complaint: abdominal pain Subjective:    Patient is anxious today.  She walked on room air, mildly lightheaded with standing. BP 97/72.    Objective:   Weight Range: 83 kg Body mass index is 32.41 kg/m.   Vital Signs:   Temp:  [97.8 F (36.6 C)-98.7 F (37.1 C)] 97.8 F (36.6 C) (01/19 1142) Pulse Rate:  [79-108] 108 (01/19 1142) Resp:  [18] 18 (01/19 0421) BP: (93-114)/(61-72) 97/72 (01/19 1142) SpO2:  [91 %-98 %] 92 % (01/19 1142)    Weight change: Filed Weights   03/28/23 0105  Weight: 83 kg    Intake/Output:  No intake or output data in the 24 hours ending 03/29/23 1156     Physical Exam    General: NAD Neck: Thick, JVP 7-8, no thyromegaly or thyroid nodule.  Lungs: Clear to auscultation bilaterally with normal respiratory effort. CV: Nondisplaced PMI.  Heart regular S1/S2, no S3/S4, no murmur.  No peripheral edema.  \ Abdomen: Soft, nontender, no hepatosplenomegaly, no distention.  Skin: Intact without lesions or rashes.  Neurologic: Alert and oriented x 3.  Psych: Normal affect. Extremities: No clubbing or cyanosis.  HEENT: Normal.   Telemetry   NSR (personally reviewed)  Labs    CBC Recent Labs    03/27/23 0003 03/28/23 0646  WBC 13.0* 9.8  HGB 14.3 13.5  HCT 41.8 40.1  MCV 85.5 86.2  PLT 312 294   Basic Metabolic Panel Recent Labs    86/57/84 0003 03/28/23 0646  NA 135 139  K 3.5 3.6  CL 102 106  CO2 21* 22  GLUCOSE 151* 133*  BUN 21* 15  CREATININE 0.64 0.86  CALCIUM 9.2 9.0   Liver Function Tests Recent Labs    03/27/23 0003 03/28/23 1209  AST 27 26  ALT 30 26  ALKPHOS 66 59  BILITOT 0.9 1.0  PROT 7.0 6.3*  ALBUMIN 4.2 3.3*   Recent Labs    03/27/23 0003  LIPASE 35   Cardiac Enzymes No results for input(s): "CKTOTAL", "CKMB", "CKMBINDEX", "TROPONINI" in the last 72  hours.  BNP: BNP (last 3 results) Recent Labs    12/31/22 1640 02/26/23 1624 03/27/23 1644  BNP 41.9 24.0 26.9    ProBNP (last 3 results) No results for input(s): "PROBNP" in the last 8760 hours.   D-Dimer No results for input(s): "DDIMER" in the last 72 hours. Hemoglobin A1C No results for input(s): "HGBA1C" in the last 72 hours. Fasting Lipid Panel No results for input(s): "CHOL", "HDL", "LDLCALC", "TRIG", "CHOLHDL", "LDLDIRECT" in the last 72 hours. Thyroid Function Tests No results for input(s): "TSH", "T4TOTAL", "T3FREE", "THYROIDAB" in the last 72 hours.  Invalid input(s): "FREET3"  Other results:   Imaging    No results found.   Medications:     Scheduled Medications:  acetaminophen  1,000 mg Oral Q6H   ambrisentan  5 mg Oral Daily   cromolyn  1 drop Both Eyes QID   enoxaparin (LOVENOX) injection  40 mg Subcutaneous Q24H   furosemide  40 mg Oral Daily   hydrALAZINE  25 mg Oral BID   levothyroxine  112 mcg Oral Q0600   potassium chloride SA  20 mEq Oral Daily   tadalafil  40 mg Oral Daily    Infusions:    PRN Medications: menthol-cetylpyridinium, ondansetron (ZOFRAN) IV, oxyCODONE, oxyCODONE, phenol  Assessment/Plan   1. Acute appendicitis: S/p laparoscopic appendectomy.  Uncomplicated. Has resumed diet.  - Post-op care per surgery.  2. Pulmonary hypertension: Patient has group 1 PAH, currently on triple pulmonary vasodilator therapy and had been doing well at home.  NYHA class II symptoms at worst, able to shovel snow last weekend.   BNP and HS-TnI were normal at admission. She is now off oxygen.  Volume status looks ok.  - Resumed home Lasix 40 mg daily.  - Resumed home PH medications => tadalafil 20 mg daily, ambrisentan 5 mg daily, Uptravi 200 mcg bid.  3. HTN: SBP 90s-100s post-op.  - I told her not to take hydralazine this evening, can restart tomorrow as long as she is not lightheaded with standing.  If she has orthostatic symptoms,  stay off hydralazine for another day.   OK for home today from my standpoint.   Length of Stay: 2  Marca Ancona, MD  03/29/2023, 11:56 AM  Advanced Heart Failure Team Pager [phone] (M-F; 7a - 5p)  Please contact [facility] for night-coverage after hours (5p -7a ) and weekends on [email or web address]

## 2023-03-30 ENCOUNTER — Other Ambulatory Visit (HOSPITAL_COMMUNITY): Payer: Self-pay

## 2023-03-30 DIAGNOSIS — Z7689 Persons encountering health services in other specified circumstances: Secondary | ICD-10-CM | POA: Diagnosis not present

## 2023-03-30 NOTE — Progress Notes (Addendum)
Monitoring by Pharmacy for Pulmonary Hypertension Treatment   Indication - Continuation of prior to admission medication   Patient is 58 y.o.  with history of PAH on chronic ambrisentan (LETAIRIS) PTA and will be continued while hospitalized.   REMS information verified on 1/20.   Continuing this medication order as an inpatient requires that monitoring parameters per REMS requirements must be met.  Chronic therapy is under the supervision of Dr Marca Ancona who is enrolled in the REMS program (REMS Provider ID [id number]) and is being notified of continuation of therapy. A staff message in EPIC has been sent notifying the certified prescriber.  The patient's authorization code or REMS patient ID as provided by the REMS program is [id number] (authorization code is [id number]). Per patient report has previously been educated on Pregnancy risk and Hepatotoxicity.  On admission pregnancy risk has been assessed and Patient is female and cannot get pregnant (as get pregnant as patient is postmenopausal). Hepatic function has been evaluated. AST / ALT appropriate to continue medication at this time.     Latest Ref Rng & Units 03/28/2023   12:09 PM 03/27/2023   12:03 AM 12/10/2022    8:21 PM  Hepatic Function  Total Protein 6.5 - 8.1 g/dL 6.3  7.0  6.8   Albumin 3.5 - 5.0 g/dL 3.3  4.2  4.0   AST 15 - 41 U/L 26  27  20    ALT 0 - 44 U/L 26  30  25    Alk Phosphatase 38 - 126 U/L 59  66  58   Total Bilirubin 0.0 - 1.2 mg/dL 1.0  0.9  2.1   Bilirubin, Direct 0.0 - 0.2 mg/dL 0.2   0.3     If any question arise or pregnancy is identified during hospitalization, contact for bosentan: [phone]; macitentan: [phone]; ambrisentan: [phone].  Thank for you allowing [place] to participate in the care of this patient.  Thank you for allowing pharmacy to participate in this patient's care,  Sherron Monday, PharmD, BCCCP Clinical Pharmacist  Phone: [phone] 03/30/2023 8:17 AM  Guides for Female  Patient:  ambrisentan (LETAIRIS), macitentan (OPSUMIT), bosentan (TRACLEER).

## 2023-03-31 LAB — SURGICAL PATHOLOGY

## 2023-04-01 ENCOUNTER — Other Ambulatory Visit (HOSPITAL_COMMUNITY): Payer: Self-pay

## 2023-04-02 ENCOUNTER — Other Ambulatory Visit (HOSPITAL_COMMUNITY): Payer: Self-pay

## 2023-04-02 ENCOUNTER — Other Ambulatory Visit: Payer: Medicaid Other

## 2023-04-03 ENCOUNTER — Other Ambulatory Visit (HOSPITAL_COMMUNITY): Payer: Self-pay | Admitting: Pharmacy Technician

## 2023-04-03 ENCOUNTER — Other Ambulatory Visit: Payer: Self-pay

## 2023-04-03 ENCOUNTER — Other Ambulatory Visit (HOSPITAL_COMMUNITY): Payer: Self-pay

## 2023-04-03 DIAGNOSIS — Z7689 Persons encountering health services in other specified circumstances: Secondary | ICD-10-CM | POA: Diagnosis not present

## 2023-04-03 NOTE — Progress Notes (Signed)
Specialty Pharmacy Refill Coordination Note  Tonya Odonnell is a 58 y.o. female contacted today regarding refills of specialty medication(s) Ambrisentan [name]   Patient requested Delivery   Delivery date: 04/07/23   Verified address: [address] [place]   Medication will be filled on 04/06/23.  Medications has to be ordered & message sent to Delice Bison to order.   MD REMS [address] REMS [address] AUTH # [id number] EXP 04/10/2023

## 2023-04-06 ENCOUNTER — Other Ambulatory Visit: Payer: Self-pay

## 2023-04-07 ENCOUNTER — Other Ambulatory Visit: Payer: Self-pay

## 2023-04-07 NOTE — Progress Notes (Signed)
Ambrisentan shipped 04/07/23  Transaction ID: [id number]  Dispense Authorization code: [id number]  Rems form submitted successfully via [employer]

## 2023-04-08 ENCOUNTER — Ambulatory Visit: Payer: Medicaid Other | Admitting: "Endocrinology

## 2023-04-08 ENCOUNTER — Other Ambulatory Visit: Payer: Self-pay | Admitting: Dermatology

## 2023-04-08 DIAGNOSIS — Z7689 Persons encountering health services in other specified circumstances: Secondary | ICD-10-CM | POA: Diagnosis not present

## 2023-04-09 ENCOUNTER — Encounter (HOSPITAL_COMMUNITY): Payer: Self-pay | Admitting: Cardiology

## 2023-04-09 ENCOUNTER — Other Ambulatory Visit (HOSPITAL_COMMUNITY): Payer: Self-pay

## 2023-04-09 ENCOUNTER — Other Ambulatory Visit: Payer: Self-pay

## 2023-04-09 ENCOUNTER — Ambulatory Visit (HOSPITAL_COMMUNITY)
Admission: RE | Admit: 2023-04-09 | Discharge: 2023-04-09 | Disposition: A | Discharge status: Home / Self Care | Payer: Medicaid Other | Source: Ambulatory Visit | Attending: Cardiology | Admitting: Cardiology

## 2023-04-09 VITALS — BP 118/80 | HR 108 | Wt 183.2 lb

## 2023-04-09 DIAGNOSIS — M797 Fibromyalgia: Secondary | ICD-10-CM | POA: Diagnosis not present

## 2023-04-09 DIAGNOSIS — I10 Essential (primary) hypertension: Secondary | ICD-10-CM | POA: Diagnosis not present

## 2023-04-09 DIAGNOSIS — I50810 Right heart failure, unspecified: Secondary | ICD-10-CM

## 2023-04-09 DIAGNOSIS — Z79899 Other long term (current) drug therapy: Secondary | ICD-10-CM | POA: Insufficient documentation

## 2023-04-09 DIAGNOSIS — Z853 Personal history of malignant neoplasm of breast: Secondary | ICD-10-CM | POA: Insufficient documentation

## 2023-04-09 DIAGNOSIS — E039 Hypothyroidism, unspecified: Secondary | ICD-10-CM | POA: Insufficient documentation

## 2023-04-09 DIAGNOSIS — I272 Pulmonary hypertension, unspecified: Secondary | ICD-10-CM

## 2023-04-09 LAB — BASIC METABOLIC PANEL
Anion gap: 13 (ref 5–15)
BUN: 15 mg/dL (ref 6–20)
CO2: 22 mmol/L (ref 22–32)
Calcium: 9.6 mg/dL (ref 8.9–10.3)
Chloride: 103 mmol/L (ref 98–111)
Creatinine, Ser: 0.7 mg/dL (ref 0.44–1.00)
GFR, Estimated: >60 mL/min (ref >60)
Glucose, Bld: 99 mg/dL (ref 70–99)
Potassium: 3.6 mmol/L (ref 3.5–5.1)
Sodium: 138 mmol/L (ref 135–145)

## 2023-04-09 LAB — BRAIN NATRIURETIC PEPTIDE: B Natriuretic Peptide: 8 pg/mL (ref 0.0–100.0)

## 2023-04-09 MED ORDER — AMBRISENTAN 10 MG PO TABS
10.0000 mg | ORAL_TABLET | Freq: Every day | ORAL | 11 refills | Status: DC
  Filled 2023-04-09: qty 30, 30d supply, fill #0
  Filled 2023-05-15 (×2): qty 30, 30d supply, fill #1
  Filled 2023-06-12: qty 30, 30d supply, fill #2
  Filled 2023-07-10: qty 30, 30d supply, fill #3
  Filled 2023-08-04: qty 30, 30d supply, fill #4
  Filled 2023-08-26 – 2023-09-03 (×2): qty 30, 30d supply, fill #5
  Filled 2023-09-30: qty 30, 30d supply, fill #6
  Filled 2023-10-21 (×3): qty 30, 30d supply, fill #7
  Filled 2023-11-30: qty 30, 30d supply, fill #8
  Filled 2023-12-30 – 2024-01-04 (×2): qty 30, 30d supply, fill #9
  Filled 2024-02-05: qty 30, 30d supply, fill #10
  Filled 2024-03-04: qty 30, 30d supply, fill #11

## 2023-04-09 NOTE — Telephone Encounter (Signed)
Received notification from [facility] that patient is currently tolerating 400 MCG BID. Plan to titrate to 600 MCG BID starting 04/06/2023.  Marga Melnick, RN, BSN [phone]) will be following up with the patient, next visit scheduled for 04/09/2023.

## 2023-04-09 NOTE — Patient Instructions (Signed)
INCREASE your Letaris to 10 mg daily.  Labs done today, your results will be available in MyChart, we will contact you for abnormal readings.  Your physician recommends that you schedule a follow-up appointment in: 2 months.  If you have any questions or concerns before your next appointment please send [place] a message through [place] or call our office at [phone].    TO LEAVE A MESSAGE FOR THE NURSE SELECT OPTION 2, PLEASE LEAVE A MESSAGE INCLUDING: YOUR NAME DATE OF BIRTH CALL BACK NUMBER REASON FOR CALL**this is important as we prioritize the call backs  YOU WILL RECEIVE A CALL BACK THE SAME DAY AS LONG AS YOU CALL BEFORE 4:00 PM  At the [facility], you and your health needs are our priority. As part of our continuing mission to provide you with exceptional heart care, we have created designated Provider Care Teams. These Care Teams include your primary Cardiologist (physician) and Advanced Practice Providers (APPs- Physician Assistants and Nurse Practitioners) who all work together to provide you with the care you need, when you need it.   You may see any of the following providers on your designated Care Team at your next follow up: Dr Arvilla Meres Dr Marca Ancona Dr. Dorthula Nettles Dr. Clearnce Hasten Amy Filbert Schilder, NP Robbie Lis, [place] [facility] [place], [place] Brynda Peon, NP [place] Lee, NP Karle Plumber, PharmD   Please be sure to bring in all your medications bottles to every appointment.    Thank you for choosing [facility]

## 2023-04-09 NOTE — Progress Notes (Signed)
Specialty Pharmacy Refill Coordination Note  Tonya Odonnell is a 59 y.o. female contacted today regarding refills of specialty medication(s) Ambrisentan [name]   Patient requested Delivery   Delivery date: 04/15/23   Verified address: [address] [place]   Medication will be filled on 02.04.25.   Dose increased to 10mg  daily  Auth # [id number] Exp 02.06.25

## 2023-04-10 ENCOUNTER — Other Ambulatory Visit (HOSPITAL_COMMUNITY): Payer: Self-pay

## 2023-04-10 NOTE — Progress Notes (Signed)
ADVANCED HEART FAILURE CLINIC NOTE  Primary Care: Marjie Skiff, NP HF Cardiology: Dr. Shirlee Latch  Chief Complaint: Pulmonary hypertension  HPI: Tonya Odonnell is a 58 y.o. female with pulmonary hypertension, hypertension, breast cancer status postmastectomy and chemotherapy in 2017 complicated by severe neuropathic/psychosomatic pain, hypothyroidism and fibromyalgia.  She was admitted 10/24 at [facility] with progressive  dyspnea as well as pain.  Prior to that she was seen by [facility] rheumatology and diagnosed with fibromyalgia.  2 months prior to her hospitalization she reports having progressive exertional dyspnea with shortness of breath just walking to her mailbox.  2 weeks prior to admission this progressed to chest tightness both at rest and with exertion.  During her hospitalization, echocardiogram was done showing EF of 50 to 55% with D-shaped interventricular septum.  She underwent right and left heart catheterization that was significant for pulmonary hypertension with a PVR of 7.3 Wood units.  CTA chest during this admission showed no PE, no parenchymal lung disease.  PFTs in 10/24 were surprisingly normal.  V/Q scan in 10/24 showed no evidence for chronic PE.  She was subsequently started on tadalafil 20 mg and PH evaluation was started.  Sleep study showed mild OSA, pulmonary did not recommend CPAP.   In 1/25, she developed appendicitis and had uncomplicated laparoscopic appendectomy at [facility].   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  She has been titrating up selexipag recently.  She has done well with this medication so far with minimal symptoms.  She has noted poor appetite and abdominal fullness post-appendectomy a couple of weeks ago, but this is improving.  She walks her dogs for 15-20 minutes/day, no dyspnea walking on flat ground.  Dyspnea with hills, stairs.  No chest pain.  No lightheadedness or syncope.  Weight is stable.  Not having much  fibromyalgia pain.   Labs (2/24): CCP negative, RF negative Labs (10/24): BNP 42, anti-SCL 70 negative, ANA negative, HIV negative Labs (11/24): K 4, creatinine 0.88 Labs (1/25): K 3.6, creatinine 0.86, LFTs normal, hgb 13.5  ECG (personally reviewed): sinus tachycardia otherwise normal.  6 minute walk (12/24): 457 m  PMH: 1. Fibromyalgia: Has seen rheumatology at [facility].  2. HTN 3. Hypothyroidism 4. Breast cancer: 2017, Treated with chemotherapy and mastectomy.  5. Pulmonary hypertension: Echo (10/24) with EF 50-55%, D-shaped septum suggestive of RV pressure/volume overload, RV poorly visualized but appeared dilated and dysfunctional.   - RHC/LHC (10/24): No significant CAD; mean RA 11, PA 76/38 mean 52, mean PCWP 10, PVR 7.3 WU, CI 3.04 - V/Q scan (10/24): No evidence for chronic PE.  - CTA chest (10/24): No acute PE, no evidence for lung parenchymal disease.  - PFTs (10/24): FVC 92%, FEV1 102%, TLC 101%, DLCO normal (surprising).   - Sleep study (10/24): mild OSA.  - CCP/RF negative. Anti-SCL70, HIV, and ANA negative.  6. Appendicitis: Appendectomy 1/25.   SH: Nonsmoker, no ETOH, married, originally from [place].   FH: No pulmonary hypertension or cardiomyopathy that she knows of.   ROS: All systems reviewed and negative except as per HPI.    Current Outpatient Medications  Medication Sig Dispense Refill   acetaminophen (TYLENOL) 500 MG tablet Take 2 tablets (1,000 mg total) by mouth every 6 (six) hours as needed.     ascorbic acid (VITAMIN C) 500 MG tablet Take 500 mg by mouth daily.     Azelaic Acid 15 % gel Apply to face every morning 50 g 5   clobetasol  cream (TEMOVATE) 0.05 % Apply 1 Application topically as needed.     cromolyn (OPTICROM) 4 % ophthalmic solution Place 1 drop into both eyes 4 (four) times daily. 10 mL 4   cyanocobalamin (VITAMIN B12) 1000 MCG tablet Take 1,000 mcg by mouth daily.     diltiazem 2 % GEL Apply 1 Application topically 3 (three) times daily.  Inside the rectum as directed. 30 g 0   doxycycline (ORACEA) 40 MG capsule Take 40 mg by mouth daily.     doxycycline (PERIOSTAT) 20 MG tablet TAKE 1 TABLET BY MOUTH TWICE A DAY 180 tablet 2   ELIDEL 1 % cream Apply to red scaly areas on the face BID for seb derm 30 g 4   fluticasone (FLONASE) 50 MCG/ACT nasal spray Place 2 sprays into both nostrils daily. 16 g 2   furosemide (LASIX) 40 MG tablet Take 1 tablet (40 mg total) by mouth daily. 30 tablet 3   hydrALAZINE (APRESOLINE) 25 MG tablet Take 1 tablet (25 mg total) by mouth 2 (two) times daily. 180 tablet 3   Ivermectin (SOOLANTRA) 1 % CREA Apply 1 Application topically at bedtime. 45 g 5   levothyroxine (SYNTHROID) 112 MCG tablet Take 1 tablet (112 mcg total) by mouth daily. 90 tablet 3   omeprazole (PRILOSEC) 40 MG capsule Take 40 mg by mouth as needed.     oxyCODONE (OXY IR/ROXICODONE) 5 MG immediate release tablet Take 1 tablet (5 mg total) by mouth every 6 (six) hours as needed. 12 tablet 0   potassium chloride SA (KLOR-CON M) 20 MEQ tablet Take 1 tablet (20 mEq total) by mouth daily. 30 tablet 3   Selexipag (UPTRAVI) 200 MCG TABS Take 600 mcg by mouth 2 (two) times daily.     tadalafil, PAH, (ADCIRCA) 20 MG tablet Take 2 tablets (40 mg total) by mouth daily. NDC's covered by plan: 40981191478, 29562130865, [id number]     valACYclovir (VALTREX) 1000 MG tablet Take 1,000 mg by mouth as needed.     ambrisentan (LETAIRIS) 10 MG tablet Take 1 tablet (10 mg total) by mouth daily. 30 tablet 11   Cholecalciferol 25 MCG (1000 UT) tablet Take 1 tablet (1,000 Units total) by mouth daily. (Patient not taking: Reported on 04/09/2023) 90 tablet 4   No current facility-administered medications for this encounter.    Allergies  Allergen Reactions   Lisinopril Other (See Comments)    cough cough     PHYSICAL EXAM: BP 118/80   Pulse (!) 108   Wt 83.1 kg (183 lb 3.2 oz)   LMP  (LMP Unknown)   SpO2 93%   BMI 32.45 kg/m  General: NAD Neck:  Thick. No JVD, no thyromegaly or thyroid nodule.  Lungs: Clear to auscultation bilaterally with normal respiratory effort. CV: Nondisplaced PMI.  Heart regular S1/S2, no S3/S4, no murmur.  No peripheral edema.  No carotid bruit.  Normal pedal pulses.  Abdomen: Soft, nontender, no hepatosplenomegaly, no distention.  Skin: Intact without lesions or rashes.  Neurologic: Alert and oriented x 3.  Psych: Normal affect. Extremities: No clubbing or cyanosis.  HEENT: Normal.   ASSESSMENT & PLAN:  1. Pulmonary hypertension/RV failure: Echo in 10/24 showed EF 50-55%; RV was poorly visualized but there was a D-shaped interventricular septum and concern for RV dilation/dysfunction.  RHC/LHC in 10/24 showed no CAD; mean RA 11, PA 76/38 mean 52, mean PWP 10, PVR 7.3 WU and CI 3.04.  PE CT 10/24 showed no acute PE and no parenchymal  lung disease.  V/Q scan 10/24 showed no chronic PE.  PFTs in 10/24 were normal (including, surprisingly, DLCO).  Rheumatology serologies and HIV were negative.  Sleep study showed mild OSA.  No FH of pulmonary hypertension.  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 She is not volume overloaded on exam, NYHA class II symptoms.  - Continue Lasix 40 mg daily.  BMET/BNP.    - Continue tadalafil 40 mg daily, she feels like this has helped.  - She is tolerating ambrisentan 5 mg with no problems.  I will increase to 10 mg daily.  Recent LFTs normal.   - Continue to titrate up selexipag as tolerated to goal of 1600 mcg bid then will reassess.  - Eventual sotatercept.  - Defer 6 minute walk today given recent appendectomy.  Will do at followup appt.   2. HTN: BP controlled, she is only taking hydralazine 25 mg bid.  3. Fibromyalgia: With diffuse pain though improved recently.  Evaluation has not found a specific rheumatologic diagnosis.   She will followup with me in 2 months.   I spent 31 minutes reviewing records, interviewing/examining patient,  and managing order   Marca Ancona 04/10/2023

## 2023-04-11 DIAGNOSIS — Z419 Encounter for procedure for purposes other than remedying health state, unspecified: Secondary | ICD-10-CM | POA: Diagnosis not present

## 2023-04-11 DIAGNOSIS — Z7689 Persons encountering health services in other specified circumstances: Secondary | ICD-10-CM | POA: Diagnosis not present

## 2023-04-14 ENCOUNTER — Other Ambulatory Visit: Payer: Self-pay

## 2023-04-14 ENCOUNTER — Other Ambulatory Visit (HOSPITAL_COMMUNITY): Payer: Self-pay

## 2023-04-14 NOTE — Progress Notes (Signed)
[employer] completed 04/14/23.   Transaction ID: [id number]  Shipped 04/14/23  Dispensed Authorization Code: [id number]  REMS form submitted successfully.

## 2023-04-19 DIAGNOSIS — Z7689 Persons encountering health services in other specified circumstances: Secondary | ICD-10-CM | POA: Diagnosis not present

## 2023-04-22 ENCOUNTER — Ambulatory Visit (INDEPENDENT_AMBULATORY_CARE_PROVIDER_SITE_OTHER): Payer: Medicaid Other | Admitting: Dermatology

## 2023-04-22 DIAGNOSIS — Z7189 Other specified counseling: Secondary | ICD-10-CM

## 2023-04-22 DIAGNOSIS — D1801 Hemangioma of skin and subcutaneous tissue: Secondary | ICD-10-CM | POA: Diagnosis not present

## 2023-04-22 DIAGNOSIS — L738 Other specified follicular disorders: Secondary | ICD-10-CM

## 2023-04-22 DIAGNOSIS — L719 Rosacea, unspecified: Secondary | ICD-10-CM | POA: Diagnosis not present

## 2023-04-22 NOTE — Progress Notes (Signed)
Follow-Up Visit   Subjective  Tonya Odonnell is a 58 y.o. female who presents for the following: Sebaceous Hyperplasia, face, pt presents for ED, Rosacea face, pt had to d/c Doxycycline due to causing her to ache all over The patient has spots, moles and lesions to be evaluated, some may be new or changing and the patient may have concern these could be cancer.   The following portions of the chart were reviewed this encounter and updated as appropriate: medications, allergies, medical history  Review of Systems:  No other skin or systemic complaints except as noted in HPI or Assessment and Plan.  Objective  Well appearing patient in no apparent distress; mood and affect are within normal limits.    A focused examination was performed of the following areas: face  Relevant exam findings are noted in the Assessment and Plan.  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  $60 for 1st lesion and $15 for each additional lesion if done on the same day.  Maximum charge $350.  One touch-up treatment included no charge. Discussed risks of treatment including dyspigmentation, small scar, and/or recurrence. Recommend daily broad spectrum sunscreen SPF 30+/photoprotection to treated areas once healed.   ED x 11 today to face $210.00 Forehead x 4 R nasal ala x 2 L cheek x 3 L jaw x 1 R upper lip x 1 Destruction of lesion - face x 11 (11)  Destruction method: electrodesiccation and curettage   Destruction method comment:  Electrodesiccation only, not curretage Informed consent: discussed and consent obtained   Timeout:  patient name, date of birth, surgical site, and procedure verified Patient was prepped and draped in usual sterile fashion: patient was prepped with isopropyl alcohol. Outcome: patient tolerated procedure  well with no complications   Additional details:  Ointment applied HEMANGIOMA OF SKIN R upper forehead x 1 Discussed cosmetic procedure ED to hemangioma, noncovered.  $60 for 1st lesion and $15 for each additional lesion if done on the same day.  Maximum charge $350.  One touch-up treatment included no charge. Discussed risks of treatment including dyspigmentation, small scar, and/or recurrence. Recommend daily broad spectrum sunscreen SPF 30+/photoprotection to treated areas once healed.   R forehead x 1   Destruction of lesion - R upper forehead x 1  Destruction method: electrodesiccation and curettage   Destruction method comment:  Electrodesiccation only not curretage Informed consent: discussed and consent obtained   Timeout:  patient name, date of birth, surgical site, and procedure verified Patient was prepped and draped in usual sterile fashion: patient was prepped with isopropyl alcohol. 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 Condition is symptomatic/ bothersome to patient. Not currently at goal.   Rosacea is a chronic progressive skin condition usually affecting the face of adults, causing redness and/or acne bumps. It is treatable but not curable. It sometimes affects the eyes (ocular rosacea) as well. It may respond to topical and/or systemic medication and can flare with stress, sun exposure, alcohol, exercise, topical steroids (including hydrocortisone/cortisone 10) and some foods.  Daily application of broad spectrum spf 30+ sunscreen to face is recommended to reduce flares.  Patient denies grittiness of the eyes  Treatment Plan Recommend BBL, multiple treatments would be needed for best result D/C Doxycycline due to it causing body aches  Counseling for BBL / IPL /  Laser and Coordination of Care Discussed the treatment option of Broad Band Light (BBL) /Intense Pulsed Light (IPL)/ Laser for skin discoloration, including brown spots and redness.  Typically we recommend at least 1-3 treatment sessions about 5-8 weeks apart for best results.  Cannot have tanned skin when BBL performed, and regular use of sunscreen/photoprotection is advised after the procedure to help maintain results. The patient's condition may also require "maintenance treatments" in the future.  The fee for BBL / laser treatments is $350 per treatment session for the whole face.  A fee can be quoted for other parts of the body.  Insurance typically does not pay for BBL/laser treatments and therefore the fee is an out-of-pocket cost. Recommend prophylactic valtrex treatment. Once scheduled for procedure, will send Rx in prior to patient's appointment.     Return if symptoms worsen or fail to improve.  I, Ardis Rowan, RMA, am acting as scribe for Willeen Niece, MD .   Documentation: I have reviewed the above documentation for accuracy and completeness, and I agree with the above.  Willeen Niece, MD

## 2023-04-22 NOTE — Patient Instructions (Addendum)
Counseling for BBL / IPL / Laser and Coordination of Care Discussed the treatment option of Broad Band Light (BBL) /Intense Pulsed Light (IPL)/ Laser for skin discoloration, including brown spots and redness.  Typically we recommend at least 1-3 treatment sessions about 5-8 weeks apart for best results.  Cannot have tanned skin when BBL performed, and regular use of sunscreen/photoprotection is advised after the procedure to help maintain results. The patient's condition may also require "maintenance treatments" in the future.  The fee for BBL / laser treatments is $350 per treatment session for the whole face.  A fee can be quoted for other parts of the body.  Insurance typically does not pay for BBL/laser treatments and therefore the fee is an out-of-pocket cost. Recommend prophylactic valtrex treatment. Once scheduled for procedure, will send Rx in prior to patient's appointment.     Due to recent changes in healthcare laws, you may see results of your pathology and/or laboratory studies on MyChart before the doctors have had a chance to review them. We understand that in some cases there may be results that are confusing or concerning to you. Please understand that not all results are received at the same time and often the doctors may need to interpret multiple results in order to provide you with the best plan of care or course of treatment. Therefore, we ask that you please give [place] 2 business days to thoroughly review all your results before contacting the office for clarification. Should we see a critical lab result, you will be contacted sooner.   If You Need Anything After Your Visit  If you have any questions or concerns for your doctor, please call our main line at [phone] and press option 4 to reach your doctor's medical assistant. If no one answers, please leave a voicemail as directed and we will return your call as soon as possible. Messages left after 4 pm will be answered the  following business day.   You may also send [place] a message via MyChart. We typically respond to MyChart messages within 1-2 business days.  For prescription refills, please ask your pharmacy to contact our office. Our fax number is [phone].  If you have an urgent issue when the clinic is closed that cannot wait until the next business day, you can page your doctor at the number below.    Please note that while we do our best to be available for urgent issues outside of office hours, we are not available 24/7.   If you have an urgent issue and are unable to reach [place], you may choose to seek medical care at your doctor's office, retail clinic, urgent care center, or emergency room.  If you have a medical emergency, please immediately call 911 or go to the emergency department.  Pager Numbers  - Dr. Gwen Pounds: [phone]  - Dr. Roseanne Reno: [phone]  - Dr. Katrinka Blazing: [phone]   In the event of inclement weather, please call our main line at [phone] for an update on the status of any delays or closures.  Dermatology Medication Tips: Please keep the boxes that topical medications come in in order to help keep track of the instructions about where and how to use these. Pharmacies typically print the medication instructions only on the boxes and not directly on the medication tubes.   If your medication is too expensive, please contact our office at [phone] option 4 or send [place] a message through MyChart.   We are unable to tell what  your co-pay for medications will be in advance as this is different depending on your insurance coverage. However, we may be able to find a substitute medication at lower cost or fill out paperwork to get insurance to cover a needed medication.   If a prior authorization is required to get your medication covered by your insurance company, please allow [place] 1-2 business days to complete this process.  Drug prices often vary depending on where the  prescription is filled and some pharmacies may offer cheaper prices.  The website www.goodrx.com contains coupons for medications through different pharmacies. The prices here do not account for what the cost may be with help from insurance (it may be cheaper with your insurance), but the website can give you the price if you did not use any insurance.  - You can print the associated coupon and take it with your prescription to the pharmacy.  - You may also stop by our office during regular business hours and pick up a GoodRx coupon card.  - If you need your prescription sent electronically to a different pharmacy, notify our office through [facility] or by phone at [phone] option 4.     Si Usted Necesita Algo Despus de Su Visita  Tambin puede enviarnos un mensaje a travs de Clinical cytogeneticist. Por lo general respondemos a los mensajes de MyChart en el transcurso de 1 a 2 das hbiles.  Para renovar recetas, por favor pida a su farmacia que se ponga en contacto con nuestra oficina. Annie Sable de fax es [place] [phone].  Si tiene un asunto urgente cuando la clnica est cerrada y que no puede esperar hasta el siguiente da hbil, puede llamar/localizar a su doctor(a) al nmero que aparece a continuacin.   Por favor, tenga en cuenta que aunque hacemos todo lo posible para estar disponibles para asuntos urgentes fuera del horario de [place], no estamos disponibles las 24 horas del da, los 7 [address]  [address] de la [place].   Si tiene un problema urgente y no puede comunicarse con nosotros, puede optar por buscar atencin mdica  en el consultorio de su doctor(a), en una clnica privada, en un centro de atencin urgente o en una sala de emergencias.  Si tiene Engineer, drilling, por favor llame inmediatamente al 911 o vaya a la sala de emergencias.  Nmeros de bper  - Dr. Gwen Pounds: [phone]  - Dra. Roseanne Reno: [phone]  - Dr. Katrinka Blazing: [phone]   En caso de inclemencias del  tiempo, por favor llame a Lacy Duverney principal al [phone] para una actualizacin sobre el [place] de cualquier retraso o cierre.  Consejos para la medicacin en dermatologa: Por favor, guarde las cajas en las que vienen los medicamentos de uso tpico para ayudarle a seguir las instrucciones sobre dnde y cmo usarlos. Las farmacias generalmente imprimen las instrucciones del medicamento slo en las cajas y no directamente en los tubos del [place].   Si su medicamento es muy caro, por favor, pngase en contacto con Rolm Gala llamando al [phone] y presione la opcin 4 o envenos un mensaje a travs de Clinical cytogeneticist.   No podemos decirle cul ser su copago por los medicamentos por adelantado ya que esto es diferente dependiendo de la cobertura de su seguro. Sin embargo, es posible que podamos encontrar un medicamento sustituto a Audiological scientist un formulario para que el seguro cubra el medicamento que se considera necesario.   Si se requiere una autorizacin previa para que su compaa de seguros [place] su  medicamento, por favor permtanos de 1 a 2 das hbiles para completar [address]  Los precios de los medicamentos varan con frecuencia dependiendo del Environmental consultant de dnde se surte la receta y alguna farmacias pueden ofrecer precios ms baratos.  El sitio web www.goodrx.com tiene cupones para medicamentos de Health and safety inspector. Los precios aqu no tienen en cuenta lo que podra costar con la ayuda del seguro (puede ser ms barato con su seguro), pero el sitio web puede darle el precio si no utiliz Tourist information centre manager.  - Puede imprimir el cupn correspondiente y llevarlo con su receta a la farmacia.  - Tambin puede pasar por nuestra oficina durante el horario de atencin regular y Education officer, museum una tarjeta de cupones de GoodRx.  - Si necesita que su receta se enve electrnicamente a una farmacia diferente, informe a nuestra oficina a travs de MyChart de [place] o por telfono  llamando al [phone] y presione la opcin 4.

## 2023-04-24 ENCOUNTER — Other Ambulatory Visit: Payer: Self-pay

## 2023-04-24 DIAGNOSIS — Z7689 Persons encountering health services in other specified circumstances: Secondary | ICD-10-CM | POA: Diagnosis not present

## 2023-04-26 DIAGNOSIS — Z7689 Persons encountering health services in other specified circumstances: Secondary | ICD-10-CM | POA: Diagnosis not present

## 2023-05-01 ENCOUNTER — Other Ambulatory Visit: Payer: Medicaid Other

## 2023-05-05 DIAGNOSIS — Z7689 Persons encountering health services in other specified circumstances: Secondary | ICD-10-CM | POA: Diagnosis not present

## 2023-05-07 ENCOUNTER — Ambulatory Visit: Payer: Medicaid Other | Admitting: "Endocrinology

## 2023-05-09 DIAGNOSIS — Z419 Encounter for procedure for purposes other than remedying health state, unspecified: Secondary | ICD-10-CM | POA: Diagnosis not present

## 2023-05-12 ENCOUNTER — Other Ambulatory Visit (HOSPITAL_COMMUNITY): Payer: Self-pay

## 2023-05-13 ENCOUNTER — Other Ambulatory Visit (HOSPITAL_COMMUNITY): Payer: Self-pay

## 2023-05-13 ENCOUNTER — Other Ambulatory Visit: Payer: Self-pay

## 2023-05-15 ENCOUNTER — Other Ambulatory Visit: Payer: Self-pay

## 2023-05-15 ENCOUNTER — Other Ambulatory Visit: Payer: Self-pay | Admitting: Pharmacy Technician

## 2023-05-15 ENCOUNTER — Encounter (HOSPITAL_COMMUNITY): Payer: Self-pay

## 2023-05-15 ENCOUNTER — Other Ambulatory Visit (HOSPITAL_COMMUNITY): Payer: Self-pay

## 2023-05-15 ENCOUNTER — Other Ambulatory Visit: Payer: Self-pay | Admitting: Nurse Practitioner

## 2023-05-15 MED ORDER — CLOBETASOL PROPIONATE 0.05 % EX CREA
1.0000 | TOPICAL_CREAM | CUTANEOUS | 4 refills | Status: DC | PRN
  Filled 2023-05-15: qty 30, 30d supply, fill #0

## 2023-05-15 NOTE — Progress Notes (Signed)
 Specialty Pharmacy Refill Coordination Note  Libi Corso is a 58 y.o. female contacted today regarding refills of specialty medication(s) Ambrisentan [name]   Patient requested Delivery   Delivery date: 05/20/23   Verified address: [address] [place]   Medication will be filled on 05/19/23.  MD [facility] [id number] [id number] AUTH # [id number] EXP 05/22/2023

## 2023-05-16 ENCOUNTER — Other Ambulatory Visit (HOSPITAL_COMMUNITY): Payer: Self-pay

## 2023-05-16 DIAGNOSIS — Z7689 Persons encountering health services in other specified circumstances: Secondary | ICD-10-CM | POA: Diagnosis not present

## 2023-05-18 ENCOUNTER — Other Ambulatory Visit (HOSPITAL_COMMUNITY): Payer: Self-pay

## 2023-05-18 ENCOUNTER — Encounter: Payer: Self-pay | Admitting: Nurse Practitioner

## 2023-05-19 ENCOUNTER — Other Ambulatory Visit (HOSPITAL_COMMUNITY): Payer: Self-pay

## 2023-05-19 ENCOUNTER — Other Ambulatory Visit: Payer: Self-pay

## 2023-05-19 MED ORDER — CLOBETASOL PROPIONATE 0.05 % EX CREA: 1.0000 | TOPICAL_CREAM | CUTANEOUS | 4 refills | Status: AC | PRN

## 2023-05-19 NOTE — Progress Notes (Signed)
 Loopback complete:  Transaction ID# [id number]  Dispense Authorization Code: [id number]  Ship date: 05/19/23  Dispense Auth Expiration: 05/22/23

## 2023-05-20 ENCOUNTER — Other Ambulatory Visit (HOSPITAL_COMMUNITY): Payer: Self-pay | Admitting: Cardiology

## 2023-05-20 ENCOUNTER — Other Ambulatory Visit (HOSPITAL_COMMUNITY): Payer: Self-pay

## 2023-05-20 ENCOUNTER — Telehealth: Payer: Self-pay

## 2023-05-20 MED ORDER — FUROSEMIDE 40 MG PO TABS: 40.0000 mg | ORAL_TABLET | Freq: Every day | ORAL | 1 refills | Status: AC

## 2023-05-20 MED ORDER — HYDRALAZINE HCL 25 MG PO TABS: 25.0000 mg | ORAL_TABLET | Freq: Two times a day (BID) | ORAL | 1 refills | Status: DC

## 2023-05-20 MED ORDER — POTASSIUM CHLORIDE CRYS ER 20 MEQ PO TBCR: 20.0000 meq | EXTENDED_RELEASE_TABLET | Freq: Every day | ORAL | 1 refills | Status: AC

## 2023-05-20 NOTE — Telephone Encounter (Signed)
 Left pt msg to call about upcoming BBL appointment.  Left msg advising that we need to discuss prophylactic Valtrex for her BBL appointment./sh

## 2023-05-21 DIAGNOSIS — Z7689 Persons encountering health services in other specified circumstances: Secondary | ICD-10-CM | POA: Diagnosis not present

## 2023-05-22 DIAGNOSIS — Z7689 Persons encountering health services in other specified circumstances: Secondary | ICD-10-CM | POA: Diagnosis not present

## 2023-05-23 DIAGNOSIS — Z7689 Persons encountering health services in other specified circumstances: Secondary | ICD-10-CM | POA: Diagnosis not present

## 2023-05-26 DIAGNOSIS — Z7689 Persons encountering health services in other specified circumstances: Secondary | ICD-10-CM | POA: Diagnosis not present

## 2023-05-27 ENCOUNTER — Other Ambulatory Visit (HOSPITAL_COMMUNITY): Payer: Self-pay | Admitting: Pharmacist

## 2023-05-27 ENCOUNTER — Ambulatory Visit: Payer: Medicaid Other | Admitting: Dermatology

## 2023-05-27 MED ORDER — UPTRAVI 600 MCG PO TABS
600.0000 ug | ORAL_TABLET | Freq: Two times a day (BID) | ORAL | 11 refills | Status: DC
Start: 2023-05-27 — End: 2023-06-05

## 2023-05-28 ENCOUNTER — Ambulatory Visit: Admitting: Dermatology

## 2023-05-28 DIAGNOSIS — E876 Hypokalemia: Secondary | ICD-10-CM | POA: Diagnosis not present

## 2023-05-28 DIAGNOSIS — I130 Hypertensive heart and chronic kidney disease with heart failure and stage 1 through stage 4 chronic kidney disease, or unspecified chronic kidney disease: Secondary | ICD-10-CM | POA: Diagnosis not present

## 2023-05-28 DIAGNOSIS — J309 Allergic rhinitis, unspecified: Secondary | ICD-10-CM | POA: Diagnosis not present

## 2023-05-28 DIAGNOSIS — Z8249 Family history of ischemic heart disease and other diseases of the circulatory system: Secondary | ICD-10-CM | POA: Diagnosis not present

## 2023-05-28 DIAGNOSIS — Z6832 Body mass index (BMI) 32.0-32.9, adult: Secondary | ICD-10-CM | POA: Diagnosis not present

## 2023-05-28 DIAGNOSIS — K5900 Constipation, unspecified: Secondary | ICD-10-CM | POA: Diagnosis not present

## 2023-05-28 DIAGNOSIS — I509 Heart failure, unspecified: Secondary | ICD-10-CM | POA: Diagnosis not present

## 2023-05-28 DIAGNOSIS — E039 Hypothyroidism, unspecified: Secondary | ICD-10-CM | POA: Diagnosis not present

## 2023-05-28 DIAGNOSIS — Z809 Family history of malignant neoplasm, unspecified: Secondary | ICD-10-CM | POA: Diagnosis not present

## 2023-05-28 DIAGNOSIS — N182 Chronic kidney disease, stage 2 (mild): Secondary | ICD-10-CM | POA: Diagnosis not present

## 2023-05-28 DIAGNOSIS — I272 Pulmonary hypertension, unspecified: Secondary | ICD-10-CM | POA: Diagnosis not present

## 2023-06-01 ENCOUNTER — Other Ambulatory Visit: Payer: Medicaid Other

## 2023-06-01 ENCOUNTER — Other Ambulatory Visit: Payer: Self-pay

## 2023-06-01 DIAGNOSIS — E063 Autoimmune thyroiditis: Secondary | ICD-10-CM | POA: Diagnosis not present

## 2023-06-01 DIAGNOSIS — Z7689 Persons encountering health services in other specified circumstances: Secondary | ICD-10-CM | POA: Diagnosis not present

## 2023-06-02 LAB — T4, FREE: Free T4: 1.9 ng/dL — ABNORMAL HIGH (ref 0.8–1.8)

## 2023-06-02 LAB — TSH: TSH: 2.99 m[IU]/L (ref 0.40–4.50)

## 2023-06-04 ENCOUNTER — Telehealth (HOSPITAL_COMMUNITY): Payer: Self-pay | Admitting: Cardiology

## 2023-06-04 ENCOUNTER — Ambulatory Visit: Payer: Medicaid Other | Admitting: "Endocrinology

## 2023-06-04 ENCOUNTER — Encounter: Payer: Self-pay | Admitting: "Endocrinology

## 2023-06-04 VITALS — BP 104/80 | HR 108 | Ht 63.0 in | Wt 183.0 lb

## 2023-06-04 DIAGNOSIS — E063 Autoimmune thyroiditis: Secondary | ICD-10-CM

## 2023-06-04 DIAGNOSIS — Z7689 Persons encountering health services in other specified circumstances: Secondary | ICD-10-CM | POA: Diagnosis not present

## 2023-06-04 MED ORDER — LEVOTHYROXINE SODIUM 112 MCG PO TABS: ORAL_TABLET | ORAL | 1 refills | Status: DC

## 2023-06-04 NOTE — Telephone Encounter (Signed)
 Called to confirm/remind patient of their appointment [facility] on 06/04/[facility]23.   Appointment:   [x] Confirmed  [] Left mess   [] No answer/No voice mail  [] Phone not in service  Patient reminded to bring all medications and/or complete list.  Confirmed patient has transportation. Gave directions, instructed to utilize valet parking.

## 2023-06-04 NOTE — Progress Notes (Signed)
 Outpatient Endocrinology Note Tonya [place], MD  06/04/23   Tonya Odonnell 04-18-1965 [id number]  Referring Provider: Marjie Skiff, NP Primary Care Provider: Marjie Skiff, NP Subjective  No chief complaint on file.   Assessment & Plan  Diagnoses and all orders for this visit:  Hashimoto's thyroiditis -     TSH -     T3, free -     T4, free  Other orders -     levothyroxine (SYNTHROID) 112 MCG tablet; Take 1 pill everyday except Sunday take half a pill   Tonya Odonnell is currently taking levothyroxine 112 mcg every day. Patient is currently biochemically hyperthyroid.  Educated on thyroid axis.  Recommend the following: Take levothyroxine 112 mcg every day except Sundays take half a pill. Advised to take levothyroxine first thing in the morning on empty stomach and wait at least 30 minutes to 1 hour before eating or drinking anything or taking any other medications. Space out levothyroxine by 4 hours from any acid reflux medication/fibrate/iron/calcium/multivitamin. Advised to take nutritional supplements in the evening. Repeat lab before next visit or sooner if symptoms of hyperthyroidism or hypothyroidism develop.  Notify us immediately in case of significant weight gain or loss. Counseled on compliance and follow up needs.  I have reviewed current medications, nurse's notes, allergies, vital signs, past medical and surgical history, family medical history, and social history for this encounter. Counseled patient on symptoms, examination findings, lab findings, imaging results, treatment decisions and monitoring and prognosis. The patient understood the recommendations and agrees with the treatment plan. All questions regarding treatment plan were fully answered.   Return in about 3 months (around 09/04/2023) for visit + labs before next visit.   Tonya [place], MD  06/04/23   I have reviewed current medications, nurse's notes, allergies, vital signs, past  medical and surgical history, family medical history, and social history for this encounter. Counseled patient on symptoms, examination findings, lab findings, imaging results, treatment decisions and monitoring and prognosis. The patient understood the recommendations and agrees with the treatment plan. All questions regarding treatment plan were fully answered.   History of Present Illness Tonya Odonnell is a 58 y.o. year old female who presents to our clinic with hypothyroidism diagnosed in 30s.  On levothyroxine 112 mcg every day   Symptoms suggestive of HYPOTHYROIDISM:  fatigue Yes weight gain No cold intolerance  No constipation  No  Symptoms suggestive of HYPERTHYROIDISM:  weight loss  No heat intolerance Yes hyperdefecation  No palpitations  Yes  Compressive symptoms:  dysphagia  No dysphonia  No positional dyspnea (especially with simultaneous arms elevation)  No  Smokes  No On biotin  No Personal history of head/neck surgery/irradiation  No  Physical Exam  BP 104/80   Pulse (!) 108   Ht 5\' 3"  (1.6 m)   Wt 183 lb (83 kg)   LMP  (LMP Unknown)   SpO2 95%   BMI 32.42 kg/m  Constitutional: well developed, well nourished Head: normocephalic, atraumatic, no exophthalmos Eyes: sclera anicteric, no redness Neck: no thyromegaly, no thyroid tenderness; no nodules palpated Lungs: normal respiratory effort Neurology: alert and oriented, no fine hand tremor Skin: dry, no appreciable rashes Musculoskeletal: no appreciable defects Psychiatric: normal mood and affect  Allergies Allergies  Allergen Reactions   Lisinopril Other (See Comments)    cough cough     Current Medications Patient's Medications  New Prescriptions   No medications on file  Previous Medications   ACETAMINOPHEN (TYLENOL) 500  MG TABLET    Take 2 tablets (1,000 mg total) by mouth every 6 (six) hours as needed.   AMBRISENTAN (LETAIRIS) 10 MG TABLET    Take 1 tablet (10 mg total) by mouth daily.    ASCORBIC ACID (VITAMIN C) 500 MG TABLET    Take 500 mg by mouth daily.   AZELAIC ACID 15 % GEL    Apply to face every morning   CHOLECALCIFEROL 25 MCG (1000 UT) TABLET    Take 1 tablet (1,000 Units total) by mouth daily.   CLOBETASOL CREAM (TEMOVATE) 0.05 %    Apply 1 Application topically as needed.   CROMOLYN (OPTICROM) 4 % OPHTHALMIC SOLUTION    Place 1 drop into both eyes 4 (four) times daily.   CYANOCOBALAMIN (VITAMIN B12) 1000 MCG TABLET    Take 1,000 mcg by mouth daily.   DILTIAZEM 2 % GEL    Apply 1 Application topically 3 (three) times daily. Inside the rectum as directed.   DOXYCYCLINE (ORACEA) 40 MG CAPSULE    Take 40 mg by mouth daily.   DOXYCYCLINE (PERIOSTAT) 20 MG TABLET    TAKE 1 TABLET BY MOUTH TWICE A DAY   ELIDEL 1 % CREAM    Apply to red scaly areas on the face BID for seb derm   FLUTICASONE (FLONASE) 50 MCG/ACT NASAL SPRAY    Place 2 sprays into both nostrils daily.   FUROSEMIDE (LASIX) 40 MG TABLET    Take 1 tablet (40 mg total) by mouth daily.   HYDRALAZINE (APRESOLINE) 25 MG TABLET    Take 1 tablet (25 mg total) by mouth 2 (two) times daily.   IVERMECTIN (SOOLANTRA) 1 % CREA    Apply 1 Application topically at bedtime.   OMEPRAZOLE (PRILOSEC) 40 MG CAPSULE    Take 40 mg by mouth as needed.   OXYCODONE (OXY IR/ROXICODONE) 5 MG IMMEDIATE RELEASE TABLET    Take 1 tablet (5 mg total) by mouth every 6 (six) hours as needed.   POTASSIUM CHLORIDE SA (KLOR-CON M) 20 MEQ TABLET    Take 1 tablet (20 mEq total) by mouth daily.   SELEXIPAG (UPTRAVI) 600 MCG TABS    Take 600 mcg by mouth 2 (two) times daily.   TADALAFIL, PAH, (ADCIRCA) 20 MG TABLET    Take 2 tablets (40 mg total) by mouth daily. NDC's covered by plan: 16109604540, 98119147829, [id number]   VALACYCLOVIR (VALTREX) 1000 MG TABLET    Take 1,000 mg by mouth as needed.  Modified Medications   Modified Medication Previous Medication   LEVOTHYROXINE (SYNTHROID) 112 MCG TABLET levothyroxine (SYNTHROID) 112 MCG tablet       Take 1 pill everyday except Sunday take half a pill    Take 1 tablet (112 mcg total) by mouth daily.  Discontinued Medications   No medications on file    Past Medical History Past Medical History:  Diagnosis Date   Anemia    "USE TO HAVE IT,NOT NOW,2015,2016   Arthritis    HIPS,KNEES,HANDS   Breast cancer in female (HCC)    Cancer (HCC)    LEFT   Hyperlipidemia    Hypertension    Low iron    Neuromuscular disorder (HCC)    MUSCLE PAIN,FIBROMYALGIA   Osteopenia    Pulmonary HTN (HCC)    PVC (premature ventricular contraction)    Thyroid disease     Past Surgical History Past Surgical History:  Procedure Laterality Date   LAPAROSCOPIC APPENDECTOMY N/A 03/27/2023   Procedure: APPENDECTOMY LAPAROSCOPIC;  Surgeon: Quentin Ore, MD;  Location: [facility] OR;  Service: General;  Laterality: N/A;   Left Ovarian removal Left    MASTECTOMY Left    "LYMPHNODES REMOVED   RIGHT/LEFT HEART CATH AND CORONARY ANGIOGRAPHY N/A 12/12/2022   Procedure: RIGHT/LEFT HEART CATH AND CORONARY ANGIOGRAPHY;  Surgeon: Iran Ouch, MD;  Location: [facility] INVASIVE CV LAB;  Service: Cardiovascular;  Laterality: N/A;    Family History family history includes Hypertension in her mother; Leukemia in her mother; Stomach cancer in her father.  Social History Social History   Socioeconomic History   Marital status: Married    Spouse name: Not on file   Number of children: Not on file   Years of education: Not on file   Highest education level: Not on file  Occupational History   Occupation: disabled  Tobacco Use   Smoking status: Never   Smokeless tobacco: Never  Vaping Use   Vaping status: Never Used  Substance and Sexual Activity   Alcohol use: Never   Drug use: Never   Sexual activity: Not Currently  Other Topics Concern   Not on file  Social History Narrative   Not on file   Social Drivers of Health   Financial Resource Strain: High Risk (05/13/2021)   Received from [facility],  [facility]   Overall Financial Resource Strain (CARDIA)    Difficulty of Paying Living Expenses: Very hard  Food Insecurity: No Food Insecurity (03/27/2023)   Hunger Vital Sign    Worried About Running Out of Food in the Last Year: Never true    Ran Out of Food in the Last Year: Never true  Transportation Needs: No Transportation Needs (03/27/2023)   PRAPARE - Administrator, Civil Service (Medical): No    Lack of Transportation (Non-Medical): No  Physical Activity: Inactive (05/13/2021)   Received from [facility], [facility]   Exercise Vital Sign    Days of Exercise per Week: 0 days    Minutes of Exercise per Session: 0 min  Stress: Stress Concern Present (05/13/2021)   Received from [facility] - Occupational Stress Questionnaire    Feeling of Stress : Very much  Social Connections: Unknown (07/13/2021)   Received from [facility], [facility]   Social Network    Social Network: Not on file  Intimate Partner Violence: Not At Risk (03/27/2023)   Humiliation, Afraid, Rape, and Kick questionnaire    Fear of Current or Ex-Partner: No    Emotionally Abused: No    Physically Abused: No    Sexually Abused: No    Laboratory Investigations Lab Results  Component Value Date   TSH 2.99 06/01/2023   TSH 2.667 12/31/2022   TSH 5.570 (H) 12/22/2022   FREET4 1.9 (H) 06/01/2023   FREET4 1.72 (H) 12/31/2022   FREET4 2.10 (H) 12/22/2022     No results found for: "TSI"   No components found for: "TRAB"   Lab Results  Component Value Date   CHOL 226 (H) 12/11/2022   Lab Results  Component Value Date   HDL 65 12/11/2022   Lab Results  Component Value Date   LDLCALC 145 (H) 12/11/2022   Lab Results  Component Value Date   TRIG 78 12/11/2022   Lab Results  Component Value Date   CHOLHDL 3.5 12/11/2022   Lab Results  Component Value Date   CREATININE 0.70 04/09/2023   No results found for: "GFR"  Component Value Date/Time   NA 138 04/09/2023 1250   NA 141 01/20/2023 1434   K 3.6 04/09/2023 1250   CL 103 04/09/2023 1250   CO2 22 04/09/2023 1250   GLUCOSE 99 04/09/2023 1250   BUN 15 04/09/2023 1250   BUN 15 01/20/2023 1434   CREATININE 0.70 04/09/2023 1250   CREATININE 0.77 09/17/2022 0801   CALCIUM 9.6 04/09/2023 1250   PROT 6.3 (L) 03/28/2023 1209   PROT 6.7 07/08/2022 1106   ALBUMIN 3.3 (L) 03/28/2023 1209   ALBUMIN 4.4 07/08/2022 1106   AST 26 03/28/2023 1209   AST 16 09/17/2022 0801   ALT 26 03/28/2023 1209   ALT 26 09/17/2022 0801   ALKPHOS 59 03/28/2023 1209   BILITOT 1.0 03/28/2023 1209   BILITOT 1.0 09/17/2022 0801   GFRNONAA >60 04/09/2023 1250   GFRNONAA >60 09/17/2022 0801      Latest Ref Rng & Units 04/09/2023   12:50 PM 03/28/2023    6:46 AM 03/27/2023   12:03 AM  BMP  Glucose 70 - 99 mg/dL 99  161  096   BUN 6 - 20 mg/dL 15  15  21    Creatinine 0.44 - 1.00 mg/dL 0.45  4.09  8.11   Sodium 135 - 145 mmol/L 138  139  135   Potassium 3.5 - 5.1 mmol/L 3.6  3.6  3.5   Chloride 98 - 111 mmol/L 103  106  102   CO2 22 - 32 mmol/L 22  22  21    Calcium 8.9 - 10.3 mg/dL 9.6  9.0  9.2        Component Value Date/Time   WBC 9.8 03/28/2023 0646   RBC 4.65 03/28/2023 0646   HGB 13.5 03/28/2023 0646   HGB 17.7 (H) 12/22/2022 1212   HCT 40.1 03/28/2023 0646   HCT 53.6 (H) 12/22/2022 1212   PLT 294 03/28/2023 0646   PLT 379 12/22/2022 1212   MCV 86.2 03/28/2023 0646   MCV 89 12/22/2022 1212   MCH 29.0 03/28/2023 0646   MCHC 33.7 03/28/2023 0646   RDW 13.7 03/28/2023 0646   RDW 12.9 12/22/2022 1212   LYMPHSABS 2.2 12/22/2022 1212   MONOABS 0.8 09/17/2022 0801   EOSABS 0.1 12/22/2022 1212   BASOSABS 0.1 12/22/2022 1212      Parts of this note may have been dictated using voice recognition software. There may be variances in spelling and vocabulary which are unintentional. Not all errors are proofread. Please notify the Thereasa Parkin if any discrepancies are  noted or if the meaning of any statement is not clear.

## 2023-06-05 ENCOUNTER — Other Ambulatory Visit (HOSPITAL_COMMUNITY): Payer: Self-pay

## 2023-06-05 ENCOUNTER — Ambulatory Visit (HOSPITAL_COMMUNITY)
Admission: RE | Admit: 2023-06-05 | Discharge: 2023-06-05 | Disposition: A | Discharge status: Home / Self Care | Payer: Medicaid Other | Source: Ambulatory Visit | Attending: Cardiology | Admitting: Cardiology

## 2023-06-05 ENCOUNTER — Encounter (HOSPITAL_COMMUNITY): Payer: Self-pay | Admitting: Cardiology

## 2023-06-05 VITALS — BP 128/80 | HR 107 | Wt 183.8 lb

## 2023-06-05 DIAGNOSIS — M797 Fibromyalgia: Secondary | ICD-10-CM | POA: Diagnosis not present

## 2023-06-05 DIAGNOSIS — Z79899 Other long term (current) drug therapy: Secondary | ICD-10-CM | POA: Diagnosis not present

## 2023-06-05 DIAGNOSIS — I272 Pulmonary hypertension, unspecified: Secondary | ICD-10-CM | POA: Insufficient documentation

## 2023-06-05 DIAGNOSIS — Z7689 Persons encountering health services in other specified circumstances: Secondary | ICD-10-CM | POA: Diagnosis not present

## 2023-06-05 DIAGNOSIS — I10 Essential (primary) hypertension: Secondary | ICD-10-CM | POA: Diagnosis not present

## 2023-06-05 DIAGNOSIS — E039 Hypothyroidism, unspecified: Secondary | ICD-10-CM | POA: Insufficient documentation

## 2023-06-05 DIAGNOSIS — Z9221 Personal history of antineoplastic chemotherapy: Secondary | ICD-10-CM | POA: Insufficient documentation

## 2023-06-05 DIAGNOSIS — G4733 Obstructive sleep apnea (adult) (pediatric): Secondary | ICD-10-CM | POA: Diagnosis not present

## 2023-06-05 DIAGNOSIS — Z853 Personal history of malignant neoplasm of breast: Secondary | ICD-10-CM | POA: Insufficient documentation

## 2023-06-05 LAB — BASIC METABOLIC PANEL WITH GFR
Anion gap: 11 (ref 5–15)
BUN: 20 mg/dL (ref 6–20)
CO2: 25 mmol/L (ref 22–32)
Calcium: 9.7 mg/dL (ref 8.9–10.3)
Chloride: 106 mmol/L (ref 98–111)
Creatinine, Ser: 0.74 mg/dL (ref 0.44–1.00)
GFR, Estimated: >60 mL/min (ref >60)
Glucose, Bld: 89 mg/dL (ref 70–99)
Potassium: 3.5 mmol/L (ref 3.5–5.1)
Sodium: 142 mmol/L (ref 135–145)

## 2023-06-05 LAB — BRAIN NATRIURETIC PEPTIDE: B Natriuretic Peptide: 11.6 pg/mL (ref 0.0–100.0)

## 2023-06-05 MED ORDER — SELEXIPAG 800 MCG PO TABS: 800.0000 ug | ORAL_TABLET | Freq: Two times a day (BID) | ORAL | 11 refills | Status: DC

## 2023-06-05 NOTE — Patient Instructions (Addendum)
 INCREASE Selexipag to 800 mcg Twice daily  Labs done today, your results will be available in MyChart, we will contact you for abnormal readings.  Your physician has requested that you have an echocardiogram. Echocardiography is a painless test that uses sound waves to create images of your heart. It provides your doctor with information about the size and shape of your heart and how well your heart's chambers and valves are working. This procedure takes approximately one hour. There are no restrictions for this procedure. Please do NOT wear cologne, perfume, aftershave, or lotions (deodorant is allowed). Please arrive 15 minutes prior to your appointment time.  Please note: We ask at that you not bring children with you during ultrasound (echo/ vascular) testing. Due to room size and safety concerns, children are not allowed in the ultrasound rooms during exams. Our front office staff cannot provide observation of children in our lobby area while testing is being conducted. An adult accompanying a patient to their appointment will only be allowed in the ultrasound room at the discretion of the ultrasound technician under special circumstances. We apologize for any inconvenience.  You are scheduled for a Cardiac Catheterization on Thursday, April 24 with Dr. Marca Ancona.  1. Please arrive at the [facility] (Main Entrance A) at [facility]: [address] [place], [place] [address] at 5:30 AM (This time is 2 hour(s) before your procedure to ensure your preparation).   Free valet parking service is available. You will check in at ADMITTING. The support person will be asked to wait in the waiting room.  It is OK to have someone drop you off and come back when you are ready to be discharged.    Special note: Every effort is made to have your procedure done on time. Please understand that emergencies sometimes delay scheduled procedures.  2. Diet: Do not eat solid foods after midnight.  The  patient may have clear liquids until 5am upon the day of the procedure.  3. Medication instructions in preparation for your procedure:   Contrast Allergy: No  HOLD YOUR LASIX THE MORNING OF YOUR PROCEDURE.  On the morning of your procedure, take any morning medicines NOT listed above.  You may use sips of water.  5. Plan to go home the same day, you will only stay overnight if medically necessary. 6. Bring a current list of your medications and current insurance cards. 7. You MUST have a responsible person to drive you home. 8. Someone MUST be with you the first 24 hours after you arrive home or your discharge will be delayed. 9. Please wear clothes that are easy to get on and off and wear slip-on shoes.  You have been referred to pulmonary rehab at [facility]. You will be called to have this test arranged.  Your physician recommends that you schedule a follow-up appointment in: 2 MONTHS.  If you have any questions or concerns before your next appointment please send [place] a message through [place] or call our office at [phone].    TO LEAVE A MESSAGE FOR THE NURSE SELECT OPTION 2, PLEASE LEAVE A MESSAGE INCLUDING: YOUR NAME DATE OF BIRTH CALL BACK NUMBER REASON FOR CALL**this is important as we prioritize the call backs  YOU WILL RECEIVE A CALL BACK THE SAME DAY AS LONG AS YOU CALL BEFORE 4:00 PM  At the [facility], you and your health needs are our priority. As part of our continuing mission to provide you with exceptional  heart care, we have created designated Provider Care Teams. These Care Teams include your primary Cardiologist (physician) and Advanced Practice Providers (APPs- Physician Assistants and Nurse Practitioners) who all work together to provide you with the care you need, when you need it.   You may see any of the following providers on your designated Care Team at your next follow up: Dr Arvilla Meres Dr Marca Ancona Dr. Dorthula Nettles Dr. Clearnce Hasten Amy Filbert Schilder, NP Robbie Lis, [place] [facility] [place], [place] Brynda Peon, NP [place] Lee, NP Clarisa Kindred, NP Karle Plumber, PharmD Enos Fling, PharmD   Please be sure to bring in all your medications bottles to every appointment.    Thank you for choosing [facility]

## 2023-06-05 NOTE — Progress Notes (Signed)
 6 Min Walk Test Completed  Pt ambulated 1600 ft (487.7 m) O2 Sat ranged 95%-91%  on room air HR ranged 103-107

## 2023-06-06 LAB — CENTROMERE ANTIBODIES: Centromere Ab Screen: <0.2 AI (ref 0.0–0.9)

## 2023-06-07 NOTE — Progress Notes (Signed)
 ADVANCED HEART FAILURE CLINIC NOTE  Primary Care: Marjie Skiff, NP HF Cardiology: Dr. Shirlee Latch  Chief Complaint: Pulmonary hypertension  HPI: Tonya Odonnell is a 58 y.o. female with pulmonary hypertension, hypertension, breast cancer status postmastectomy and chemotherapy in 2017 complicated by severe neuropathic/psychosomatic pain, hypothyroidism and fibromyalgia.  She was admitted 10/24 at [facility] with progressive  dyspnea as well as pain.  Prior to that she was seen by [facility] rheumatology and diagnosed with fibromyalgia.  2 months prior to her hospitalization she reports having progressive exertional dyspnea with shortness of breath just walking to her mailbox.  2 weeks prior to admission this progressed to chest tightness both at rest and with exertion.  During her hospitalization, echocardiogram was done showing EF of 50 to 55% with D-shaped interventricular septum.  She underwent right and left heart catheterization that was significant for pulmonary hypertension with a PVR of 7.3 Wood units.  CTA chest during this admission showed no PE, no parenchymal lung disease.  PFTs in 10/24 were surprisingly normal.  V/Q scan in 10/24 showed no evidence for chronic PE.  She was subsequently started on tadalafil 20 mg and PH evaluation was started.  Sleep study showed mild OSA, pulmonary did not recommend CPAP.   In 1/25, she developed appendicitis and had uncomplicated laparoscopic appendectomy at [facility].   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  She gets jaw pain/diarrhea with increases in selexipag but symptoms will resolve eventually after each increase. She walks her dogs generally without dyspnea.  She is short of breath walking around large stores.  She is short of breath when the weather changes.  Generally weak and tired.  Occasional atypical chest pain.  Weight is stable.   Labs (2/24): CCP negative, RF negative Labs (10/24): BNP 42, anti-SCL 70 negative,  ANA negative, HIV negative Labs (11/24): K 4, creatinine 0.88 Labs (1/25): K 3.6, creatinine 0.86, LFTs normal, hgb 13.5, BNP 8 Labs (3/25): TSH normal  ECG (personally reviewed): sinus tachycardia 112  6 minute walk (12/24): 457 m 6 minute walk (3/25): 488 m  PMH: 1. Fibromyalgia: Has seen rheumatology at [facility].  2. HTN 3. Hypothyroidism 4. Breast cancer: 2017, Treated with chemotherapy and mastectomy.  5. Pulmonary hypertension: Echo (10/24) with EF 50-55%, D-shaped septum suggestive of RV pressure/volume overload, RV poorly visualized but appeared dilated and dysfunctional.   - RHC/LHC (10/24): No significant CAD; mean RA 11, PA 76/38 mean 52, mean PCWP 10, PVR 7.3 WU, CI 3.04 - V/Q scan (10/24): No evidence for chronic PE.  - CTA chest (10/24): No acute PE, no evidence for lung parenchymal disease.  - PFTs (10/24): FVC 92%, FEV1 102%, TLC 101%, DLCO normal (surprising).   - Sleep study (10/24): mild OSA.  - CCP/RF negative. Anti-SCL70, HIV, and ANA negative.  6. Appendicitis: Appendectomy 1/25.   SH: Nonsmoker, no ETOH, married, originally from [place].   FH: No pulmonary hypertension or cardiomyopathy that she knows of.   ROS: All systems reviewed and negative except as per HPI.    Current Outpatient Medications  Medication Sig Dispense Refill   acetaminophen (TYLENOL) 500 MG tablet Take 2 tablets (1,000 mg total) by mouth every 6 (six) hours as needed.     ambrisentan (LETAIRIS) 10 MG tablet Take 1 tablet (10 mg total) by mouth daily. 30 tablet 11   Azelaic Acid 15 % gel Apply to face every morning 50 g 5   clobetasol cream (TEMOVATE) 0.05 % Apply 1 Application  topically as needed. 30 g 4   cromolyn (OPTICROM) 4 % ophthalmic solution Place 1 drop into both eyes 4 (four) times daily. 10 mL 4   ELIDEL 1 % cream Apply to red scaly areas on the face BID for seb derm 30 g 4   fluticasone (FLONASE) 50 MCG/ACT nasal spray Place 2 sprays into both nostrils as needed for allergies  or rhinitis.     furosemide (LASIX) 40 MG tablet Take 1 tablet (40 mg total) by mouth daily. 90 tablet 1   hydrALAZINE (APRESOLINE) 25 MG tablet Take 1 tablet (25 mg total) by mouth 2 (two) times daily. 180 tablet 1   Ivermectin (SOOLANTRA) 1 % CREA Apply 1 Application topically at bedtime. 45 g 5   levothyroxine (SYNTHROID) 112 MCG tablet Take 1 pill everyday except Sunday take half a pill 90 tablet 1   omeprazole (PRILOSEC) 40 MG capsule Take 40 mg by mouth as needed.     potassium chloride SA (KLOR-CON M) 20 MEQ tablet Take 1 tablet (20 mEq total) by mouth daily. 90 tablet 1   Selexipag 800 MCG TABS Take 1 tablet (800 mcg total) by mouth 2 (two) times daily. 60 tablet 11   tadalafil, PAH, (ADCIRCA) 20 MG tablet Take 2 tablets (40 mg total) by mouth daily. NDC's covered by plan: 62952841324, 40102725366, [id number]     valACYclovir (VALTREX) 1000 MG tablet Take 1,000 mg by mouth as needed.     No current facility-administered medications for this encounter.    Allergies  Allergen Reactions   Lisinopril Other (See Comments)    cough cough     PHYSICAL EXAM: BP 128/80   Pulse (!) 107   Wt 83.4 kg (183 lb 12.8 oz)   LMP  (LMP Unknown)   SpO2 95%   BMI 32.56 kg/m  General: NAD Neck: No JVD, no thyromegaly or thyroid nodule.  Lungs: Clear to auscultation bilaterally with normal respiratory effort. CV: Nondisplaced PMI.  Heart regular S1/S2, no S3/S4, no murmur.  No peripheral edema.  No carotid bruit.  Normal pedal pulses.  Abdomen: Soft, nontender, no hepatosplenomegaly, no distention.  Skin: Intact without lesions or rashes.  Neurologic: Alert and oriented x 3.  Psych: Normal affect. Extremities: No clubbing or cyanosis.  HEENT: Normal.   ASSESSMENT & PLAN:  1. Pulmonary hypertension/RV failure: Echo in 10/24 showed EF 50-55%; RV was poorly visualized but there was a D-shaped interventricular septum and concern for RV dilation/dysfunction.  RHC/LHC in 10/24 showed no CAD;  mean RA 11, PA 76/38 mean 52, mean PWP 10, PVR 7.3 WU and CI 3.04.  PE CT 10/24 showed no acute PE and no parenchymal lung disease.  V/Q scan 10/24 showed no chronic PE.  PFTs in 10/24 were normal (including, surprisingly, DLCO).  Rheumatology serologies and HIV were negative.  Sleep study showed mild OSA.  No FH of pulmonary hypertension.  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 NYHA class II-III, not volume overloaded on exam.  Good 6 minute walk.   - Continue Lasix 40 mg daily.  BMET/BNP.    - Continue tadalafil 40 mg daily, she feels like this has helped.  - Continue ambrisentan 10 mg daily.    - Continue to titrate up selexipag as tolerated to goal of 1600 mcg bid then will reassess.  Increase to 800 mcg bid.  - Eventual sotatercept.  - With ongoing symptoms, I will arrange for RHC and echo to make sure that  we are making progress.  We discussed risks/benefits and she agrees to procedure.   - Refer to pulmonary rehab at [facility].  2. HTN: BP controlled, she is only taking hydralazine 25 mg bid.  3. Fibromyalgia: With diffuse pain though improved recently.  Evaluation has not found a specific rheumatologic diagnosis.   She will followup with me in 2 months.   I spent 31 minutes reviewing records, interviewing/examining patient, and managing order   Marca Ancona 06/07/2023

## 2023-06-07 NOTE — H&P (View-Only) (Signed)
 ADVANCED HEART FAILURE CLINIC NOTE  Primary Care: Marjie Skiff, NP HF Cardiology: Dr. Shirlee Latch  Chief Complaint: Pulmonary hypertension  HPI: Tonya Odonnell is a 58 y.o. female with pulmonary hypertension, hypertension, breast cancer status postmastectomy and chemotherapy in 2017 complicated by severe neuropathic/psychosomatic pain, hypothyroidism and fibromyalgia.  She was admitted 10/24 at [facility] with progressive  dyspnea as well as pain.  Prior to that she was seen by [facility] rheumatology and diagnosed with fibromyalgia.  2 months prior to her hospitalization she reports having progressive exertional dyspnea with shortness of breath just walking to her mailbox.  2 weeks prior to admission this progressed to chest tightness both at rest and with exertion.  During her hospitalization, echocardiogram was done showing EF of 50 to 55% with D-shaped interventricular septum.  She underwent right and left heart catheterization that was significant for pulmonary hypertension with a PVR of 7.3 Wood units.  CTA chest during this admission showed no PE, no parenchymal lung disease.  PFTs in 10/24 were surprisingly normal.  V/Q scan in 10/24 showed no evidence for chronic PE.  She was subsequently started on tadalafil 20 mg and PH evaluation was started.  Sleep study showed mild OSA, pulmonary did not recommend CPAP.   In 1/25, she developed appendicitis and had uncomplicated laparoscopic appendectomy at [facility].   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  She gets jaw pain/diarrhea with increases in selexipag but symptoms will resolve eventually after each increase. She walks her dogs generally without dyspnea.  She is short of breath walking around large stores.  She is short of breath when the weather changes.  Generally weak and tired.  Occasional atypical chest pain.  Weight is stable.   Labs (2/24): CCP negative, RF negative Labs (10/24): BNP 42, anti-SCL 70 negative,  ANA negative, HIV negative Labs (11/24): K 4, creatinine 0.88 Labs (1/25): K 3.6, creatinine 0.86, LFTs normal, hgb 13.5, BNP 8 Labs (3/25): TSH normal  ECG (personally reviewed): sinus tachycardia 112  6 minute walk (12/24): 457 m 6 minute walk (3/25): 488 m  PMH: 1. Fibromyalgia: Has seen rheumatology at [facility].  2. HTN 3. Hypothyroidism 4. Breast cancer: 2017, Treated with chemotherapy and mastectomy.  5. Pulmonary hypertension: Echo (10/24) with EF 50-55%, D-shaped septum suggestive of RV pressure/volume overload, RV poorly visualized but appeared dilated and dysfunctional.   - RHC/LHC (10/24): No significant CAD; mean RA 11, PA 76/38 mean 52, mean PCWP 10, PVR 7.3 WU, CI 3.04 - V/Q scan (10/24): No evidence for chronic PE.  - CTA chest (10/24): No acute PE, no evidence for lung parenchymal disease.  - PFTs (10/24): FVC 92%, FEV1 102%, TLC 101%, DLCO normal (surprising).   - Sleep study (10/24): mild OSA.  - CCP/RF negative. Anti-SCL70, HIV, and ANA negative.  6. Appendicitis: Appendectomy 1/25.   SH: Nonsmoker, no ETOH, married, originally from [place].   FH: No pulmonary hypertension or cardiomyopathy that she knows of.   ROS: All systems reviewed and negative except as per HPI.    Current Outpatient Medications  Medication Sig Dispense Refill   acetaminophen (TYLENOL) 500 MG tablet Take 2 tablets (1,000 mg total) by mouth every 6 (six) hours as needed.     ambrisentan (LETAIRIS) 10 MG tablet Take 1 tablet (10 mg total) by mouth daily. 30 tablet 11   Azelaic Acid 15 % gel Apply to face every morning 50 g 5   clobetasol cream (TEMOVATE) 0.05 % Apply 1 Application  topically as needed. 30 g 4   cromolyn (OPTICROM) 4 % ophthalmic solution Place 1 drop into both eyes 4 (four) times daily. 10 mL 4   ELIDEL 1 % cream Apply to red scaly areas on the face BID for seb derm 30 g 4   fluticasone (FLONASE) 50 MCG/ACT nasal spray Place 2 sprays into both nostrils as needed for allergies  or rhinitis.     furosemide (LASIX) 40 MG tablet Take 1 tablet (40 mg total) by mouth daily. 90 tablet 1   hydrALAZINE (APRESOLINE) 25 MG tablet Take 1 tablet (25 mg total) by mouth 2 (two) times daily. 180 tablet 1   Ivermectin (SOOLANTRA) 1 % CREA Apply 1 Application topically at bedtime. 45 g 5   levothyroxine (SYNTHROID) 112 MCG tablet Take 1 pill everyday except Sunday take half a pill 90 tablet 1   omeprazole (PRILOSEC) 40 MG capsule Take 40 mg by mouth as needed.     potassium chloride SA (KLOR-CON M) 20 MEQ tablet Take 1 tablet (20 mEq total) by mouth daily. 90 tablet 1   Selexipag 800 MCG TABS Take 1 tablet (800 mcg total) by mouth 2 (two) times daily. 60 tablet 11   tadalafil, PAH, (ADCIRCA) 20 MG tablet Take 2 tablets (40 mg total) by mouth daily. NDC's covered by plan: 62952841324, [id number], [id number]     valACYclovir (VALTREX) 1000 MG tablet Take 1,000 mg by mouth as needed.     No current facility-administered medications for this encounter.    Allergies  Allergen Reactions   Lisinopril Other (See Comments)    cough cough     PHYSICAL EXAM: BP 128/80   Pulse (!) 107   Wt 83.4 kg (183 lb 12.8 oz)   LMP  (LMP Unknown)   SpO2 95%   BMI 32.56 kg/m  General: NAD Neck: No JVD, no thyromegaly or thyroid nodule.  Lungs: Clear to auscultation bilaterally with normal respiratory effort. CV: Nondisplaced PMI.  Heart regular S1/S2, no S3/S4, no murmur.  No peripheral edema.  No carotid bruit.  Normal pedal pulses.  Abdomen: Soft, nontender, no hepatosplenomegaly, no distention.  Skin: Intact without lesions or rashes.  Neurologic: Alert and oriented x 3.  Psych: Normal affect. Extremities: No clubbing or cyanosis.  HEENT: Normal.   ASSESSMENT & PLAN:  1. Pulmonary hypertension/RV failure: Echo in 10/24 showed EF 50-55%; RV was poorly visualized but there was a D-shaped interventricular septum and concern for RV dilation/dysfunction.  RHC/LHC in 10/24 showed no CAD;  mean RA 11, PA 76/38 mean 52, mean PWP 10, PVR 7.3 WU and CI 3.04.  PE CT 10/24 showed no acute PE and no parenchymal lung disease.  V/Q scan 10/24 showed no chronic PE.  PFTs in 10/24 were normal (including, surprisingly, DLCO).  Rheumatology serologies and HIV were negative.  Sleep study showed mild OSA.  No FH of pulmonary hypertension.  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 NYHA class II-III, not volume overloaded on exam.  Good 6 minute walk.   - Continue Lasix 40 mg daily.  BMET/BNP.    - Continue tadalafil 40 mg daily, she feels like this has helped.  - Continue ambrisentan 10 mg daily.    - Continue to titrate up selexipag as tolerated to goal of 1600 mcg bid then will reassess.  Increase to 800 mcg bid.  - Eventual sotatercept.  - With ongoing symptoms, I will arrange for RHC and echo to make sure that  we are making progress.  We discussed risks/benefits and she agrees to procedure.   - Refer to pulmonary rehab at [facility].  2. HTN: BP controlled, she is only taking hydralazine 25 mg bid.  3. Fibromyalgia: With diffuse pain though improved recently.  Evaluation has not found a specific rheumatologic diagnosis.   She will followup with me in 2 months.   I spent 31 minutes reviewing records, interviewing/examining patient, and managing order   Marca Ancona 06/07/2023

## 2023-06-10 ENCOUNTER — Other Ambulatory Visit: Payer: Self-pay

## 2023-06-11 ENCOUNTER — Other Ambulatory Visit: Payer: Self-pay

## 2023-06-12 ENCOUNTER — Other Ambulatory Visit: Payer: Self-pay | Admitting: Pharmacy Technician

## 2023-06-12 ENCOUNTER — Other Ambulatory Visit: Payer: Self-pay

## 2023-06-12 NOTE — Progress Notes (Signed)
 Specialty Pharmacy Refill Coordination Note  Tonya Odonnell is a 58 y.o. female contacted today regarding refills of specialty medication(s) Ambrisentan [name]   Patient requested Delivery   Delivery date: 06/18/23   Verified address: [address]  [place]   Medication will be filled on 06/17/23.    MD [facility] [id number] [id number] AUTH # [id number] EXP 06/19/2023

## 2023-06-13 ENCOUNTER — Other Ambulatory Visit (HOSPITAL_COMMUNITY): Payer: Self-pay

## 2023-06-13 DIAGNOSIS — Z7689 Persons encountering health services in other specified circumstances: Secondary | ICD-10-CM | POA: Diagnosis not present

## 2023-06-17 ENCOUNTER — Other Ambulatory Visit: Payer: Self-pay

## 2023-06-17 ENCOUNTER — Ambulatory Visit: Admitting: Dermatology

## 2023-06-20 DIAGNOSIS — Z419 Encounter for procedure for purposes other than remedying health state, unspecified: Secondary | ICD-10-CM | POA: Diagnosis not present

## 2023-06-22 ENCOUNTER — Other Ambulatory Visit: Payer: Self-pay

## 2023-06-22 ENCOUNTER — Other Ambulatory Visit (HOSPITAL_COMMUNITY): Payer: Self-pay

## 2023-06-22 DIAGNOSIS — Z7689 Persons encountering health services in other specified circumstances: Secondary | ICD-10-CM | POA: Diagnosis not present

## 2023-06-25 DIAGNOSIS — Z7689 Persons encountering health services in other specified circumstances: Secondary | ICD-10-CM | POA: Diagnosis not present

## 2023-06-29 ENCOUNTER — Telehealth (HOSPITAL_COMMUNITY): Payer: Self-pay | Admitting: Cardiology

## 2023-06-29 NOTE — Telephone Encounter (Signed)
 Pt aware of instructions  Would like to have b/p evaluated Add on 4/22 1030

## 2023-06-29 NOTE — Telephone Encounter (Signed)
 Patient left VM on triage line reporting  Stange symptoms 2 hours ago @1300  Reports she was sitting in chair and chest started pounding, reports she became dizzy and vision became blurry   reports she went to lay down and vision still blurry    Returned call pt reports she is still laying down resting. Vision still blurry and increased fatigue -afraid to go to ER and does not want to wait long time for evaluation     Denies recent changes Denies chest pain, arm pain Unable to check vitals Denies SOB No weights to report   Please advise

## 2023-06-30 ENCOUNTER — Inpatient Hospital Stay (HOSPITAL_COMMUNITY)
Admission: RE | Admit: 2023-06-30 | Discharge: 2023-06-30 | Disposition: A | Discharge status: Home / Self Care | Source: Ambulatory Visit

## 2023-06-30 DIAGNOSIS — Z7689 Persons encountering health services in other specified circumstances: Secondary | ICD-10-CM | POA: Diagnosis not present

## 2023-06-30 NOTE — Progress Notes (Incomplete)
 ADVANCED HEART FAILURE CLINIC NOTE  Primary Care: Lemar Pyles, NP HF Cardiology: Dr. Mitzie Anda  Reason for Visit: Pulmonary Hypertension Follow-up HPI: Tonya Odonnell is a 58 y.o. female with pulmonary hypertension, hypertension, breast cancer status postmastectomy and chemotherapy in 2017 complicated by severe neuropathic/psychosomatic pain, hypothyroidism and fibromyalgia.  She was admitted 10/24 at [facility] with progressive dyspnea as well as pain.  Prior to that she was seen by [facility] rheumatology and diagnosed with fibromyalgia.  2 months prior to her hospitalization she reports having progressive exertional dyspnea with shortness of breath just walking to her mailbox.  2 weeks prior to admission this progressed to chest tightness both at rest and with exertion.  During her hospitalization, echocardiogram was done showing EF of 50 to 55% with D-shaped interventricular septum.  She underwent right and left heart catheterization that was significant for pulmonary hypertension with a PVR of 7.3 Wood units.  CTA chest during this admission showed no PE, no parenchymal lung disease.  PFTs in 10/24 were surprisingly normal.  V/Q scan in 10/24 showed no evidence for chronic PE.  She was subsequently started on tadalafil  20 mg and PH evaluation was started.  Sleep study showed mild OSA, pulmonary did not recommend CPAP.   In 1/25, she developed appendicitis and had uncomplicated laparoscopic appendectomy at [facility].   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  She has been titrating up selexipag  recently.  She has done well with this medication so far with minimal symptoms.  She has noted poor appetite and abdominal fullness post-appendectomy a couple of weeks ago, but this is improving.  She walks her dogs for 15-20 minutes/day, no dyspnea walking on flat ground.  Dyspnea with hills, stairs.  No chest pain.  No lightheadedness or syncope.  Weight is stable.  Not having much  fibromyalgia pain.    She returns today for RV failure and pulmonary hypertension follow up. Overall feeling well. NYHA ***. Reports {Symptoms; cardiac:12860::"dyspnea","fatigue"}. Denies {Symptoms; cardiac:12860::"chest pain","dyspnea","fatigue","near-syncope","orthopnea","palpitations","dizziness","abnormal bleeding"}. Able to perform ADLs. Appetite okay. Weight at home ***. BP at home***. Compliant with all medications.     PMH: 1. Fibromyalgia: Has seen rheumatology at [facility].  2. HTN 3. Hypothyroidism 4. Breast cancer: 2017, Treated with chemotherapy and mastectomy.  5. Pulmonary hypertension: Echo (10/24) with EF 50-55%, D-shaped septum suggestive of RV pressure/volume overload, RV poorly visualized but appeared dilated and dysfunctional.   - RHC/LHC (10/24): No significant CAD; mean RA 11, PA 76/38 mean 52, mean PCWP 10, PVR 7.3 WU, CI 3.04 - V/Q scan (10/24): No evidence for chronic PE.  - CTA chest (10/24): No acute PE, no evidence for lung parenchymal disease.  - PFTs (10/24): FVC 92%, FEV1 102%, TLC 101%, DLCO normal (surprising).   - Sleep study (10/24): mild OSA.  - CCP/RF negative. Anti-SCL70, HIV, and ANA negative.  6. Appendicitis: Appendectomy 1/25.   SH: Nonsmoker, no ETOH, married, originally from [place].   FH: No pulmonary hypertension or cardiomyopathy that she knows of.   ROS: All systems reviewed and negative except as per HPI.   Current Outpatient Medications  Medication Sig Dispense Refill   acetaminophen  (TYLENOL ) 500 MG tablet Take 2 tablets (1,000 mg total) by mouth every 6 (six) hours as needed.     ambrisentan  (LETAIRIS ) 10 MG tablet Take 1 tablet (10 mg total) by mouth daily. 30 tablet 11   Azelaic Acid  15 % gel Apply to face every morning 50 g 5   clobetasol  cream (TEMOVATE )  0.05 % Apply 1 Application topically as needed. 30 g 4   cromolyn  (OPTICROM ) 4 % ophthalmic solution Place 1 drop into both eyes 4 (four) times daily. 10 mL 4   ELIDEL  1 % cream  Apply to red scaly areas on the face BID for seb derm 30 g 4   fluticasone  (FLONASE ) 50 MCG/ACT nasal spray Place 2 sprays into both nostrils as needed for allergies or rhinitis.     furosemide  (LASIX ) 40 MG tablet Take 1 tablet (40 mg total) by mouth daily. 90 tablet 1   hydrALAZINE  (APRESOLINE ) 25 MG tablet Take 1 tablet (25 mg total) by mouth 2 (two) times daily. 180 tablet 1   Ivermectin  (SOOLANTRA ) 1 % CREA Apply 1 Application topically at bedtime. 45 g 5   levothyroxine  (SYNTHROID ) 112 MCG tablet Take 1 pill everyday except Sunday take half a pill 90 tablet 1   omeprazole  (PRILOSEC) 40 MG capsule Take 40 mg by mouth as needed.     potassium chloride  SA (KLOR-CON  M) 20 MEQ tablet Take 1 tablet (20 mEq total) by mouth daily. 90 tablet 1   Selexipag  800 MCG TABS Take 1 tablet (800 mcg total) by mouth 2 (two) times daily. 60 tablet 11   tadalafil , PAH, (ADCIRCA ) 20 MG tablet Take 2 tablets (40 mg total) by mouth daily. NDC's covered by plan: [id number], [id number], [id number]     valACYclovir  (VALTREX ) 1000 MG tablet Take 1,000 mg by mouth as needed.     No current facility-administered medications for this visit.    Allergies  Allergen Reactions   Lisinopril Other (See Comments)    cough    There were no vitals filed for this visit. There were no vitals filed for this visit.  PHYSICAL EXAM: General: NAD Neck: Thick. No JVD, no thyromegaly or thyroid  nodule.  Lungs: Clear to auscultation bilaterally with normal respiratory effort. CV: Nondisplaced PMI.  Heart regular S1/S2, no S3/S4, no murmur.  No peripheral edema.  No carotid bruit.  Normal pedal pulses.  Abdomen: Soft, nontender, no hepatosplenomegaly, no distention.  Skin: Intact without lesions or rashes.  Neurologic: Alert and oriented x 3.  Psych: Normal affect. Extremities: No clubbing or cyanosis.  HEENT: Normal.   ECG (personally reviewed): sinus tachycardia otherwise normal. ***  6 minute walk (12/24): 457  m  ASSESSMENT & PLAN:  1. Pulmonary hypertension/RV failure: Echo in 10/24 showed EF 50-55%; RV was poorly visualized but there was a D-shaped interventricular septum and concern for RV dilation/dysfunction.  RHC/LHC in 10/24 showed no CAD; mean RA 11, PA 76/38 mean 52, mean PWP 10, PVR 7.3 WU and CI 3.04.  PE CT 10/24 showed no acute PE and no parenchymal lung disease.  V/Q scan 10/24 showed no chronic PE.  PFTs in 10/24 were normal (including, surprisingly, DLCO).  Rheumatology serologies and HIV were negative.  Sleep study showed mild OSA.  No FH of pulmonary hypertension.  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  - She is not volume overloaded on exam, NYHA class II symptoms.  - Continue Lasix  40 mg daily.  BMET/BNP.    - Continue tadalafil  40 mg daily, she feels like this has helped.  - She is tolerating ambrisentan  10 mg. Recent LFTs normal.   - Continue to titrate up selexipag  as tolerated to goal of 1600 mcg bid then will reassess.  - Eventual sotatercept.  - Defer 6 minute walk today given recent appendectomy.  Will do at follow up  appt.   - Scheduled for RHC 07/02/23 with Dr. Mitzie Anda   2. HTN: BP controlled, she is only taking hydralazine  25 mg bid.   3. Fibromyalgia: With diffuse pain though improved recently.  Evaluation has not found a specific rheumatologic diagnosis.   Follow up with *** in ***  [place] Tashari Schoenfelder, NP 06/30/2023

## 2023-07-01 ENCOUNTER — Telehealth (HOSPITAL_COMMUNITY): Payer: Self-pay

## 2023-07-01 NOTE — Telephone Encounter (Signed)
 Spoke with patient regarding procedure instructions for RHC tomorrow 4/24. Advised patient of time, location, and medication/npo instructions.   Patient aware of all instructions and verbalized understanding.   Advised patient to call back to office with any issues, questions, or concerns. Patient verbalized understanding.

## 2023-07-02 ENCOUNTER — Encounter (HOSPITAL_COMMUNITY)
Admission: RE | Disposition: A | Discharge status: Home / Self Care | Payer: Self-pay | Source: Home / Self Care | Attending: Cardiology

## 2023-07-02 ENCOUNTER — Other Ambulatory Visit: Payer: Self-pay

## 2023-07-02 ENCOUNTER — Ambulatory Visit (HOSPITAL_COMMUNITY)
Admission: RE | Admit: 2023-07-02 | Discharge: 2023-07-02 | Disposition: A | Discharge status: Home / Self Care | Attending: Cardiology | Admitting: Cardiology

## 2023-07-02 ENCOUNTER — Encounter (HOSPITAL_COMMUNITY): Payer: Self-pay | Admitting: Cardiology

## 2023-07-02 DIAGNOSIS — Z9010 Acquired absence of unspecified breast and nipple: Secondary | ICD-10-CM | POA: Insufficient documentation

## 2023-07-02 DIAGNOSIS — M797 Fibromyalgia: Secondary | ICD-10-CM | POA: Diagnosis not present

## 2023-07-02 DIAGNOSIS — Z853 Personal history of malignant neoplasm of breast: Secondary | ICD-10-CM | POA: Insufficient documentation

## 2023-07-02 DIAGNOSIS — I10 Essential (primary) hypertension: Secondary | ICD-10-CM | POA: Diagnosis not present

## 2023-07-02 DIAGNOSIS — I272 Pulmonary hypertension, unspecified: Secondary | ICD-10-CM | POA: Insufficient documentation

## 2023-07-02 DIAGNOSIS — Z7689 Persons encountering health services in other specified circumstances: Secondary | ICD-10-CM | POA: Diagnosis not present

## 2023-07-02 HISTORY — PX: RIGHT HEART CATH: CATH118263

## 2023-07-02 LAB — POCT I-STAT EG7
Acid-base deficit: 1 mmol/L (ref 0.0–2.0)
Acid-base deficit: 2 mmol/L (ref 0.0–2.0)
Bicarbonate: 23.5 mmol/L (ref 20.0–28.0)
Bicarbonate: 23.9 mmol/L (ref 20.0–28.0)
Calcium, Ion: 1.22 mmol/L (ref 1.15–1.40)
Calcium, Ion: 1.23 mmol/L (ref 1.15–1.40)
HCT: 40 % (ref 36.0–46.0)
HCT: 41 % (ref 36.0–46.0)
Hemoglobin: 13.6 g/dL (ref 12.0–15.0)
Hemoglobin: 13.9 g/dL (ref 12.0–15.0)
O2 Saturation: 71 %
O2 Saturation: 71 %
Potassium: 3.4 mmol/L — ABNORMAL LOW (ref 3.5–5.1)
Potassium: 3.4 mmol/L — ABNORMAL LOW (ref 3.5–5.1)
Sodium: 141 mmol/L (ref 135–145)
Sodium: 142 mmol/L (ref 135–145)
TCO2: 25 mmol/L (ref 22–32)
TCO2: 25 mmol/L (ref 22–32)
pCO2, Ven: 40.7 mmHg — ABNORMAL LOW (ref 44–60)
pCO2, Ven: 41 mmHg — ABNORMAL LOW (ref 44–60)
pH, Ven: 7.37 (ref 7.25–7.43)
pH, Ven: 7.374 (ref 7.25–7.43)
pO2, Ven: 38 mmHg (ref 32–45)
pO2, Ven: 38 mmHg (ref 32–45)

## 2023-07-02 LAB — CBC
HCT: 43.1 % (ref 36.0–46.0)
Hemoglobin: 14.2 g/dL (ref 12.0–15.0)
MCH: 28.5 pg (ref 26.0–34.0)
MCHC: 32.9 g/dL (ref 30.0–36.0)
MCV: 86.4 fL (ref 80.0–100.0)
Platelets: 270 10*3/uL (ref 150–400)
RBC: 4.99 MIL/uL (ref 3.87–5.11)
RDW: 13.2 % (ref 11.5–15.5)
WBC: 6 10*3/uL (ref 4.0–10.5)
nRBC: 0 % (ref 0.0–0.2)

## 2023-07-02 SURGERY — RIGHT HEART CATH
Anesthesia: LOCAL

## 2023-07-02 MED ORDER — SODIUM CHLORIDE 0.9% FLUSH: 3.0000 mL | Freq: Two times a day (BID) | INTRAVENOUS | Status: DC

## 2023-07-02 MED ORDER — ONDANSETRON HCL 4 MG/2ML IJ SOLN
4.0000 mg | Freq: Four times a day (QID) | INTRAMUSCULAR | Status: DC | PRN
Start: 2023-07-02 — End: 2023-07-02

## 2023-07-02 MED ORDER — SODIUM CHLORIDE 0.9 % IV SOLN
INTRAVENOUS | Status: DC
Start: 2023-07-02 — End: 2023-07-02

## 2023-07-02 MED ORDER — LIDOCAINE HCL (PF) 1 % IJ SOLN
INTRAMUSCULAR | Status: AC
  Filled 2023-07-02: qty 30

## 2023-07-02 MED ORDER — ACETAMINOPHEN 325 MG PO TABS: 650.0000 mg | ORAL_TABLET | ORAL | Status: DC | PRN

## 2023-07-02 MED ORDER — FENTANYL CITRATE (PF) 100 MCG/2ML IJ SOLN
INTRAMUSCULAR | Status: DC | PRN
  Administered 2023-07-02: 25 ug via INTRAVENOUS

## 2023-07-02 MED ORDER — LIDOCAINE HCL (PF) 1 % IJ SOLN
INTRAMUSCULAR | Status: DC | PRN
Start: 2023-07-02 — End: 2023-07-02
  Administered 2023-07-02: 2 mL

## 2023-07-02 MED ORDER — LABETALOL HCL 5 MG/ML IV SOLN: 10.0000 mg | INTRAVENOUS | Status: DC | PRN

## 2023-07-02 MED ORDER — HEPARIN (PORCINE) IN NACL 1000-0.9 UT/500ML-% IV SOLN
INTRAVENOUS | Status: DC | PRN
  Administered 2023-07-02: 500 mL

## 2023-07-02 MED ORDER — SODIUM CHLORIDE 0.9 % IV SOLN: 250.0000 mL | INTRAVENOUS | Status: DC | PRN

## 2023-07-02 MED ORDER — MIDAZOLAM HCL 2 MG/2ML IJ SOLN
INTRAMUSCULAR | Status: AC
  Filled 2023-07-02: qty 2

## 2023-07-02 MED ORDER — FENTANYL CITRATE (PF) 100 MCG/2ML IJ SOLN
INTRAMUSCULAR | Status: AC
  Filled 2023-07-02: qty 2

## 2023-07-02 MED ORDER — MIDAZOLAM HCL 2 MG/2ML IJ SOLN
INTRAMUSCULAR | Status: DC | PRN
  Administered 2023-07-02: 1 mg via INTRAVENOUS

## 2023-07-02 MED ORDER — HYDRALAZINE HCL 20 MG/ML IJ SOLN: 10.0000 mg | INTRAMUSCULAR | Status: DC | PRN

## 2023-07-02 MED ORDER — SODIUM CHLORIDE 0.9% FLUSH: 3.0000 mL | INTRAVENOUS | Status: DC | PRN

## 2023-07-02 SURGICAL SUPPLY — 3 items
CATH BALLN WEDGE 5F 110CM (CATHETERS) IMPLANT
PACK CARDIAC CATHETERIZATION (CUSTOM PROCEDURE TRAY) ×1 IMPLANT
SHEATH GLIDE SLENDER 4/5FR (SHEATH) IMPLANT

## 2023-07-02 NOTE — Discharge Instructions (Signed)

## 2023-07-02 NOTE — Interval H&P Note (Signed)
 History and Physical Interval Note:  07/02/2023 7:21 AM  Tonya Odonnell  has presented today for surgery, with the diagnosis of pulmonary hypertension.  The various methods of treatment have been discussed with the patient and family. After consideration of risks, benefits and other options for treatment, the patient has consented to  Procedure(s): RIGHT HEART CATH (N/A) as a surgical intervention.  The patient's history has been reviewed, patient examined, no change in status, stable for surgery.  I have reviewed the patient's chart and labs.  Questions were answered to the patient's satisfaction.     Fountain Derusha [employer]

## 2023-07-06 DIAGNOSIS — Z7689 Persons encountering health services in other specified circumstances: Secondary | ICD-10-CM | POA: Diagnosis not present

## 2023-07-10 ENCOUNTER — Other Ambulatory Visit: Payer: Self-pay | Admitting: Pharmacy Technician

## 2023-07-10 ENCOUNTER — Other Ambulatory Visit: Payer: Self-pay

## 2023-07-10 ENCOUNTER — Other Ambulatory Visit (HOSPITAL_COMMUNITY): Payer: Self-pay

## 2023-07-10 NOTE — Progress Notes (Signed)
 Specialty Pharmacy Refill Coordination Note  Tonya Odonnell is a 58 y.o. female contacted today regarding refills of specialty medication(s) Ambrisentan  (LETAIRIS )   Patient requested Delivery   Delivery date: 07/15/23   Verified address: [address] [place] [address]   Medication will be filled on 07/14/23.

## 2023-07-13 ENCOUNTER — Other Ambulatory Visit (HOSPITAL_COMMUNITY): Payer: Self-pay

## 2023-07-13 DIAGNOSIS — Z7689 Persons encountering health services in other specified circumstances: Secondary | ICD-10-CM | POA: Diagnosis not present

## 2023-07-14 ENCOUNTER — Other Ambulatory Visit: Payer: Self-pay

## 2023-07-14 ENCOUNTER — Other Ambulatory Visit (HOSPITAL_COMMUNITY): Payer: Self-pay

## 2023-07-17 DIAGNOSIS — Z7689 Persons encountering health services in other specified circumstances: Secondary | ICD-10-CM | POA: Diagnosis not present

## 2023-07-20 ENCOUNTER — Encounter: Attending: Cardiology

## 2023-07-20 DIAGNOSIS — Z419 Encounter for procedure for purposes other than remedying health state, unspecified: Secondary | ICD-10-CM | POA: Diagnosis not present

## 2023-07-20 DIAGNOSIS — I272 Pulmonary hypertension, unspecified: Secondary | ICD-10-CM | POA: Insufficient documentation

## 2023-07-20 NOTE — Progress Notes (Signed)
 Virtual Visit completed. Patient informed on EP and RD appointment and 6 Minute walk test. Patient also informed of patient health questionnaires on My Chart. Patient Verbalizes understanding. Visit diagnosis can be found in [facility] 06/05/2023.

## 2023-07-21 DIAGNOSIS — Z7689 Persons encountering health services in other specified circumstances: Secondary | ICD-10-CM | POA: Diagnosis not present

## 2023-07-22 ENCOUNTER — Encounter

## 2023-07-22 VITALS — Ht 63.2 in | Wt 180.3 lb

## 2023-07-22 DIAGNOSIS — I272 Pulmonary hypertension, unspecified: Secondary | ICD-10-CM

## 2023-07-22 DIAGNOSIS — Z7689 Persons encountering health services in other specified circumstances: Secondary | ICD-10-CM | POA: Diagnosis not present

## 2023-07-22 NOTE — Progress Notes (Signed)
 Pulmonary Individual Treatment Plan  Patient Details  Name: Tonya Odonnell MRN: [id number] Date of Birth: 01-29-1966 Referring Provider:   Flowsheet Row Pulmonary Rehab from 07/22/2023 in [facility] Cardiac and Pulmonary Rehab  Referring Provider Peder Bourdon, MD       Initial Encounter Date:  Flowsheet Row Pulmonary Rehab from 07/22/2023 in [facility] Cardiac and Pulmonary Rehab  Date 07/22/23       Visit Diagnosis: Pulmonary hypertension (HCC)  Patient's Home Medications on Admission:  Current Outpatient Medications:    ambrisentan  (LETAIRIS ) 10 MG tablet, Take 1 tablet (10 mg total) by mouth daily., Disp: 30 tablet, Rfl: 11   Azelaic Acid  15 % gel, Apply to face every morning, Disp: 50 g, Rfl: 5   bismuth subsalicylate (PEPTO BISMOL) 262 MG/15ML suspension, Take 30 mLs by mouth every 6 (six) hours as needed for diarrhea or loose stools., Disp: , Rfl:    clobetasol  cream (TEMOVATE ) 0.05 %, Apply 1 Application topically as needed., Disp: 30 g, Rfl: 4   cromolyn  (OPTICROM ) 4 % ophthalmic solution, Place 1 drop into both eyes 4 (four) times daily. (Patient taking differently: Place 1 drop into both eyes daily as needed (Dry eye).), Disp: 10 mL, Rfl: 4   ELIDEL  1 % cream, Apply to red scaly areas on the face BID for seb derm, Disp: 30 g, Rfl: 4   fluticasone  (FLONASE ) 50 MCG/ACT nasal spray, Place 2 sprays into both nostrils daily as needed for allergies or rhinitis., Disp: , Rfl:    furosemide  (LASIX ) 40 MG tablet, Take 1 tablet (40 mg total) by mouth daily., Disp: 90 tablet, Rfl: 1   hydrALAZINE  (APRESOLINE ) 25 MG tablet, Take 1 tablet (25 mg total) by mouth 2 (two) times daily., Disp: 180 tablet, Rfl: 1   Ivermectin  (SOOLANTRA ) 1 % CREA, Apply 1 Application topically at bedtime. (Patient not taking: Reported on 06/30/2023), Disp: 45 g, Rfl: 5   levothyroxine  (SYNTHROID ) 112 MCG tablet, Take 1 pill everyday except Sunday take half a pill, Disp: 90 tablet, Rfl: 1   omeprazole  (PRILOSEC) 40 MG  capsule, Take 40 mg by mouth daily as needed (Heartburn)., Disp: , Rfl:    potassium chloride  SA (KLOR-CON  M) 20 MEQ tablet, Take 1 tablet (20 mEq total) by mouth daily., Disp: 90 tablet, Rfl: 1   Selexipag  800 MCG TABS, Take 1 tablet (800 mcg total) by mouth 2 (two) times daily. (Patient taking differently: Take 800 mcg by mouth 2 (two) times daily. Uptravi ), Disp: 60 tablet, Rfl: 11   tadalafil , PAH, (ADCIRCA ) 20 MG tablet, Take 2 tablets (40 mg total) by mouth daily. NDC's covered by plan: 66063016010, [id number], [id number], Disp: , Rfl:    valACYclovir  (VALTREX ) 1000 MG tablet, Take 1,000 mg by mouth daily as needed (Cold Sores)., Disp: , Rfl:   Past Medical History: Past Medical History:  Diagnosis Date   Anemia    "USE TO HAVE IT,NOT NOW,2015,2016   Arthritis    HIPS,KNEES,HANDS   Breast cancer in female (HCC)    Cancer (HCC)    LEFT   Hyperlipidemia    Hypertension    Low iron    Neuromuscular disorder (HCC)    MUSCLE PAIN,FIBROMYALGIA   Osteopenia    Pulmonary HTN (HCC)    PVC (premature ventricular contraction)    Thyroid  disease     Tobacco Use: Social History   Tobacco Use  Smoking Status Never  Smokeless Tobacco Never    Labs: Review Flowsheet  More data exists  Latest Ref Rng & Units 12/10/2022 12/11/2022 12/12/2022 12/22/2022 07/02/2023  Labs for ITP Cardiac and Pulmonary Rehab  Cholestrol 0 - 200 mg/dL - 161  - - -  LDL (calc) 0 - 99 mg/dL - 096  - - -  HDL-C >04 mg/dL - 65  - - -  Trlycerides <150 mg/dL - 78  - - -  Hemoglobin A1c 4.8 - 5.6 % 5.7  - - 5.7  -  PH, Arterial 7.35 - 7.45 - - 7.399  - -  PCO2 arterial 32 - 48 mmHg - - 35.2  - -  Bicarbonate 20.0 - 28.0 mmol/L - - 21.8  24.0  - 23.9  23.5   TCO2 22 - 32 mmol/L - - 23  25  - 25  25   Acid-base deficit 0.0 - 2.0 mmol/L - - 2.0  1.0  - 1.0  2.0   O2 Saturation % - - 94  66  - 71  71     Details       Multiple values from one day are sorted in reverse-chronological order           Pulmonary Assessment Scores:  Pulmonary Assessment Scores     Row Name 07/22/23 1551         mMRC Score   mMRC Score 4              UCSD: Self-administered rating of dyspnea associated with activities of daily living (ADLs) 6-point scale (0 = "not at all" to 5 = "maximal or unable to do because of breathlessness")  Scoring Scores range from 0 to 120.  Minimally important difference is 5 units  CAT: CAT can identify the health impairment of COPD patients and is better correlated with disease progression.  CAT has a scoring range of zero to 40. The CAT score is classified into four groups of low (less than 10), medium (10 - 20), high (21-30) and very high (31-40) based on the impact level of disease on health status. A CAT score over 10 suggests significant symptoms.  A worsening CAT score could be explained by an exacerbation, poor medication adherence, poor inhaler technique, or progression of COPD or comorbid conditions.  CAT MCID is 2 points  mMRC: mMRC (Modified Medical Research Council) Dyspnea Scale is used to assess the degree of baseline functional disability in patients of respiratory disease due to dyspnea. No minimal important difference is established. A decrease in score of 1 point or greater is considered a positive change.   Pulmonary Function Assessment:  Pulmonary Function Assessment - 07/20/23 1335       Breath   Shortness of Breath Yes;Limiting activity             Exercise Target Goals: Exercise Program Goal: Individual exercise prescription set using results from initial 6 min walk test and THRR while considering  patient's activity barriers and safety.   Exercise Prescription Goal: Initial exercise prescription builds to 30-45 minutes a day of aerobic activity, 2-3 days per week.  Home exercise guidelines will be given to patient during program as part of exercise prescription that the participant will acknowledge.  Education: Aerobic  Exercise: - Group verbal and visual presentation on the components of exercise prescription. Introduces F.I.T.T principle from ACSM for exercise prescriptions.  Reviews F.I.T.T. principles of aerobic exercise including progression. Written material given at graduation.   Education: Resistance Exercise: - Group verbal and visual presentation on the components of exercise prescription. Introduces F.I.T.T principle  from ACSM for exercise prescriptions  Reviews F.I.T.T. principles of resistance exercise including progression. Written material given at graduation.    Education: Exercise & Equipment Safety: - Individual verbal instruction and demonstration of equipment use and safety with use of the equipment. Flowsheet Row Pulmonary Rehab from 07/22/2023 in [facility] Cardiac and Pulmonary Rehab  Date 07/22/23  Educator MB  Instruction Review Code 1- Verbalizes Understanding       Education: Exercise Physiology & General Exercise Guidelines: - Group verbal and written instruction with models to review the exercise physiology of the cardiovascular system and associated critical values. Provides general exercise guidelines with specific guidelines to those with heart or lung disease.    Education: Flexibility, Balance, Mind/Body Relaxation: - Group verbal and visual presentation with interactive activity on the components of exercise prescription. Introduces F.I.T.T principle from ACSM for exercise prescriptions. Reviews F.I.T.T. principles of flexibility and balance exercise training including progression. Also discusses the mind body connection.  Reviews various relaxation techniques to help reduce and manage stress (i.e. Deep breathing, progressive muscle relaxation, and visualization). Balance handout provided to take home. Written material given at graduation.   Activity Barriers & Risk Stratification:  Activity Barriers & Cardiac Risk Stratification - 07/22/23 1544       Activity Barriers &  Cardiac Risk Stratification   Activity Barriers Fibromyalgia;Back Problems;Shortness of Breath;Other (comment)    Comments Pain in whole body (the more she moves as the day goes on, the more pain there is), lymphodema             6 Minute Walk:  6 Minute Walk     Row Name 07/22/23 1541         6 Minute Walk   Phase Initial     Distance 1225 feet     Walk Time 6 minutes     # of Rest Breaks 0     MPH 2.32     METS 3.28     RPE 11     Perceived Dyspnea  0     VO2 Peak 11.48     Symptoms Yes (comment)     Comments Nerve pain (same pain as resting)     Resting HR 92 bpm     Resting BP 120/84     Resting Oxygen Saturation  90 %     Exercise Oxygen Saturation  during 6 min walk 90 %     Max Ex. HR 109 bpm     Max Ex. BP 132/88     2 Minute Post BP 126/88       Interval HR   1 Minute HR 100     2 Minute HR 107     3 Minute HR 109     4 Minute HR 108     5 Minute HR 108     6 Minute HR 108     2 Minute Post HR 89     Interval Heart Rate? Yes       Interval Oxygen   Interval Oxygen? Yes     Baseline Oxygen Saturation % 90 %     1 Minute Oxygen Saturation % 90 %     1 Minute Liters of Oxygen 0 L     2 Minute Oxygen Saturation % 90 %     2 Minute Liters of Oxygen 0 L     3 Minute Oxygen Saturation % 91 %     3 Minute Liters of Oxygen 0 L  4 Minute Oxygen Saturation % 91 %     4 Minute Liters of Oxygen 0 L     5 Minute Oxygen Saturation % 92 %     5 Minute Liters of Oxygen 0 L     6 Minute Oxygen Saturation % 94 %     6 Minute Liters of Oxygen 0 L     2 Minute Post Oxygen Saturation % 94 %     2 Minute Post Liters of Oxygen 0 L             Oxygen Initial Assessment:  Oxygen Initial Assessment - 07/20/23 1334       Home Oxygen   Home Oxygen Device None    Sleep Oxygen Prescription None    Home Exercise Oxygen Prescription None    Home Resting Oxygen Prescription None      Initial 6 min Walk   Oxygen Used None      Program Oxygen Prescription    Program Oxygen Prescription None      Intervention   Short Term Goals To learn and understand importance of monitoring SPO2 with pulse oximeter and demonstrate accurate use of the pulse oximeter.;To learn and demonstrate proper pursed lip breathing techniques or other breathing techniques. ;To learn and exhibit compliance with exercise, home and travel O2 prescription;To learn and understand importance of maintaining oxygen saturations>88%;To learn and demonstrate proper use of respiratory medications    Long  Term Goals Exhibits compliance with exercise, home  and travel O2 prescription;Maintenance of O2 saturations>88%;Compliance with respiratory medication;Demonstrates proper use of MDI's;Verbalizes importance of monitoring SPO2 with pulse oximeter and return demonstration;Exhibits proper breathing techniques, such as pursed lip breathing or other method taught during program session             Oxygen Re-Evaluation:   Oxygen Discharge (Final Oxygen Re-Evaluation):   Initial Exercise Prescription:  Initial Exercise Prescription - 07/22/23 1500       Date of Initial Exercise RX and Referring Provider   Date 07/22/23    Referring Provider Peder Bourdon, MD      Oxygen   Maintain Oxygen Saturation 88% or higher      Recumbant Bike   Level 2    RPM 50    Watts 33    Minutes 15    METs 3.28      NuStep   Level 2   T4 &T6   SPM 80    Minutes 15    METs 3.28      T5 Nustep   Level 2    SPM 80    Minutes 15    METs 3.28      Biostep-RELP   Level 2    SPM 50    Minutes 15    METs 3.28      Track   Laps 32    Minutes 15    METs 2.74      Prescription Details   Frequency (times per week) 2    Duration Progress to 30 minutes of continuous aerobic without signs/symptoms of physical distress      Intensity   THRR 40-80% of Max Heartrate 118-148    Ratings of Perceived Exertion 11-13    Perceived Dyspnea 0-4      Progression   Progression Continue to progress  workloads to maintain intensity without signs/symptoms of physical distress.      Resistance Training   Training Prescription Yes    Weight 2 lb  Reps 10-15             Perform Capillary Blood Glucose checks as needed.  Exercise Prescription Changes:   Exercise Prescription Changes     Row Name 07/22/23 1500             Response to Exercise   Blood Pressure (Admit) 120/84       Blood Pressure (Exercise) 132/88       Blood Pressure (Exit) 126/88       Heart Rate (Admit) 92 bpm       Heart Rate (Exercise) 109 bpm       Heart Rate (Exit) 89 bpm       Oxygen Saturation (Admit) 90 %       Oxygen Saturation (Exercise) 90 %       Oxygen Saturation (Exit) 94 %       Rating of Perceived Exertion (Exercise) 11       Perceived Dyspnea (Exercise) 0       Symptoms Nerve pain (same pain as resting)       Comments results       Duration Progress to 30 minutes of  aerobic without signs/symptoms of physical distress       Intensity THRR New         Progression   Progression Continue to progress workloads to maintain intensity without signs/symptoms of physical distress.       Average METs 3.28                Exercise Comments:   Exercise Goals and Review:   Exercise Goals     Row Name 07/22/23 1550             Exercise Goals   Increase Physical Activity Yes       Intervention Provide advice, education, support and counseling about physical activity/exercise needs.;Develop an individualized exercise prescription for aerobic and resistive training based on initial evaluation findings, risk stratification, comorbidities and participant's personal goals.       Expected Outcomes Short Term: Attend rehab on a regular basis to increase amount of physical activity.;Long Term: Add in home exercise to make exercise part of routine and to increase amount of physical activity.;Long Term: Exercising regularly at least 3-5 days a week.       Increase Strength and Stamina  Yes       Intervention Provide advice, education, support and counseling about physical activity/exercise needs.;Develop an individualized exercise prescription for aerobic and resistive training based on initial evaluation findings, risk stratification, comorbidities and participant's personal goals.       Expected Outcomes Short Term: Increase workloads from initial exercise prescription for resistance, speed, and METs.;Short Term: Perform resistance training exercises routinely during rehab and add in resistance training at home;Long Term: Improve cardiorespiratory fitness, muscular endurance and strength as measured by increased METs and functional capacity ( )       Able to understand and use rate of perceived exertion (RPE) scale Yes       Intervention Provide education and explanation on how to use RPE scale       Expected Outcomes Short Term: Able to use RPE daily in rehab to express subjective intensity level;Long Term:  Able to use RPE to guide intensity level when exercising independently       Able to understand and use Dyspnea scale Yes       Intervention Provide education and explanation on how to use Dyspnea scale  Expected Outcomes Short Term: Able to use Dyspnea scale daily in rehab to express subjective sense of shortness of breath during exertion;Long Term: Able to use Dyspnea scale to guide intensity level when exercising independently       Knowledge and understanding of Target Heart Rate Range (THRR) Yes       Intervention Provide education and explanation of THRR including how the numbers were predicted and where they are located for reference       Expected Outcomes Short Term: Able to state/look up THRR;Short Term: Able to use daily as guideline for intensity in rehab;Long Term: Able to use THRR to govern intensity when exercising independently       Able to check pulse independently Yes       Intervention Provide education and demonstration on how to check pulse in  carotid and radial arteries.;Review the importance of being able to check your own pulse for safety during independent exercise       Expected Outcomes Short Term: Able to explain why pulse checking is important during independent exercise;Long Term: Able to check pulse independently and accurately       Understanding of Exercise Prescription Yes       Intervention Provide education, explanation, and written materials on patient's individual exercise prescription       Expected Outcomes Short Term: Able to explain program exercise prescription;Long Term: Able to explain home exercise prescription to exercise independently                Exercise Goals Re-Evaluation :   Discharge Exercise Prescription (Final Exercise Prescription Changes):  Exercise Prescription Changes - 07/22/23 1500       Response to Exercise   Blood Pressure (Admit) 120/84    Blood Pressure (Exercise) 132/88    Blood Pressure (Exit) 126/88    Heart Rate (Admit) 92 bpm    Heart Rate (Exercise) 109 bpm    Heart Rate (Exit) 89 bpm    Oxygen Saturation (Admit) 90 %    Oxygen Saturation (Exercise) 90 %    Oxygen Saturation (Exit) 94 %    Rating of Perceived Exertion (Exercise) 11    Perceived Dyspnea (Exercise) 0    Symptoms Nerve pain (same pain as resting)    Comments results    Duration Progress to 30 minutes of  aerobic without signs/symptoms of physical distress    Intensity THRR New      Progression   Progression Continue to progress workloads to maintain intensity without signs/symptoms of physical distress.    Average METs 3.28             Nutrition:  Target Goals: Understanding of nutrition guidelines, daily intake of sodium 1500mg , cholesterol 200mg , calories 30% from fat and 7% or less from saturated fats, daily to have 5 or more servings of fruits and vegetables.  Education: All About Nutrition: -Group instruction provided by verbal, written material, interactive activities,  discussions, models, and posters to present general guidelines for heart healthy nutrition including fat, fiber, MyPlate, the role of sodium in heart healthy nutrition, utilization of the nutrition label, and utilization of this knowledge for meal planning. Follow up email sent as well. Written material given at graduation.   Biometrics:  Pre Biometrics - 07/22/23 1551       Pre Biometrics   Height 5' 3.2" (1.605 m)    Weight 180 lb 4.8 oz (81.8 kg)    Waist Circumference 40 inches    Hip Circumference 44 inches  Waist to Hip Ratio 0.91 %    BMI (Calculated) 31.75    Single Leg Stand 30 seconds              Nutrition Therapy Plan and Nutrition Goals:  Nutrition Therapy & Goals - 07/22/23 1552       Personal Nutrition Goals   Nutrition Goal Will meet w/ RD on 5/20      Intervention Plan   Intervention Prescribe, educate and counsel regarding individualized specific dietary modifications aiming towards targeted core components such as weight, hypertension, lipid management, diabetes, heart failure and other comorbidities.;Nutrition handout(s) given to patient.    Expected Outcomes Short Term Goal: Understand basic principles of dietary content, such as calories, fat, sodium, cholesterol and nutrients.;Short Term Goal: A plan has been developed with personal nutrition goals set during dietitian appointment.;Long Term Goal: Adherence to prescribed nutrition plan.             Nutrition Assessments:  MEDIFICTS Score Key: >=70 Need to make dietary changes  40-70 Heart Healthy Diet <= 40 Therapeutic Level Cholesterol Diet   Picture Your Plate Scores: <06 Unhealthy dietary pattern with much room for improvement. 41-50 Dietary pattern unlikely to meet recommendations for good health and room for improvement. 51-60 More healthful dietary pattern, with some room for improvement.  >60 Healthy dietary pattern, although there may be some specific behaviors that could be  improved.   Nutrition Goals Re-Evaluation:   Nutrition Goals Discharge (Final Nutrition Goals Re-Evaluation):   Psychosocial: Target Goals: Acknowledge presence or absence of significant depression and/or stress, maximize coping skills, provide positive support system. Participant is able to verbalize types and ability to use techniques and skills needed for reducing stress and depression.   Education: Stress, Anxiety, and Depression - Group verbal and visual presentation to define topics covered.  Reviews how body is impacted by stress, anxiety, and depression.  Also discusses healthy ways to reduce stress and to treat/manage anxiety and depression.  Written material given at graduation.   Education: Sleep Hygiene -Provides group verbal and written instruction about how sleep can affect your health.  Define sleep hygiene, discuss sleep cycles and impact of sleep habits. Review good sleep hygiene tips.    Initial Review & Psychosocial Screening:  Initial Psych Review & Screening - 07/20/23 1339       Initial Review   Current issues with None Identified      Family Dynamics   Good Support System? Yes    Comments Lizanne states that she lives with her son and has a good family support system.She states no mental instability.      Barriers   Psychosocial barriers to participate in program The patient should benefit from training in stress management and relaxation.;There are no identifiable barriers or psychosocial needs.      Screening Interventions   Interventions Encouraged to exercise;To provide support and resources with identified psychosocial needs;Provide feedback about the scores to participant    Expected Outcomes Short Term goal: Utilizing psychosocial counselor, staff and physician to assist with identification of specific Stressors or current issues interfering with healing process. Setting desired goal for each stressor or current issue identified.;Long Term Goal: Stressors  or current issues are controlled or eliminated.;Short Term goal: Identification and review with participant of any Quality of Life or Depression concerns found by scoring the questionnaire.;Long Term goal: The participant improves quality of Life and PHQ9 Scores as seen by post scores and/or verbalization of changes  Quality of Life Scores:  Scores of 19 and below usually indicate a poorer quality of life in these areas.  A difference of  2-3 points is a clinically meaningful difference.  A difference of 2-3 points in the total score of the Quality of Life Index has been associated with significant improvement in overall quality of life, self-image, physical symptoms, and general health in studies assessing change in quality of life.  PHQ-9: Review Flowsheet  More data exists      07/22/2023 12/10/2022 06/17/2021 06/13/2021 06/03/2021  Depression screen PHQ 2/9  Decreased Interest 0 1 1 0 0 0  Down, Depressed, Hopeless 0 1 1 0 0 0  PHQ - 2 Score 0 2 2 0 0 0  Altered sleeping 0 3 3 2  - -  Tired, decreased energy 3 3 3 3  - -  Change in appetite 2 1 2  0 - -  Feeling bad or failure about yourself  0 -- 0 1 - -  Trouble concentrating 0 0 0 1 - -  Moving slowly or fidgety/restless 0 0 1 0 - -  Suicidal thoughts 0 -- 0 0 - -  PHQ-9 Score 5 9 11 7  - -  Difficult doing work/chores - Very difficult Very difficult - - -    Details       Multiple values from one day are sorted in reverse-chronological order        Interpretation of Total Score  Total Score Depression Severity:  1-4 = Minimal depression, 5-9 = Mild depression, 10-14 = Moderate depression, 15-19 = Moderately severe depression, 20-27 = Severe depression   Psychosocial Evaluation and Intervention:  Psychosocial Evaluation - 07/20/23 1341       Psychosocial Evaluation & Interventions   Interventions Encouraged to exercise with the program and follow exercise prescription;Relaxation education;Stress management  education    Comments Rebekka states that she lives with her son and has a good family support system.She states no mental instability.    Expected Outcomes Short: Start [facility] to help with mood. Long: Maintain a healthy mental state    Continue Psychosocial Services  Follow up required by staff             Psychosocial Re-Evaluation:   Psychosocial Discharge (Final Psychosocial Re-Evaluation):   Education: Education Goals: Education classes will be provided on a weekly basis, covering required topics. Participant will state understanding/return demonstration of topics presented.  Learning Barriers/Preferences:  Learning Barriers/Preferences - 07/20/23 1336       Learning Barriers/Preferences   Learning Barriers None    Learning Preferences None             General Pulmonary Education Topics:  Infection Prevention: - Provides verbal and written material to individual with discussion of infection control including proper hand washing and proper equipment cleaning during exercise session. Flowsheet Row Pulmonary Rehab from 07/22/2023 in [facility] Cardiac and Pulmonary Rehab  Date 07/22/23  Educator MB  Instruction Review Code 1- Verbalizes Understanding       Falls Prevention: - Provides verbal and written material to individual with discussion of falls prevention and safety. Flowsheet Row Pulmonary Rehab from 07/22/2023 in [facility] Cardiac and Pulmonary Rehab  Date 07/22/23  Educator MB  Instruction Review Code 1- Verbalizes Understanding       Chronic Lung Disease Review: - Group verbal instruction with posters, models, PowerPoint presentations and videos,  to review new updates, new respiratory medications, new advancements in procedures and treatments. Providing information on websites and "800"  numbers for continued self-education. Includes information about supplement oxygen, available portable oxygen systems, continuous and intermittent flow rates, oxygen safety,  concentrators, and Medicare reimbursement for oxygen. Explanation of Pulmonary Drugs, including class, frequency, complications, importance of spacers, rinsing mouth after steroid MDI's, and proper cleaning methods for nebulizers. Review of basic lung anatomy and physiology related to function, structure, and complications of lung disease. Review of risk factors. Discussion about methods for diagnosing sleep apnea and types of masks and machines for OSA. Includes a review of the use of types of environmental controls: home humidity, furnaces, filters, dust mite/pet prevention, HEPA vacuums. Discussion about weather changes, air quality and the benefits of nasal washing. Instruction on Warning signs, infection symptoms, calling MD promptly, preventive modes, and value of vaccinations. Review of effective airway clearance, coughing and/or vibration techniques. Emphasizing that all should Create an Action Plan. Written material given at graduation.   AED/CPR: - Group verbal and written instruction with the use of models to demonstrate the basic use of the AED with the basic ABC's of resuscitation.    Anatomy and Cardiac Procedures: - Group verbal and visual presentation and models provide information about basic cardiac anatomy and function. Reviews the testing methods done to diagnose heart disease and the outcomes of the test results. Describes the treatment choices: Medical Management, Angioplasty, or Coronary Bypass Surgery for treating various heart conditions including Myocardial Infarction, Angina, Valve Disease, and Cardiac Arrhythmias.  Written material given at graduation.   Medication Safety: - Group verbal and visual instruction to review commonly prescribed medications for heart and lung disease. Reviews the medication, class of the drug, and side effects. Includes the steps to properly store meds and maintain the prescription regimen.  Written material given at  graduation.   Other: -Provides group and verbal instruction on various topics (see comments)   Knowledge Questionnaire Score:    Core Components/Risk Factors/Patient Goals at Admission:  Personal Goals and Risk Factors at Admission - 07/22/23 1552       Core Components/Risk Factors/Patient Goals on Admission    Weight Management Yes;Weight Loss    Intervention Weight Management: Develop a combined nutrition and exercise program designed to reach desired caloric intake, while maintaining appropriate intake of nutrient and fiber, sodium and fats, and appropriate energy expenditure required for the weight goal.;Weight Management: Provide education and appropriate resources to help participant work on and attain dietary goals.;Weight Management/Obesity: Establish reasonable short term and long term weight goals.    Admit Weight 180 lb 4.8 oz (81.8 kg)    Goal Weight: Short Term 170 lb (77.1 kg)    Goal Weight: Long Term 160 lb (72.6 kg)    Expected Outcomes Short Term: Continue to assess and modify interventions until short term weight is achieved;Weight Loss: Understanding of general recommendations for a balanced deficit meal plan, which promotes 1-2 lb weight loss per week and includes a negative energy balance of [phone] kcal/d;Understanding recommendations for meals to include 15-35% energy as protein, 25-35% energy from fat, 35-60% energy from carbohydrates, less than 200mg  of dietary cholesterol, 20-35 gm of total fiber daily;Understanding of distribution of calorie intake throughout the day with the consumption of 4-5 meals/snacks;Long Term: Adherence to nutrition and physical activity/exercise program aimed toward attainment of established weight goal    Improve shortness of breath with ADL's Yes    Intervention Provide education, individualized exercise plan and daily activity instruction to help decrease symptoms of SOB with activities of daily living.    Expected Outcomes Short Term:  Improve cardiorespiratory fitness to achieve a reduction of symptoms when performing ADLs;Long Term: Be able to perform more ADLs without symptoms or delay the onset of symptoms    Hypertension Yes    Intervention Provide education on lifestyle modifcations including regular physical activity/exercise, weight management, moderate sodium restriction and increased consumption of fresh fruit, vegetables, and low fat dairy, alcohol moderation, and smoking cessation.;Monitor prescription use compliance.    Expected Outcomes Short Term: Continued assessment and intervention until BP is < 140/73mm HG in hypertensive participants. < 130/62mm HG in hypertensive participants with diabetes, heart failure or chronic kidney disease.;Long Term: Maintenance of blood pressure at goal levels.             Education:Diabetes - Individual verbal and written instruction to review signs/symptoms of diabetes, desired ranges of glucose level fasting, after meals and with exercise. Acknowledge that pre and post exercise glucose checks will be done for 3 sessions at entry of program.   Know Your Numbers and Heart Failure: - Group verbal and visual instruction to discuss disease risk factors for cardiac and pulmonary disease and treatment options.  Reviews associated critical values for Overweight/Obesity, Hypertension, Cholesterol, and Diabetes.  Discusses basics of heart failure: signs/symptoms and treatments.  Introduces Heart Failure Zone chart for action plan for heart failure.  Written material given at graduation.   Core Components/Risk Factors/Patient Goals Review:    Core Components/Risk Factors/Patient Goals at Discharge (Final Review):    ITP Comments:  ITP Comments     Row Name 07/20/23 1338 07/22/23 1541         ITP Comments Virtual Visit completed. Patient informed on EP and RD appointment and 6 Minute walk test. Patient also informed of patient health questionnaires on My Chart. Patient  Verbalizes understanding. Visit diagnosis can be found in [facility] 06/05/[facility]. Completed and gym orientation for respiratory care services. Initial ITP created and sent for review to Dr. Faud Aleskerov, Medical Director.               Comments: Initial ITP

## 2023-07-22 NOTE — Patient Instructions (Signed)
 Patient Instructions  Patient Details  Name: Tonya Odonnell MRN: [id number] Date of Birth: [birth year] Referring Provider:  Darlis Eisenmenger, MD  Below are your personal goals for exercise, nutrition, and risk factors. Our goal is to help you stay on track towards obtaining and maintaining these goals. We will be discussing your progress on these goals with you throughout the program.  Initial Exercise Prescription:  Initial Exercise Prescription - 07/22/23 1500       Date of Initial Exercise RX and Referring Provider   Date 07/22/23    Referring Provider Peder Bourdon, MD      Oxygen   Maintain Oxygen Saturation 88% or higher      Recumbant Bike   Level 2    RPM 50    Watts 33    Minutes 15    METs 3.28      NuStep   Level 2   T4 &T6   SPM 80    Minutes 15    METs 3.28      T5 Nustep   Level 2    SPM 80    Minutes 15    METs 3.28      Biostep-RELP   Level 2    SPM 50    Minutes 15    METs 3.28      Track   Laps 32    Minutes 15    METs 2.74      Prescription Details   Frequency (times per week) 2    Duration Progress to 30 minutes of continuous aerobic without signs/symptoms of physical distress      Intensity   THRR 40-80% of Max Heartrate 118-148    Ratings of Perceived Exertion 11-13    Perceived Dyspnea 0-4      Progression   Progression Continue to progress workloads to maintain intensity without signs/symptoms of physical distress.      Resistance Training   Training Prescription Yes    Weight 2 lb    Reps 10-15             Exercise Goals: Frequency: Be able to perform aerobic exercise two to three times per week in program working toward 2-5 days per week of home exercise.  Intensity: Work with a perceived exertion of 11 (fairly light) - 15 (hard) while following your exercise prescription.  We will make changes to your prescription with you as you progress through the program.   Duration: Be able to do 30 to 45 minutes of continuous  aerobic exercise in addition to a 5 minute warm-up and a 5 minute cool-down routine.   Nutrition Goals: Your personal nutrition goals will be established when you do your nutrition analysis with the dietician.  The following are general nutrition guidelines to follow: Cholesterol < 200mg /day Sodium < 1500mg /day Fiber: Women over 50 yrs - 21 grams per day  Personal Goals:  Personal Goals and Risk Factors at Admission - 07/22/23 1552       Core Components/Risk Factors/Patient Goals on Admission    Weight Management Yes;Weight Loss    Intervention Weight Management: Develop a combined nutrition and exercise program designed to reach desired caloric intake, while maintaining appropriate intake of nutrient and fiber, sodium and fats, and appropriate energy expenditure required for the weight goal.;Weight Management: Provide education and appropriate resources to help participant work on and attain dietary goals.;Weight Management/Obesity: Establish reasonable short term and long term weight goals.    Admit Weight 180 lb 4.8 oz (81.8  kg)    Goal Weight: Short Term 170 lb (77.1 kg)    Goal Weight: Long Term 160 lb (72.6 kg)    Expected Outcomes Short Term: Continue to assess and modify interventions until short term weight is achieved;Weight Loss: Understanding of general recommendations for a balanced deficit meal plan, which promotes 1-2 lb weight loss per week and includes a negative energy balance of [phone] kcal/d;Understanding recommendations for meals to include 15-35% energy as protein, 25-35% energy from fat, 35-60% energy from carbohydrates, less than 200mg  of dietary cholesterol, 20-35 gm of total fiber daily;Understanding of distribution of calorie intake throughout the day with the consumption of 4-5 meals/snacks;Long Term: Adherence to nutrition and physical activity/exercise program aimed toward attainment of established weight goal    Improve shortness of breath with ADL's Yes     Intervention Provide education, individualized exercise plan and daily activity instruction to help decrease symptoms of SOB with activities of daily living.    Expected Outcomes Short Term: Improve cardiorespiratory fitness to achieve a reduction of symptoms when performing ADLs;Long Term: Be able to perform more ADLs without symptoms or delay the onset of symptoms    Hypertension Yes    Intervention Provide education on lifestyle modifcations including regular physical activity/exercise, weight management, moderate sodium restriction and increased consumption of fresh fruit, vegetables, and low fat dairy, alcohol moderation, and smoking cessation.;Monitor prescription use compliance.    Expected Outcomes Short Term: Continued assessment and intervention until BP is < 140/92mm HG in hypertensive participants. < 130/9mm HG in hypertensive participants with diabetes, heart failure or chronic kidney disease.;Long Term: Maintenance of blood pressure at goal levels.             Tobacco Use Initial Evaluation: Social History   Tobacco Use  Smoking Status Never  Smokeless Tobacco Never    Exercise Goals and Review:  Exercise Goals     Row Name 07/22/23 1550             Exercise Goals   Increase Physical Activity Yes       Intervention Provide advice, education, support and counseling about physical activity/exercise needs.;Develop an individualized exercise prescription for aerobic and resistive training based on initial evaluation findings, risk stratification, comorbidities and participant's personal goals.       Expected Outcomes Short Term: Attend rehab on a regular basis to increase amount of physical activity.;Long Term: Add in home exercise to make exercise part of routine and to increase amount of physical activity.;Long Term: Exercising regularly at least 3-5 days a week.       Increase Strength and Stamina Yes       Intervention Provide advice, education, support and counseling  about physical activity/exercise needs.;Develop an individualized exercise prescription for aerobic and resistive training based on initial evaluation findings, risk stratification, comorbidities and participant's personal goals.       Expected Outcomes Short Term: Increase workloads from initial exercise prescription for resistance, speed, and METs.;Short Term: Perform resistance training exercises routinely during rehab and add in resistance training at home;Long Term: Improve cardiorespiratory fitness, muscular endurance and strength as measured by increased METs and functional capacity ( )       Able to understand and use rate of perceived exertion (RPE) scale Yes       Intervention Provide education and explanation on how to use RPE scale       Expected Outcomes Short Term: Able to use RPE daily in rehab to express subjective intensity level;Long Term:  Able to  use RPE to guide intensity level when exercising independently       Able to understand and use Dyspnea scale Yes       Intervention Provide education and explanation on how to use Dyspnea scale       Expected Outcomes Short Term: Able to use Dyspnea scale daily in rehab to express subjective sense of shortness of breath during exertion;Long Term: Able to use Dyspnea scale to guide intensity level when exercising independently       Knowledge and understanding of Target Heart Rate Range (THRR) Yes       Intervention Provide education and explanation of THRR including how the numbers were predicted and where they are located for reference       Expected Outcomes Short Term: Able to state/look up THRR;Short Term: Able to use daily as guideline for intensity in rehab;Long Term: Able to use THRR to govern intensity when exercising independently       Able to check pulse independently Yes       Intervention Provide education and demonstration on how to check pulse in carotid and radial arteries.;Review the importance of being able to check your  own pulse for safety during independent exercise       Expected Outcomes Short Term: Able to explain why pulse checking is important during independent exercise;Long Term: Able to check pulse independently and accurately       Understanding of Exercise Prescription Yes       Intervention Provide education, explanation, and written materials on patient's individual exercise prescription       Expected Outcomes Short Term: Able to explain program exercise prescription;Long Term: Able to explain home exercise prescription to exercise independently

## 2023-07-23 ENCOUNTER — Inpatient Hospital Stay (HOSPITAL_COMMUNITY): Admission: RE | Admit: 2023-07-23 | Source: Ambulatory Visit | Admitting: Cardiology

## 2023-07-23 ENCOUNTER — Ambulatory Visit (HOSPITAL_COMMUNITY)

## 2023-07-24 ENCOUNTER — Other Ambulatory Visit: Payer: Self-pay

## 2023-07-28 ENCOUNTER — Encounter: Admitting: *Deleted

## 2023-07-28 ENCOUNTER — Encounter

## 2023-07-28 DIAGNOSIS — I272 Pulmonary hypertension, unspecified: Secondary | ICD-10-CM

## 2023-07-28 DIAGNOSIS — Z7689 Persons encountering health services in other specified circumstances: Secondary | ICD-10-CM | POA: Diagnosis not present

## 2023-07-28 NOTE — Progress Notes (Signed)
 Daily Session Note  Patient Details  Name: Tonya Odonnell MRN: [id number] Date of Birth: [birth year] Referring Provider:   Flowsheet Row Pulmonary Rehab from 07/22/2023 in [facility] Cardiac and Pulmonary Rehab  Referring Provider Peder Bourdon, MD       Encounter Date: 07/28/2023  Check In:  Session Check In - 07/28/23 1116       Check-In   Supervising physician immediately available to respond to emergencies See telemetry face sheet for immediately available ER MD    Location [facility]-Cardiac & Pulmonary Rehab    Staff Present Maud Sorenson, RN, BSN, CCRP;Maxon Conetta BS, Exercise Physiologist;Noah Tickle, BS, Exercise Physiologist;Meredith Manson Seitz RN,BSN    Virtual Visit No    Medication changes reported     No    Fall or balance concerns reported    No    Warm-up and Cool-down Performed on first and last piece of equipment    Resistance Training Performed Yes    VAD Patient? No    PAD/SET Patient? No      Pain Assessment   Currently in Pain? No/denies                Social History   Tobacco Use  Smoking Status Never  Smokeless Tobacco Never    Goals Met:  Proper associated with RPD/PD & O2 Sat Exercise tolerated well Personal goals reviewed No report of concerns or symptoms today  Goals Unmet:  Not Applicable  Comments: First full day of exercise!  Patient was oriented to gym and equipment including functions, settings, policies, and procedures.  Patient's individual exercise prescription and treatment plan were reviewed.  All starting workloads were established based on the results of the 6 minute walk test done at initial orientation visit.  The plan for exercise progression was also introduced and progression will be customized based on patient's performance and goals. Pt able to follow exercise prescription today without complaint.  Will continue to monitor for progression.    Dr. Firman Hughes is Medical Director for [facility] Cardiac Rehabilitation.  Dr. Fuad  Aleskerov is Medical Director for [facility] Pulmonary Rehabilitation.

## 2023-07-28 NOTE — Progress Notes (Signed)
 Assessment start time: 10:24 AM  Digestive issues/concerns: no known food allergies   24-hours Recall: B: cream of wheat with blueberries OR oatmeal L: soup - chicken and bean D: chicken pasta  Education r/t nutrition plan Patient on selexipag  and reports it has ruined her appetite and when she does eat she feels bloated quickly experiences diarrhea. She sometimes gets used to the medication but then the does is increased and then she struggles again. Spoke with her today about prioritizing quality over quantity. To make sure the little food she can eat and tolerate helps her meet her nutritional goals. Provided handout with several small meal and snack ideas focusing on balance of complex carbs and protein. Educated on types of fats, sources and how to add more calories to less quantity of food.      Goal 1: Eat 3 times per day, small frequent meals or nutrient dense snacks  Goal 2: Eat 15-30gProtein and 30-60gCarbs at each meal.  End time 10:55 AM

## 2023-07-30 ENCOUNTER — Encounter (HOSPITAL_COMMUNITY): Payer: Self-pay

## 2023-07-30 ENCOUNTER — Encounter: Admitting: *Deleted

## 2023-07-30 ENCOUNTER — Other Ambulatory Visit (HOSPITAL_COMMUNITY): Payer: Self-pay

## 2023-07-30 DIAGNOSIS — Z7689 Persons encountering health services in other specified circumstances: Secondary | ICD-10-CM | POA: Diagnosis not present

## 2023-07-30 DIAGNOSIS — I272 Pulmonary hypertension, unspecified: Secondary | ICD-10-CM

## 2023-07-30 NOTE — Progress Notes (Signed)
 Daily Session Note  Patient Details  Name: Shamiracle Gorden MRN: [id number] Date of Birth: 1966/01/06 Referring Provider:   Flowsheet Row Pulmonary Rehab from 07/22/2023 in [facility] Cardiac and Pulmonary Rehab  Referring Provider Peder Bourdon, MD       Encounter Date: 07/30/2023  Check In:  Session Check In - 07/30/23 1138       Check-In   Supervising physician immediately available to respond to emergencies See telemetry face sheet for immediately available ER MD    Location [facility]-Cardiac & Pulmonary Rehab    Staff Present Maud Sorenson, RN, BSN, CCRP;Margaret Best, MS, Exercise Physiologist;Maxon Beauford Bounds, Exercise Physiologist;Meredith Manson Seitz RN,BSN;Joseph Hood RCP,RRT,BSRT    Virtual Visit No    Medication changes reported     No    Fall or balance concerns reported    No    Warm-up and Cool-down Performed on first and last piece of equipment    Resistance Training Performed Yes    VAD Patient? No    PAD/SET Patient? No      Pain Assessment   Currently in Pain? No/denies                Social History   Tobacco Use  Smoking Status Never  Smokeless Tobacco Never    Goals Met:  Proper associated with RPD/PD & O2 Sat Independence with exercise equipment Exercise tolerated well No report of concerns or symptoms today  Goals Unmet:  Not Applicable  Comments: Pt able to follow exercise prescription today without complaint.  Will continue to monitor for progression.    Dr. Firman Hughes is Medical Director for [facility] Cardiac Rehabilitation.  Dr. Fuad Aleskerov is Medical Director for [facility] Pulmonary Rehabilitation.

## 2023-07-31 DIAGNOSIS — Z7689 Persons encountering health services in other specified circumstances: Secondary | ICD-10-CM | POA: Diagnosis not present

## 2023-08-02 DIAGNOSIS — Z7689 Persons encountering health services in other specified circumstances: Secondary | ICD-10-CM | POA: Diagnosis not present

## 2023-08-04 ENCOUNTER — Other Ambulatory Visit (HOSPITAL_COMMUNITY): Payer: Self-pay

## 2023-08-04 ENCOUNTER — Other Ambulatory Visit: Payer: Self-pay

## 2023-08-04 DIAGNOSIS — Z1231 Encounter for screening mammogram for malignant neoplasm of breast: Secondary | ICD-10-CM | POA: Diagnosis not present

## 2023-08-04 DIAGNOSIS — Z7689 Persons encountering health services in other specified circumstances: Secondary | ICD-10-CM | POA: Diagnosis not present

## 2023-08-04 DIAGNOSIS — R92333 Mammographic heterogeneous density, bilateral breasts: Secondary | ICD-10-CM | POA: Diagnosis not present

## 2023-08-04 NOTE — Progress Notes (Signed)
 Specialty Pharmacy Ongoing Clinical Assessment Note  Tonya Odonnell is a 58 y.o. female who is being followed by the specialty pharmacy service for RxSp Cardiology   Patient's specialty medication(s) reviewed today: No medications found.   Missed doses in the last 4 weeks: 0   Patient/Caregiver did not have any additional questions or concerns.   Therapeutic benefit summary: Patient is achieving benefit   Adverse events/side effects summary: No adverse events/side effects   Patient's therapy is appropriate to: Continue    Goals Addressed             This Visit's Progress    Reduce signs and symptoms   On track    Patient is on track. Patient will maintain adherence and be monitored by provider to determine if a change in treatment plan is warranted         Follow up: 6 months  Malachi Screws Specialty Pharmacist

## 2023-08-04 NOTE — Progress Notes (Signed)
 Specialty Pharmacy Refill Coordination Note  Tonya Odonnell is a 58 y.o. female contacted today regarding refills of specialty medication(s) No medications found.   Patient requested Delivery   Delivery date: 08/11/23   Verified address: [address] [place] [phone]   Medication will be filled on 08/10/23.

## 2023-08-06 ENCOUNTER — Encounter: Admitting: *Deleted

## 2023-08-06 DIAGNOSIS — I272 Pulmonary hypertension, unspecified: Secondary | ICD-10-CM | POA: Diagnosis not present

## 2023-08-06 DIAGNOSIS — Z7689 Persons encountering health services in other specified circumstances: Secondary | ICD-10-CM | POA: Diagnosis not present

## 2023-08-06 NOTE — Progress Notes (Signed)
 Daily Session Note  Patient Details  Name: Tonya Odonnell MRN: [id number] Date of Birth: Apr 24, 1965 Referring Provider:   Flowsheet Row Pulmonary Rehab from 07/22/2023 in [facility] Cardiac and Pulmonary Rehab  Referring Provider Peder Bourdon, MD       Encounter Date: 08/06/2023  Check In:  Session Check In - 08/06/23 1114       Check-In   Supervising physician immediately available to respond to emergencies See telemetry face sheet for immediately available ER MD    Location [facility]-Cardiac & Pulmonary Rehab    Staff Present Maud Sorenson, RN, BSN, CCRP;Joseph Hood RCP,RRT,BSRT;Noah Tickle, [place], Exercise Physiologist;Meredith Manson Seitz RN,BSN    Virtual Visit No    Medication changes reported     No    Fall or balance concerns reported    No    Warm-up and Cool-down Performed on first and last piece of equipment    Resistance Training Performed Yes    VAD Patient? No    PAD/SET Patient? No      Pain Assessment   Currently in Pain? No/denies                Social History   Tobacco Use  Smoking Status Never  Smokeless Tobacco Never    Goals Met:  Proper associated with RPD/PD & O2 Sat Independence with exercise equipment Exercise tolerated well No report of concerns or symptoms today  Goals Unmet:  Not Applicable  Comments: Pt able to follow exercise prescription today without complaint.  Will continue to monitor for progression.    Dr. Firman Hughes is Medical Director for [facility] Cardiac Rehabilitation.  Dr. Fuad Aleskerov is Medical Director for [facility] Pulmonary Rehabilitation.

## 2023-08-10 ENCOUNTER — Other Ambulatory Visit: Payer: Self-pay

## 2023-08-11 ENCOUNTER — Encounter: Attending: Cardiology

## 2023-08-11 DIAGNOSIS — I272 Pulmonary hypertension, unspecified: Secondary | ICD-10-CM | POA: Insufficient documentation

## 2023-08-11 DIAGNOSIS — Z7689 Persons encountering health services in other specified circumstances: Secondary | ICD-10-CM | POA: Diagnosis not present

## 2023-08-11 NOTE — Progress Notes (Signed)
 Daily Session Note  Patient Details  Name: Tonya Odonnell MRN: [id number] Date of Birth: 1965/12/09 Referring Provider:   Flowsheet Row Pulmonary Rehab from 07/22/2023 in [facility] Cardiac and Pulmonary Rehab  Referring Provider Peder Bourdon, MD       Encounter Date: 08/11/2023  Check In:  Session Check In - 08/11/23 1110       Check-In   Supervising physician immediately available to respond to emergencies See telemetry face sheet for immediately available ER MD    Location [facility]-Cardiac & Pulmonary Rehab    Staff Present Josph Nimrod RN,BSN,MPA;Maxon Conetta BS, Exercise Physiologist;Susanne Bice, RN, BSN, CCRP;Jason Martina Sledge RDN,LDN    Virtual Visit No    Medication changes reported     No    Fall or balance concerns reported    No    Warm-up and Cool-down Performed on first and last piece of equipment    Resistance Training Performed Yes    VAD Patient? No    PAD/SET Patient? No      Pain Assessment   Currently in Pain? No/denies                Social History   Tobacco Use  Smoking Status Never  Smokeless Tobacco Never    Goals Met:  Independence with exercise equipment Exercise tolerated well No report of concerns or symptoms today Strength training completed today  Goals Unmet:  Not Applicable  Comments: Pt able to follow exercise prescription today without complaint.  Will continue to monitor for progression.    Dr. Firman Hughes is Medical Director for [facility] Cardiac Rehabilitation.  Dr. Fuad Aleskerov is Medical Director for [facility] Pulmonary Rehabilitation.

## 2023-08-12 ENCOUNTER — Encounter: Payer: Self-pay | Admitting: *Deleted

## 2023-08-12 DIAGNOSIS — I272 Pulmonary hypertension, unspecified: Secondary | ICD-10-CM

## 2023-08-12 NOTE — Progress Notes (Signed)
 Pulmonary Individual Treatment Plan  Patient Details  Name: Tonya Odonnell MRN: [id number] Date of Birth: [birth year] Referring Provider:   Flowsheet Row Pulmonary Rehab from 07/22/2023 in [facility] Pulmonary Rehab  Referring Provider Peder Bourdon, MD       Initial Encounter Date:  Flowsheet Row Pulmonary Rehab from 07/22/2023 in [facility] Cardiac and Pulmonary Rehab  Date 07/22/23       Visit Diagnosis: Pulmonary hypertension (HCC)  Patient's Home Medications on Admission:  Current Outpatient Medications:    ambrisentan  (LETAIRIS ) 10 MG tablet, Take 1 tablet (10 mg total) by mouth daily., Disp: 30 tablet, Rfl: 11   Azelaic Acid  15 % gel, Apply to face every morning, Disp: 50 g, Rfl: 5   bismuth subsalicylate (PEPTO BISMOL) 262 MG/15ML suspension, Take 30 mLs by mouth every 6 (six) hours as needed for diarrhea or loose stools., Disp: , Rfl:    clobetasol  cream (TEMOVATE ) 0.05 %, Apply 1 Application topically as needed., Disp: 30 g, Rfl: 4   cromolyn  (OPTICROM ) 4 % ophthalmic solution, Place 1 drop into both eyes 4 (four) times daily. (Patient taking differently: Place 1 drop into both eyes daily as needed (Dry eye).), Disp: 10 mL, Rfl: 4   ELIDEL  1 % cream, Apply to red scaly areas on the face BID for seb derm, Disp: 30 g, Rfl: 4   fluticasone  (FLONASE ) 50 MCG/ACT nasal spray, Place 2 sprays into both nostrils daily as needed for allergies or rhinitis., Disp: , Rfl:    furosemide  (LASIX ) 40 MG tablet, Take 1 tablet (40 mg total) by mouth daily., Disp: 90 tablet, Rfl: 1   hydrALAZINE  (APRESOLINE ) 25 MG tablet, Take 1 tablet (25 mg total) by mouth 2 (two) times daily., Disp: 180 tablet, Rfl: 1   Ivermectin  (SOOLANTRA ) 1 % CREA, Apply 1 Application topically at bedtime. (Patient not taking: Reported on 06/30/2023), Disp: 45 g, Rfl: 5   levothyroxine  (SYNTHROID ) 112 MCG tablet, Take 1 pill everyday except Sunday take half a pill, Disp: 90 tablet, Rfl: 1   omeprazole  (PRILOSEC) 40 MG  capsule, Take 40 mg by mouth daily as needed (Heartburn)., Disp: , Rfl:    potassium chloride  SA (KLOR-CON  M) 20 MEQ tablet, Take 1 tablet (20 mEq total) by mouth daily., Disp: 90 tablet, Rfl: 1   Selexipag  800 MCG TABS, Take 1 tablet (800 mcg total) by mouth 2 (two) times daily. (Patient taking differently: Take 800 mcg by mouth 2 (two) times daily. Uptravi ), Disp: 60 tablet, Rfl: 11   tadalafil , PAH, (ADCIRCA ) 20 MG tablet, Take 2 tablets (40 mg total) by mouth daily. NDC's covered by plan: 13086578469, [id number], [id number], Disp: , Rfl:    valACYclovir  (VALTREX ) 1000 MG tablet, Take 1,000 mg by mouth daily as needed (Cold Sores)., Disp: , Rfl:   Past Medical History: Past Medical History:  Diagnosis Date   Anemia    "USE TO HAVE IT,NOT NOW,2015,2016   Arthritis    HIPS,KNEES,HANDS   Breast cancer in female (HCC)    Cancer (HCC)    LEFT   Hyperlipidemia    Hypertension    Low iron    Neuromuscular disorder (HCC)    MUSCLE PAIN,FIBROMYALGIA   Osteopenia    Pulmonary HTN (HCC)    PVC (premature ventricular contraction)    Thyroid  disease     Tobacco Use: Social History   Tobacco Use  Smoking Status Never  Smokeless Tobacco Never    Labs: Review Flowsheet  More data exists  Latest Ref Rng & Units 12/10/2022 12/11/2022 12/12/2022 12/22/2022 07/02/2023  Labs for [facility] Cardiac and Pulmonary Rehab  Cholestrol 0 - 200 mg/dL - 161  - - -  LDL (calc) 0 - 99 mg/dL - 096  - - -  HDL-C >04 mg/dL - 65  - - -  Trlycerides <150 mg/dL - 78  - - -  Hemoglobin A1c 4.8 - 5.6 % 5.7  - - 5.7  -  PH, Arterial 7.35 - 7.45 - - 7.399  - -  PCO2 arterial 32 - 48 mmHg - - 35.2  - -  Bicarbonate 20.0 - 28.0 mmol/L - - 21.8  24.0  - 23.9  23.5   TCO2 22 - 32 mmol/L - - 23  25  - 25  25   Acid-base deficit 0.0 - 2.0 mmol/L - - 2.0  1.0  - 1.0  2.0   O2 Saturation % - - 94  66  - 71  71     Details       Multiple values from one day are sorted in reverse-chronological order           Pulmonary Assessment Scores:  Pulmonary Assessment Scores     Row Name 07/22/23 1551         mMRC Score   mMRC Score 4              UCSD: Self-administered rating of dyspnea associated with activities of daily living (ADLs) 6-point scale (0 = "not at all" to 5 = "maximal or unable to do because of breathlessness")  Scoring Scores range from 0 to 120.  Minimally important difference is 5 units  CAT: CAT can identify the health impairment of COPD patients and is better correlated with disease progression.  CAT has a scoring range of zero to 40. The CAT score is classified into four groups of low (less than 10), medium (10 - 20), high (21-30) and very high (31-40) based on the impact level of disease on health status. A CAT score over 10 suggests significant symptoms.  A worsening CAT score could be explained by an exacerbation, poor medication adherence, poor inhaler technique, or progression of COPD or comorbid conditions.  CAT MCID is 2 points  mMRC: mMRC (Modified Medical Research Council) Dyspnea Scale is used to assess the degree of baseline functional disability in patients of respiratory disease due to dyspnea. No minimal important difference is established. A decrease in score of 1 point or greater is considered a positive change.   Pulmonary Function Assessment:  Pulmonary Function Assessment - 07/20/23 1335       Breath   Shortness of Breath Yes;Limiting activity             Exercise Target Goals: Exercise Program Goal: Individual exercise prescription set using results from initial 6 min walk test and THRR while considering  patient's activity barriers and safety.   Exercise Prescription Goal: Initial exercise prescription builds to 30-45 minutes a day of aerobic activity, 2-3 days per week.  Home exercise guidelines will be given to patient during program as part of exercise prescription that the participant will acknowledge.  Education: Aerobic  Exercise: - Group verbal and visual presentation on the components of exercise prescription. Introduces F.I.T.T principle from ACSM for exercise prescriptions.  Reviews F.I.T.T. principles of aerobic exercise including progression. Written material given at graduation.   Education: Resistance Exercise: - Group verbal and visual presentation on the components of exercise prescription. Introduces F.I.T.T principle  from ACSM for exercise prescriptions  Reviews F.I.T.T. principles of resistance exercise including progression. Written material given at graduation.    Education: Exercise & Equipment Safety: - Individual verbal instruction and demonstration of equipment use and safety with use of the equipment. Flowsheet Row Pulmonary Rehab from 07/22/2023 in [facility] Cardiac and Pulmonary Rehab  Date 07/22/23  Educator MB  Instruction Review Code 1- Verbalizes Understanding       Education: Exercise Physiology & General Exercise Guidelines: - Group verbal and written instruction with models to review the exercise physiology of the cardiovascular system and associated critical values. Provides general exercise guidelines with specific guidelines to those with heart or lung disease.    Education: Flexibility, Balance, Mind/Body Relaxation: - Group verbal and visual presentation with interactive activity on the components of exercise prescription. Introduces F.I.T.T principle from ACSM for exercise prescriptions. Reviews F.I.T.T. principles of flexibility and balance exercise training including progression. Also discusses the mind body connection.  Reviews various relaxation techniques to help reduce and manage stress (i.e. Deep breathing, progressive muscle relaxation, and visualization). Balance handout provided to take home. Written material given at graduation.   Activity Barriers & Risk Stratification:  Activity Barriers & Cardiac Risk Stratification - 07/22/23 1544       Activity Barriers &  Cardiac Risk Stratification   Activity Barriers Fibromyalgia;Back Problems;Shortness of Breath;Other (comment)    Comments Pain in whole body (the more she moves as the day goes on, the more pain there is), lymphodema             6 Minute Walk:  6 Minute Walk     Row Name 07/22/23 1541         6 Minute Walk   Phase Initial     Distance 1225 feet     Walk Time 6 minutes     # of Rest Breaks 0     MPH 2.32     METS 3.28     RPE 11     Perceived Dyspnea  0     VO2 Peak 11.48     Symptoms Yes (comment)     Comments Nerve pain (same pain as resting)     Resting HR 92 bpm     Resting BP 120/84     Resting Oxygen Saturation  90 %     Exercise Oxygen Saturation  during 6 min walk 90 %     Max Ex. HR 109 bpm     Max Ex. BP 132/88     2 Minute Post BP 126/88       Interval HR   1 Minute HR 100     2 Minute HR 107     3 Minute HR 109     4 Minute HR 108     5 Minute HR 108     6 Minute HR 108     2 Minute Post HR 89     Interval Heart Rate? Yes       Interval Oxygen   Interval Oxygen? Yes     Baseline Oxygen Saturation % 90 %     1 Minute Oxygen Saturation % 90 %     1 Minute Liters of Oxygen 0 L     2 Minute Oxygen Saturation % 90 %     2 Minute Liters of Oxygen 0 L     3 Minute Oxygen Saturation % 91 %     3 Minute Liters of Oxygen 0 L  4 Minute Oxygen Saturation % 91 %     4 Minute Liters of Oxygen 0 L     5 Minute Oxygen Saturation % 92 %     5 Minute Liters of Oxygen 0 L     6 Minute Oxygen Saturation % 94 %     6 Minute Liters of Oxygen 0 L     2 Minute Post Oxygen Saturation % 94 %     2 Minute Post Liters of Oxygen 0 L             Oxygen Initial Assessment:  Oxygen Initial Assessment - 07/20/23 1334       Home Oxygen   Home Oxygen Device None    Sleep Oxygen Prescription None    Home Exercise Oxygen Prescription None    Home Resting Oxygen Prescription None      Initial 6 min Walk   Oxygen Used None      Program Oxygen Prescription    Program Oxygen Prescription None      Intervention   Short Term Goals To learn and understand importance of monitoring SPO2 with pulse oximeter and demonstrate accurate use of the pulse oximeter.;To learn and demonstrate proper pursed lip breathing techniques or other breathing techniques. ;To learn and exhibit compliance with exercise, home and travel O2 prescription;To learn and understand importance of maintaining oxygen saturations>88%;To learn and demonstrate proper use of respiratory medications    Long  Term Goals Exhibits compliance with exercise, home  and travel O2 prescription;Maintenance of O2 saturations>88%;Compliance with respiratory medication;Demonstrates proper use of MDI's;Verbalizes importance of monitoring SPO2 with pulse oximeter and return demonstration;Exhibits proper breathing techniques, such as pursed lip breathing or other method taught during program session             Oxygen Re-Evaluation:  Oxygen Re-Evaluation     Row Name 07/28/23 1117             Program Oxygen Prescription   Program Oxygen Prescription None         Home Oxygen   Home Oxygen Device None       Sleep Oxygen Prescription None       Home Exercise Oxygen Prescription None       Home Resting Oxygen Prescription None       Compliance with Home Oxygen Use Yes         Goals/Expected Outcomes   Short Term Goals To learn and demonstrate proper pursed lip breathing techniques or other breathing techniques.        Long  Term Goals Exhibits proper breathing techniques, such as pursed lip breathing or other method taught during program session       Comments Reviewed PLB technique with pt.  Talked about how it works and it's importance in maintaining their exercise saturations.       Goals/Expected Outcomes Short: Become more profiecient at using PLB. Long: Become independent at using PLB.                Oxygen Discharge (Final Oxygen Re-Evaluation):  Oxygen Re-Evaluation - 07/28/23 1117        Program Oxygen Prescription   Program Oxygen Prescription None      Home Oxygen   Home Oxygen Device None    Sleep Oxygen Prescription None    Home Exercise Oxygen Prescription None    Home Resting Oxygen Prescription None    Compliance with Home Oxygen Use Yes      Goals/Expected Outcomes  Short Term Goals To learn and demonstrate proper pursed lip breathing techniques or other breathing techniques.     Long  Term Goals Exhibits proper breathing techniques, such as pursed lip breathing or other method taught during program session    Comments Reviewed PLB technique with pt.  Talked about how it works and it's importance in maintaining their exercise saturations.    Goals/Expected Outcomes Short: Become more profiecient at using PLB. Long: Become independent at using PLB.             Initial Exercise Prescription:  Initial Exercise Prescription - 07/22/23 1500       Date of Initial Exercise RX and Referring Provider   Date 07/22/23    Referring Provider Peder Bourdon, MD      Oxygen   Maintain Oxygen Saturation 88% or higher      Recumbant Bike   Level 2    RPM 50    Watts 33    Minutes 15    METs 3.28      NuStep   Level 2   T4 &T6   SPM 80    Minutes 15    METs 3.28      T5 Nustep   Level 2    SPM 80    Minutes 15    METs 3.28      Biostep-RELP   Level 2    SPM 50    Minutes 15    METs 3.28      Track   Laps 32    Minutes 15    METs 2.74      Prescription Details   Frequency (times per week) 2    Duration Progress to 30 minutes of continuous aerobic without signs/symptoms of physical distress      Intensity   THRR 40-80% of Max Heartrate 118-148    Ratings of Perceived Exertion 11-13    Perceived Dyspnea 0-4      Progression   Progression Continue to progress workloads to maintain intensity without signs/symptoms of physical distress.      Resistance Training   Training Prescription Yes    Weight 2 lb    Reps 10-15              Perform Capillary Blood Glucose checks as needed.  Exercise Prescription Changes:   Exercise Prescription Changes     Row Name 07/22/23 1500 08/06/23 1700           Response to Exercise   Blood Pressure (Admit) 120/84 114/74      Blood Pressure (Exercise) 132/88 134/78      Blood Pressure (Exit) 126/88 108/64      Heart Rate (Admit) 92 bpm 97 bpm      Heart Rate (Exercise) 109 bpm 108 bpm      Heart Rate (Exit) 89 bpm 94 bpm      Oxygen Saturation (Admit) 90 % 95 %      Oxygen Saturation (Exercise) 90 % 90 %      Oxygen Saturation (Exit) 94 % 93 %      Rating of Perceived Exertion (Exercise) 11 15      Perceived Dyspnea (Exercise) 0 --      Symptoms Nerve pain (same pain as resting) none      Comments results First 2 exercise sessions      Duration Progress to 30 minutes of  aerobic without signs/symptoms of physical distress Progress to 30 minutes of  aerobic without signs/symptoms  of physical distress      Intensity THRR New THRR unchanged        Progression   Progression Continue to progress workloads to maintain intensity without signs/symptoms of physical distress. Continue to progress workloads to maintain intensity without signs/symptoms of physical distress.      Average METs 3.28 2.47        Resistance Training   Training Prescription -- Yes      Weight -- 2 lb      Reps -- 10-15        Interval Training   Interval Training -- No        Recumbant Bike   Level -- 2      Watts -- 16      Minutes -- 15      METs -- 2.61        NuStep   Level -- 2      Minutes -- 15      METs -- 2.3        T5 Nustep   Level -- 1      Minutes -- 15      METs -- 2        Oxygen   Maintain Oxygen Saturation -- 88% or higher               Exercise Comments:   Exercise Comments     Row Name 07/28/23 1117           Exercise Comments First full day of exercise!  Patient was oriented to gym and equipment including functions, settings, policies,  and procedures.  Patient's individual exercise prescription and treatment plan were reviewed.  All starting workloads were established based on the results of the 6 minute walk test done at initial orientation visit.  The plan for exercise progression was also introduced and progression will be customized based on patient's performance and goals.                Exercise Goals and Review:   Exercise Goals     Row Name 07/22/23 1550             Exercise Goals   Increase Physical Activity Yes       Intervention Provide advice, education, support and counseling about physical activity/exercise needs.;Develop an individualized exercise prescription for aerobic and resistive training based on initial evaluation findings, risk stratification, comorbidities and participant's personal goals.       Expected Outcomes Short Term: Attend rehab on a regular basis to increase amount of physical activity.;Long Term: Add in home exercise to make exercise part of routine and to increase amount of physical activity.;Long Term: Exercising regularly at least 3-5 days a week.       Increase Strength and Stamina Yes       Intervention Provide advice, education, support and counseling about physical activity/exercise needs.;Develop an individualized exercise prescription for aerobic and resistive training based on initial evaluation findings, risk stratification, comorbidities and participant's personal goals.       Expected Outcomes Short Term: Increase workloads from initial exercise prescription for resistance, speed, and METs.;Short Term: Perform resistance training exercises routinely during rehab and add in resistance training at home;Long Term: Improve cardiorespiratory fitness, muscular endurance and strength as measured by increased METs and functional capacity ( )       Able to understand and use rate of perceived exertion (RPE) scale Yes       Intervention Provide education and explanation on how  to  use RPE scale       Expected Outcomes Short Term: Able to use RPE daily in rehab to express subjective intensity level;Long Term:  Able to use RPE to guide intensity level when exercising independently       Able to understand and use Dyspnea scale Yes       Intervention Provide education and explanation on how to use Dyspnea scale       Expected Outcomes Short Term: Able to use Dyspnea scale daily in rehab to express subjective sense of shortness of breath during exertion;Long Term: Able to use Dyspnea scale to guide intensity level when exercising independently       Knowledge and understanding of Target Heart Rate Range (THRR) Yes       Intervention Provide education and explanation of THRR including how the numbers were predicted and where they are located for reference       Expected Outcomes Short Term: Able to state/look up THRR;Short Term: Able to use daily as guideline for intensity in rehab;Long Term: Able to use THRR to govern intensity when exercising independently       Able to check pulse independently Yes       Intervention Provide education and demonstration on how to check pulse in carotid and radial arteries.;Review the importance of being able to check your own pulse for safety during independent exercise       Expected Outcomes Short Term: Able to explain why pulse checking is important during independent exercise;Long Term: Able to check pulse independently and accurately       Understanding of Exercise Prescription Yes       Intervention Provide education, explanation, and written materials on patient's individual exercise prescription       Expected Outcomes Short Term: Able to explain program exercise prescription;Long Term: Able to explain home exercise prescription to exercise independently                Exercise Goals Re-Evaluation :  Exercise Goals Re-Evaluation     Row Name 07/28/23 1117 08/06/23 1747           Exercise Goal Re-Evaluation   Exercise Goals  Review Able to understand and use rate of perceived exertion (RPE) scale;Able to understand and use Dyspnea scale;Knowledge and understanding of Target Heart Rate Range (THRR);Understanding of Exercise Prescription Increase Physical Activity;Understanding of Exercise Prescription;Increase Strength and Stamina      Comments Reviewed RPE and dyspnea scale, THR and program prescription with pt today.  Pt voiced understanding and was given a copy of goals to take home. Tonya Odonnell is off to a good start in the program and tolerated her exercise prescription well. She was able to do level 2 on the recumbent bike and T4 nustep. She also was able to do level 1 on the T5 nustep. We will continue to monitor her progress in the program.      Expected Outcomes Short: Use RPE daily to regulate intensity. Long: Follow program prescription in THR. Short: Continue to follow current exercise prescription. Long: Continue exercise to improve strength and stamina.               Discharge Exercise Prescription (Final Exercise Prescription Changes):  Exercise Prescription Changes - 08/06/23 1700       Response to Exercise   Blood Pressure (Admit) 114/74    Blood Pressure (Exercise) 134/78    Blood Pressure (Exit) 108/64    Heart Rate (Admit) 97 bpm    Heart  Rate (Exercise) 108 bpm    Heart Rate (Exit) 94 bpm    Oxygen Saturation (Admit) 95 %    Oxygen Saturation (Exercise) 90 %    Oxygen Saturation (Exit) 93 %    Rating of Perceived Exertion (Exercise) 15    Symptoms none    Comments First 2 exercise sessions    Duration Progress to 30 minutes of  aerobic without signs/symptoms of physical distress    Intensity THRR unchanged      Progression   Progression Continue to progress workloads to maintain intensity without signs/symptoms of physical distress.    Average METs 2.47      Resistance Training   Training Prescription Yes    Weight 2 lb    Reps 10-15      Interval Training   Interval Training No       Recumbant Bike   Level 2    Watts 16    Minutes 15    METs 2.61      NuStep   Level 2    Minutes 15    METs 2.3      T5 Nustep   Level 1    Minutes 15    METs 2      Oxygen   Maintain Oxygen Saturation 88% or higher             Nutrition:  Target Goals: Understanding of nutrition guidelines, daily intake of sodium 1500mg , cholesterol 200mg , calories 30% from fat and 7% or less from saturated fats, daily to have 5 or more servings of fruits and vegetables.  Education: All About Nutrition: -Group instruction provided by verbal, written material, interactive activities, discussions, models, and posters to present general guidelines for heart healthy nutrition including fat, fiber, MyPlate, the role of sodium in heart healthy nutrition, utilization of the nutrition label, and utilization of this knowledge for meal planning. Follow up email sent as well. Written material given at graduation.   Biometrics:  Pre Biometrics - 07/22/23 1551       Pre Biometrics   Height 5' 3.2" (1.605 m)    Weight 180 lb 4.8 oz (81.8 kg)    Waist Circumference 40 inches    Hip Circumference 44 inches    Waist to Hip Ratio 0.91 %    BMI (Calculated) 31.75    Single Leg Stand 30 seconds              Nutrition Therapy Plan and Nutrition Goals:  Nutrition Therapy & Goals - 07/28/23 1116       Nutrition Therapy   Diet liberaliazed due to poor appetite    Protein (specify units) >75g    Fiber 25 grams    Whole Grain Foods 3 servings    Saturated Fats 15 max. grams    Fruits and Vegetables 5 servings/day    Sodium 2 grams      Personal Nutrition Goals   Nutrition Goal Eat 3 times per day, small frequent meals or nutrient dense snacks    Personal Goal #2 Eat 15-30gProtein and 30-60gCarbs at each meal.    Comments Patient on selexipag  and reports it has ruined her appetite and when she does eat she feels bloated quickly experiences diarrhea. She sometimes gets used to the  medication but then the does is increased and then she struggles again. Spoke with her today about prioritizing quality over quantity. To make sure the little food she can eat and tolerate helps her meet her nutritional goals. Provided  handout with several small meal and snack ideas focusing on balance of complex carbs and protein. Educated on types of fats, sources and how to add more calories to less quantity of food.      Intervention Plan   Intervention Prescribe, educate and counsel regarding individualized specific dietary modifications aiming towards targeted core components such as weight, hypertension, lipid management, diabetes, heart failure and other comorbidities.;Nutrition handout(s) given to patient.    Expected Outcomes Short Term Goal: Understand basic principles of dietary content, such as calories, fat, sodium, cholesterol and nutrients.;Short Term Goal: A plan has been developed with personal nutrition goals set during dietitian appointment.;Long Term Goal: Adherence to prescribed nutrition plan.             Nutrition Assessments:  MEDIFICTS Score Key: >=70 Need to make dietary changes  40-70 Heart Healthy Diet <= 40 Therapeutic Level Cholesterol Diet   Picture Your Plate Scores: <13 Unhealthy dietary pattern with much room for improvement. 41-50 Dietary pattern unlikely to meet recommendations for good health and room for improvement. 51-60 More healthful dietary pattern, with some room for improvement.  >60 Healthy dietary pattern, although there may be some specific behaviors that could be improved.   Nutrition Goals Re-Evaluation:   Nutrition Goals Discharge (Final Nutrition Goals Re-Evaluation):   Psychosocial: Target Goals: Acknowledge presence or absence of significant depression and/or stress, maximize coping skills, provide positive support system. Participant is able to verbalize types and ability to use techniques and skills needed for reducing stress  and depression.   Education: Stress, Anxiety, and Depression - Group verbal and visual presentation to define topics covered.  Reviews how body is impacted by stress, anxiety, and depression.  Also discusses healthy ways to reduce stress and to treat/manage anxiety and depression.  Written material given at graduation.   Education: Sleep Hygiene -Provides group verbal and written instruction about how sleep can affect your health.  Define sleep hygiene, discuss sleep cycles and impact of sleep habits. Review good sleep hygiene tips.    Initial Review & Psychosocial Screening:  Initial Psych Review & Screening - 07/20/23 1339       Initial Review   Current issues with None Identified      Family Dynamics   Good Support System? Yes    Comments Tonya Odonnell states that she lives with her son and has a good family support system.She states no mental instability.      Barriers   Psychosocial barriers to participate in program The patient should benefit from training in stress management and relaxation.;There are no identifiable barriers or psychosocial needs.      Screening Interventions   Interventions Encouraged to exercise;To provide support and resources with identified psychosocial needs;Provide feedback about the scores to participant    Expected Outcomes Short Term goal: Utilizing psychosocial counselor, staff and physician to assist with identification of specific Stressors or current issues interfering with healing process. Setting desired goal for each stressor or current issue identified.;Long Term Goal: Stressors or current issues are controlled or eliminated.;Short Term goal: Identification and review with participant of any Quality of Life or Depression concerns found by scoring the questionnaire.;Long Term goal: The participant improves quality of Life and PHQ9 Scores as seen by post scores and/or verbalization of changes             Quality of Life Scores:  Scores of 19 and  below usually indicate a poorer quality of life in these areas.  A difference of  2-3 points  is a clinically meaningful difference.  A difference of 2-3 points in the total score of the Quality of Life Index has been associated with significant improvement in overall quality of life, self-image, physical symptoms, and general health in studies assessing change in quality of life.  PHQ-9: Review Flowsheet  More data exists      07/22/2023 12/10/2022 06/17/2021 06/13/2021 06/03/2021  Depression screen PHQ 2/9  Decreased Interest 0 1 1 0 0 0  Down, Depressed, Hopeless 0 1 1 0 0 0  PHQ - 2 Score 0 2 2 0 0 0  Altered sleeping 0 3 3 2  - -  Tired, decreased energy 3 3 3 3  - -  Change in appetite 2 1 2  0 - -  Feeling bad or failure about yourself  0 -- 0 1 - -  Trouble concentrating 0 0 0 1 - -  Moving slowly or fidgety/restless 0 0 1 0 - -  Suicidal thoughts 0 -- 0 0 - -  PHQ-9 Score 5 9 11 7  - -  Difficult doing work/chores - Very difficult Very difficult - - -    Details       Multiple values from one day are sorted in reverse-chronological order        Interpretation of Total Score  Total Score Depression Severity:  1-4 = Minimal depression, 5-9 = Mild depression, 10-14 = Moderate depression, 15-19 = Moderately severe depression, 20-27 = Severe depression   Psychosocial Evaluation and Intervention:  Psychosocial Evaluation - 07/20/23 1341       Psychosocial Evaluation & Interventions   Interventions Encouraged to exercise with the program and follow exercise prescription;Relaxation education;Stress management education    Comments Tonya Odonnell states that she lives with her son and has a good family support system.She states no mental instability.    Expected Outcomes Short: Start [facility] to help with mood. Long: Maintain a healthy mental state    Continue Psychosocial Services  Follow up required by staff             Psychosocial Re-Evaluation:   Psychosocial Discharge (Final  Psychosocial Re-Evaluation):   Education: Education Goals: Education classes will be provided on a weekly basis, covering required topics. Participant will state understanding/return demonstration of topics presented.  Learning Barriers/Preferences:  Learning Barriers/Preferences - 07/20/23 1336       Learning Barriers/Preferences   Learning Barriers None    Learning Preferences None             General Pulmonary Education Topics:  Infection Prevention: - Provides verbal and written material to individual with discussion of infection control including proper hand washing and proper equipment cleaning during exercise session. Flowsheet Row Pulmonary Rehab from 07/22/2023 in [facility] Cardiac and Pulmonary Rehab  Date 07/22/23  Educator MB  Instruction Review Code 1- Verbalizes Understanding       Falls Prevention: - Provides verbal and written material to individual with discussion of falls prevention and safety. Flowsheet Row Pulmonary Rehab from 07/22/2023 in [facility] Cardiac and Pulmonary Rehab  Date 07/22/23  Educator MB  Instruction Review Code 1- Verbalizes Understanding       Chronic Lung Disease Review: - Group verbal instruction with posters, models, PowerPoint presentations and videos,  to review new updates, new respiratory medications, new advancements in procedures and treatments. Providing information on websites and "800" numbers for continued self-education. Includes information about supplement oxygen, available portable oxygen systems, continuous and intermittent flow rates, oxygen safety, concentrators, and Medicare reimbursement for oxygen. Explanation  of Pulmonary Drugs, including class, frequency, complications, importance of spacers, rinsing mouth after steroid MDI's, and proper cleaning methods for nebulizers. Review of basic lung anatomy and physiology related to function, structure, and complications of lung disease. Review of risk factors. Discussion about  methods for diagnosing sleep apnea and types of masks and machines for OSA. Includes a review of the use of types of environmental controls: home humidity, furnaces, filters, dust mite/pet prevention, HEPA vacuums. Discussion about weather changes, air quality and the benefits of nasal washing. Instruction on Warning signs, infection symptoms, calling MD promptly, preventive modes, and value of vaccinations. Review of effective airway clearance, coughing and/or vibration techniques. Emphasizing that all should Create an Action Plan. Written material given at graduation.   AED/CPR: - Group verbal and written instruction with the use of models to demonstrate the basic use of the AED with the basic ABC's of resuscitation.    Anatomy and Cardiac Procedures: - Group verbal and visual presentation and models provide information about basic cardiac anatomy and function. Reviews the testing methods done to diagnose heart disease and the outcomes of the test results. Describes the treatment choices: Medical Management, Angioplasty, or Coronary Bypass Surgery for treating various heart conditions including Myocardial Infarction, Angina, Valve Disease, and Cardiac Arrhythmias.  Written material given at graduation.   Medication Safety: - Group verbal and visual instruction to review commonly prescribed medications for heart and lung disease. Reviews the medication, class of the drug, and side effects. Includes the steps to properly store meds and maintain the prescription regimen.  Written material given at graduation.   Other: -Provides group and verbal instruction on various topics (see comments)   Knowledge Questionnaire Score:    Core Components/Risk Factors/Patient Goals at Admission:  Personal Goals and Risk Factors at Admission - 07/22/23 1552       Core Components/Risk Factors/Patient Goals on Admission    Weight Management Yes;Weight Loss    Intervention Weight Management: Develop a  combined nutrition and exercise program designed to reach desired caloric intake, while maintaining appropriate intake of nutrient and fiber, sodium and fats, and appropriate energy expenditure required for the weight goal.;Weight Management: Provide education and appropriate resources to help participant work on and attain dietary goals.;Weight Management/Obesity: Establish reasonable short term and long term weight goals.    Admit Weight 180 lb 4.8 oz (81.8 kg)    Goal Weight: Short Term 170 lb (77.1 kg)    Goal Weight: Long Term 160 lb (72.6 kg)    Expected Outcomes Short Term: Continue to assess and modify interventions until short term weight is achieved;Weight Loss: Understanding of general recommendations for a balanced deficit meal plan, which promotes 1-2 lb weight loss per week and includes a negative energy balance of [phone] kcal/d;Understanding recommendations for meals to include 15-35% energy as protein, 25-35% energy from fat, 35-60% energy from carbohydrates, less than 200mg  of dietary cholesterol, 20-35 gm of total fiber daily;Understanding of distribution of calorie intake throughout the day with the consumption of 4-5 meals/snacks;Long Term: Adherence to nutrition and physical activity/exercise program aimed toward attainment of established weight goal    Improve shortness of breath with ADL's Yes    Intervention Provide education, individualized exercise plan and daily activity instruction to help decrease symptoms of SOB with activities of daily living.    Expected Outcomes Short Term: Improve cardiorespiratory fitness to achieve a reduction of symptoms when performing ADLs;Long Term: Be able to perform more ADLs without symptoms or delay the onset of symptoms  Hypertension Yes    Intervention Provide education on lifestyle modifcations including regular physical activity/exercise, weight management, moderate sodium restriction and increased consumption of fresh fruit, vegetables,  and low fat dairy, alcohol moderation, and smoking cessation.;Monitor prescription use compliance.    Expected Outcomes Short Term: Continued assessment and intervention until BP is < 140/26mm HG in hypertensive participants. < 130/35mm HG in hypertensive participants with diabetes, heart failure or chronic kidney disease.;Long Term: Maintenance of blood pressure at goal levels.             Education:Diabetes - Individual verbal and written instruction to review signs/symptoms of diabetes, desired ranges of glucose level fasting, after meals and with exercise. Acknowledge that pre and post exercise glucose checks will be done for 3 sessions at entry of program.   Know Your Numbers and Heart Failure: - Group verbal and visual instruction to discuss disease risk factors for cardiac and pulmonary disease and treatment options.  Reviews associated critical values for Overweight/Obesity, Hypertension, Cholesterol, and Diabetes.  Discusses basics of heart failure: signs/symptoms and treatments.  Introduces Heart Failure Zone chart for action plan for heart failure.  Written material given at graduation.   Core Components/Risk Factors/Patient Goals Review:    Core Components/Risk Factors/Patient Goals at Discharge (Final Review):    ITP Comments:  ITP Comments     Row Name 07/20/23 1338 07/22/23 1541 07/28/23 1117 08/12/23 1024     ITP Comments Virtual Visit completed. Patient informed on EP and RD appointment and 6 Minute walk test. Patient also informed of patient health questionnaires on My Chart. Patient Verbalizes understanding. Visit diagnosis can be found in [facility] 06/05/2023. Completed and gym orientation for respiratory care services. Initial ITP created and sent for review to Dr. Faud Aleskerov, Medical Director. First full day of exercise!  Patient was oriented to gym and equipment including functions, settings, policies, and procedures.  Patient's individual exercise prescription  and treatment plan were reviewed.  All starting workloads were established based on the results of the 6 minute walk test done at initial orientation visit.  The plan for exercise progression was also introduced and progression will be customized based on patient's performance and goals. 30 Day review completed. Medical Director ITP review done, changes made as directed, and signed approval by Medical Director.    new to program             Comments:

## 2023-08-13 ENCOUNTER — Encounter: Admitting: *Deleted

## 2023-08-13 DIAGNOSIS — I272 Pulmonary hypertension, unspecified: Secondary | ICD-10-CM

## 2023-08-13 DIAGNOSIS — Z7689 Persons encountering health services in other specified circumstances: Secondary | ICD-10-CM | POA: Diagnosis not present

## 2023-08-13 NOTE — Progress Notes (Signed)
 Daily Session Note  Patient Details  Name: Tonya Odonnell MRN: [id number] Date of Birth: Jan 27, 1966 Referring Provider:   Flowsheet Row Pulmonary Rehab from 07/22/2023 in [facility] Cardiac and Pulmonary Rehab  Referring Provider Peder Bourdon, MD       Encounter Date: 08/13/2023  Check In:  Session Check In - 08/13/23 1127       Check-In   Supervising physician immediately available to respond to emergencies See telemetry face sheet for immediately available ER MD    Location [facility]-Cardiac & Pulmonary Rehab    Staff Present Maud Sorenson, RN, BSN, CCRP;Margaret Best, MS, Exercise Physiologist;Joseph Hood RCP,RRT,BSRT;Maxon Conetta BS, Exercise Physiologist    Virtual Visit No    Medication changes reported     No    Fall or balance concerns reported    No    Warm-up and Cool-down Performed on first and last piece of equipment    Resistance Training Performed Yes    VAD Patient? No    PAD/SET Patient? No      Pain Assessment   Currently in Pain? No/denies                Social History   Tobacco Use  Smoking Status Never  Smokeless Tobacco Never    Goals Met:  Proper associated with RPD/PD & O2 Sat Independence with exercise equipment Exercise tolerated well No report of concerns or symptoms today  Goals Unmet:  Not Applicable  Comments: Pt able to follow exercise prescription today without complaint.  Will continue to monitor for progression.    Dr. Firman Hughes is Medical Director for [facility] Cardiac Rehabilitation.  Dr. Fuad Aleskerov is Medical Director for [facility] Pulmonary Rehabilitation.

## 2023-08-17 ENCOUNTER — Ambulatory Visit: Payer: Medicaid Other | Admitting: Internal Medicine

## 2023-08-17 ENCOUNTER — Inpatient Hospital Stay: Attending: Oncology

## 2023-08-17 ENCOUNTER — Encounter: Payer: Self-pay | Admitting: Oncology

## 2023-08-17 ENCOUNTER — Inpatient Hospital Stay: Admitting: Oncology

## 2023-08-17 ENCOUNTER — Other Ambulatory Visit: Payer: Medicaid Other

## 2023-08-17 VITALS — BP 117/69 | HR 118 | Temp 98.2°F | Resp 18 | Ht 63.2 in | Wt 181.9 lb

## 2023-08-17 DIAGNOSIS — R5383 Other fatigue: Secondary | ICD-10-CM | POA: Diagnosis not present

## 2023-08-17 DIAGNOSIS — Z853 Personal history of malignant neoplasm of breast: Secondary | ICD-10-CM | POA: Insufficient documentation

## 2023-08-17 DIAGNOSIS — R531 Weakness: Secondary | ICD-10-CM | POA: Insufficient documentation

## 2023-08-17 DIAGNOSIS — C50412 Malignant neoplasm of upper-outer quadrant of left female breast: Secondary | ICD-10-CM

## 2023-08-17 DIAGNOSIS — Z9012 Acquired absence of left breast and nipple: Secondary | ICD-10-CM | POA: Insufficient documentation

## 2023-08-17 DIAGNOSIS — Z9221 Personal history of antineoplastic chemotherapy: Secondary | ICD-10-CM | POA: Diagnosis not present

## 2023-08-17 DIAGNOSIS — Z7689 Persons encountering health services in other specified circumstances: Secondary | ICD-10-CM | POA: Diagnosis not present

## 2023-08-17 LAB — CMP (CANCER CENTER ONLY)
ALT: 19 U/L (ref 0–44)
AST: 28 U/L (ref 15–41)
Albumin: 4.2 g/dL (ref 3.5–5.0)
Alkaline Phosphatase: 80 U/L (ref 38–126)
Anion gap: 11 (ref 5–15)
BUN: 16 mg/dL (ref 6–20)
CO2: 22 mmol/L (ref 22–32)
Calcium: 8.7 mg/dL — ABNORMAL LOW (ref 8.9–10.3)
Chloride: 106 mmol/L (ref 98–111)
Creatinine: 0.82 mg/dL (ref 0.44–1.00)
GFR, Estimated: >60 mL/min (ref >60)
Glucose, Bld: 153 mg/dL — ABNORMAL HIGH (ref 70–99)
Potassium: 3.2 mmol/L — ABNORMAL LOW (ref 3.5–5.1)
Sodium: 139 mmol/L (ref 135–145)
Total Bilirubin: 1.2 mg/dL (ref 0.0–1.2)
Total Protein: 7.3 g/dL (ref 6.5–8.1)

## 2023-08-17 LAB — CBC WITH DIFFERENTIAL (CANCER CENTER ONLY)
Abs Immature Granulocytes: 0.04 10*3/uL (ref 0.00–0.07)
Basophils Absolute: 0 10*3/uL (ref 0.0–0.1)
Basophils Relative: 1 %
Eosinophils Absolute: 0.1 10*3/uL (ref 0.0–0.5)
Eosinophils Relative: 1 %
HCT: 43.4 % (ref 36.0–46.0)
Hemoglobin: 14.2 g/dL (ref 12.0–15.0)
Immature Granulocytes: 1 %
Lymphocytes Relative: 37 %
Lymphs Abs: 2.2 10*3/uL (ref 0.7–4.0)
MCH: 28.1 pg (ref 26.0–34.0)
MCHC: 32.7 g/dL (ref 30.0–36.0)
MCV: 85.9 fL (ref 80.0–100.0)
Monocytes Absolute: 0.3 10*3/uL (ref 0.1–1.0)
Monocytes Relative: 5 %
Neutro Abs: 3.4 10*3/uL (ref 1.7–7.7)
Neutrophils Relative %: 55 %
Platelet Count: 310 10*3/uL (ref 150–400)
RBC: 5.05 MIL/uL (ref 3.87–5.11)
RDW: 13.6 % (ref 11.5–15.5)
WBC Count: 6.1 10*3/uL (ref 4.0–10.5)
nRBC: 0 % (ref 0.0–0.2)

## 2023-08-17 NOTE — Progress Notes (Signed)
 [facility]  Telephone:([phone] [phone]) [phone]  ID: Lex Redbird OB: [birth year]  MR#: [id number]  VHQ#:[id number]  Patient Care Team: Lemar Pyles, NP as PCP - General (Nurse Practitioner) Linn Rich, [facility] (Pharmacist) Shellie Dials, MD as Consulting Physician (Oncology)  CHIEF COMPLAINT: History of breast cancer.  INTERVAL HISTORY: Patient returns to clinic today for routine yearly evaluation.  She continues to have multiple medical complaints unrelated to her history of breast cancer.  She has no neurologic complaints.  She has chronic weakness and fatigue.  She has a fair appetite, but denies weight loss.  She has chronic shortness of breath, but denies any chest pain, cough, or hemoptysis.  She denies any nausea, vomiting, constipation, or diarrhea.  She has no urinary complaints.  Patient offers no further specific complaints today.  REVIEW OF SYSTEMS:   Review of Systems  Constitutional:  Positive for malaise/fatigue. Negative for fever and weight loss.  Respiratory:  Positive for shortness of breath. Negative for cough and hemoptysis.   Cardiovascular: Negative.  Negative for chest pain and leg swelling.  Gastrointestinal: Negative.  Negative for abdominal pain.  Genitourinary: Negative.  Negative for dysuria.  Musculoskeletal:  Positive for myalgias.  Skin: Negative.  Negative for rash.  Neurological:  Positive for weakness. Negative for dizziness, focal weakness and headaches.  Psychiatric/Behavioral: Negative.  The patient is not nervous/anxious.     As per HPI. Otherwise, a complete review of systems is negative.  PAST MEDICAL HISTORY: Past Medical History:  Diagnosis Date   Anemia    "USE TO HAVE IT,NOT NOW,2015,2016   Arthritis    HIPS,KNEES,HANDS   Breast cancer in female (HCC)    Cancer (HCC)    LEFT   Hyperlipidemia    Hypertension    Low iron    Neuromuscular disorder (HCC)    MUSCLE PAIN,FIBROMYALGIA    Osteopenia    Pulmonary HTN (HCC)    PVC (premature ventricular contraction)    Thyroid  disease     PAST SURGICAL HISTORY: Past Surgical History:  Procedure Laterality Date   LAPAROSCOPIC APPENDECTOMY N/A 03/27/2023   Procedure: APPENDECTOMY LAPAROSCOPIC;  Surgeon: Stechschulte, Avon Boers, MD;  Location: [facility] OR;  Service: General;  Laterality: N/A;   Left Ovarian removal Left    MASTECTOMY Left    "LYMPHNODES REMOVED   RIGHT HEART CATH N/A 07/02/2023   Procedure: RIGHT HEART CATH;  Surgeon: Darlis Eisenmenger, MD;  Location: [facility] INVASIVE CV LAB;  Service: Cardiovascular;  Laterality: N/A;   RIGHT/LEFT HEART CATH AND CORONARY ANGIOGRAPHY N/A 12/12/2022   Procedure: RIGHT/LEFT HEART CATH AND CORONARY ANGIOGRAPHY;  Surgeon: Wenona Hamilton, MD;  Location: [facility] INVASIVE CV LAB;  Service: Cardiovascular;  Laterality: N/A;    FAMILY HISTORY: Family History  Problem Relation Age of Onset   Hypertension Mother    Leukemia Mother    Stomach cancer Father    Colon cancer Neg Hx    Colon polyps Neg Hx    Crohn's disease Neg Hx    Esophageal cancer Neg Hx    Rectal cancer Neg Hx     ADVANCED DIRECTIVES (Y/N):  N  HEALTH MAINTENANCE: Social History   Tobacco Use   Smoking status: Never   Smokeless tobacco: Never  Vaping Use   Vaping status: Never Used  Substance Use Topics   Alcohol use: Never   Drug use: Never     Colonoscopy:  PAP:  Bone density:  Lipid panel:  Allergies  Allergen Reactions  Lisinopril Other (See Comments)    cough     Current Outpatient Medications  Medication Sig Dispense Refill   ambrisentan  (LETAIRIS ) 10 MG tablet Take 1 tablet (10 mg total) by mouth daily. 30 tablet 11   Azelaic Acid  15 % gel Apply to face every morning 50 g 5   bismuth subsalicylate (PEPTO BISMOL) 262 MG/15ML suspension Take 30 mLs by mouth every 6 (six) hours as needed for diarrhea or loose stools.     clobetasol  cream (TEMOVATE ) 0.05 % Apply 1 Application topically as needed. 30  g 4   cromolyn  (OPTICROM ) 4 % ophthalmic solution Place 1 drop into both eyes 4 (four) times daily. (Patient taking differently: Place 1 drop into both eyes daily as needed (Dry eye).) 10 mL 4   ELIDEL  1 % cream Apply to red scaly areas on the face BID for seb derm 30 g 4   fluticasone  (FLONASE ) 50 MCG/ACT nasal spray Place 2 sprays into both nostrils daily as needed for allergies or rhinitis.     furosemide  (LASIX ) 40 MG tablet Take 1 tablet (40 mg total) by mouth daily. 90 tablet 1   hydrALAZINE  (APRESOLINE ) 25 MG tablet Take 1 tablet (25 mg total) by mouth 2 (two) times daily. 180 tablet 1   levothyroxine  (SYNTHROID ) 112 MCG tablet Take 1 pill everyday except Sunday take half a pill 90 tablet 1   omeprazole  (PRILOSEC) 40 MG capsule Take 40 mg by mouth daily as needed (Heartburn).     potassium chloride  SA (KLOR-CON  M) 20 MEQ tablet Take 1 tablet (20 mEq total) by mouth daily. 90 tablet 1   Selexipag  800 MCG TABS Take 1 tablet (800 mcg total) by mouth 2 (two) times daily. (Patient taking differently: Take 800 mcg by mouth 2 (two) times daily. Uptravi ) 60 tablet 11   tadalafil , PAH, (ADCIRCA ) 20 MG tablet Take 2 tablets (40 mg total) by mouth daily. NDC's covered by plan: 16109604540, 98119147829, [id number]     valACYclovir  (VALTREX ) 1000 MG tablet Take 1,000 mg by mouth daily as needed (Cold Sores).     Ivermectin  (SOOLANTRA ) 1 % CREA Apply 1 Application topically at bedtime. (Patient not taking: Reported on 06/30/2023) 45 g 5   No current facility-administered medications for this visit.    OBJECTIVE: Vitals:   08/17/23 1033  BP: 117/69  Pulse: (!) 118  Resp: 18  Temp: 98.2 F (36.8 C)  SpO2: 97%     Body mass index is 32.02 kg/m.    ECOG FS:0 - Asymptomatic  General: Well-developed, well-nourished, no acute distress. Eyes: Pink conjunctiva, anicteric sclera. HEENT: Normocephalic, moist mucous membranes. Lungs: No audible wheezing or coughing. Heart: Regular rate and  rhythm. Abdomen: Soft, nontender, no obvious distention. Musculoskeletal: No edema, cyanosis, or clubbing. Neuro: Alert, answering all questions appropriately. Cranial nerves grossly intact. Skin: No rashes or petechiae noted. Psych: Normal affect. Lymphatics: No cervical, calvicular, axillary or inguinal LAD.   LAB RESULTS:  Lab Results  Component Value Date   NA 139 08/17/2023   K 3.2 (L) 08/17/2023   CL 106 08/17/2023   CO2 22 08/17/2023   GLUCOSE 153 (H) 08/17/2023   BUN 16 08/17/2023   CREATININE 0.82 08/17/2023   CALCIUM  8.7 (L) 08/17/2023   PROT 7.3 08/17/2023   ALBUMIN  4.2 08/17/2023   AST 28 08/17/2023   ALT 19 08/17/2023   ALKPHOS 80 08/17/2023   BILITOT 1.2 08/17/2023   GFRNONAA >60 08/17/2023    Lab Results  Component Value Date  WBC 6.1 08/17/2023   NEUTROABS 3.4 08/17/2023   HGB 14.2 08/17/2023   HCT 43.4 08/17/2023   MCV 85.9 08/17/2023   PLT 310 08/17/2023     STUDIES: No results found.  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  She had mastectomy and chemotherapy at the time of diagnosis.  She then completed 5 years of endocrine therapy in December 2022.  Her most recent mammogram on Aug 04, 2023 was reported as BI-RADS 2.  No intervention is needed at this time.  Patient is over 2 years removed from completing her endocrine therapy and can be discharged from clinic.  Recommend primary care continue yearly screening mammograms.  No follow-up has been scheduled.  Please refer patient back if there are any questions or concerns.    I spent a total of 20 minutes reviewing chart data, face-to-face evaluation with the patient, counseling and coordination of care as detailed above.  Patient expressed understanding and was in agreement with this plan. She also understands that She can call clinic at any time with  any questions, concerns, or complaints.    Cancer Staging  No matching staging information was found for the patient.   Shellie Dials, MD   08/17/2023 3:29 PM

## 2023-08-18 ENCOUNTER — Encounter

## 2023-08-19 ENCOUNTER — Encounter: Payer: Self-pay | Admitting: Nurse Practitioner

## 2023-08-19 ENCOUNTER — Ambulatory Visit: Admitting: Nurse Practitioner

## 2023-08-19 VITALS — BP 127/78 | HR 77 | Temp 98.0°F | Ht 63.0 in | Wt 181.0 lb

## 2023-08-19 DIAGNOSIS — I272 Pulmonary hypertension, unspecified: Secondary | ICD-10-CM

## 2023-08-19 DIAGNOSIS — I10 Essential (primary) hypertension: Secondary | ICD-10-CM | POA: Diagnosis not present

## 2023-08-19 DIAGNOSIS — E063 Autoimmune thyroiditis: Secondary | ICD-10-CM

## 2023-08-19 DIAGNOSIS — E782 Mixed hyperlipidemia: Secondary | ICD-10-CM

## 2023-08-19 DIAGNOSIS — I50810 Right heart failure, unspecified: Secondary | ICD-10-CM

## 2023-08-19 DIAGNOSIS — E559 Vitamin D deficiency, unspecified: Secondary | ICD-10-CM

## 2023-08-19 DIAGNOSIS — E538 Deficiency of other specified B group vitamins: Secondary | ICD-10-CM | POA: Diagnosis not present

## 2023-08-19 DIAGNOSIS — E54 Ascorbic acid deficiency: Secondary | ICD-10-CM

## 2023-08-19 DIAGNOSIS — R220 Localized swelling, mass and lump, head: Secondary | ICD-10-CM | POA: Diagnosis not present

## 2023-08-19 DIAGNOSIS — E66811 Obesity, class 1: Secondary | ICD-10-CM | POA: Diagnosis not present

## 2023-08-19 DIAGNOSIS — R7303 Prediabetes: Secondary | ICD-10-CM

## 2023-08-19 DIAGNOSIS — Z7689 Persons encountering health services in other specified circumstances: Secondary | ICD-10-CM | POA: Diagnosis not present

## 2023-08-19 DIAGNOSIS — E6609 Other obesity due to excess calories: Secondary | ICD-10-CM

## 2023-08-19 LAB — BAYER DCA HB A1C WAIVED: HB A1C (BAYER DCA - WAIVED): 5.2 % (ref 4.8–5.6)

## 2023-08-19 LAB — MICROALBUMIN, URINE WAIVED
Creatinine, Urine Waived: 200 mg/dL (ref 10–300)
Microalb, Ur Waived: 80 mg/L — ABNORMAL HIGH (ref 0–19)

## 2023-08-19 NOTE — Assessment & Plan Note (Signed)
 Minimal under eyelids noted today, suspect related to underlying allergies.  Recommend she continue Flonase , but also restart Zyrtec  which she has at home. Take daily for the next 3-4 weeks.  Will check labs today.

## 2023-08-19 NOTE — Assessment & Plan Note (Signed)
 Chronic, ongoing. Continue current medication regimen and adjust as needed based on labs.  Continue collaboration with endocrinology.  Recent notes reviewed.

## 2023-08-19 NOTE — Assessment & Plan Note (Signed)
BMI 32.06.  Recommended eating smaller high protein, low fat meals more frequently and exercising 30 mins a day 5 times a week with a goal of 10-15lb weight loss in the next 3 months. Patient voiced their understanding and motivation to adhere to these recommendations.

## 2023-08-19 NOTE — Assessment & Plan Note (Signed)
 Chronic, ongoing.  Recommend continued focus on diet and regular activity. Lipid panel today, discussed with her possible statin initiation.   The 10-year ASCVD risk score (Arnett DK, et al., 2019) is: 3.1%   Values used to calculate the score:     Age: 58 years     Sex: Female     Is Non-Hispanic African American: No     Diabetic: No     Tobacco smoker: No     Systolic Blood Pressure: 127 mmHg     Is BP treated: Yes     HDL Cholesterol: 65 mg/dL     Total Cholesterol: 226 mg/dL

## 2023-08-19 NOTE — Patient Instructions (Signed)

## 2023-08-19 NOTE — Assessment & Plan Note (Addendum)
 Diagnosed October 2024. Will continue medications as ordered by specialists. Will continue collaboration with patient and care team.  Recent labs/notes reviewed.

## 2023-08-19 NOTE — Assessment & Plan Note (Signed)
 Diagnosed October 2024 with underlying pulmonary hypertension.  Continue Tadalafil  as ordered and collaboration with specialists, all recent notes reviewed.

## 2023-08-19 NOTE — Assessment & Plan Note (Signed)
 Chronic, ongoing.  BP at goal in office today.  Recommend she monitor BP at least a few mornings a week at home and document.  DASH diet at home.  Continue current medication regimen.  Labs: CMP and urine ALB.  Urine ALB 80 April 2024.

## 2023-08-19 NOTE — Assessment & Plan Note (Signed)
Noted on past labs, check level today and recommend diet focus.  Level 5.7% last visit, highly recommend diet and exercise focus.

## 2023-08-19 NOTE — Progress Notes (Signed)
 BP 127/78   Pulse 77   Temp 98 F (36.7 C) (Oral)   Ht 5' 3 (1.6 m)   Wt 181 lb (82.1 kg)   LMP  (LMP Unknown)   SpO2 96%   BMI 32.06 kg/m    Subjective:    Patient ID: Tonya Odonnell, female    DOB: 1965-07-05, 58 y.o.   MRN: [id number]  HPI: Tonya Odonnell is a 58 y.o. female  Chief Complaint  Patient presents with   Pulmonary Hypertension   PULMONARY HYPERTENSION Currently followed by pulmonary and cardiology. This was diagnosed in October 2024. Saw hematology/oncology, they reported no need to return to see them unless issues.  To do mammogram yearly.  She stopped taking Vitamin D, B12, and C -- feels that pain in right neck has improved with stopping this.  Has seen ENT and endo for the right side neck discomfort, she reports they told her it may be GERD.  She would like labs rechecked today as recent labs showed CA+ and K+ low and glucose elevated.  Also reports some recent sinus issues and puffy under eyes.  Currently performing pulmonary rehab.  Last right heart cath was 07/02/23. HF Clinic last 06/05/23, Endo 06/04/23, Derm 04/22/23, pulmonary 02/10/23 Hypertension status: stable  Satisfied with current treatment? yes Duration of hypertension: chronic BP monitoring frequency:  occasionally BP range: <120/70 or less on average BP medication side effects:  no Medication compliance: good compliance Aspirin : no Recurrent headaches: no Visual changes: no Palpitations: no Dyspnea: no Chest pain: no Lower extremity edema: no Dizzy/lightheaded: no   Relevant past medical, surgical, family and social history reviewed and updated as indicated. Interim medical history since our last visit reviewed. Allergies and medications reviewed and updated.  Review of Systems  Constitutional:  Positive for fatigue. Negative for activity change, appetite change, diaphoresis and fever.  HENT:  Positive for postnasal drip and rhinorrhea.   Respiratory:  Positive for shortness of breath  (sometimes). Negative for cough, chest tightness and wheezing.   Cardiovascular:  Negative for chest pain, palpitations and leg swelling.  Gastrointestinal: Negative.   Endocrine: Negative for polydipsia, polyphagia and polyuria.  Neurological: Negative.   Psychiatric/Behavioral: Negative.      Per HPI unless specifically indicated above     Objective:     BP 127/78   Pulse 77   Temp 98 F (36.7 C) (Oral)   Ht 5' 3 (1.6 m)   Wt 181 lb (82.1 kg)   LMP  (LMP Unknown)   SpO2 96%   BMI 32.06 kg/m   Wt Readings from Last 3 Encounters:  08/19/23 181 lb (82.1 kg)  08/17/23 181 lb 14.4 oz (82.5 kg)  07/22/23 180 lb 4.8 oz (81.8 kg)    Physical Exam Vitals and nursing note reviewed.  Constitutional:      General: She is awake. She is not in acute distress.    Appearance: She is well-developed and well-groomed. She is obese. She is not ill-appearing or toxic-appearing.  HENT:     Head: Normocephalic.     Right Ear: Hearing, ear canal and external ear normal. A middle ear effusion is present. There is no impacted cerumen. Tympanic membrane is not injected.     Left Ear: Hearing, ear canal and external ear normal. A middle ear effusion is present. There is no impacted cerumen. Tympanic membrane is not injected.     Nose: No rhinorrhea.     Right Sinus: No maxillary sinus tenderness or frontal sinus  tenderness.     Left Sinus: No maxillary sinus tenderness or frontal sinus tenderness.     Mouth/Throat:     Mouth: Mucous membranes are moist.     Pharynx: Posterior oropharyngeal erythema (cobblestone pattern) present. No pharyngeal swelling or oropharyngeal exudate.  Eyes:     General: Lids are normal.        Right eye: No discharge.        Left eye: No discharge.     Conjunctiva/sclera: Conjunctivae normal.     Pupils: Pupils are equal, round, and reactive to light.  Neck:     Thyroid : No thyromegaly.     Vascular: No carotid bruit.  Cardiovascular:     Rate and Rhythm:  Normal rate and regular rhythm.     Heart sounds: Normal heart sounds. No murmur heard.    No gallop.  Pulmonary:     Effort: Pulmonary effort is normal. No accessory muscle usage or respiratory distress.     Breath sounds: Normal breath sounds. No decreased breath sounds, wheezing or rales.  Abdominal:     General: Bowel sounds are normal. There is no distension.     Palpations: Abdomen is soft.     Tenderness: There is no abdominal tenderness.  Musculoskeletal:     Cervical back: Normal range of motion and neck supple.     Right lower leg: No edema.     Left lower leg: No edema.  Lymphadenopathy:     Cervical: No cervical adenopathy.  Skin:    General: Skin is warm and dry.  Neurological:     Mental Status: She is alert and oriented to person, place, and time.     Deep Tendon Reflexes: Reflexes are normal and symmetric.     Reflex Scores:      Brachioradialis reflexes are 2+ on the right side and 2+ on the left side.      Patellar reflexes are 2+ on the right side and 2+ on the left side. Psychiatric:        Attention and Perception: Attention normal.        Mood and Affect: Mood normal.        Speech: Speech normal.        Behavior: Behavior normal. Behavior is cooperative.        Thought Content: Thought content normal.    Results for orders placed or performed in visit on 08/17/23  CMP ([facility]cer Center only)   Collection Time: 08/17/23 10:27 AM  Result Value Ref Range   Sodium 139 135 - 145 mmol/L   Potassium 3.2 (L) 3.5 - 5.1 mmol/L   Chloride 106 98 - 111 mmol/L   CO2 22 22 - 32 mmol/L   Glucose, Bld 153 (H) 70 - 99 mg/dL   BUN 16 6 - 20 mg/dL   Creatinine 8.29 5.62 - 1.00 mg/dL   Calcium  8.7 (L) 8.9 - 10.3 mg/dL   Total Protein 7.3 6.5 - 8.1 g/dL   Albumin  4.2 3.5 - 5.0 g/dL   AST 28 15 - 41 U/L   ALT 19 0 - 44 U/L   Alkaline Phosphatase 80 38 - 126 U/L   Total Bilirubin 1.2 0.0 - 1.2 mg/dL   GFR, Estimated >13 >08 mL/min   Anion gap 11 5 - 15  CBC with  Differential (Cancer Center Only)   Collection Time: 08/17/23 10:27 AM  Result Value Ref Range   WBC Count 6.1 4.0 - 10.5 K/uL   RBC 5.05 3.87 -  5.11 MIL/uL   Hemoglobin 14.2 12.0 - 15.0 g/dL   HCT 16.1 09.6 - 04.5 %   MCV 85.9 80.0 - 100.0 fL   MCH 28.1 26.0 - 34.0 pg   MCHC 32.7 30.0 - 36.0 g/dL   RDW 40.9 81.1 - 91.4 %   Platelet Count 310 150 - 400 K/uL   nRBC 0.0 0.0 - 0.2 %   Neutrophils Relative % 55 %   Neutro Abs 3.4 1.7 - 7.7 K/uL   Lymphocytes Relative 37 %   Lymphs Abs 2.2 0.7 - 4.0 K/uL   Monocytes Relative 5 %   Monocytes Absolute 0.3 0.1 - 1.0 K/uL   Eosinophils Relative 1 %   Eosinophils Absolute 0.1 0.0 - 0.5 K/uL   Basophils Relative 1 %   Basophils Absolute 0.0 0.0 - 0.1 K/uL   Immature Granulocytes 1 %   Abs Immature Granulocytes 0.04 0.00 - 0.07 K/uL      Assessment & Plan:   Problem List Items Addressed This Visit       Cardiovascular and Mediastinum   Right ventricular failure [facility])   Diagnosed October 2024 with underlying pulmonary hypertension.  Continue Tadalafil  as ordered and collaboration with specialists, all recent notes reviewed.      Pulmonary hypertension, moderate to severe [facility]) - Primary   Diagnosed October 2024. Will continue medications as ordered by specialists. Will continue collaboration with patient and care team.  Recent labs/notes reviewed.      Relevant Orders   Microalbumin, Urine Waived   Magnesium    Essential (primary) hypertension   Chronic, ongoing.  BP at goal in office today.  Recommend she monitor BP at least a few mornings a week at home and document.  DASH diet at home.  Continue current medication regimen.  Labs: CMP and urine ALB.  Urine ALB 80 April 2024.        Relevant Orders   Magnesium      Endocrine   Hashimoto's thyroiditis   Chronic, ongoing. Continue current medication regimen and adjust as needed based on labs.  Continue collaboration with endocrinology.  Recent notes reviewed.        Other    Swelling of face   Minimal under eyelids noted today, suspect related to underlying allergies.  Recommend she continue Flonase , but also restart Zyrtec  which she has at home. Take daily for the next 3-4 weeks.  Will check labs today.      Relevant Orders   Allergens(6) Grasses   Allergens (8) Trees   Allergens(14)   Allergy Panel, Animal [employer]   Prediabetes   Noted on past labs, check level today and recommend diet focus.  Level 5.7% last visit, highly recommend diet and exercise focus.      Relevant Orders   Bayer DCA Hb A1c Waived   Microalbumin, Urine Waived   Obesity   BMI 32.06.  Recommended eating smaller high protein, low fat meals more frequently and exercising 30 mins a day 5 times a week with a goal of 10-15lb weight loss in the next 3 months. Patient voiced their understanding and motivation to adhere to these recommendations.      Hyperlipidemia   Chronic, ongoing.  Recommend continued focus on diet and regular activity. Lipid panel today, discussed with her possible statin initiation.   The 10-year ASCVD risk score (Arnett DK, et al., 2019) is: 3.1%   Values used to calculate the score:     Age: 52 years     Sex: Female  Is Non-Hispanic African American: No     Diabetic: No     Tobacco smoker: No     Systolic Blood Pressure: 127 mmHg     Is BP treated: Yes     HDL Cholesterol: 65 mg/dL     Total Cholesterol: 226 mg/dL       Relevant Orders   Comprehensive metabolic panel with GFR   Lipid Panel w/o Chol/HDL Ratio   Other Visit Diagnoses       Vitamin D deficiency       Check levels today per patient request.   Relevant Orders   VITAMIN D 25 Hydroxy (Vit-D Deficiency, Fractures)     Vitamin B12 deficiency       Check levels today per patient request.   Relevant Orders   Vitamin B12     Vitamin C  deficiency       Check levels today per patient request.   Relevant Orders   Vitamin C         Follow up plan: Return in about 6 months (around  02/18/2024) for PULMONARY HTN, CHF, THYROID .

## 2023-08-20 ENCOUNTER — Ambulatory Visit: Payer: Self-pay | Admitting: Nurse Practitioner

## 2023-08-20 ENCOUNTER — Encounter

## 2023-08-20 DIAGNOSIS — Z419 Encounter for procedure for purposes other than remedying health state, unspecified: Secondary | ICD-10-CM | POA: Diagnosis not present

## 2023-08-20 NOTE — Progress Notes (Signed)
 Contacted via MyChart  Good morning Tonya Odonnell, your labs are slowly returning, some are still pending: - Kidney function, creatinine and eGFR, remains normal, as is liver function, AST and ALT.  - Calcium  level improved. - Lipid panel shows ongoing elevations, with your pulmonary hypertension and heart failure you would benefit starting statin therapy to help lower levels and prevent stroke or heart attack.  Do you want to try a low dose statin? - Magnesium , B12, and Vitamin D stable.  Waiting on allergy testing and Vitamin C .  Any questions? Keep being amazing!!  Thank you for allowing me to participate in your care.  I appreciate you. Kindest regards, Santhiago Collingsworth

## 2023-08-21 DIAGNOSIS — Z7689 Persons encountering health services in other specified circumstances: Secondary | ICD-10-CM | POA: Diagnosis not present

## 2023-08-22 DIAGNOSIS — Z7689 Persons encountering health services in other specified circumstances: Secondary | ICD-10-CM | POA: Diagnosis not present

## 2023-08-23 LAB — COMPREHENSIVE METABOLIC PANEL WITH GFR
ALT: 15 IU/L (ref 0–32)
AST: 17 IU/L (ref 0–40)
Albumin: 4.5 g/dL (ref 3.8–4.9)
Alkaline Phosphatase: 96 IU/L (ref 44–121)
BUN/Creatinine Ratio: 24 — ABNORMAL HIGH (ref 9–23)
BUN: 18 mg/dL (ref 6–24)
Bilirubin Total: 0.8 mg/dL (ref 0.0–1.2)
CO2: 19 mmol/L — ABNORMAL LOW (ref 20–29)
Calcium: 9.6 mg/dL (ref 8.7–10.2)
Chloride: 107 mmol/L — ABNORMAL HIGH (ref 96–106)
Creatinine, Ser: 0.76 mg/dL (ref 0.57–1.00)
Globulin, Total: 2.4 g/dL (ref 1.5–4.5)
Glucose: 85 mg/dL (ref 70–99)
Potassium: 4.2 mmol/L (ref 3.5–5.2)
Sodium: 142 mmol/L (ref 134–144)
Total Protein: 6.9 g/dL (ref 6.0–8.5)
eGFR: 91 mL/min/{1.73_m2} (ref >59)

## 2023-08-23 LAB — ALLERGENS(14)
Allergen Corn, IgE: <0.1 kU/L
Allergen Salmon IgE: <0.1 kU/L
Beef IgE: <0.1 kU/L
Chocolate/Cacao IgE: <0.1 kU/L
Codfish IgE: <0.1 kU/L
Egg, Whole IgE: <0.1 kU/L
F037-IgE Mussel: <0.1 kU/L
Milk IgE: 0.1 kU/L — AB
Peanut IgE: <0.1 kU/L
Pork IgE: <0.1 kU/L
Shrimp IgE: 0.11 kU/L — AB
Soybean IgE: <0.1 kU/L
Tuna: <0.1 kU/L
Wheat IgE: <0.1 kU/L

## 2023-08-23 LAB — LIPID PANEL W/O CHOL/HDL RATIO
Cholesterol, Total: 276 mg/dL — ABNORMAL HIGH (ref 100–199)
HDL: 73 mg/dL (ref >39)
LDL Chol Calc (NIH): 185 mg/dL — ABNORMAL HIGH (ref 0–99)
Triglycerides: 107 mg/dL (ref 0–149)
VLDL Cholesterol Cal: 18 mg/dL (ref 5–40)

## 2023-08-23 LAB — ALLERGENS(6) GRASSES
Bahia Grass IgE: <0.1 kU/L
Bermuda Grass IgE: <0.1 kU/L
Johnson Grass IgE: <0.1 kU/L
Kentucky Bluegrass IgE: <0.1 kU/L
Rye Grass, Perennial IgE: <0.1 kU/L
Timothy Grass IgE: <0.1 kU/L

## 2023-08-23 LAB — ALLERGENS (8) TREES
Amer Sycamore IgE Qn: <0.1 kU/L
Cottonwood IgE: <0.1 kU/L
Pine, White IgE: <0.1 kU/L
T010-IgE Walnut: <0.1 kU/L
T012-IgE Willow: <0.1 kU/L
T015-IgE Ash, White: <0.1 kU/L
T044-IgE Hackberry: <0.1 kU/L
T210-IgE Privet, Common: <0.1 kU/L

## 2023-08-23 LAB — ALLERGY PANEL, ANIMAL GROUP
Chicken Feathers IgE: <0.1 kU/L
Cow Dander IgE: <0.1 kU/L
Goose Feathers IgE: <0.1 kU/L
Horse dander: 0.36 kU/L — AB
Mouse Urine IgE: <0.1 kU/L

## 2023-08-23 LAB — MAGNESIUM: Magnesium: 2.1 mg/dL (ref 1.6–2.3)

## 2023-08-23 LAB — VITAMIN B12: Vitamin B-12: 648 pg/mL (ref 232–1245)

## 2023-08-23 LAB — VITAMIN C: Vitamin C: 0.6 mg/dL (ref 0.4–2.0)

## 2023-08-23 LAB — VITAMIN D 25 HYDROXY (VIT D DEFICIENCY, FRACTURES): Vit D, 25-Hydroxy: 34.6 ng/mL (ref 30.0–100.0)

## 2023-08-24 ENCOUNTER — Encounter: Payer: Self-pay | Admitting: Nurse Practitioner

## 2023-08-25 ENCOUNTER — Encounter: Admitting: *Deleted

## 2023-08-25 DIAGNOSIS — Z7689 Persons encountering health services in other specified circumstances: Secondary | ICD-10-CM | POA: Diagnosis not present

## 2023-08-25 DIAGNOSIS — I272 Pulmonary hypertension, unspecified: Secondary | ICD-10-CM | POA: Diagnosis not present

## 2023-08-25 NOTE — Progress Notes (Signed)
 Daily Session Note  Patient Details  Name: Tonya Odonnell MRN: [id number] Date of Birth: 1966/01/14 Referring Provider:   Flowsheet Row Pulmonary Rehab from 07/22/2023 in [facility] Cardiac and Pulmonary Rehab  Referring Provider Peder Bourdon, MD    Encounter Date: 08/25/2023  Check In:  Session Check In - 08/25/23 1128       Check-In   Supervising physician immediately available to respond to emergencies See telemetry face sheet for immediately available ER MD    Location [facility]-Cardiac & Pulmonary Rehab    Staff Present Maud Sorenson, RN, BSN, CCRP;Joseph Hood RCP,RRT,BSRT;Maxon Hartley BS, Exercise Physiologist;Noah Tickle, BS, Exercise Physiologist;Jason Martina Sledge RDN,LDN    Virtual Visit No    Medication changes reported     No    Fall or balance concerns reported    No    Warm-up and Cool-down Performed on first and last piece of equipment    Resistance Training Performed Yes    VAD Patient? No    PAD/SET Patient? No      Pain Assessment   Currently in Pain? No/denies             Social History   Tobacco Use  Smoking Status Never  Smokeless Tobacco Never    Goals Met:  Proper associated with RPD/PD & O2 Sat Independence with exercise equipment Exercise tolerated well No report of concerns or symptoms today  Goals Unmet:  Not Applicable  Comments: Pt able to follow exercise prescription today without complaint.  Will continue to monitor for progression.    Dr. Firman Hughes is Medical Director for [facility] Cardiac Rehabilitation.  Dr. Fuad Aleskerov is Medical Director for [facility] Pulmonary Rehabilitation.

## 2023-08-26 ENCOUNTER — Other Ambulatory Visit: Payer: Self-pay

## 2023-08-27 ENCOUNTER — Encounter: Admitting: *Deleted

## 2023-08-27 DIAGNOSIS — I272 Pulmonary hypertension, unspecified: Secondary | ICD-10-CM

## 2023-08-27 DIAGNOSIS — Z7689 Persons encountering health services in other specified circumstances: Secondary | ICD-10-CM | POA: Diagnosis not present

## 2023-08-27 NOTE — Progress Notes (Signed)
 Daily Session Note  Patient Details  Name: Tonya Odonnell MRN: [id number] Date of Birth: 12/10/1965 Referring Provider:   Flowsheet Row Pulmonary Rehab from 07/22/2023 in [facility] Cardiac and Pulmonary Rehab  Referring Provider Peder Bourdon, MD    Encounter Date: 08/27/2023  Check In:  Session Check In - 08/27/23 1109       Check-In   Supervising physician immediately available to respond to emergencies See telemetry face sheet for immediately available ER MD    Location [facility]-Cardiac & Pulmonary Rehab    Staff Present Maud Sorenson, RN, BSN, CCRP;Joseph Hood RCP,RRT,BSRT;Maxon Bingham BS, Exercise Physiologist;Krista Spencer RN,BSN    Virtual Visit No    Medication changes reported     No    Fall or balance concerns reported    No    Warm-up and Cool-down Performed on first and last piece of equipment    Resistance Training Performed Yes    VAD Patient? No    PAD/SET Patient? No      Pain Assessment   Currently in Pain? No/denies             Social History   Tobacco Use  Smoking Status Never  Smokeless Tobacco Never    Goals Met:  Proper associated with RPD/PD & O2 Sat Independence with exercise equipment Exercise tolerated well No report of concerns or symptoms today  Goals Unmet:  Not Applicable  Comments: Pt able to follow exercise prescription today without complaint.  Will continue to monitor for progression.    Dr. Firman Hughes is Medical Director for [facility] Cardiac Rehabilitation.  Dr. Fuad Aleskerov is Medical Director for [facility] Pulmonary Rehabilitation.

## 2023-08-28 ENCOUNTER — Emergency Department (HOSPITAL_COMMUNITY)

## 2023-08-28 ENCOUNTER — Emergency Department (HOSPITAL_COMMUNITY)
Admission: EM | Admit: 2023-08-28 | Discharge: 2023-08-28 | Disposition: A | Discharge status: Home / Self Care | Attending: Emergency Medicine | Admitting: Emergency Medicine

## 2023-08-28 ENCOUNTER — Telehealth (HOSPITAL_COMMUNITY): Payer: Self-pay | Admitting: Cardiology

## 2023-08-28 ENCOUNTER — Other Ambulatory Visit: Payer: Self-pay

## 2023-08-28 DIAGNOSIS — Z79899 Other long term (current) drug therapy: Secondary | ICD-10-CM | POA: Insufficient documentation

## 2023-08-28 DIAGNOSIS — Z853 Personal history of malignant neoplasm of breast: Secondary | ICD-10-CM | POA: Insufficient documentation

## 2023-08-28 DIAGNOSIS — R4182 Altered mental status, unspecified: Secondary | ICD-10-CM | POA: Diagnosis not present

## 2023-08-28 DIAGNOSIS — I639 Cerebral infarction, unspecified: Secondary | ICD-10-CM

## 2023-08-28 DIAGNOSIS — I10 Essential (primary) hypertension: Secondary | ICD-10-CM | POA: Insufficient documentation

## 2023-08-28 DIAGNOSIS — R29818 Other symptoms and signs involving the nervous system: Secondary | ICD-10-CM | POA: Diagnosis not present

## 2023-08-28 DIAGNOSIS — I272 Pulmonary hypertension, unspecified: Secondary | ICD-10-CM

## 2023-08-28 DIAGNOSIS — Z79890 Hormone replacement therapy (postmenopausal): Secondary | ICD-10-CM | POA: Insufficient documentation

## 2023-08-28 DIAGNOSIS — R42 Dizziness and giddiness: Secondary | ICD-10-CM

## 2023-08-28 DIAGNOSIS — E039 Hypothyroidism, unspecified: Secondary | ICD-10-CM | POA: Insufficient documentation

## 2023-08-28 DIAGNOSIS — H8110 Benign paroxysmal vertigo, unspecified ear: Secondary | ICD-10-CM

## 2023-08-28 LAB — COMPREHENSIVE METABOLIC PANEL WITH GFR
ALT: 18 U/L (ref 0–44)
AST: 34 U/L (ref 15–41)
Albumin: 4.2 g/dL (ref 3.5–5.0)
Alkaline Phosphatase: 73 U/L (ref 38–126)
Anion gap: 12 (ref 5–15)
BUN: 21 mg/dL — ABNORMAL HIGH (ref 6–20)
CO2: 19 mmol/L — ABNORMAL LOW (ref 22–32)
Calcium: 9.4 mg/dL (ref 8.9–10.3)
Chloride: 106 mmol/L (ref 98–111)
Creatinine, Ser: 0.81 mg/dL (ref 0.44–1.00)
GFR, Estimated: >60 mL/min (ref >60)
Glucose, Bld: 97 mg/dL (ref 70–99)
Potassium: 4.5 mmol/L (ref 3.5–5.1)
Sodium: 137 mmol/L (ref 135–145)
Total Bilirubin: 0.8 mg/dL (ref 0.0–1.2)
Total Protein: 7.5 g/dL (ref 6.5–8.1)

## 2023-08-28 LAB — PROTIME-INR
INR: 0.9 (ref 0.8–1.2)
Prothrombin Time: 11.9 s (ref 11.4–15.2)

## 2023-08-28 LAB — DIFFERENTIAL
Abs Immature Granulocytes: 0.06 10*3/uL (ref 0.00–0.07)
Basophils Absolute: 0.1 10*3/uL (ref 0.0–0.1)
Basophils Relative: 1 %
Eosinophils Absolute: 0.1 10*3/uL (ref 0.0–0.5)
Eosinophils Relative: 1 %
Immature Granulocytes: 1 %
Lymphocytes Relative: 36 %
Lymphs Abs: 3.2 10*3/uL (ref 0.7–4.0)
Monocytes Absolute: 0.6 10*3/uL (ref 0.1–1.0)
Monocytes Relative: 7 %
Neutro Abs: 5 10*3/uL (ref 1.7–7.7)
Neutrophils Relative %: 54 %

## 2023-08-28 LAB — RAPID URINE DRUG SCREEN, HOSP PERFORMED
Amphetamines: NOT DETECTED
Barbiturates: NOT DETECTED
Benzodiazepines: NOT DETECTED
Cocaine: NOT DETECTED
Opiates: NOT DETECTED
Tetrahydrocannabinol: NOT DETECTED

## 2023-08-28 LAB — CBC
HCT: 46.7 % — ABNORMAL HIGH (ref 36.0–46.0)
Hemoglobin: 15.6 g/dL — ABNORMAL HIGH (ref 12.0–15.0)
MCH: 28.7 pg (ref 26.0–34.0)
MCHC: 33.4 g/dL (ref 30.0–36.0)
MCV: 85.8 fL (ref 80.0–100.0)
Platelets: 314 10*3/uL (ref 150–400)
RBC: 5.44 MIL/uL — ABNORMAL HIGH (ref 3.87–5.11)
RDW: 13.5 % (ref 11.5–15.5)
WBC: 9 10*3/uL (ref 4.0–10.5)
nRBC: 0 % (ref 0.0–0.2)

## 2023-08-28 LAB — I-STAT CHEM 8, ED
BUN: 27 mg/dL — ABNORMAL HIGH (ref 6–20)
Calcium, Ion: 1.11 mmol/L — ABNORMAL LOW (ref 1.15–1.40)
Chloride: 106 mmol/L (ref 98–111)
Creatinine, Ser: 0.8 mg/dL (ref 0.44–1.00)
Glucose, Bld: 99 mg/dL (ref 70–99)
HCT: 46 % (ref 36.0–46.0)
Hemoglobin: 15.6 g/dL — ABNORMAL HIGH (ref 12.0–15.0)
Potassium: 4.1 mmol/L (ref 3.5–5.1)
Sodium: 139 mmol/L (ref 135–145)
TCO2: 22 mmol/L (ref 22–32)

## 2023-08-28 LAB — APTT: aPTT: 29 s (ref 24–36)

## 2023-08-28 LAB — ETHANOL: Alcohol, Ethyl (B): <15 mg/dL (ref <15)

## 2023-08-28 LAB — CBG MONITORING, ED: Glucose-Capillary: 104 mg/dL — ABNORMAL HIGH (ref 70–99)

## 2023-08-28 MED ORDER — MECLIZINE HCL 25 MG PO TABS: 25.0000 mg | ORAL_TABLET | Freq: Three times a day (TID) | ORAL | 0 refills | Status: AC | PRN

## 2023-08-28 MED ORDER — IOHEXOL 350 MG/ML SOLN
75.0000 mL | Freq: Once | INTRAVENOUS | Status: AC | PRN
  Administered 2023-08-28: 75 mL via INTRAVENOUS

## 2023-08-28 MED ORDER — IOHEXOL 350 MG/ML SOLN: 75.0000 mL | Freq: Once | INTRAVENOUS | Status: DC | PRN

## 2023-08-28 MED ORDER — ONDANSETRON HCL 4 MG PO TABS
4.0000 mg | ORAL_TABLET | Freq: Three times a day (TID) | ORAL | 0 refills | Status: AC | PRN
Start: 2023-08-28 — End: ?

## 2023-08-28 MED ORDER — MECLIZINE HCL 25 MG PO TABS
25.0000 mg | ORAL_TABLET | Freq: Three times a day (TID) | ORAL | Status: DC | PRN
  Administered 2023-08-28: 25 mg via ORAL
  Filled 2023-08-28: qty 1

## 2023-08-28 MED ORDER — LORAZEPAM 2 MG/ML IJ SOLN: 1.0000 mg | Freq: Once | INTRAMUSCULAR | Status: DC

## 2023-08-28 NOTE — ED Notes (Signed)
 [facility] Code Stroke Called: @ 1846 LKW 1630 DEF: gait off, AMS, unable to follow commands.NO LVO+. AUTH: PA SCOTT

## 2023-08-28 NOTE — ED Notes (Signed)
Assisted patient with walking to the restroom.

## 2023-08-28 NOTE — Telephone Encounter (Signed)
 Patient called to report dizziness 30-40 mins ago. Reports she is having hard time ambulation through home, feels like she is drunk  B/P ~5 min ago 129/80 B/P at the time of call 141/95 HR 105  All AM meds taken No weight today, weight at PT 179 lbs Denies CP or SOB  Reports visual changes (everything flying around)

## 2023-08-28 NOTE — Telephone Encounter (Signed)
 pt aware

## 2023-08-28 NOTE — ED Provider Notes (Signed)
 [facility] EMERGENCY DEPARTMENT AT [facility] Provider Note   CSN: [id number] Arrival date & time: 08/28/23  8165     Patient presents with: No chief complaint on file.   Tonya Odonnell is a 58 y.o. female.  She is brought in by family for dizziness weakness unable to speak to triage.  Code stroke was activated and patient was taken to CAT scan and evaluated by neurology.  She is currently returned from CAT scan.  Started before 6pm possibly earlier.  She called her son asking to be taken to the emergency department.  He found her weak and she said she had fallen once.  He was able to assist her to the bathroom and then into the car.  He said she was able to speak at that time.  On presentation to triage she was unable to speak.   The history is provided by the patient and a relative.  Weakness Severity:  Severe Onset quality:  Gradual Duration:  2 hours Timing:  Constant Progression:  Worsening Chronicity:  New Relieved by:  Nothing Associated symptoms: aphasia, difficulty walking, falls and stroke symptoms   Associated symptoms: no abdominal pain, no chest pain, no dysuria, no fever and no shortness of breath        Prior to Admission medications   Medication Sig Start Date End Date Taking? Authorizing Provider  ambrisentan  (LETAIRIS ) 10 MG tablet Take 1 tablet (10 mg total) by mouth daily. 04/09/23   Rolan Ezra RAMAN, MD  Azelaic Acid  15 % gel Apply to face every morning 03/16/23   Stewart, Tara, MD  bismuth subsalicylate (PEPTO BISMOL) 262 MG/15ML suspension Take 30 mLs by mouth every 6 (six) hours as needed for diarrhea or loose stools.    [provider]  clobetasol  cream (TEMOVATE ) 0.05 % Apply 1 Application topically as needed. 05/19/23   Cannady, Jolene T, NP  cromolyn  (OPTICROM ) 4 % ophthalmic solution Place 1 drop into both eyes 4 (four) times daily. Patient taking differently: Place 1 drop into both eyes daily as needed (Dry eye). 12/10/22   Cannady,  Jolene T, NP  ELIDEL  1 % cream Apply to red scaly areas on the face BID for seb derm 03/16/23   Jackquline Sawyer, MD  fluticasone  (FLONASE ) 50 MCG/ACT nasal spray Place 2 sprays into both nostrils daily as needed for allergies or rhinitis.    [provider]  furosemide  (LASIX ) 40 MG tablet Take 1 tablet (40 mg total) by mouth daily. 05/20/23   Rolan Ezra RAMAN, MD  hydrALAZINE  (APRESOLINE ) 25 MG tablet Take 1 tablet (25 mg total) by mouth 2 (two) times daily. 05/20/23   Rolan Ezra RAMAN, MD  Ivermectin  (SOOLANTRA ) 1 % CREA Apply 1 Application topically at bedtime. Patient not taking: Reported on 06/30/2023 03/16/23   Jackquline Sawyer, MD  levothyroxine  (SYNTHROID ) 112 MCG tablet Take 1 pill everyday except Sunday take half a pill 06/04/23   Dartha Ernst, MD  omeprazole  (PRILOSEC) 40 MG capsule Take 40 mg by mouth daily as needed (Heartburn).    [provider]  potassium chloride  SA (KLOR-CON  M) 20 MEQ tablet Take 1 tablet (20 mEq total) by mouth daily. 05/20/23   Rolan Ezra RAMAN, MD  Selexipag  800 MCG TABS Take 1 tablet (800 mcg total) by mouth 2 (two) times daily. Patient taking differently: Take 800 mcg by mouth 2 (two) times daily. Uptravi  06/05/23   Rolan Ezra RAMAN, MD  tadalafil , PAH, (ADCIRCA ) 20 MG tablet Take 2 tablets (40 mg  total) by mouth daily. NDC's covered by plan: 66657972190, [id number], [id number] 02/02/23   Rolan Ezra RAMAN, MD  valACYclovir  (VALTREX ) 1000 MG tablet Take 1,000 mg by mouth daily as needed (Cold Sores).    [provider]    Allergies: Lisinopril    Review of Systems  Constitutional:  Negative for fever.  Respiratory:  Negative for shortness of breath.   Cardiovascular:  Negative for chest pain.  Gastrointestinal:  Negative for abdominal pain.  Genitourinary:  Negative for dysuria.  Musculoskeletal:  Positive for falls.  Neurological:  Positive for speech difficulty and weakness. Negative for numbness.    Updated Vital Signs BP (!)  174/112 (BP Location: Right Arm)   Pulse 98   Temp 98.2 F (36.8 C)   Resp 19   LMP  (LMP Unknown)   SpO2 99%   Physical Exam Vitals and nursing note reviewed.  Constitutional:      General: She is not in acute distress.    Appearance: Normal appearance. She is well-developed.  HENT:     Head: Normocephalic and atraumatic.   Eyes:     Conjunctiva/sclera: Conjunctivae normal.    Cardiovascular:     Rate and Rhythm: Normal rate and regular rhythm.     Heart sounds: No murmur heard. Pulmonary:     Effort: Pulmonary effort is normal. No respiratory distress.     Breath sounds: Normal breath sounds. No stridor. No wheezing.  Abdominal:     Palpations: Abdomen is soft.     Tenderness: There is no abdominal tenderness. There is no guarding or rebound.   Musculoskeletal:        General: No tenderness or deformity. Normal range of motion.     Cervical back: Neck supple.   Skin:    General: Skin is warm and dry.   Neurological:     General: No focal deficit present.     Mental Status: She is alert.     GCS: GCS eye subscore is 4. GCS verbal subscore is 5. GCS motor subscore is 6.     Cranial Nerves: No cranial nerve deficit.     Sensory: No sensory deficit.     Motor: Weakness present.     (all labs ordered are listed, but only abnormal results are displayed) Labs Reviewed  CBC - Abnormal; Notable for the following components:      Result Value   RBC 5.44 (*)    Hemoglobin 15.6 (*)    HCT 46.7 (*)    All other components within normal limits  COMPREHENSIVE METABOLIC PANEL WITH GFR - Abnormal; Notable for the following components:   CO2 19 (*)    BUN 21 (*)    All other components within normal limits  I-STAT CHEM 8, ED - Abnormal; Notable for the following components:   BUN 27 (*)    Calcium , Ion 1.11 (*)    Hemoglobin 15.6 (*)    All other components within normal limits  CBG MONITORING, ED - Abnormal; Notable for the following components:   Glucose-Capillary  104 (*)    All other components within normal limits  ETHANOL  PROTIME-INR  APTT  DIFFERENTIAL  RAPID URINE DRUG SCREEN, HOSP PERFORMED    EKG: None EKG not crossing into epic.  Normal sinus rhythm rate of 74 nonspecific and ventricular conduction delay no acute changes from prior  Radiology: MR BRAIN WO CONTRAST Result Date: 08/28/2023 CLINICAL DATA:  Acute neurologic deficit EXAM: MRI HEAD WITHOUT CONTRAST TECHNIQUE: Multiplanar, multiecho pulse  sequences of the brain and surrounding structures were obtained without intravenous contrast. COMPARISON:  08/28/2023 head CT FINDINGS: Brain: No acute infarct, mass effect or extra-axial collection. No acute or chronic hemorrhage. Normal white matter signal, parenchymal volume and CSF spaces. The midline structures are normal. Vascular: Normal flow voids. Skull and upper cervical spine: Normal calvarium and skull base. Visualized upper cervical spine and soft tissues are normal. Sinuses/Orbits:No paranasal sinus fluid levels or advanced mucosal thickening. No mastoid or middle ear effusion. Normal orbits. IMPRESSION: Normal brain MRI. Electronically Signed   By: Franky Stanford M.D.   On: 08/28/2023 22:57   CT ANGIO HEAD NECK W WO CM (CODE STROKE) Result Date: 08/28/2023 CLINICAL DATA:  Altered mental status and dizziness EXAM: CT ANGIOGRAPHY HEAD AND NECK WITH AND WITHOUT CONTRAST TECHNIQUE: Multidetector CT imaging of the head and neck was performed using the standard protocol during bolus administration of intravenous contrast. Multiplanar CT image reconstructions and MIPs were obtained to evaluate the vascular anatomy. Carotid stenosis measurements (when applicable) are obtained utilizing NASCET criteria, using the distal internal carotid diameter as the denominator. RADIATION DOSE REDUCTION: This exam was performed according to the departmental dose-optimization program which includes automated exposure control, adjustment of the mA and/or kV according  to patient size and/or use of iterative reconstruction technique. CONTRAST:  75mL OMNIPAQUE  IOHEXOL  350 MG/ML SOLN COMPARISON:  Head CT 08/28/2023 FINDINGS: CTA NECK FINDINGS Skeleton: No acute abnormality or high grade bony spinal canal stenosis. Other neck: Normal pharynx, larynx and major salivary glands. No cervical lymphadenopathy. Unremarkable thyroid  gland. Upper chest: No pneumothorax or pleural effusion. No nodules or masses. Aortic arch: There is no calcific atherosclerosis of the aortic arch. Conventional 3 vessel aortic branching pattern. RIGHT carotid system: Normal without aneurysm, dissection or stenosis. LEFT carotid system: Normal without aneurysm, dissection or stenosis. Vertebral arteries: Left dominant configuration. There is no dissection, occlusion or flow-limiting stenosis to the skull base (V1-V3 segments). CTA HEAD FINDINGS POSTERIOR CIRCULATION: Vertebral arteries are normal. No proximal occlusion of the anterior or inferior cerebellar arteries. Basilar artery is normal. Superior cerebellar arteries are normal. Posterior cerebral arteries are normal. ANTERIOR CIRCULATION: Intracranial internal carotid arteries are normal. Anterior cerebral arteries are normal. Middle cerebral arteries are normal. Venous sinuses: As permitted by contrast timing, patent. Anatomic variants: None Review of the MIP images confirms the above findings. IMPRESSION: Normal CTA of the head and neck. Electronically Signed   By: Franky Stanford M.D.   On: 08/28/2023 19:19   CT HEAD CODE STROKE WO CONTRAST Result Date: 08/28/2023 CLINICAL DATA:  Code stroke. Neuro deficit, concern for stroke, altered mental status. EXAM: CT HEAD WITHOUT CONTRAST TECHNIQUE: Contiguous axial images were obtained from the base of the skull through the vertex without intravenous contrast. RADIATION DOSE REDUCTION: This exam was performed according to the departmental dose-optimization program which includes automated exposure control,  adjustment of the mA and/or kV according to patient size and/or use of iterative reconstruction technique. COMPARISON:  CT head 12/31/2022. FINDINGS: Brain: No acute intracranial hemorrhage. No CT evidence of acute infarct. No edema, mass effect, or midline shift. The basilar cisterns are patent. Ventricles: The ventricles are normal. Vascular: No hyperdense vessel or unexpected calcification. Skull: No acute or aggressive finding. Orbits: Orbits are symmetric. Sinuses: Mild mucosal thickening in the ethmoid sinuses. Mucous retention cyst in the left maxillary sinus. Other: Mastoid air cells are clear. ASPECTS [facility] Stroke Program Early CT Score) - Ganglionic level infarction (caudate, lentiform nuclei, internal capsule, insula, M1-M3 cortex): 7 -  Supraganglionic infarction (M4-M6 cortex): 3 Total score (0-10 with 10 being normal): 10 IMPRESSION: 1. No CT evidence of acute intracranial abnormality. 2. ASPECTS is 10 These results were communicated to Dr. Jerrie at 7:10 pm on 08/28/2023 by text page via the [facility] messaging system. Electronically Signed   By: Donnice Mania M.D.   On: 08/28/2023 19:10     Procedures   Medications Ordered in the ED  iohexol  (OMNIPAQUE ) 350 MG/ML injection 75 mL ( Intravenous Canceled Entry 08/28/23 1910)  LORazepam  (ATIVAN ) injection 1 mg (has no administration in time range)  meclizine  (ANTIVERT ) tablet 25 mg (has no administration in time range)  iohexol  (OMNIPAQUE ) 350 MG/ML injection 75 mL (75 mLs Intravenous Contrast Given 08/28/23 1910)    Clinical Course as of 08/28/23 2306  Fri Aug 28, 2023  1924 Patient was seen and evaluated by neurology Dr. Vanessa.  TEE and CTA negative for acute.  He is recommending proceeding with MRI brain. [MB]    Clinical Course User Index [MB] Towana Ozell BROCKS, MD                                 Medical Decision Making Amount and/or Complexity of Data Reviewed Labs: ordered. Radiology: ordered.  Risk Prescription drug  management.   This patient complains of difficulty speaking difficulty ambulating weakness; this involves an extensive number of treatment Options and is a complaint that carries with it a high risk of complications and morbidity. The differential includes stroke, bleed, seizure, vertigo, anxiety, hypoglycemia  I ordered, reviewed and interpreted labs, which included CBC unremarkable, chemistries with mildly low bicarb, urine tox negative, alcohol negative I ordered imaging studies which included CT head CT angio head and neck, MRI brain and I independently    visualized and interpreted imaging which showed no acute findings Additional history obtained from patient's son Previous records obtained and reviewed in epic including recent pulmonology and cardiology notes I consulted neurohospitalist Dr. Vanessa and discussed lab and imaging findings and discussed disposition.  Cardiac monitoring reviewed, sinus rhythm Social determinants considered, barriers including stress, financial, physically inactive Critical Interventions: Patient was a code stroke activation requiring bedside presents and complex decision making  After the interventions stated above, I reevaluated the patient and found patient's symptoms to be improving and she has been ambulatory in the department without any difficulty Admission and further testing considered, no indications for admission at this time.  Will put in for referral for outpatient neurology and give her prescription for trial of meclizine  per neurology notes.  Return instructions discussed      Final diagnoses:  Dizziness    ED Discharge Orders          Ordered    Ambulatory referral to Neurology       Comments: An appointment is requested in approximately: 4 weeks   08/28/23 2307    meclizine  (ANTIVERT ) 25 MG tablet  3 times daily PRN        08/28/23 2307    ondansetron  (ZOFRAN ) 4 MG tablet  Every 8 hours PRN        08/28/23 2310                Angelus Hoopes C, MD 08/29/23 1015

## 2023-08-28 NOTE — Consult Note (Signed)
 NEUROLOGY CONSULT NOTE   Date of service: August 28, 2023 Patient Name: Tonya Odonnell MRN:  [id number] DOB:  05/24/1965 Chief Complaint: code stroke for patient tearful Requesting Provider: No att. providers found  History of Present Illness  Tonya Odonnell is a 58 y.o. female with hx of anemia, osteopenia, HTN, Pulm HTN, hypothyroidism who was at her baseline most of the day today.  She got off the phone after talking to her brother at around 1440 p.m.  Immediately as she got off the couch, she noticed vertigo with room spinning.  She called her PCP office and then later called her son in the evening.  Son got home at 6:30 PM and got her to the ED.  Patient endorses that her vertigo is triggered by her trying to sit or stand up.  It goes away when she is resting.  She is very anxious and tearful.  A code stroke was activated in the ED as patient was having difficulty moving her arms and legs.  On my arrival, patient has all extremity weakness, she has stuttering of her speech.  She is tearful.  However, with a lot of encouragement and deep breaths, she is able to move all extremities.  She is able to follow commands and answer all questions.  She is able to provide detailed history about events today.  LKW: 1440 Modified rankin score: 0-Completely asymptomatic and back to baseline post- stroke IV Thrombolysis: Not offered, symptoms felt to be most consistent with peripheral vertigo specifically BPPV rather than stroke.   EVT: Not offered, low suspicion for an LVO.    NIHSS components Score: Comment  1a Level of Conscious 0[x]  1[]  2[]  3[]      1b LOC Questions 0[]  1[x]  2[]       1c LOC Commands 0[x]  1[]  2[]       2 Best Gaze 0[x]  1[]  2[]       3 Visual 0[x]  1[]  2[]  3[]      4 Facial Palsy 0[x]  1[]  2[]  3[]      5a Motor Arm - left 0[x]  1[]  2[]  3[]  4[]  UN[]    5b Motor Arm - Right 0[x]  1[]  2[]  3[]  4[]  UN[]    6a Motor Leg - Left 0[]  1[x]  2[]  3[]  4[]  UN[]    6b Motor Leg - Right 0[]  1[x]  2[]  3[]  4[]  UN[]     7 Limb Ataxia 0[x]  1[]  2[]  UN[]      8 Sensory 0[x]  1[]  2[]  UN[]      9 Best Language 0[x]  1[]  2[]  3[]      10 Dysarthria 0[]  1[x]  2[]  UN[]    Suspect more of an accident rather than dysarthric speech.  11 Extinct. and Inattention 0[x]  1[]  2[]       TOTAL: 4      ROS  Comprehensive ROS performed and pertinent positives documented in HPI   Past History   Past Medical History:  Diagnosis Date   Anemia    USE TO HAVE IT,NOT NOW,2015,2016   Arthritis    HIPS,KNEES,HANDS   Breast cancer in female (HCC)    Cancer (HCC)    LEFT   Hyperlipidemia    Hypertension    Low iron    Neuromuscular disorder (HCC)    MUSCLE PAIN,FIBROMYALGIA   Osteopenia    Pulmonary HTN (HCC)    PVC (premature ventricular contraction)    Thyroid  disease     Past Surgical History:  Procedure Laterality Date   LAPAROSCOPIC APPENDECTOMY N/A 03/27/2023   Procedure: APPENDECTOMY LAPAROSCOPIC;  Surgeon: Junie Olds, MD;  Location:  [facility] OR;  Service: General;  Laterality: N/A;   Left Ovarian removal Left    MASTECTOMY Left    LYMPHNODES REMOVED   RIGHT HEART CATH N/A 07/02/2023   Procedure: RIGHT HEART CATH;  Surgeon: Darlis Eisenmenger, MD;  Location: [facility] INVASIVE CV LAB;  Service: Cardiovascular;  Laterality: N/A;   RIGHT/LEFT HEART CATH AND CORONARY ANGIOGRAPHY N/A 12/12/2022   Procedure: RIGHT/LEFT HEART CATH AND CORONARY ANGIOGRAPHY;  Surgeon: Wenona Hamilton, MD;  Location: [facility] INVASIVE CV LAB;  Service: Cardiovascular;  Laterality: N/A;    Family History: Family History  Problem Relation Age of Onset   Hypertension Mother    Leukemia Mother    Stomach cancer Father    Colon cancer Neg Hx    Colon polyps Neg Hx    Crohn's disease Neg Hx    Esophageal cancer Neg Hx    Rectal cancer Neg Hx     Social History  reports that she has never smoked. She has never used smokeless tobacco. She reports that she does not drink alcohol and does not use drugs.  Allergies  Allergen Reactions    Lisinopril Other (See Comments)    cough     Medications  No current facility-administered medications for this encounter.  Current Outpatient Medications:    ambrisentan  (LETAIRIS ) 10 MG tablet, Take 1 tablet (10 mg total) by mouth daily., Disp: 30 tablet, Rfl: 11   Azelaic Acid  15 % gel, Apply to face every morning, Disp: 50 g, Rfl: 5   bismuth subsalicylate (PEPTO BISMOL) 262 MG/15ML suspension, Take 30 mLs by mouth every 6 (six) hours as needed for diarrhea or loose stools., Disp: , Rfl:    clobetasol  cream (TEMOVATE ) 0.05 %, Apply 1 Application topically as needed., Disp: 30 g, Rfl: 4   cromolyn  (OPTICROM ) 4 % ophthalmic solution, Place 1 drop into both eyes 4 (four) times daily. (Patient taking differently: Place 1 drop into both eyes daily as needed (Dry eye).), Disp: 10 mL, Rfl: 4   ELIDEL  1 % cream, Apply to red scaly areas on the face BID for seb derm, Disp: 30 g, Rfl: 4   fluticasone  (FLONASE ) 50 MCG/ACT nasal spray, Place 2 sprays into both nostrils daily as needed for allergies or rhinitis., Disp: , Rfl:    furosemide  (LASIX ) 40 MG tablet, Take 1 tablet (40 mg total) by mouth daily., Disp: 90 tablet, Rfl: 1   hydrALAZINE  (APRESOLINE ) 25 MG tablet, Take 1 tablet (25 mg total) by mouth 2 (two) times daily., Disp: 180 tablet, Rfl: 1   Ivermectin  (SOOLANTRA ) 1 % CREA, Apply 1 Application topically at bedtime. (Patient not taking: Reported on 06/30/2023), Disp: 45 g, Rfl: 5   levothyroxine  (SYNTHROID ) 112 MCG tablet, Take 1 pill everyday except Sunday take half a pill, Disp: 90 tablet, Rfl: 1   omeprazole  (PRILOSEC) 40 MG capsule, Take 40 mg by mouth daily as needed (Heartburn)., Disp: , Rfl:    potassium chloride  SA (KLOR-CON  M) 20 MEQ tablet, Take 1 tablet (20 mEq total) by mouth daily., Disp: 90 tablet, Rfl: 1   Selexipag  800 MCG TABS, Take 1 tablet (800 mcg total) by mouth 2 (two) times daily. (Patient taking differently: Take 800 mcg by mouth 2 (two) times daily. Uptravi ), Disp: 60  tablet, Rfl: 11   tadalafil , PAH, (ADCIRCA ) 20 MG tablet, Take 2 tablets (40 mg total) by mouth daily. NDC's covered by plan: 16109604540, [id number], [id number], Disp: , Rfl:    valACYclovir  (VALTREX ) 1000 MG tablet, Take 1,000  mg by mouth daily as needed (Cold Sores)., Disp: , Rfl:   Vitals   Vitals:   09-10-23 1842  BP: (!) 174/112  Pulse: 98  Resp: 19  Temp: 98.2 F (36.8 C)  SpO2: 99%    There is no height or weight on file to calculate BMI.   Physical Exam   General: Anxious and crying in bed; in no acute distress. HENT: Normal oropharynx and mucosa. Normal external appearance of ears and nose.  Neck: Supple, no pain or tenderness  CV: No JVD. No peripheral edema.  Pulmonary: Symmetric Chest rise. Normal respiratory effort.  Abdomen: Soft to touch, non-tender.  Ext: No cyanosis, edema, or deformity  Skin: No rash. Normal palpation of skin.   Musculoskeletal: Normal digits and nails by inspection. No clubbing.   Neurologic Examination  Mental status/Cognition: Alert, oriented to self, place, when asking about a month, she tells me it is 12.  Did not respond to asking her year.  Good attention. Speech/language: Suspect more of an accident rather than dysarthric speech, fluent, comprehension intact, object naming intact, repetition intact. Cranial nerves:   CN II Pupils equal and reactive to light, no VF deficits    CN III,IV,VI EOM intact, no gaze preference or deviation, no nystagmus    CN V normal sensation in V1, V2, and V3 segments bilaterally    CN VII no asymmetry, no nasolabial fold flattening    CN VIII normal hearing to speech    CN IX & X normal palatal elevation, no uvular deviation    CN XI 5/5 head turn and 5/5 shoulder shrug bilaterally    CN XII midline tongue protrusion    Motor:  Muscle bulk: Normal, tone normal. Mvmt Root Nerve  Muscle Right Left Comments  SA C5/6 Ax Deltoid 4+ 4+   EF C5/6 Mc Biceps 4+ 4+   EE C6/7/8 Rad Triceps 4+ 4+   WF  C6/7 Med FCR     WE C7/8 PIN ECU     F Ab C8/T1 U ADM/FDI 4+ 4+   HF L1/2/3 Fem Illopsoas 4+ 4+   KE L2/3/4 Fem Quad     DF L4/5 D Peron Tib Ant 5 5   PF S1/2 Tibial Grc/Sol 5 5    Sensation:  Light touch Intact throughout   Pin prick    Temperature    Vibration   Proprioception    Coordination/Complex Motor:  - Finger to Nose intact bilaterally - Heel to shin intact bilaterally - Rapid alternating movement are slowed - Gait: Deferred. Labs/Imaging/Neurodiagnostic studies   CBC:  Recent Labs  Lab 09/10/2023 1855  HGB 15.6*  HCT 46.0   Basic Metabolic Panel:  Lab Results  Component Value Date   NA 139 2023-09-10   K 4.1 Sep 10, 2023   CO2 19 (L) 08/19/2023   GLUCOSE 99 September 10, 2023   BUN 27 (H) Sep 10, 2023   CREATININE 0.80 09-10-23   CALCIUM  9.6 08/19/2023   GFRNONAA >60 08/17/2023   Lipid Panel:  Lab Results  Component Value Date   LDLCALC 185 (H) 08/19/2023   HgbA1c:  Lab Results  Component Value Date   HGBA1C 5.2 08/19/2023   Urine Drug Screen: No results found for: LABOPIA, COCAINSCRNUR, LABBENZ, AMPHETMU, THCU, LABBARB  Alcohol Level No results found for: ETH INR No results found for: INR APTT No results found for: APTT AED levels: No results found for: PHENYTOIN, ZONISAMIDE, LAMOTRIGINE, LEVETIRACETA  CT Head without contrast(Personally reviewed): CTH was negative for a large hypodensity concerning  for a large territory infarct or hyperdensity concerning for an ICH  CT angio Head and Neck with contrast(Personally reviewed): No LVO  MRI Brain: pending   ASSESSMENT   Kanon Colunga is a 58 y.o. female with hx of anemia, osteopenia, HTN, Pulm HTN, hypothyroidism who presents with vertigo triggered by her sitting or standing up.  The vertigo is intermittent.  No prior similar episodes.  She denies any headache at this time.  Given the intermittent nature of her symptoms, that would be a very odd and atypical presentation for  stroke.  I would expect a stroke induced vertigo to be persistent.  Suspect this is likely benign paroxysmal positional vertigo.  Patient is visibly anxious and crying despite my best attempts to calm her down and offering encouragement.   RECOMMENDATIONS  - MRI brain without contrast.  If negative for an acute stroke, no further workup. - Meclizine every 6 hours as needed along with as needed Zofran . -Outpatient vestibular rehab. ______________________________________________________________________  Plan discussed with patient, her son at the bedside.  Plan also discussed with Dr. Randal Bury with the ED team.  Signed, Gethsemane Fischler, MD [facility]

## 2023-08-28 NOTE — Discharge Instructions (Signed)
 You are seen in the emergency department for evaluation of dizziness difficulty speaking and unsteadiness.  You were seen by neurology and had a CAT scan and an MRI of your brain along with lab work and EKG.  There was no evidence of acute stroke.  Please follow-up with your primary care doctor for further evaluation.  Return to the emergency department if any worsening or concerning symptoms

## 2023-08-28 NOTE — Code Documentation (Signed)
 Stroke Response Nurse Documentation Code Documentation  Tonya Odonnell is a 58 y.o. female arriving to [facility]  via [employer] on 6/20 with past medical hx of HTN, pulm HTN, hypothyroidism. On No antithrombotic. Code stroke was activated by ED.   Patient from home where she was LKW at 1440 and now complaining of dizziness and difficulty following commands for triage staff.    Stroke team at the bedside on patient arrival. Labs drawn and patient cleared for CT by EDP. Patient to CT with team. NIHSS 3, see documentation for details and code stroke times. Patient with disoriented and bilateral leg weakness on exam. The following imaging was completed:  CT Head and CTA. Patient is not a candidate for IV Thrombolytic due to stroke not suspected. Patient is not a candidate for IR due to no LVO.   Care Plan: q2 NIHSS and vitals, swallow screen prior to PO medication.   Bedside handoff with ED RN Ova Bloomer.    Marlon Simpson  Stroke Response RN

## 2023-08-28 NOTE — ED Provider Triage Note (Signed)
 Emergency Medicine Provider Triage Evaluation Note  Tonya Odonnell , a 58 y.o. female  was evaluated in triage.  Pt complains of multiple complaints.  Patient is tearful upon my exam, is accompanied by her son who states that his mother has not been in her normal state since around 330 to 4 PM.  She complained to him of dizziness, disorientation, and has been unable to walk since this time.  Review of Systems  Positive: As above Negative: As above  Physical Exam  BP (!) 174/112 (BP Location: Right Arm)   Pulse 98   Temp 98.2 F (36.8 C)   Resp 19   LMP  (LMP Unknown)   SpO2 99%  Gen:   Awake, tearful and in acute distress Resp:  Normal effort  MSK:   Unable to follow commands Other:  Patient is unable to answer my questions directly and unable to follow commands, unable to assess grip strength or upper/lower extremity strength as she is unable to follow commands.  She shakes her head no when I ask if she is having any decree sensation.  No obvious facial droop or asymmetry.    Medical Decision Making  Medically screening exam initiated at 6:49 PM.  Appropriate orders placed.  Tonya Odonnell was informed that the remainder of the evaluation will be completed by another provider, this initial triage assessment does not replace that evaluation, and the importance of remaining in the ED until their evaluation is complete.  I evaluated this patient in triage and instructed nursing staff to activate a code stroke.   Tonya Odonnell, [place] 08/28/23 [phone]

## 2023-08-31 ENCOUNTER — Ambulatory Visit (HOSPITAL_COMMUNITY)
Admission: RE | Admit: 2023-08-31 | Discharge: 2023-08-31 | Disposition: A | Discharge status: Home / Self Care | Source: Ambulatory Visit | Attending: Cardiology | Admitting: Cardiology

## 2023-08-31 ENCOUNTER — Other Ambulatory Visit

## 2023-08-31 DIAGNOSIS — Z853 Personal history of malignant neoplasm of breast: Secondary | ICD-10-CM | POA: Insufficient documentation

## 2023-08-31 DIAGNOSIS — I110 Hypertensive heart disease with heart failure: Secondary | ICD-10-CM | POA: Insufficient documentation

## 2023-08-31 DIAGNOSIS — I509 Heart failure, unspecified: Secondary | ICD-10-CM | POA: Diagnosis not present

## 2023-08-31 DIAGNOSIS — R079 Chest pain, unspecified: Secondary | ICD-10-CM | POA: Diagnosis not present

## 2023-08-31 DIAGNOSIS — E063 Autoimmune thyroiditis: Secondary | ICD-10-CM | POA: Diagnosis not present

## 2023-08-31 DIAGNOSIS — I272 Pulmonary hypertension, unspecified: Secondary | ICD-10-CM | POA: Diagnosis not present

## 2023-08-31 DIAGNOSIS — E785 Hyperlipidemia, unspecified: Secondary | ICD-10-CM | POA: Insufficient documentation

## 2023-08-31 DIAGNOSIS — Z7689 Persons encountering health services in other specified circumstances: Secondary | ICD-10-CM | POA: Diagnosis not present

## 2023-08-31 LAB — T3, FREE: T3, Free: 2.9 pg/mL (ref 2.3–4.2)

## 2023-08-31 LAB — ECHOCARDIOGRAM COMPLETE
Area-P 1/2: 4.8 cm2
S' Lateral: 2.2 cm

## 2023-08-31 LAB — TSH: TSH: 5.48 m[IU]/L — ABNORMAL HIGH (ref 0.40–4.50)

## 2023-08-31 LAB — T4, FREE: Free T4: 1.8 ng/dL (ref 0.8–1.8)

## 2023-08-31 NOTE — Progress Notes (Signed)
  Echocardiogram 2D Echocardiogram has been performed.  Koleen KANDICE Popper, RDCS 08/31/2023, 10:45 AM

## 2023-09-01 ENCOUNTER — Other Ambulatory Visit: Payer: Self-pay

## 2023-09-01 ENCOUNTER — Encounter

## 2023-09-01 ENCOUNTER — Ambulatory Visit (HOSPITAL_COMMUNITY): Payer: Self-pay | Admitting: Cardiology

## 2023-09-01 DIAGNOSIS — Z7689 Persons encountering health services in other specified circumstances: Secondary | ICD-10-CM | POA: Diagnosis not present

## 2023-09-01 NOTE — Progress Notes (Signed)
 Enrolling patient into correct program

## 2023-09-02 NOTE — Telephone Encounter (Signed)
Mychart message has been read by patient.

## 2023-09-03 ENCOUNTER — Encounter: Payer: Self-pay | Admitting: "Endocrinology

## 2023-09-03 ENCOUNTER — Telehealth (HOSPITAL_COMMUNITY): Payer: Self-pay | Admitting: Surgery

## 2023-09-03 ENCOUNTER — Ambulatory Visit: Admitting: "Endocrinology

## 2023-09-03 ENCOUNTER — Other Ambulatory Visit: Payer: Self-pay

## 2023-09-03 VITALS — BP 120/84 | HR 98 | Ht 63.0 in | Wt 180.0 lb

## 2023-09-03 DIAGNOSIS — E063 Autoimmune thyroiditis: Secondary | ICD-10-CM | POA: Diagnosis not present

## 2023-09-03 DIAGNOSIS — Z7689 Persons encountering health services in other specified circumstances: Secondary | ICD-10-CM | POA: Diagnosis not present

## 2023-09-03 NOTE — Progress Notes (Signed)
 Outpatient Endocrinology Note Tonya Birmingham, MD  09/03/23   Tonya Odonnell 11-05-1965 [id number]  Referring Provider: Cannady, Tonya T, NP Primary Care Provider: Cannady, Tonya T, NP Subjective  No chief complaint on file.   Assessment & Plan  Diagnoses and all orders for this visit:  Hashimoto's thyroiditis -     TSH -     T4, free    Tonya Odonnell is currently taking levothyroxine  112 mcg every day except Sundays take half a pill. Patient's FT4 has normalized for the first time. Mildly elevated TSH appropriate for age. Educated on thyroid  axis.  Recommend the following: continue current dose: Take levothyroxine  112 mcg every day except Sundays take half a pill. Advised to take levothyroxine  first thing in the morning on empty stomach and wait at least 30 minutes to 1 hour before eating or drinking anything or taking any other medications. Space out levothyroxine  by 4 hours from any acid reflux medication/fibrate/iron/calcium /multivitamin. Advised to take nutritional supplements in the evening. Repeat lab before next visit or sooner if symptoms of hyperthyroidism or hypothyroidism develop.  Notify us  immediately in case of significant weight gain or loss. Counseled on compliance and follow up needs.  I have reviewed current medications, nurse's notes, allergies, vital signs, past medical and surgical history, family medical history, and social history for this encounter. Counseled patient on symptoms, examination findings, lab findings, imaging results, treatment decisions and monitoring and prognosis. The patient understood the recommendations and agrees with the treatment plan. All questions regarding treatment plan were fully answered.   Return in about 3 months (around 12/04/2023).   Tonya Birmingham, MD  09/03/23   I have reviewed current medications, nurse's notes, allergies, vital signs, past medical and surgical history, family medical history, and social history  for this encounter. Counseled patient on symptoms, examination findings, lab findings, imaging results, treatment decisions and monitoring and prognosis. The patient understood the recommendations and agrees with the treatment plan. All questions regarding treatment plan were fully answered.   History of Present Illness Tonya Odonnell is a 58 y.o. year old female who presents to our clinic with hypothyroidism diagnosed in 30s.  On levothyroxine  112 mcg every day except Sundays take half a pill.  Recently has ED visit for dizziness weakness unable to speak to triage s/p MRI and CT. Plans to see neurology.   Symptoms suggestive of HYPOTHYROIDISM:  fatigue Yes weight gain No cold intolerance  No constipation  No  Symptoms suggestive of HYPERTHYROIDISM:  weight loss  No heat intolerance Yes hyperdefecation  No palpitations  No, constipation   Compressive symptoms:  dysphagia  No dysphonia  Yes positional dyspnea (especially with simultaneous arms elevation)  No  Smokes  No On biotin  No Personal history of head/neck surgery/irradiation  No  Physical Exam  BP 120/84   Pulse 98   Ht 5' 3 (1.6 m)   Wt 180 lb (81.6 kg)   LMP  (LMP Unknown)   SpO2 98%   BMI 31.89 kg/m  Constitutional: well developed, well nourished Head: normocephalic, atraumatic, no exophthalmos Eyes: sclera anicteric, no redness Neck: no thyromegaly, no thyroid  tenderness; no nodules palpated Lungs: normal respiratory effort Neurology: alert and oriented, no fine hand tremor Skin: dry, no appreciable rashes Musculoskeletal: no appreciable defects Psychiatric: normal mood and affect  Allergies Allergies  Allergen Reactions   Lisinopril Other (See Comments)    cough     Current Medications Patient's Medications  New Prescriptions   No medications on file  Previous Medications   AMBRISENTAN  (LETAIRIS ) 10 MG TABLET    Take 1 tablet (10 mg total) by mouth daily.   AZELAIC ACID  15 % GEL    Apply to  face every morning   BISMUTH SUBSALICYLATE (PEPTO BISMOL) 262 MG/15ML SUSPENSION    Take 30 mLs by mouth every 6 (six) hours as needed for diarrhea or loose stools.   CLOBETASOL  CREAM (TEMOVATE ) 0.05 %    Apply 1 Application topically as needed.   CROMOLYN  (OPTICROM ) 4 % OPHTHALMIC SOLUTION    Place 1 drop into both eyes 4 (four) times daily.   ELIDEL  1 % CREAM    Apply to red scaly areas on the face BID for seb derm   FLUTICASONE  (FLONASE ) 50 MCG/ACT NASAL SPRAY    Place 2 sprays into both nostrils daily as needed for allergies or rhinitis.   FUROSEMIDE  (LASIX ) 40 MG TABLET    Take 1 tablet (40 mg total) by mouth daily.   HYDRALAZINE  (APRESOLINE ) 25 MG TABLET    Take 1 tablet (25 mg total) by mouth 2 (two) times daily.   IVERMECTIN  (SOOLANTRA ) 1 % CREA    Apply 1 Application topically at bedtime.   LEVOTHYROXINE  (SYNTHROID ) 112 MCG TABLET    Take 1 pill everyday except Sunday take half a pill   MECLIZINE  (ANTIVERT ) 25 MG TABLET    Take 1 tablet (25 mg total) by mouth 3 (three) times daily as needed for dizziness.   OMEPRAZOLE  (PRILOSEC) 40 MG CAPSULE    Take 40 mg by mouth daily as needed (Heartburn).   ONDANSETRON  (ZOFRAN ) 4 MG TABLET    Take 1 tablet (4 mg total) by mouth every 8 (eight) hours as needed for nausea or vomiting.   POTASSIUM CHLORIDE  SA (KLOR-CON  M) 20 MEQ TABLET    Take 1 tablet (20 mEq total) by mouth daily.   SELEXIPAG  800 MCG TABS    Take 1 tablet (800 mcg total) by mouth 2 (two) times daily.   TADALAFIL , PAH, (ADCIRCA ) 20 MG TABLET    Take 2 tablets (40 mg total) by mouth daily. NDC's covered by plan: 66657972190, [id number], [id number]   VALACYCLOVIR  (VALTREX ) 1000 MG TABLET    Take 1,000 mg by mouth daily as needed (Cold Sores).  Modified Medications   No medications on file  Discontinued Medications   No medications on file    Past Medical History Past Medical History:  Diagnosis Date   Anemia    USE TO HAVE IT,NOT NOW,2015,2016   Arthritis    HIPS,KNEES,HANDS    Breast cancer in female (HCC)    Cancer (HCC)    LEFT   Hyperlipidemia    Hypertension    Low iron    Neuromuscular disorder (HCC)    MUSCLE PAIN,FIBROMYALGIA   Osteopenia    Pulmonary HTN (HCC)    PVC (premature ventricular contraction)    Thyroid  disease     Past Surgical History Past Surgical History:  Procedure Laterality Date   LAPAROSCOPIC APPENDECTOMY N/A 03/27/2023   Procedure: APPENDECTOMY LAPAROSCOPIC;  Surgeon: Stechschulte, Deward PARAS, MD;  Location: [facility] OR;  Service: General;  Laterality: N/A;   Left Ovarian removal Left    MASTECTOMY Left    LYMPHNODES REMOVED   RIGHT HEART CATH N/A 07/02/2023   Procedure: RIGHT HEART CATH;  Surgeon: Rolan Ezra RAMAN, MD;  Location: [facility] INVASIVE CV LAB;  Service: Cardiovascular;  Laterality: N/A;   RIGHT/LEFT HEART CATH AND CORONARY ANGIOGRAPHY N/A 12/12/2022   Procedure: RIGHT/LEFT HEART CATH  AND CORONARY ANGIOGRAPHY;  Surgeon: Darron Deatrice LABOR, MD;  Location: [facility] INVASIVE CV LAB;  Service: Cardiovascular;  Laterality: N/A;    Family History family history includes Hypertension in her mother; Leukemia in her mother; Stomach cancer in her father.  Social History Social History   Socioeconomic History   Marital status: Married    Spouse name: Not on file   Number of children: Not on file   Years of education: Not on file   Highest education level: Not on file  Occupational History   Occupation: disabled  Tobacco Use   Smoking status: Never   Smokeless tobacco: Never  Vaping Use   Vaping status: Never Used  Substance and Sexual Activity   Alcohol use: Never   Drug use: Never   Sexual activity: Not Currently  Other Topics Concern   Not on file  Social History Narrative   Not on file   Social Drivers of Health   Financial Resource Strain: High Risk (05/13/2021)   [facility] from [facility]   Overall Financial Resource Strain (CARDIA)    Difficulty of Paying Living Expenses: Very hard  Food Insecurity: No Food  Insecurity (03/27/2023)   Hunger Vital Sign    Worried About Running Out of Food in the Last Year: Never true    Ran Out of Food in the Last Year: Never true  Transportation Needs: No Transportation Needs (03/27/2023)   PRAPARE - Administrator, Civil Service (Medical): No    Lack of Transportation (Non-Medical): No  Physical Activity: Inactive (05/13/2021)   Received from [facility]   Exercise Vital Sign    On average, how many days per week do you engage in moderate to strenuous exercise (like a brisk walk)?: 0 days    On average, how many minutes do you engage in exercise at this level?: 0 min  Stress: Stress Concern Present (05/13/2021)   Received from [facility] - Occupational Stress Questionnaire    Feeling of Stress : Very much  Social Connections: Unknown (07/13/2021)   Received from [facility]   Social Network    Social Network: Not on file  Intimate Partner Violence: Not At Risk (03/27/2023)   Humiliation, Afraid, Rape, and Kick questionnaire    Fear of Current or Ex-Partner: No    Emotionally Abused: No    Physically Abused: No    Sexually Abused: No    Laboratory Investigations Lab Results  Component Value Date   TSH 5.48 (H) 08/31/2023   TSH 2.99 06/01/2023   TSH 2.667 12/31/2022   FREET4 1.8 08/31/2023   FREET4 1.9 (H) 06/01/2023   FREET4 1.72 (H) 12/31/2022     No results found for: TSI   No components found for: TRAB   Lab Results  Component Value Date   CHOL 276 (H) 08/19/2023   Lab Results  Component Value Date   HDL 73 08/19/2023   Lab Results  Component Value Date   LDLCALC 185 (H) 08/19/2023   Lab Results  Component Value Date   TRIG 107 08/19/2023   Lab Results  Component Value Date   CHOLHDL 3.5 12/11/2022   Lab Results  Component Value Date   CREATININE 0.80 08/28/2023   No results found for: GFR    Component Value Date/Time   NA 139 08/28/2023 1855   NA 142  08/19/2023 1200   K 4.1 08/28/2023 1855   CL 106 08/28/2023 1855   CO2 19 (  L) 08/28/2023 1849   GLUCOSE 99 08/28/2023 1855   BUN 27 (H) 08/28/2023 1855   BUN 18 08/19/2023 1200   CREATININE 0.80 08/28/2023 1855   CREATININE 0.82 08/17/2023 1027   CALCIUM  9.4 08/28/2023 1849   PROT 7.5 08/28/2023 1849   PROT 6.9 08/19/2023 1200   ALBUMIN  4.2 08/28/2023 1849   ALBUMIN  4.5 08/19/2023 1200   AST 34 08/28/2023 1849   AST 28 08/17/2023 1027   ALT 18 08/28/2023 1849   ALT 19 08/17/2023 1027   ALKPHOS 73 08/28/2023 1849   BILITOT 0.8 08/28/2023 1849   BILITOT 0.8 08/19/2023 1200   BILITOT 1.2 08/17/2023 1027   GFRNONAA >60 08/28/2023 1849   GFRNONAA >60 08/17/2023 1027      Latest Ref Rng & Units 08/28/2023    6:55 PM 08/28/2023    6:49 PM 08/19/2023   12:00 PM  BMP  Glucose 70 - 99 mg/dL 99  97  85   BUN 6 - 20 mg/dL 27  21  18    Creatinine 0.44 - 1.00 mg/dL 9.19  9.18  9.23   BUN/Creat Ratio 9 - 23   24   Sodium 135 - 145 mmol/L 139  137  142   Potassium 3.5 - 5.1 mmol/L 4.1  4.5  4.2   Chloride 98 - 111 mmol/L 106  106  107   CO2 22 - 32 mmol/L  19  19   Calcium  8.9 - 10.3 mg/dL  9.4  9.6        Component Value Date/Time   WBC 9.0 08/28/2023 1849   RBC 5.44 (H) 08/28/2023 1849   HGB 15.6 (H) 08/28/2023 1855   HGB 14.2 08/17/2023 1027   HGB 17.7 (H) 12/22/2022 1212   HCT 46.0 08/28/2023 1855   HCT 53.6 (H) 12/22/2022 1212   PLT 314 08/28/2023 1849   PLT 310 08/17/2023 1027   PLT 379 12/22/2022 1212   MCV 85.8 08/28/2023 1849   MCV 89 12/22/2022 1212   MCH 28.7 08/28/2023 1849   MCHC 33.4 08/28/2023 1849   RDW 13.5 08/28/2023 1849   RDW 12.9 12/22/2022 1212   LYMPHSABS 3.2 08/28/2023 1849   LYMPHSABS 2.2 12/22/2022 1212   MONOABS 0.6 08/28/2023 1849   EOSABS 0.1 08/28/2023 1849   EOSABS 0.1 12/22/2022 1212   BASOSABS 0.1 08/28/2023 1849   BASOSABS 0.1 12/22/2022 1212      Parts of this note may have been dictated using voice recognition software. There may  be variances in spelling and vocabulary which are unintentional. Not all errors are proofread. Please notify the dino if any discrepancies are noted or if the meaning of any statement is not clear.

## 2023-09-03 NOTE — Progress Notes (Signed)
 Specialty Pharmacy Refill Coordination Note  Tonya Odonnell is a 58 y.o. female contacted today regarding refills of specialty medication(s) Ambrisentan  (LETAIRIS )   Patient requested Delivery   Delivery date: 09/08/23   Verified address: [address]  [place] [id number]   Medication will be filled on 09/07/23.

## 2023-09-03 NOTE — Telephone Encounter (Signed)
 I received a call from patient to complain about her recent experience with scheduling an appt with our clinic as well as Echo.  She tells me that her appts were cancelled secondary to no authorization.  Upon chart review it seems that the echocardiogram was completed 6/23 as scheduled and her clinic appt was cancelled for unknown reason.  I have rescheduled her clinic appt with Dr. Rolan in July. She seemed content with this outcome.

## 2023-09-08 ENCOUNTER — Encounter: Attending: Cardiology

## 2023-09-08 DIAGNOSIS — I272 Pulmonary hypertension, unspecified: Secondary | ICD-10-CM | POA: Insufficient documentation

## 2023-09-09 ENCOUNTER — Encounter: Payer: Self-pay | Admitting: *Deleted

## 2023-09-09 DIAGNOSIS — I272 Pulmonary hypertension, unspecified: Secondary | ICD-10-CM

## 2023-09-09 DIAGNOSIS — Z7689 Persons encountering health services in other specified circumstances: Secondary | ICD-10-CM | POA: Diagnosis not present

## 2023-09-09 NOTE — Progress Notes (Signed)
 Pulmonary Individual Treatment Plan  Patient Details  Name: Tonya Odonnell MRN: [id number] Date of Birth: [birth year] Referring Provider:   Flowsheet Row Pulmonary Rehab from 07/22/2023 in [facility] Cardiac and Pulmonary Rehab  Referring Provider Rolan Barrack, MD    Initial Encounter Date:  Flowsheet Row Pulmonary Rehab from 07/22/2023 in [facility] Cardiac and Pulmonary Rehab  Date 07/22/23    Visit Diagnosis: Pulmonary hypertension (HCC)  Patient's Home Medications on Admission:  Current Outpatient Medications:    ambrisentan  (LETAIRIS ) 10 MG tablet, Take 1 tablet (10 mg total) by mouth daily., Disp: 30 tablet, Rfl: 11   Azelaic Acid  15 % gel, Apply to face every morning, Disp: 50 g, Rfl: 5   bismuth subsalicylate (PEPTO BISMOL) 262 MG/15ML suspension, Take 30 mLs by mouth every 6 (six) hours as needed for diarrhea or loose stools., Disp: , Rfl:    clobetasol  cream (TEMOVATE ) 0.05 %, Apply 1 Application topically as needed., Disp: 30 g, Rfl: 4   cromolyn  (OPTICROM ) 4 % ophthalmic solution, Place 1 drop into both eyes 4 (four) times daily. (Patient taking differently: Place 1 drop into both eyes daily as needed (Dry eye).), Disp: 10 mL, Rfl: 4   ELIDEL  1 % cream, Apply to red scaly areas on the face BID for seb derm, Disp: 30 g, Rfl: 4   fluticasone  (FLONASE ) 50 MCG/ACT nasal spray, Place 2 sprays into both nostrils daily as needed for allergies or rhinitis., Disp: , Rfl:    furosemide  (LASIX ) 40 MG tablet, Take 1 tablet (40 mg total) by mouth daily., Disp: 90 tablet, Rfl: 1   hydrALAZINE  (APRESOLINE ) 25 MG tablet, Take 1 tablet (25 mg total) by mouth 2 (two) times daily., Disp: 180 tablet, Rfl: 1   Ivermectin  (SOOLANTRA ) 1 % CREA, Apply 1 Application topically at bedtime., Disp: 45 g, Rfl: 5   levothyroxine  (SYNTHROID ) 112 MCG tablet, Take 1 pill everyday except Sunday take half a pill, Disp: 90 tablet, Rfl: 1   meclizine  (ANTIVERT ) 25 MG tablet, Take 1 tablet (25 mg total) by mouth 3 (three) times  daily as needed for dizziness., Disp: 30 tablet, Rfl: 0   omeprazole  (PRILOSEC) 40 MG capsule, Take 40 mg by mouth daily as needed (Heartburn)., Disp: , Rfl:    ondansetron  (ZOFRAN ) 4 MG tablet, Take 1 tablet (4 mg total) by mouth every 8 (eight) hours as needed for nausea or vomiting., Disp: 15 tablet, Rfl: 0   potassium chloride  SA (KLOR-CON  M) 20 MEQ tablet, Take 1 tablet (20 mEq total) by mouth daily., Disp: 90 tablet, Rfl: 1   Selexipag  800 MCG TABS, Take 1 tablet (800 mcg total) by mouth 2 (two) times daily. (Patient taking differently: Take 800 mcg by mouth 2 (two) times daily. Uptravi ), Disp: 60 tablet, Rfl: 11   tadalafil , PAH, (ADCIRCA ) 20 MG tablet, Take 2 tablets (40 mg total) by mouth daily. NDC's covered by plan: [id number], [id number], [id number], Disp: , Rfl:    valACYclovir  (VALTREX ) 1000 MG tablet, Take 1,000 mg by mouth daily as needed (Cold Sores)., Disp: , Rfl:   Past Medical History: Past Medical History:  Diagnosis Date   Anemia    USE TO HAVE IT,NOT NOW,2015,2016   Arthritis    HIPS,KNEES,HANDS   Breast cancer in female (HCC)    Cancer (HCC)    LEFT   Hyperlipidemia    Hypertension    Low iron    Neuromuscular disorder (HCC)    MUSCLE PAIN,FIBROMYALGIA   Osteopenia    Pulmonary HTN (HCC)  PVC (premature ventricular contraction)    Thyroid  disease     Tobacco Use: Social History   Tobacco Use  Smoking Status Never  Smokeless Tobacco Never    Labs: Review Flowsheet  More data exists      Latest Ref Rng & Units 12/12/2022 12/22/2022 07/02/2023 08/19/2023 08/28/2023  Labs for ITP Cardiac and Pulmonary Rehab  Cholestrol 100 - 199 mg/dL - - - 723  -  LDL (calc) 0 - 99 mg/dL - - - 814  -  HDL-C >60 mg/dL - - - 73  -  Trlycerides 0 - 149 mg/dL - - - 892  -  Hemoglobin A1c 4.8 - 5.6 % - 5.7  - 5.2  -  PH, Arterial 7.35 - 7.45 7.399  - - - -  PCO2 arterial 32 - 48 mmHg 35.2  - - - -  Bicarbonate 20.0 - 28.0 mmol/L 21.8  24.0  - 23.9  23.5  - -  TCO2  22 - 32 mmol/L 23  25  - 25  25  - 22   Acid-base deficit 0.0 - 2.0 mmol/L 2.0  1.0  - 1.0  2.0  - -  O2 Saturation % 94  66  - 71  71  - -    Details       Multiple values from one day are sorted in reverse-chronological order          Pulmonary Assessment Scores:  Pulmonary Assessment Scores     Row Name 07/22/23 1551         mMRC Score   mMRC Score 4        UCSD: Self-administered rating of dyspnea associated with activities of daily living (ADLs) 6-point scale (0 = not at all to 5 = maximal or unable to do because of breathlessness)  Scoring Scores range from 0 to 120.  Minimally important difference is 5 units  CAT: CAT can identify the health impairment of COPD patients and is better correlated with disease progression.  CAT has a scoring range of zero to 40. The CAT score is classified into four groups of low (less than 10), medium (10 - 20), high (21-30) and very high (31-40) based on the impact level of disease on health status. A CAT score over 10 suggests significant symptoms.  A worsening CAT score could be explained by an exacerbation, poor medication adherence, poor inhaler technique, or progression of COPD or comorbid conditions.  CAT MCID is 2 points  mMRC: mMRC (Modified Medical Research Council) Dyspnea Scale is used to assess the degree of baseline functional disability in patients of respiratory disease due to dyspnea. No minimal important difference is established. A decrease in score of 1 point or greater is considered a positive change.   Pulmonary Function Assessment:  Pulmonary Function Assessment - 07/20/23 1335       Breath   Shortness of Breath Yes;Limiting activity          Exercise Target Goals: Exercise Program Goal: Individual exercise prescription set using results from initial 6 min walk test and THRR while considering  patient's activity barriers and safety.   Exercise Prescription Goal: Initial exercise prescription builds  to 30-45 minutes a day of aerobic activity, 2-3 days per week.  Home exercise guidelines will be given to patient during program as part of exercise prescription that the participant will acknowledge.  Education: Aerobic Exercise: - Group verbal and visual presentation on the components of exercise prescription. Introduces F.I.T.T principle from  ACSM for exercise prescriptions.  Reviews F.I.T.T. principles of aerobic exercise including progression. Written material given at graduation.   Education: Resistance Exercise: - Group verbal and visual presentation on the components of exercise prescription. Introduces F.I.T.T principle from ACSM for exercise prescriptions  Reviews F.I.T.T. principles of resistance exercise including progression. Written material given at graduation.    Education: Exercise & Equipment Safety: - Individual verbal instruction and demonstration of equipment use and safety with use of the equipment. Flowsheet Row Pulmonary Rehab from 07/22/2023 in [facility] Cardiac and Pulmonary Rehab  Date 07/22/23  Educator MB  Instruction Review Code 1- Verbalizes Understanding    Education: Exercise Physiology & General Exercise Guidelines: - Group verbal and written instruction with models to review the exercise physiology of the cardiovascular system and associated critical values. Provides general exercise guidelines with specific guidelines to those with heart or lung disease.    Education: Flexibility, Balance, Mind/Body Relaxation: - Group verbal and visual presentation with interactive activity on the components of exercise prescription. Introduces F.I.T.T principle from ACSM for exercise prescriptions. Reviews F.I.T.T. principles of flexibility and balance exercise training including progression. Also discusses the mind body connection.  Reviews various relaxation techniques to help reduce and manage stress (i.e. Deep breathing, progressive muscle relaxation, and visualization).  Balance handout provided to take home. Written material given at graduation.   Activity Barriers & Risk Stratification:  Activity Barriers & Cardiac Risk Stratification - 07/22/23 1544       Activity Barriers & Cardiac Risk Stratification   Activity Barriers Fibromyalgia;Back Problems;Shortness of Breath;Other (comment)    Comments Pain in whole body (the more she moves as the day goes on, the more pain there is), lymphodema          6 Minute Walk:  6 Minute Walk     Row Name 07/22/23 1541         6 Minute Walk   Phase Initial     Distance 1225 feet     Walk Time 6 minutes     # of Rest Breaks 0     MPH 2.32     METS 3.28     RPE 11     Perceived Dyspnea  0     VO2 Peak 11.48     Symptoms Yes (comment)     Comments Nerve pain (same pain as resting)     Resting HR 92 bpm     Resting BP 120/84     Resting Oxygen Saturation  90 %     Exercise Oxygen Saturation  during 6 min walk 90 %     Max Ex. HR 109 bpm     Max Ex. BP 132/88     2 Minute Post BP 126/88       Interval HR   1 Minute HR 100     2 Minute HR 107     3 Minute HR 109     4 Minute HR 108     5 Minute HR 108     6 Minute HR 108     2 Minute Post HR 89     Interval Heart Rate? Yes       Interval Oxygen   Interval Oxygen? Yes     Baseline Oxygen Saturation % 90 %     1 Minute Oxygen Saturation % 90 %     1 Minute Liters of Oxygen 0 L     2 Minute Oxygen Saturation % 90 %  2 Minute Liters of Oxygen 0 L     3 Minute Oxygen Saturation % 91 %     3 Minute Liters of Oxygen 0 L     4 Minute Oxygen Saturation % 91 %     4 Minute Liters of Oxygen 0 L     5 Minute Oxygen Saturation % 92 %     5 Minute Liters of Oxygen 0 L     6 Minute Oxygen Saturation % 94 %     6 Minute Liters of Oxygen 0 L     2 Minute Post Oxygen Saturation % 94 %     2 Minute Post Liters of Oxygen 0 L       Oxygen Initial Assessment:  Oxygen Initial Assessment - 07/20/23 1334       Home Oxygen   Home Oxygen Device None     Sleep Oxygen Prescription None    Home Exercise Oxygen Prescription None    Home Resting Oxygen Prescription None      Initial 6 min Walk   Oxygen Used None      Program Oxygen Prescription   Program Oxygen Prescription None      Intervention   Short Term Goals To learn and understand importance of monitoring SPO2 with pulse oximeter and demonstrate accurate use of the pulse oximeter.;To learn and demonstrate proper pursed lip breathing techniques or other breathing techniques. ;To learn and exhibit compliance with exercise, home and travel O2 prescription;To learn and understand importance of maintaining oxygen saturations>88%;To learn and demonstrate proper use of respiratory medications    Long  Term Goals Exhibits compliance with exercise, home  and travel O2 prescription;Maintenance of O2 saturations>88%;Compliance with respiratory medication;Demonstrates proper use of MDI's;Verbalizes importance of monitoring SPO2 with pulse oximeter and return demonstration;Exhibits proper breathing techniques, such as pursed lip breathing or other method taught during program session          Oxygen Re-Evaluation:  Oxygen Re-Evaluation     Row Name 07/28/23 1117             Program Oxygen Prescription   Program Oxygen Prescription None         Home Oxygen   Home Oxygen Device None       Sleep Oxygen Prescription None       Home Exercise Oxygen Prescription None       Home Resting Oxygen Prescription None       Compliance with Home Oxygen Use Yes         Goals/Expected Outcomes   Short Term Goals To learn and demonstrate proper pursed lip breathing techniques or other breathing techniques.        Long  Term Goals Exhibits proper breathing techniques, such as pursed lip breathing or other method taught during program session       Comments Reviewed PLB technique with pt.  Talked about how it works and it's importance in maintaining their exercise saturations.       Goals/Expected  Outcomes Short: Become more profiecient at using PLB. Long: Become independent at using PLB.          Oxygen Discharge (Final Oxygen Re-Evaluation):  Oxygen Re-Evaluation - 07/28/23 1117       Program Oxygen Prescription   Program Oxygen Prescription None      Home Oxygen   Home Oxygen Device None    Sleep Oxygen Prescription None    Home Exercise Oxygen Prescription None    Home Resting Oxygen Prescription None  Compliance with Home Oxygen Use Yes      Goals/Expected Outcomes   Short Term Goals To learn and demonstrate proper pursed lip breathing techniques or other breathing techniques.     Long  Term Goals Exhibits proper breathing techniques, such as pursed lip breathing or other method taught during program session    Comments Reviewed PLB technique with pt.  Talked about how it works and it's importance in maintaining their exercise saturations.    Goals/Expected Outcomes Short: Become more profiecient at using PLB. Long: Become independent at using PLB.          Initial Exercise Prescription:  Initial Exercise Prescription - 07/22/23 1500       Date of Initial Exercise RX and Referring Provider   Date 07/22/23    Referring Provider Rolan Barrack, MD      Oxygen   Maintain Oxygen Saturation 88% or higher      Recumbant Bike   Level 2    RPM 50    Watts 33    Minutes 15    METs 3.28      NuStep   Level 2   T4 &T6   SPM 80    Minutes 15    METs 3.28      T5 Nustep   Level 2    SPM 80    Minutes 15    METs 3.28      Biostep-RELP   Level 2    SPM 50    Minutes 15    METs 3.28      Track   Laps 32    Minutes 15    METs 2.74      Prescription Details   Frequency (times per week) 2    Duration Progress to 30 minutes of continuous aerobic without signs/symptoms of physical distress      Intensity   THRR 40-80% of Max Heartrate 118-148    Ratings of Perceived Exertion 11-13    Perceived Dyspnea 0-4      Progression   Progression Continue  to progress workloads to maintain intensity without signs/symptoms of physical distress.      Resistance Training   Training Prescription Yes    Weight 2 lb    Reps 10-15          Perform Capillary Blood Glucose checks as needed.  Exercise Prescription Changes:   Exercise Prescription Changes     Row Name 07/22/23 1500 08/06/23 1700 08/20/23 1700 09/02/23 1500       Response to Exercise   Blood Pressure (Admit) 120/84 114/74 116/70 114/62    Blood Pressure (Exercise) 132/88 134/78 122/76 --    Blood Pressure (Exit) 126/88 108/64 108/70 116/66    Heart Rate (Admit) 92 bpm 97 bpm 102 bpm 97 bpm    Heart Rate (Exercise) 109 bpm 108 bpm 127 bpm 135 bpm    Heart Rate (Exit) 89 bpm 94 bpm 124 bpm 100 bpm    Oxygen Saturation (Admit) 90 % 95 % 95 % 91 %    Oxygen Saturation (Exercise) 90 % 90 % 90 % 90 %    Oxygen Saturation (Exit) 94 % 93 % 89 % 91 %    Rating of Perceived Exertion (Exercise) 11 15 15 15     Perceived Dyspnea (Exercise) 0 -- 1 --    Symptoms Nerve pain (same pain as resting) none nonw none    Comments results First 2 exercise sessions -- --    Duration  Progress to 30 minutes of  aerobic without signs/symptoms of physical distress Progress to 30 minutes of  aerobic without signs/symptoms of physical distress Continue with 30 min of aerobic exercise without signs/symptoms of physical distress. Continue with 30 min of aerobic exercise without signs/symptoms of physical distress.    Intensity THRR New THRR unchanged THRR unchanged THRR unchanged      Progression   Progression Continue to progress workloads to maintain intensity without signs/symptoms of physical distress. Continue to progress workloads to maintain intensity without signs/symptoms of physical distress. Continue to progress workloads to maintain intensity without signs/symptoms of physical distress. Continue to progress workloads to maintain intensity without signs/symptoms of physical distress.     Average METs 3.28 2.47 2.77 1.82      Resistance Training   Training Prescription -- Yes Yes Yes    Weight -- 2 lb 2 lb 2 lb    Reps -- 10-15 10-15 10-15      Interval Training   Interval Training -- No No No      Treadmill   MPH -- -- -- 1    Grade -- -- -- 0    Minutes -- -- -- 15    METs -- -- -- 1.77      Recumbant Bike   Level -- 2 2 1     Watts -- 16 25 18     Minutes -- 15 15 15     METs -- 2.61 2.95 2.7      NuStep   Level -- 2 2 6     Minutes -- 15 15 15     METs -- 2.3 2 0.9      T5 Nustep   Level -- 1 1 2     Minutes -- 15 15 15     METs -- 2 1.9 1.9      Biostep-RELP   Level -- -- 2 --    Minutes -- -- 15 --    METs -- -- 5 --      Track   Laps -- -- 15 --    Minutes -- -- 15 --    METs -- -- 1.82 --      Oxygen   Maintain Oxygen Saturation -- 88% or higher 88% or higher 88% or higher       Exercise Comments:   Exercise Comments     Row Name 07/28/23 1117           Exercise Comments First full day of exercise!  Patient was oriented to gym and equipment including functions, settings, policies, and procedures.  Patient's individual exercise prescription and treatment plan were reviewed.  All starting workloads were established based on the results of the 6 minute walk test done at initial orientation visit.  The plan for exercise progression was also introduced and progression will be customized based on patient's performance and goals.          Exercise Goals and Review:   Exercise Goals     Row Name 07/22/23 1550             Exercise Goals   Increase Physical Activity Yes       Intervention Provide advice, education, support and counseling about physical activity/exercise needs.;Develop an individualized exercise prescription for aerobic and resistive training based on initial evaluation findings, risk stratification, comorbidities and participant's personal goals.       Expected Outcomes Short Term: Attend rehab on a regular basis to  increase amount of physical activity.;Long Term: Add in  home exercise to make exercise part of routine and to increase amount of physical activity.;Long Term: Exercising regularly at least 3-5 days a week.       Increase Strength and Stamina Yes       Intervention Provide advice, education, support and counseling about physical activity/exercise needs.;Develop an individualized exercise prescription for aerobic and resistive training based on initial evaluation findings, risk stratification, comorbidities and participant's personal goals.       Expected Outcomes Short Term: Increase workloads from initial exercise prescription for resistance, speed, and METs.;Short Term: Perform resistance training exercises routinely during rehab and add in resistance training at home;Long Term: Improve cardiorespiratory fitness, muscular endurance and strength as measured by increased METs and functional capacity ( )       Able to understand and use rate of perceived exertion (RPE) scale Yes       Intervention Provide education and explanation on how to use RPE scale       Expected Outcomes Short Term: Able to use RPE daily in rehab to express subjective intensity level;Long Term:  Able to use RPE to guide intensity level when exercising independently       Able to understand and use Dyspnea scale Yes       Intervention Provide education and explanation on how to use Dyspnea scale       Expected Outcomes Short Term: Able to use Dyspnea scale daily in rehab to express subjective sense of shortness of breath during exertion;Long Term: Able to use Dyspnea scale to guide intensity level when exercising independently       Knowledge and understanding of Target Heart Rate Range (THRR) Yes       Intervention Provide education and explanation of THRR including how the numbers were predicted and where they are located for reference       Expected Outcomes Short Term: Able to state/look up THRR;Short Term: Able to use daily  as guideline for intensity in rehab;Long Term: Able to use THRR to govern intensity when exercising independently       Able to check pulse independently Yes       Intervention Provide education and demonstration on how to check pulse in carotid and radial arteries.;Review the importance of being able to check your own pulse for safety during independent exercise       Expected Outcomes Short Term: Able to explain why pulse checking is important during independent exercise;Long Term: Able to check pulse independently and accurately       Understanding of Exercise Prescription Yes       Intervention Provide education, explanation, and written materials on patient's individual exercise prescription       Expected Outcomes Short Term: Able to explain program exercise prescription;Long Term: Able to explain home exercise prescription to exercise independently          Exercise Goals Re-Evaluation :  Exercise Goals Re-Evaluation     Row Name 07/28/23 1117 08/06/23 1747 08/20/23 1756 09/02/23 1546       Exercise Goal Re-Evaluation   Exercise Goals Review Able to understand and use rate of perceived exertion (RPE) scale;Able to understand and use Dyspnea scale;Knowledge and understanding of Target Heart Rate Range (THRR);Understanding of Exercise Prescription Increase Physical Activity;Understanding of Exercise Prescription;Increase Strength and Stamina Increase Physical Activity;Understanding of Exercise Prescription;Increase Strength and Stamina Increase Physical Activity;Understanding of Exercise Prescription;Increase Strength and Stamina    Comments Reviewed RPE and dyspnea scale, THR and program prescription with pt today.  Pt voiced understanding  and was given a copy of goals to take home. Allyana is off to a good start in the program and tolerated her exercise prescription well. She was able to do level 2 on the recumbent bike and T4 nustep. She also was able to do level 1 on the T5 nustep. We will  continue to monitor her progress in the program. Banesa is off to a good start in the program. She walked 15 laps on the track. She was able to maintain level 2 on the recumbent bike and increased to 25 watts. She also maintained level 2 on the biostep and T4 nustep. She also maintained level 1 on the T5 nustep. We will continue to monitor her progress in the program. Jacquilyn is doing well in rehab. She increased to level 6 on the T4 nustep and level 2 on the T5 nustep. She also tried the treadmill at a speed of 1 mph with no incline. We will continue to monitor her progress in the program.    Expected Outcomes Short: Use RPE daily to regulate intensity. Long: Follow program prescription in THR. Short: Continue to follow current exercise prescription. Long: Continue exercise to improve strength and stamina. Short: Continue to progressively increase laps on the track and workload on the recumbent bike. Long: Continue exercise to improve strength and stamina. Short: Continue to progressively increase workload on the nusteps. Long: Continue exercise to improve strength and stamina.       Discharge Exercise Prescription (Final Exercise Prescription Changes):  Exercise Prescription Changes - 09/02/23 1500       Response to Exercise   Blood Pressure (Admit) 114/62    Blood Pressure (Exit) 116/66    Heart Rate (Admit) 97 bpm    Heart Rate (Exercise) 135 bpm    Heart Rate (Exit) 100 bpm    Oxygen Saturation (Admit) 91 %    Oxygen Saturation (Exercise) 90 %    Oxygen Saturation (Exit) 91 %    Rating of Perceived Exertion (Exercise) 15    Symptoms none    Duration Continue with 30 min of aerobic exercise without signs/symptoms of physical distress.    Intensity THRR unchanged      Progression   Progression Continue to progress workloads to maintain intensity without signs/symptoms of physical distress.    Average METs 1.82      Resistance Training   Training Prescription Yes    Weight 2 lb    Reps  10-15      Interval Training   Interval Training No      Treadmill   MPH 1    Grade 0    Minutes 15    METs 1.77      Recumbant Bike   Level 1    Watts 18    Minutes 15    METs 2.7      NuStep   Level 6    Minutes 15    METs 0.9      T5 Nustep   Level 2    Minutes 15    METs 1.9      Oxygen   Maintain Oxygen Saturation 88% or higher          Nutrition:  Target Goals: Understanding of nutrition guidelines, daily intake of sodium 1500mg , cholesterol 200mg , calories 30% from fat and 7% or less from saturated fats, daily to have 5 or more servings of fruits and vegetables.  Education: All About Nutrition: -Group instruction provided by verbal, written material, interactive activities,  discussions, models, and posters to present general guidelines for heart healthy nutrition including fat, fiber, MyPlate, the role of sodium in heart healthy nutrition, utilization of the nutrition label, and utilization of this knowledge for meal planning. Follow up email sent as well. Written material given at graduation.   Biometrics:  Pre Biometrics - 07/22/23 1551       Pre Biometrics   Height 5' 3.2 (1.605 m)    Weight 180 lb 4.8 oz (81.8 kg)    Waist Circumference 40 inches    Hip Circumference 44 inches    Waist to Hip Ratio 0.91 %    BMI (Calculated) 31.75    Single Leg Stand 30 seconds           Nutrition Therapy Plan and Nutrition Goals:  Nutrition Therapy & Goals - 07/28/23 1116       Nutrition Therapy   Diet liberaliazed due to poor appetite    Protein (specify units) >75g    Fiber 25 grams    Whole Grain Foods 3 servings    Saturated Fats 15 max. grams    Fruits and Vegetables 5 servings/day    Sodium 2 grams      Personal Nutrition Goals   Nutrition Goal Eat 3 times per day, small frequent meals or nutrient dense snacks    Personal Goal #2 Eat 15-30gProtein and 30-60gCarbs at each meal.    Comments Patient on selexipag  and reports it has ruined her  appetite and when she does eat she feels bloated quickly experiences diarrhea. She sometimes gets used to the medication but then the does is increased and then she struggles again. Spoke with her today about prioritizing quality over quantity. To make sure the little food she can eat and tolerate helps her meet her nutritional goals. Provided handout with several small meal and snack ideas focusing on balance of complex carbs and protein. Educated on types of fats, sources and how to add more calories to less quantity of food.      Intervention Plan   Intervention Prescribe, educate and counsel regarding individualized specific dietary modifications aiming towards targeted core components such as weight, hypertension, lipid management, diabetes, heart failure and other comorbidities.;Nutrition handout(s) given to patient.    Expected Outcomes Short Term Goal: Understand basic principles of dietary content, such as calories, fat, sodium, cholesterol and nutrients.;Short Term Goal: A plan has been developed with personal nutrition goals set during dietitian appointment.;Long Term Goal: Adherence to prescribed nutrition plan.          Nutrition Assessments:  MEDIFICTS Score Key: >=70 Need to make dietary changes  40-70 Heart Healthy Diet <= 40 Therapeutic Level Cholesterol Diet   Picture Your Plate Scores: <59 Unhealthy dietary pattern with much room for improvement. 41-50 Dietary pattern unlikely to meet recommendations for good health and room for improvement. 51-60 More healthful dietary pattern, with some room for improvement.  >60 Healthy dietary pattern, although there may be some specific behaviors that could be improved.   Nutrition Goals Re-Evaluation:   Nutrition Goals Discharge (Final Nutrition Goals Re-Evaluation):   Psychosocial: Target Goals: Acknowledge presence or absence of significant depression and/or stress, maximize coping skills, provide positive support system.  Participant is able to verbalize types and ability to use techniques and skills needed for reducing stress and depression.   Education: Stress, Anxiety, and Depression - Group verbal and visual presentation to define topics covered.  Reviews how body is impacted by stress, anxiety, and depression.  Also discusses  healthy ways to reduce stress and to treat/manage anxiety and depression.  Written material given at graduation.   Education: Sleep Hygiene -Provides group verbal and written instruction about how sleep can affect your health.  Define sleep hygiene, discuss sleep cycles and impact of sleep habits. Review good sleep hygiene tips.    Initial Review & Psychosocial Screening:  Initial Psych Review & Screening - 07/20/23 1339       Initial Review   Current issues with None Identified      Family Dynamics   Good Support System? Yes    Comments Timmie states that she lives with her son and has a good family support system.She states no mental instability.      Barriers   Psychosocial barriers to participate in program The patient should benefit from training in stress management and relaxation.;There are no identifiable barriers or psychosocial needs.      Screening Interventions   Interventions Encouraged to exercise;To provide support and resources with identified psychosocial needs;Provide feedback about the scores to participant    Expected Outcomes Short Term goal: Utilizing psychosocial counselor, staff and physician to assist with identification of specific Stressors or current issues interfering with healing process. Setting desired goal for each stressor or current issue identified.;Long Term Goal: Stressors or current issues are controlled or eliminated.;Short Term goal: Identification and review with participant of any Quality of Life or Depression concerns found by scoring the questionnaire.;Long Term goal: The participant improves quality of Life and PHQ9 Scores as seen by post  scores and/or verbalization of changes          Quality of Life Scores:  Scores of 19 and below usually indicate a poorer quality of life in these areas.  A difference of  2-3 points is a clinically meaningful difference.  A difference of 2-3 points in the total score of the Quality of Life Index has been associated with significant improvement in overall quality of life, self-image, physical symptoms, and general health in studies assessing change in quality of life.  PHQ-9: Review Flowsheet  More data exists      07/22/2023 12/10/2022 06/17/2021 06/13/2021 06/03/2021  Depression screen PHQ 2/9  Decreased Interest 0 1 1 0 0 0  Down, Depressed, Hopeless 0 1 1 0 0 0  PHQ - 2 Score 0 2 2 0 0 0  Altered sleeping 0 3 3 2  - -  Tired, decreased energy 3 3 3 3  - -  Change in appetite 2 1 2  0 - -  Feeling bad or failure about yourself  0 -- 0 1 - -  Trouble concentrating 0 0 0 1 - -  Moving slowly or fidgety/restless 0 0 1 0 - -  Suicidal thoughts 0 -- 0 0 - -  PHQ-9 Score 5 9 11 7  - -  Difficult doing work/chores - Very difficult Very difficult - - -    Details       Multiple values from one day are sorted in reverse-chronological order        Interpretation of Total Score  Total Score Depression Severity:  1-4 = Minimal depression, 5-9 = Mild depression, 10-14 = Moderate depression, 15-19 = Moderately severe depression, 20-27 = Severe depression   Psychosocial Evaluation and Intervention:  Psychosocial Evaluation - 07/20/23 1341       Psychosocial Evaluation & Interventions   Interventions Encouraged to exercise with the program and follow exercise prescription;Relaxation education;Stress management education    Comments Ameli states that she  lives with her son and has a good family support system.She states no mental instability.    Expected Outcomes Short: Start [facility] to help with mood. Long: Maintain a healthy mental state    Continue Psychosocial Services  Follow up  required by staff          Psychosocial Re-Evaluation:   Psychosocial Discharge (Final Psychosocial Re-Evaluation):   Education: Education Goals: Education classes will be provided on a weekly basis, covering required topics. Participant will state understanding/return demonstration of topics presented.  Learning Barriers/Preferences:  Learning Barriers/Preferences - 07/20/23 1336       Learning Barriers/Preferences   Learning Barriers None    Learning Preferences None          General Pulmonary Education Topics:  Infection Prevention: - Provides verbal and written material to individual with discussion of infection control including proper hand washing and proper equipment cleaning during exercise session. Flowsheet Row Pulmonary Rehab from 07/22/2023 in [facility]  Date 07/22/23  Educator MB  Instruction Review Code 1- Verbalizes Understanding    Falls Prevention: - Provides verbal and written material to individual with discussion of falls prevention and safety. Flowsheet Row Pulmonary Rehab from 07/22/2023 in [facility]  Date 07/22/23  Educator MB  Instruction Review Code 1- Verbalizes Understanding    Chronic Lung Disease Review: - Group verbal instruction with posters, models, PowerPoint presentations and videos,  to review new updates, new respiratory medications, new advancements in procedures and treatments. Providing information on websites and 800 numbers for continued self-education. Includes information about supplement oxygen, available portable oxygen systems, continuous and intermittent flow rates, oxygen safety, concentrators, and Medicare reimbursement for oxygen. Explanation of Pulmonary Drugs, including class, frequency, complications, importance of spacers, rinsing mouth after steroid MDI's, and proper cleaning methods for nebulizers. Review of basic lung anatomy and physiology related to function,  structure, and complications of lung disease. Review of risk factors. Discussion about methods for diagnosing sleep apnea and types of masks and machines for OSA. Includes a review of the use of types of environmental controls: home humidity, furnaces, filters, dust mite/pet prevention, HEPA vacuums. Discussion about weather changes, air quality and the benefits of nasal washing. Instruction on Warning signs, infection symptoms, calling MD promptly, preventive modes, and value of vaccinations. Review of effective airway clearance, coughing and/or vibration techniques. Emphasizing that all should Create an Action Plan. Written material given at graduation.   AED/CPR: - Group verbal and written instruction with the use of models to demonstrate the basic use of the AED with the basic ABC's of resuscitation.    Anatomy and Cardiac Procedures: - Group verbal and visual presentation and models provide information about basic cardiac anatomy and function. Reviews the testing methods done to diagnose heart disease and the outcomes of the test results. Describes the treatment choices: Medical Management, Angioplasty, or Coronary Bypass Surgery for treating various heart conditions including Myocardial Infarction, Angina, Valve Disease, and Cardiac Arrhythmias.  Written material given at graduation.   Medication Safety: - Group verbal and visual instruction to review commonly prescribed medications for heart and lung disease. Reviews the medication, class of the drug, and side effects. Includes the steps to properly store meds and maintain the prescription regimen.  Written material given at graduation.   Other: -Provides group and verbal instruction on various topics (see comments)   Knowledge Questionnaire Score:    Core Components/Risk Factors/Patient Goals at Admission:  Personal Goals and Risk Factors at Admission -  07/22/23 1552       Core Components/Risk Factors/Patient Goals on Admission     Weight Management Yes;Weight Loss    Intervention Weight Management: Develop a combined nutrition and exercise program designed to reach desired caloric intake, while maintaining appropriate intake of nutrient and fiber, sodium and fats, and appropriate energy expenditure required for the weight goal.;Weight Management: Provide education and appropriate resources to help participant work on and attain dietary goals.;Weight Management/Obesity: Establish reasonable short term and long term weight goals.    Admit Weight 180 lb 4.8 oz (81.8 kg)    Goal Weight: Short Term 170 lb (77.1 kg)    Goal Weight: Long Term 160 lb (72.6 kg)    Expected Outcomes Short Term: Continue to assess and modify interventions until short term weight is achieved;Weight Loss: Understanding of general recommendations for a balanced deficit meal plan, which promotes 1-2 lb weight loss per week and includes a negative energy balance of [phone] kcal/d;Understanding recommendations for meals to include 15-35% energy as protein, 25-35% energy from fat, 35-60% energy from carbohydrates, less than 200mg  of dietary cholesterol, 20-35 gm of total fiber daily;Understanding of distribution of calorie intake throughout the day with the consumption of 4-5 meals/snacks;Long Term: Adherence to nutrition and physical activity/exercise program aimed toward attainment of established weight goal    Improve shortness of breath with ADL's Yes    Intervention Provide education, individualized exercise plan and daily activity instruction to help decrease symptoms of SOB with activities of daily living.    Expected Outcomes Short Term: Improve cardiorespiratory fitness to achieve a reduction of symptoms when performing ADLs;Long Term: Be able to perform more ADLs without symptoms or delay the onset of symptoms    Hypertension Yes    Intervention Provide education on lifestyle modifcations including regular physical activity/exercise, weight management,  moderate sodium restriction and increased consumption of fresh fruit, vegetables, and low fat dairy, alcohol moderation, and smoking cessation.;Monitor prescription use compliance.    Expected Outcomes Short Term: Continued assessment and intervention until BP is < 140/30mm HG in hypertensive participants. < 130/36mm HG in hypertensive participants with diabetes, heart failure or chronic kidney disease.;Long Term: Maintenance of blood pressure at goal levels.          Education:Diabetes - Individual verbal and written instruction to review signs/symptoms of diabetes, desired ranges of glucose level fasting, after meals and with exercise. Acknowledge that pre and post exercise glucose checks will be done for 3 sessions at entry of program.   Know Your Numbers and Heart Failure: - Group verbal and visual instruction to discuss disease risk factors for cardiac and pulmonary disease and treatment options.  Reviews associated critical values for Overweight/Obesity, Hypertension, Cholesterol, and Diabetes.  Discusses basics of heart failure: signs/symptoms and treatments.  Introduces Heart Failure Zone chart for action plan for heart failure.  Written material given at graduation.   Core Components/Risk Factors/Patient Goals Review:    Core Components/Risk Factors/Patient Goals at Discharge (Final Review):    ITP Comments:  ITP Comments     Row Name 07/20/23 1338 07/22/23 1541 07/28/23 1117 08/12/23 1024 09/09/23 1224   ITP Comments Virtual Visit completed. Patient informed on EP and RD appointment and 6 Minute walk test. Patient also informed of patient health questionnaires on My Chart. Patient Verbalizes understanding. Visit diagnosis can be found in [facility] 06/05/2023. Completed and gym orientation for respiratory care services. Initial ITP created and sent for review to Dr. Faud Aleskerov, Medical Director. First full day of  exercise!  Patient was oriented to gym and equipment including  functions, settings, policies, and procedures.  Patient's individual exercise prescription and treatment plan were reviewed.  All starting workloads were established based on the results of the 6 minute walk test done at initial orientation visit.  The plan for exercise progression was also introduced and progression will be customized based on patient's performance and goals. 30 Day review completed. Medical Director ITP review done, changes made as directed, and signed approval by Medical Director.    new to program 30 Day review completed. Medical Director ITP review done, changes made as directed, and signed approval by Medical Director.      Comments: 30 day review

## 2023-09-10 ENCOUNTER — Telehealth: Payer: Self-pay | Admitting: Nurse Practitioner

## 2023-09-10 ENCOUNTER — Encounter

## 2023-09-10 DIAGNOSIS — Z7689 Persons encountering health services in other specified circumstances: Secondary | ICD-10-CM | POA: Diagnosis not present

## 2023-09-10 NOTE — Telephone Encounter (Signed)
 Patient dropped off [employer] Form to be filled out by provider. Patient is requesting a call back at [phone] within 2-5 days when completed. Document is located in providers folder.

## 2023-09-14 DIAGNOSIS — Z7689 Persons encountering health services in other specified circumstances: Secondary | ICD-10-CM | POA: Diagnosis not present

## 2023-09-14 NOTE — Telephone Encounter (Signed)
 Forms received and started. Given to provider to complete and sign.

## 2023-09-15 ENCOUNTER — Encounter

## 2023-09-15 ENCOUNTER — Encounter: Payer: Self-pay | Admitting: Neurology

## 2023-09-15 NOTE — Telephone Encounter (Signed)
 Called and notified patient that forms have been completed for her. Patient asked to have these faxed.   Faxed forms in for patient. Copy placed in scan bin and original placed in bin to be picked up.

## 2023-09-17 ENCOUNTER — Encounter

## 2023-09-17 DIAGNOSIS — Z7689 Persons encountering health services in other specified circumstances: Secondary | ICD-10-CM | POA: Diagnosis not present

## 2023-09-18 ENCOUNTER — Other Ambulatory Visit: Payer: Self-pay

## 2023-09-19 DIAGNOSIS — Z419 Encounter for procedure for purposes other than remedying health state, unspecified: Secondary | ICD-10-CM | POA: Diagnosis not present

## 2023-09-22 ENCOUNTER — Encounter

## 2023-09-23 NOTE — Telephone Encounter (Unsigned)
 Copied from CRM [id number]. Topic: General - Other >> Sep 23, 2023  2:56 PM Marissa P wrote: Reason for CRM: Patient called in regards to below:   [name] SAILOR, [facility]    09/15/23 10:53 AM Note Called and notified patient that forms have been completed for her. Patient asked to have these faxed.    Faxed forms in for patient. Copy placed in scan bin and original placed in bin to be picked up.    Advised her of details, no further questions. >> Sep 23, 2023  3:14 PM [name] wrote: Patient called her application was denied.. She adv they rcvd the fax BUT - it is incomplete.. It is missing the diagnosis code or write something, page 3 its missing an answer above the drs signature.[name] Pls correct and refax.. their number of the CAP program: [phone] fax: [phone]

## 2023-09-24 ENCOUNTER — Ambulatory Visit (HOSPITAL_COMMUNITY)
Admission: RE | Admit: 2023-09-24 | Discharge: 2023-09-24 | Disposition: A | Discharge status: Home / Self Care | Source: Ambulatory Visit | Attending: Cardiology | Admitting: Cardiology

## 2023-09-24 ENCOUNTER — Telehealth (HOSPITAL_COMMUNITY): Payer: Self-pay | Admitting: Pharmacy Technician

## 2023-09-24 ENCOUNTER — Other Ambulatory Visit (HOSPITAL_COMMUNITY): Payer: Self-pay

## 2023-09-24 ENCOUNTER — Ambulatory Visit (HOSPITAL_COMMUNITY): Payer: Self-pay | Admitting: Cardiology

## 2023-09-24 ENCOUNTER — Encounter

## 2023-09-24 ENCOUNTER — Encounter (HOSPITAL_COMMUNITY): Payer: Self-pay | Admitting: Cardiology

## 2023-09-24 VITALS — BP 122/78 | HR 120 | Ht 63.0 in | Wt 178.4 lb

## 2023-09-24 DIAGNOSIS — E039 Hypothyroidism, unspecified: Secondary | ICD-10-CM | POA: Diagnosis not present

## 2023-09-24 DIAGNOSIS — Z853 Personal history of malignant neoplasm of breast: Secondary | ICD-10-CM | POA: Diagnosis not present

## 2023-09-24 DIAGNOSIS — M545 Low back pain, unspecified: Secondary | ICD-10-CM | POA: Diagnosis not present

## 2023-09-24 DIAGNOSIS — Z9221 Personal history of antineoplastic chemotherapy: Secondary | ICD-10-CM | POA: Insufficient documentation

## 2023-09-24 DIAGNOSIS — G8929 Other chronic pain: Secondary | ICD-10-CM | POA: Insufficient documentation

## 2023-09-24 DIAGNOSIS — Z7689 Persons encountering health services in other specified circumstances: Secondary | ICD-10-CM | POA: Diagnosis not present

## 2023-09-24 DIAGNOSIS — G4733 Obstructive sleep apnea (adult) (pediatric): Secondary | ICD-10-CM | POA: Insufficient documentation

## 2023-09-24 DIAGNOSIS — R000 Tachycardia, unspecified: Secondary | ICD-10-CM | POA: Insufficient documentation

## 2023-09-24 DIAGNOSIS — I10 Essential (primary) hypertension: Secondary | ICD-10-CM | POA: Diagnosis not present

## 2023-09-24 DIAGNOSIS — I272 Pulmonary hypertension, unspecified: Secondary | ICD-10-CM | POA: Insufficient documentation

## 2023-09-24 DIAGNOSIS — Z79899 Other long term (current) drug therapy: Secondary | ICD-10-CM | POA: Diagnosis not present

## 2023-09-24 DIAGNOSIS — M797 Fibromyalgia: Secondary | ICD-10-CM | POA: Diagnosis not present

## 2023-09-24 LAB — CBC
HCT: 46.1 % — ABNORMAL HIGH (ref 36.0–46.0)
Hemoglobin: 15.2 g/dL — ABNORMAL HIGH (ref 12.0–15.0)
MCH: 28.1 pg (ref 26.0–34.0)
MCHC: 33 g/dL (ref 30.0–36.0)
MCV: 85.4 fL (ref 80.0–100.0)
Platelets: 334 K/uL (ref 150–400)
RBC: 5.4 MIL/uL — ABNORMAL HIGH (ref 3.87–5.11)
RDW: 13.9 % (ref 11.5–15.5)
WBC: 5.7 K/uL (ref 4.0–10.5)
nRBC: 0 % (ref 0.0–0.2)

## 2023-09-24 LAB — BASIC METABOLIC PANEL WITH GFR
Anion gap: 11 (ref 5–15)
BUN: 16 mg/dL (ref 6–20)
CO2: 23 mmol/L (ref 22–32)
Calcium: 9.6 mg/dL (ref 8.9–10.3)
Chloride: 107 mmol/L (ref 98–111)
Creatinine, Ser: 0.81 mg/dL (ref 0.44–1.00)
GFR, Estimated: >60 mL/min (ref >60)
Glucose, Bld: 115 mg/dL — ABNORMAL HIGH (ref 70–99)
Potassium: 3.5 mmol/L (ref 3.5–5.1)
Sodium: 141 mmol/L (ref 135–145)

## 2023-09-24 LAB — BRAIN NATRIURETIC PEPTIDE: B Natriuretic Peptide: 5.8 pg/mL (ref 0.0–100.0)

## 2023-09-24 NOTE — Telephone Encounter (Signed)
 Advanced Heart Failure Patient Advocate Encounter  Prior Authorization for Imelda has been approved.    PA# [id number] Effective dates: 09/24/23 through 09/23/24  Patients co-pay is $4 (21 days)  [name] Pa, CPhT

## 2023-09-24 NOTE — Patient Instructions (Addendum)
 We will notify about your new medication once it gets approved.  Labs done today, your results will be available in MyChart, we will contact you for abnormal readings.  Your physician recommends that you schedule a follow-up appointment in: 2 months.  If you have any questions or concerns before your next appointment please send us  a message through [place] or call our office at [phone].    TO LEAVE A MESSAGE FOR THE NURSE SELECT OPTION 2, PLEASE LEAVE A MESSAGE INCLUDING: YOUR NAME DATE OF BIRTH CALL BACK NUMBER REASON FOR CALL**this is important as we prioritize the call backs  YOU WILL RECEIVE A CALL BACK THE SAME DAY AS LONG AS YOU CALL BEFORE 4:00 PM  At the [facility], you and your health needs are our priority. As part of our continuing mission to provide you with exceptional heart care, we have created designated Provider Care Teams. These Care Teams include your primary Cardiologist (physician) and Advanced Practice Providers (APPs- Physician Assistants and Nurse Practitioners) who all work together to provide you with the care you need, when you need it.   You may see any of the following providers on your designated Care Team at your next follow up: Dr Toribio Fuel Dr Ezra Shuck Dr. Ria Commander Dr. Morene Brownie Amy Lenetta, NP Caffie Shed, [place] [facility] [place], [place] Beckey Coe, NP [place] Lee, NP Ellouise Class, NP Tinnie Redman, PharmD Jaun Bash, PharmD   Please be sure to bring in all your medications bottles to every appointment.    Thank you for choosing [facility]

## 2023-09-24 NOTE — Telephone Encounter (Signed)
 Advanced Heart Failure Patient Advocate Encounter  Patient was started on Winrevair in clinic today. Will send in referral paperwork once all signatures are received.

## 2023-09-24 NOTE — Progress Notes (Signed)
 6 Min Walk Test Completed  Pt ambulated 1300 ft (396.2 m) O2 Sat ranged 94%-89% on room air HR ranged 90-126

## 2023-09-24 NOTE — Telephone Encounter (Signed)
 Patient Advocate Encounter   Received notification from [facility] that prior authorization for Winrevair is required.   PA submitted on CoverMyMeds Key [id number] Status is pending   Will continue to follow.

## 2023-09-24 NOTE — Progress Notes (Signed)
 ADVANCED HEART FAILURE CLINIC NOTE  Primary Care: Valerio Melanie DASEN, NP HF Cardiology: Dr. Rolan  Chief Complaint: Pulmonary hypertension  HPI: Tonya Odonnell is a 58 y.o. female with pulmonary hypertension, hypertension, breast cancer status postmastectomy and chemotherapy in 2017 complicated by severe neuropathic/psychosomatic pain, hypothyroidism and fibromyalgia.  She was admitted 10/24 at [facility] with progressive  dyspnea as well as pain.  Prior to that she was seen by [facility] rheumatology and diagnosed with fibromyalgia.  2 months prior to her hospitalization she reports having progressive exertional dyspnea with shortness of breath just walking to her mailbox.  2 weeks prior to admission this progressed to chest tightness both at rest and with exertion.  During her hospitalization, echocardiogram was done showing EF of 50 to 55% with D-shaped interventricular septum.  She underwent right and left heart catheterization that was significant for pulmonary hypertension with a PVR of 7.3 Wood units.  CTA chest during this admission showed no PE, no parenchymal lung disease.  PFTs in 10/24 were surprisingly normal.  V/Q scan in 10/24 showed no evidence for chronic PE.  She was subsequently started on tadalafil  20 mg and PH evaluation was started.  Sleep study showed mild OSA, pulmonary did not recommend CPAP.   In 1/25, she developed appendicitis and had uncomplicated laparoscopic appendectomy at [facility].   Repeat RHC in 4/25 showed PA pressure 57/23 mean 35, CI 3.13, PVR 5.2 WU.  Echo in 6/25 showed EF 65-70%, mild RV dysfunction with normal size, IVC normal, unable to estimate PASP.   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  She was seen in the ER in 6/25 with severe vertigo.  MRI brain showed no CVA, this was thought to be due to BPPV.  She has had no recurrent vertigo since then.  She has not been very active, has been staying in the house given fear of recurrent  vertigo.  She is very anxious about this. She has chronic low back pain.  She has diarrhea likely from selexipag  but it is currently manageable.  No chest pain.  No dyspnea walking around her house.  No orthopnea/PND. She cut back her Lasix  to 20 mg daily. Weight down 5 lbs.   Labs (2/24): CCP negative, RF negative Labs (10/24): BNP 42, anti-SCL 70 negative, ANA negative, HIV negative Labs (11/24): K 4, creatinine 0.88 Labs (1/25): K 3.6, creatinine 0.86, LFTs normal, hgb 13.5, BNP 8 Labs (3/25): TSH normal Labs (6/25): K 4.5, creatinine 0.81, hgb 15.6  ECG (personally reviewed): sinus tachycardia 119  6 minute walk (12/24): 457 m 6 minute walk (3/25): 488 m 6 minute walk (7/25): 396 m  PMH: 1. Fibromyalgia: Has seen rheumatology at [facility].  2. HTN 3. Hypothyroidism 4. Breast cancer: 2017, Treated with chemotherapy and mastectomy.  5. Pulmonary hypertension: Echo (10/24) with EF 50-55%, D-shaped septum suggestive of RV pressure/volume overload, RV poorly visualized but appeared dilated and dysfunctional.   - RHC/LHC (10/24): No significant CAD; mean RA 11, PA 76/38 mean 52, mean PCWP 10, PVR 7.3 WU, CI 3.04 - V/Q scan (10/24): No evidence for chronic PE.  - CTA chest (10/24): No acute PE, no evidence for lung parenchymal disease.  - PFTs (10/24): FVC 92%, FEV1 102%, TLC 101%, DLCO normal (surprising).   - Sleep study (10/24): mild OSA.  - CCP/RF negative. Anti-SCL70, HIV, and ANA negative.  - RHC (4/25): mean RA 5, PA 57/23 mean 35, mean PCWP 5, CI 3.13, PVR 5.2 WU -  Echo (6/25): EF 65-70%, mild RV dysfunction with normal size, IVC normal, unable to estimate PASP.  6. Appendicitis: Appendectomy 1/25.  7. BPPV  SH: Nonsmoker, no ETOH, married, originally from [place].   FH: No pulmonary hypertension or cardiomyopathy that she knows of.   ROS: All systems reviewed and negative except as per HPI.    Current Outpatient Medications  Medication Sig Dispense Refill   ambrisentan   (LETAIRIS ) 10 MG tablet Take 1 tablet (10 mg total) by mouth daily. 30 tablet 11   Azelaic Acid  15 % gel Apply to face every morning 50 g 5   bismuth subsalicylate (PEPTO BISMOL) 262 MG/15ML suspension Take 30 mLs by mouth every 6 (six) hours as needed for diarrhea or loose stools.     clobetasol  cream (TEMOVATE ) 0.05 % Apply 1 Application topically as needed. 30 g 4   cromolyn  (OPTICROM ) 4 % ophthalmic solution Place 1 drop into both eyes 4 (four) times daily. (Patient taking differently: Place 1 drop into both eyes daily as needed (Dry eye).) 10 mL 4   ELIDEL  1 % cream Apply to red scaly areas on the face BID for seb derm 30 g 4   fluticasone  (FLONASE ) 50 MCG/ACT nasal spray Place 2 sprays into both nostrils daily as needed for allergies or rhinitis.     furosemide  (LASIX ) 40 MG tablet Take 1 tablet (40 mg total) by mouth daily. 90 tablet 1   hydrALAZINE  (APRESOLINE ) 25 MG tablet Take 1 tablet (25 mg total) by mouth 2 (two) times daily. 180 tablet 1   Ivermectin  (SOOLANTRA ) 1 % CREA Apply 1 Application topically at bedtime. 45 g 5   levothyroxine  (SYNTHROID ) 112 MCG tablet Take 1 pill everyday except Sunday take half a pill 90 tablet 1   meclizine  (ANTIVERT ) 25 MG tablet Take 1 tablet (25 mg total) by mouth 3 (three) times daily as needed for dizziness. 30 tablet 0   omeprazole  (PRILOSEC) 40 MG capsule Take 40 mg by mouth daily as needed (Heartburn).     ondansetron  (ZOFRAN ) 4 MG tablet Take 1 tablet (4 mg total) by mouth every 8 (eight) hours as needed for nausea or vomiting. 15 tablet 0   potassium chloride  SA (KLOR-CON  M) 20 MEQ tablet Take 1 tablet (20 mEq total) by mouth daily. 90 tablet 1   Selexipag  800 MCG TABS Take 1 tablet (800 mcg total) by mouth 2 (two) times daily. 60 tablet 11   tadalafil , PAH, (ADCIRCA ) 20 MG tablet Take 2 tablets (40 mg total) by mouth daily. NDC's covered by plan: 66657972190, [id number], [id number]     valACYclovir  (VALTREX ) 1000 MG tablet Take 1,000 mg by  mouth daily as needed (Cold Sores).     No current facility-administered medications for this encounter.    Allergies  Allergen Reactions   Lisinopril Other (See Comments)    cough     PHYSICAL EXAM: BP 122/78   Pulse (!) 120   Ht 5' 3 (1.6 m)   Wt 80.9 kg (178 lb 6.4 oz)   LMP  (LMP Unknown)   SpO2 96%   BMI 31.60 kg/m  General: NAD Neck: No JVD, no thyromegaly or thyroid  nodule.  Lungs: Clear to auscultation bilaterally with normal respiratory effort. CV: Nondisplaced PMI.  Heart mildly tachy regular S1/S2, no S3/S4, no murmur.  No peripheral edema.  No carotid bruit.  Normal pedal pulses.  Abdomen: Soft, nontender, no hepatosplenomegaly, no distention.  Skin: Intact without lesions or rashes.  Neurologic: Alert and oriented x  3.  Psych: Normal affect. Extremities: No clubbing or cyanosis.  HEENT: Normal.   ASSESSMENT & PLAN:  1. Pulmonary hypertension/RV failure: Echo in 10/24 showed EF 50-55%; RV was poorly visualized but there was a D-shaped interventricular septum and concern for RV dilation/dysfunction.  RHC/LHC in 10/24 showed no CAD; mean RA 11, PA 76/38 mean 52, mean PWP 10, PVR 7.3 WU and CI 3.04.  PE CT 10/24 showed no acute PE and no parenchymal lung disease.  V/Q scan 10/24 showed no chronic PE.  PFTs in 10/24 were normal (including, surprisingly, DLCO).  Rheumatology serologies and HIV were negative.  Sleep study showed mild OSA.  No FH of pulmonary hypertension.  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 Repeat echo in 6/25 appeared to show improved RV function; EF 65-70%, mild RV dysfunction with normal size, IVC normal, unable to estimate PASP.  Repeat RHC in 4/25 showed moderate pulmonary arterial hypertension (improved from prior RHC) with preserved cardiac output.  NYHA class II-III, not volume overloaded on exam.  6 minute walk is less today but she has been fairly inactive since her vertigo episode. She has sinus  tachycardia today but is also very anxious.  - Continue Lasix  20 mg daily.  BMET/BNP.    - Continue tadalafil  40 mg daily.  - Continue ambrisentan  10 mg daily.    - Continue selexipag  800 mcg bid. With ongoing diarrhea, she wants to hold off further increase for now.  - I will work on getting her started on sotatercept. CBC today.  - Would recommend that she restart pulmonary rehab (stopped after vertigo episode).  2. HTN: BP controlled, she is only taking hydralazine  25 mg bid.  3. Fibromyalgia: With diffuse pain.  Evaluation has not found a specific rheumatologic diagnosis.   She will followup with me in 2 months.   I spent 32 minutes reviewing records, interviewing/examining patient, and managing order   Ezra Shuck 09/24/2023

## 2023-09-24 NOTE — Telephone Encounter (Signed)
 Advanced Heart Failure Patient Advocate Encounter  [employer] referral along with PA approval sent to [facility] via ef[facility].   Fax number, [phone].  Phone number for follow up, [phone].  Will follow up.

## 2023-09-25 NOTE — Telephone Encounter (Signed)
 Advanced Heart Failure Patient Advocate Encounter  [employer] confirmed the referral has been received.   Will follow up.

## 2023-09-28 ENCOUNTER — Encounter: Payer: Self-pay | Admitting: *Deleted

## 2023-09-28 ENCOUNTER — Other Ambulatory Visit: Payer: Self-pay

## 2023-09-28 DIAGNOSIS — Z7689 Persons encountering health services in other specified circumstances: Secondary | ICD-10-CM | POA: Diagnosis not present

## 2023-09-28 DIAGNOSIS — I272 Pulmonary hypertension, unspecified: Secondary | ICD-10-CM

## 2023-09-28 NOTE — Progress Notes (Signed)
 Pulmonary Individual Treatment Plan  Patient Details  Name: Madalene Mickler MRN: [id number] Date of Birth: 09/12/1965 Referring Provider:   Flowsheet Row Pulmonary Rehab from 07/22/2023 in [facility] Cardiac and Pulmonary Rehab  Referring Provider Rolan Barrack, MD    Initial Encounter Date:  Flowsheet Row Pulmonary Rehab from 07/22/2023 in [facility] Cardiac and Pulmonary Rehab  Date 07/22/23    Visit Diagnosis: Pulmonary hypertension (HCC)  Patient's Home Medications on Admission:  Current Outpatient Medications:    ambrisentan  (LETAIRIS ) 10 MG tablet, Take 1 tablet (10 mg total) by mouth daily., Disp: 30 tablet, Rfl: 11   Azelaic Acid  15 % gel, Apply to face every morning, Disp: 50 g, Rfl: 5   bismuth subsalicylate (PEPTO BISMOL) 262 MG/15ML suspension, Take 30 mLs by mouth every 6 (six) hours as needed for diarrhea or loose stools., Disp: , Rfl:    clobetasol  cream (TEMOVATE ) 0.05 %, Apply 1 Application topically as needed., Disp: 30 g, Rfl: 4   cromolyn  (OPTICROM ) 4 % ophthalmic solution, Place 1 drop into both eyes 4 (four) times daily. (Patient taking differently: Place 1 drop into both eyes daily as needed (Dry eye).), Disp: 10 mL, Rfl: 4   ELIDEL  1 % cream, Apply to red scaly areas on the face BID for seb derm, Disp: 30 g, Rfl: 4   fluticasone  (FLONASE ) 50 MCG/ACT nasal spray, Place 2 sprays into both nostrils daily as needed for allergies or rhinitis., Disp: , Rfl:    furosemide  (LASIX ) 40 MG tablet, Take 1 tablet (40 mg total) by mouth daily., Disp: 90 tablet, Rfl: 1   hydrALAZINE  (APRESOLINE ) 25 MG tablet, Take 1 tablet (25 mg total) by mouth 2 (two) times daily., Disp: 180 tablet, Rfl: 1   Ivermectin  (SOOLANTRA ) 1 % CREA, Apply 1 Application topically at bedtime., Disp: 45 g, Rfl: 5   levothyroxine  (SYNTHROID ) 112 MCG tablet, Take 1 pill everyday except Sunday take half a pill, Disp: 90 tablet, Rfl: 1   meclizine  (ANTIVERT ) 25 MG tablet, Take 1 tablet (25 mg total) by mouth 3 (three) times  daily as needed for dizziness., Disp: 30 tablet, Rfl: 0   omeprazole  (PRILOSEC) 40 MG capsule, Take 40 mg by mouth daily as needed (Heartburn)., Disp: , Rfl:    ondansetron  (ZOFRAN ) 4 MG tablet, Take 1 tablet (4 mg total) by mouth every 8 (eight) hours as needed for nausea or vomiting., Disp: 15 tablet, Rfl: 0   potassium chloride  SA (KLOR-CON  M) 20 MEQ tablet, Take 1 tablet (20 mEq total) by mouth daily., Disp: 90 tablet, Rfl: 1   Selexipag  800 MCG TABS, Take 1 tablet (800 mcg total) by mouth 2 (two) times daily., Disp: 60 tablet, Rfl: 11   tadalafil , PAH, (ADCIRCA ) 20 MG tablet, Take 2 tablets (40 mg total) by mouth daily. NDC's covered by plan: [id number], [id number], [id number], Disp: , Rfl:    valACYclovir  (VALTREX ) 1000 MG tablet, Take 1,000 mg by mouth daily as needed (Cold Sores)., Disp: , Rfl:   Past Medical History: Past Medical History:  Diagnosis Date   Anemia    USE TO HAVE IT,NOT NOW,2015,2016   Arthritis    HIPS,KNEES,HANDS   Breast cancer in female (HCC)    Cancer (HCC)    LEFT   Hyperlipidemia    Hypertension    Low iron    Neuromuscular disorder (HCC)    MUSCLE PAIN,FIBROMYALGIA   Osteopenia    Pulmonary HTN (HCC)    PVC (premature ventricular contraction)    Thyroid  disease  Tobacco Use: Social History   Tobacco Use  Smoking Status Never  Smokeless Tobacco Never    Labs: Review Flowsheet  More data exists      Latest Ref Rng & Units 12/12/2022 12/22/2022 07/02/2023 08/19/2023 08/28/2023  Labs for ITP Cardiac and Pulmonary Rehab  Cholestrol 100 - 199 mg/dL - - - 723  -  LDL (calc) 0 - 99 mg/dL - - - 814  -  HDL-C >60 mg/dL - - - 73  -  Trlycerides 0 - 149 mg/dL - - - 892  -  Hemoglobin A1c 4.8 - 5.6 % - 5.7  - 5.2  -  PH, Arterial 7.35 - 7.45 7.399  - - - -  PCO2 arterial 32 - 48 mmHg 35.2  - - - -  Bicarbonate 20.0 - 28.0 mmol/L 21.8  24.0  - 23.9  23.5  - -  TCO2 22 - 32 mmol/L 23  25  - 25  25  - 22   Acid-base deficit 0.0 - 2.0 mmol/L 2.0   1.0  - 1.0  2.0  - -  O2 Saturation % 94  66  - 71  71  - -    Details       Multiple values from one day are sorted in reverse-chronological order          Pulmonary Assessment Scores:  Pulmonary Assessment Scores     Row Name 07/22/23 1551         mMRC Score   mMRC Score 4        UCSD: Self-administered rating of dyspnea associated with activities of daily living (ADLs) 6-point scale (0 = not at all to 5 = maximal or unable to do because of breathlessness)  Scoring Scores range from 0 to 120.  Minimally important difference is 5 units  CAT: CAT can identify the health impairment of COPD patients and is better correlated with disease progression.  CAT has a scoring range of zero to 40. The CAT score is classified into four groups of low (less than 10), medium (10 - 20), high (21-30) and very high (31-40) based on the impact level of disease on health status. A CAT score over 10 suggests significant symptoms.  A worsening CAT score could be explained by an exacerbation, poor medication adherence, poor inhaler technique, or progression of COPD or comorbid conditions.  CAT MCID is 2 points  mMRC: mMRC (Modified Medical Research Council) Dyspnea Scale is used to assess the degree of baseline functional disability in patients of respiratory disease due to dyspnea. No minimal important difference is established. A decrease in score of 1 point or greater is considered a positive change.   Pulmonary Function Assessment:  Pulmonary Function Assessment - 07/20/23 1335       Breath   Shortness of Breath Yes;Limiting activity          Exercise Target Goals: Exercise Program Goal: Individual exercise prescription set using results from initial 6 min walk test and THRR while considering  patient's activity barriers and safety.   Exercise Prescription Goal: Initial exercise prescription builds to 30-45 minutes a day of aerobic activity, 2-3 days per week.  Home exercise  guidelines will be given to patient during program as part of exercise prescription that the participant will acknowledge.  Education: Aerobic Exercise: - Group verbal and visual presentation on the components of exercise prescription. Introduces F.I.T.T principle from ACSM for exercise prescriptions.  Reviews F.I.T.T. principles of aerobic exercise including progression.  Written material given at graduation.   Education: Resistance Exercise: - Group verbal and visual presentation on the components of exercise prescription. Introduces F.I.T.T principle from ACSM for exercise prescriptions  Reviews F.I.T.T. principles of resistance exercise including progression. Written material given at graduation.    Education: Exercise & Equipment Safety: - Individual verbal instruction and demonstration of equipment use and safety with use of the equipment. Flowsheet Row Pulmonary Rehab from 07/22/2023 in [facility] Cardiac and Pulmonary Rehab  Date 07/22/23  Educator MB  Instruction Review Code 1- Verbalizes Understanding    Education: Exercise Physiology & General Exercise Guidelines: - Group verbal and written instruction with models to review the exercise physiology of the cardiovascular system and associated critical values. Provides general exercise guidelines with specific guidelines to those with heart or lung disease.    Education: Flexibility, Balance, Mind/Body Relaxation: - Group verbal and visual presentation with interactive activity on the components of exercise prescription. Introduces F.I.T.T principle from ACSM for exercise prescriptions. Reviews F.I.T.T. principles of flexibility and balance exercise training including progression. Also discusses the mind body connection.  Reviews various relaxation techniques to help reduce and manage stress (i.e. Deep breathing, progressive muscle relaxation, and visualization). Balance handout provided to take home. Written material given at  graduation.   Activity Barriers & Risk Stratification:  Activity Barriers & Cardiac Risk Stratification - 07/22/23 1544       Activity Barriers & Cardiac Risk Stratification   Activity Barriers Fibromyalgia;Back Problems;Shortness of Breath;Other (comment)    Comments Pain in whole body (the more she moves as the day goes on, the more pain there is), lymphodema          6 Minute Walk:  6 Minute Walk     Row Name 07/22/23 1541         6 Minute Walk   Phase Initial     Distance 1225 feet     Walk Time 6 minutes     # of Rest Breaks 0     MPH 2.32     METS 3.28     RPE 11     Perceived Dyspnea  0     VO2 Peak 11.48     Symptoms Yes (comment)     Comments Nerve pain (same pain as resting)     Resting HR 92 bpm     Resting BP 120/84     Resting Oxygen Saturation  90 %     Exercise Oxygen Saturation  during 6 min walk 90 %     Max Ex. HR 109 bpm     Max Ex. BP 132/88     2 Minute Post BP 126/88       Interval HR   1 Minute HR 100     2 Minute HR 107     3 Minute HR 109     4 Minute HR 108     5 Minute HR 108     6 Minute HR 108     2 Minute Post HR 89     Interval Heart Rate? Yes       Interval Oxygen   Interval Oxygen? Yes     Baseline Oxygen Saturation % 90 %     1 Minute Oxygen Saturation % 90 %     1 Minute Liters of Oxygen 0 L     2 Minute Oxygen Saturation % 90 %     2 Minute Liters of Oxygen 0 L     3  Minute Oxygen Saturation % 91 %     3 Minute Liters of Oxygen 0 L     4 Minute Oxygen Saturation % 91 %     4 Minute Liters of Oxygen 0 L     5 Minute Oxygen Saturation % 92 %     5 Minute Liters of Oxygen 0 L     6 Minute Oxygen Saturation % 94 %     6 Minute Liters of Oxygen 0 L     2 Minute Post Oxygen Saturation % 94 %     2 Minute Post Liters of Oxygen 0 L       Oxygen Initial Assessment:  Oxygen Initial Assessment - 07/20/23 1334       Home Oxygen   Home Oxygen Device None    Sleep Oxygen Prescription None    Home Exercise Oxygen  Prescription None    Home Resting Oxygen Prescription None      Initial 6 min Walk   Oxygen Used None      Program Oxygen Prescription   Program Oxygen Prescription None      Intervention   Short Term Goals To learn and understand importance of monitoring SPO2 with pulse oximeter and demonstrate accurate use of the pulse oximeter.;To learn and demonstrate proper pursed lip breathing techniques or other breathing techniques. ;To learn and exhibit compliance with exercise, home and travel O2 prescription;To learn and understand importance of maintaining oxygen saturations>88%;To learn and demonstrate proper use of respiratory medications    Long  Term Goals Exhibits compliance with exercise, home  and travel O2 prescription;Maintenance of O2 saturations>88%;Compliance with respiratory medication;Demonstrates proper use of MDI's;Verbalizes importance of monitoring SPO2 with pulse oximeter and return demonstration;Exhibits proper breathing techniques, such as pursed lip breathing or other method taught during program session          Oxygen Re-Evaluation:  Oxygen Re-Evaluation     Row Name 07/28/23 1117             Program Oxygen Prescription   Program Oxygen Prescription None         Home Oxygen   Home Oxygen Device None       Sleep Oxygen Prescription None       Home Exercise Oxygen Prescription None       Home Resting Oxygen Prescription None       Compliance with Home Oxygen Use Yes         Goals/Expected Outcomes   Short Term Goals To learn and demonstrate proper pursed lip breathing techniques or other breathing techniques.        Long  Term Goals Exhibits proper breathing techniques, such as pursed lip breathing or other method taught during program session       Comments Reviewed PLB technique with pt.  Talked about how it works and it's importance in maintaining their exercise saturations.       Goals/Expected Outcomes Short: Become more profiecient at using PLB. Long:  Become independent at using PLB.          Oxygen Discharge (Final Oxygen Re-Evaluation):  Oxygen Re-Evaluation - 07/28/23 1117       Program Oxygen Prescription   Program Oxygen Prescription None      Home Oxygen   Home Oxygen Device None    Sleep Oxygen Prescription None    Home Exercise Oxygen Prescription None    Home Resting Oxygen Prescription None    Compliance with Home Oxygen Use Yes  Goals/Expected Outcomes   Short Term Goals To learn and demonstrate proper pursed lip breathing techniques or other breathing techniques.     Long  Term Goals Exhibits proper breathing techniques, such as pursed lip breathing or other method taught during program session    Comments Reviewed PLB technique with pt.  Talked about how it works and it's importance in maintaining their exercise saturations.    Goals/Expected Outcomes Short: Become more profiecient at using PLB. Long: Become independent at using PLB.          Initial Exercise Prescription:  Initial Exercise Prescription - 07/22/23 1500       Date of Initial Exercise RX and Referring Provider   Date 07/22/23    Referring Provider Rolan Barrack, MD      Oxygen   Maintain Oxygen Saturation 88% or higher      Recumbant Bike   Level 2    RPM 50    Watts 33    Minutes 15    METs 3.28      NuStep   Level 2   T4 &T6   SPM 80    Minutes 15    METs 3.28      T5 Nustep   Level 2    SPM 80    Minutes 15    METs 3.28      Biostep-RELP   Level 2    SPM 50    Minutes 15    METs 3.28      Track   Laps 32    Minutes 15    METs 2.74      Prescription Details   Frequency (times per week) 2    Duration Progress to 30 minutes of continuous aerobic without signs/symptoms of physical distress      Intensity   THRR 40-80% of Max Heartrate 118-148    Ratings of Perceived Exertion 11-13    Perceived Dyspnea 0-4      Progression   Progression Continue to progress workloads to maintain intensity without  signs/symptoms of physical distress.      Resistance Training   Training Prescription Yes    Weight 2 lb    Reps 10-15          Perform Capillary Blood Glucose checks as needed.  Exercise Prescription Changes:   Exercise Prescription Changes     Row Name 07/22/23 1500 08/06/23 1700 08/20/23 1700 09/02/23 1500       Response to Exercise   Blood Pressure (Admit) 120/84 114/74 116/70 114/62    Blood Pressure (Exercise) 132/88 134/78 122/76 --    Blood Pressure (Exit) 126/88 108/64 108/70 116/66    Heart Rate (Admit) 92 bpm 97 bpm 102 bpm 97 bpm    Heart Rate (Exercise) 109 bpm 108 bpm 127 bpm 135 bpm    Heart Rate (Exit) 89 bpm 94 bpm 124 bpm 100 bpm    Oxygen Saturation (Admit) 90 % 95 % 95 % 91 %    Oxygen Saturation (Exercise) 90 % 90 % 90 % 90 %    Oxygen Saturation (Exit) 94 % 93 % 89 % 91 %    Rating of Perceived Exertion (Exercise) 11 15 15 15     Perceived Dyspnea (Exercise) 0 -- 1 --    Symptoms Nerve pain (same pain as resting) none nonw none    Comments results First 2 exercise sessions -- --    Duration Progress to 30 minutes of  aerobic without signs/symptoms of physical  distress Progress to 30 minutes of  aerobic without signs/symptoms of physical distress Continue with 30 min of aerobic exercise without signs/symptoms of physical distress. Continue with 30 min of aerobic exercise without signs/symptoms of physical distress.    Intensity THRR New THRR unchanged THRR unchanged THRR unchanged      Progression   Progression Continue to progress workloads to maintain intensity without signs/symptoms of physical distress. Continue to progress workloads to maintain intensity without signs/symptoms of physical distress. Continue to progress workloads to maintain intensity without signs/symptoms of physical distress. Continue to progress workloads to maintain intensity without signs/symptoms of physical distress.    Average METs 3.28 2.47 2.77 1.82      Resistance  Training   Training Prescription -- Yes Yes Yes    Weight -- 2 lb 2 lb 2 lb    Reps -- 10-15 10-15 10-15      Interval Training   Interval Training -- No No No      Treadmill   MPH -- -- -- 1    Grade -- -- -- 0    Minutes -- -- -- 15    METs -- -- -- 1.77      Recumbant Bike   Level -- 2 2 1     Watts -- 16 25 18     Minutes -- 15 15 15     METs -- 2.61 2.95 2.7      NuStep   Level -- 2 2 6     Minutes -- 15 15 15     METs -- 2.3 2 0.9      T5 Nustep   Level -- 1 1 2     Minutes -- 15 15 15     METs -- 2 1.9 1.9      Biostep-RELP   Level -- -- 2 --    Minutes -- -- 15 --    METs -- -- 5 --      Track   Laps -- -- 15 --    Minutes -- -- 15 --    METs -- -- 1.82 --      Oxygen   Maintain Oxygen Saturation -- 88% or higher 88% or higher 88% or higher       Exercise Comments:   Exercise Comments     Row Name 07/28/23 1117           Exercise Comments First full day of exercise!  Patient was oriented to gym and equipment including functions, settings, policies, and procedures.  Patient's individual exercise prescription and treatment plan were reviewed.  All starting workloads were established based on the results of the 6 minute walk test done at initial orientation visit.  The plan for exercise progression was also introduced and progression will be customized based on patient's performance and goals.          Exercise Goals and Review:   Exercise Goals     Row Name 07/22/23 1550             Exercise Goals   Increase Physical Activity Yes       Intervention Provide advice, education, support and counseling about physical activity/exercise needs.;Develop an individualized exercise prescription for aerobic and resistive training based on initial evaluation findings, risk stratification, comorbidities and participant's personal goals.       Expected Outcomes Short Term: Attend rehab on a regular basis to increase amount of physical activity.;Long Term: Add in  home exercise to make exercise part of routine and to increase  amount of physical activity.;Long Term: Exercising regularly at least 3-5 days a week.       Increase Strength and Stamina Yes       Intervention Provide advice, education, support and counseling about physical activity/exercise needs.;Develop an individualized exercise prescription for aerobic and resistive training based on initial evaluation findings, risk stratification, comorbidities and participant's personal goals.       Expected Outcomes Short Term: Increase workloads from initial exercise prescription for resistance, speed, and METs.;Short Term: Perform resistance training exercises routinely during rehab and add in resistance training at home;Long Term: Improve cardiorespiratory fitness, muscular endurance and strength as measured by increased METs and functional capacity ( )       Able to understand and use rate of perceived exertion (RPE) scale Yes       Intervention Provide education and explanation on how to use RPE scale       Expected Outcomes Short Term: Able to use RPE daily in rehab to express subjective intensity level;Long Term:  Able to use RPE to guide intensity level when exercising independently       Able to understand and use Dyspnea scale Yes       Intervention Provide education and explanation on how to use Dyspnea scale       Expected Outcomes Short Term: Able to use Dyspnea scale daily in rehab to express subjective sense of shortness of breath during exertion;Long Term: Able to use Dyspnea scale to guide intensity level when exercising independently       Knowledge and understanding of Target Heart Rate Range (THRR) Yes       Intervention Provide education and explanation of THRR including how the numbers were predicted and where they are located for reference       Expected Outcomes Short Term: Able to state/look up THRR;Short Term: Able to use daily as guideline for intensity in rehab;Long Term: Able to  use THRR to govern intensity when exercising independently       Able to check pulse independently Yes       Intervention Provide education and demonstration on how to check pulse in carotid and radial arteries.;Review the importance of being able to check your own pulse for safety during independent exercise       Expected Outcomes Short Term: Able to explain why pulse checking is important during independent exercise;Long Term: Able to check pulse independently and accurately       Understanding of Exercise Prescription Yes       Intervention Provide education, explanation, and written materials on patient's individual exercise prescription       Expected Outcomes Short Term: Able to explain program exercise prescription;Long Term: Able to explain home exercise prescription to exercise independently          Exercise Goals Re-Evaluation :  Exercise Goals Re-Evaluation     Row Name 07/28/23 1117 08/06/23 1747 08/20/23 1756 09/02/23 1546 09/14/23 1619     Exercise Goal Re-Evaluation   Exercise Goals Review Able to understand and use rate of perceived exertion (RPE) scale;Able to understand and use Dyspnea scale;Knowledge and understanding of Target Heart Rate Range (THRR);Understanding of Exercise Prescription Increase Physical Activity;Understanding of Exercise Prescription;Increase Strength and Stamina Increase Physical Activity;Understanding of Exercise Prescription;Increase Strength and Stamina Increase Physical Activity;Understanding of Exercise Prescription;Increase Strength and Stamina Increase Physical Activity;Understanding of Exercise Prescription;Increase Strength and Stamina   Comments Reviewed RPE and dyspnea scale, THR and program prescription with pt today.  Pt voiced understanding and was given  a copy of goals to take home. Kamalani is off to a good start in the program and tolerated her exercise prescription well. She was able to do level 2 on the recumbent bike and T4 nustep. She also  was able to do level 1 on the T5 nustep. We will continue to monitor her progress in the program. Mikhala is off to a good start in the program. She walked 15 laps on the track. She was able to maintain level 2 on the recumbent bike and increased to 25 watts. She also maintained level 2 on the biostep and T4 nustep. She also maintained level 1 on the T5 nustep. We will continue to monitor her progress in the program. Kahlen is doing well in rehab. She increased to level 6 on the T4 nustep and level 2 on the T5 nustep. She also tried the treadmill at a speed of 1 mph with no incline. We will continue to monitor her progress in the program. Quinci has not attended rehab since 6/19. We will continue to stay in contact with her to determine her status in the program.   Expected Outcomes Short: Use RPE daily to regulate intensity. Long: Follow program prescription in THR. Short: Continue to follow current exercise prescription. Long: Continue exercise to improve strength and stamina. Short: Continue to progressively increase laps on the track and workload on the recumbent bike. Long: Continue exercise to improve strength and stamina. Short: Continue to progressively increase workload on the nusteps. Long: Continue exercise to improve strength and stamina. Short: Return to rehab. Long: Continue exercise to improve strength and stamina.      Discharge Exercise Prescription (Final Exercise Prescription Changes):  Exercise Prescription Changes - 09/02/23 1500       Response to Exercise   Blood Pressure (Admit) 114/62    Blood Pressure (Exit) 116/66    Heart Rate (Admit) 97 bpm    Heart Rate (Exercise) 135 bpm    Heart Rate (Exit) 100 bpm    Oxygen Saturation (Admit) 91 %    Oxygen Saturation (Exercise) 90 %    Oxygen Saturation (Exit) 91 %    Rating of Perceived Exertion (Exercise) 15    Symptoms none    Duration Continue with 30 min of aerobic exercise without signs/symptoms of physical distress.    Intensity  THRR unchanged      Progression   Progression Continue to progress workloads to maintain intensity without signs/symptoms of physical distress.    Average METs 1.82      Resistance Training   Training Prescription Yes    Weight 2 lb    Reps 10-15      Interval Training   Interval Training No      Treadmill   MPH 1    Grade 0    Minutes 15    METs 1.77      Recumbant Bike   Level 1    Watts 18    Minutes 15    METs 2.7      NuStep   Level 6    Minutes 15    METs 0.9      T5 Nustep   Level 2    Minutes 15    METs 1.9      Oxygen   Maintain Oxygen Saturation 88% or higher          Nutrition:  Target Goals: Understanding of nutrition guidelines, daily intake of sodium 1500mg , cholesterol 200mg , calories 30% from fat and 7%  or less from saturated fats, daily to have 5 or more servings of fruits and vegetables.  Education: All About Nutrition: -Group instruction provided by verbal, written material, interactive activities, discussions, models, and posters to present general guidelines for heart healthy nutrition including fat, fiber, MyPlate, the role of sodium in heart healthy nutrition, utilization of the nutrition label, and utilization of this knowledge for meal planning. Follow up email sent as well. Written material given at graduation.   Biometrics:  Pre Biometrics - 07/22/23 1551       Pre Biometrics   Height 5' 3.2 (1.605 m)    Weight 180 lb 4.8 oz (81.8 kg)    Waist Circumference 40 inches    Hip Circumference 44 inches    Waist to Hip Ratio 0.91 %    BMI (Calculated) 31.75    Single Leg Stand 30 seconds           Nutrition Therapy Plan and Nutrition Goals:  Nutrition Therapy & Goals - 07/28/23 1116       Nutrition Therapy   Diet liberaliazed due to poor appetite    Protein (specify units) >75g    Fiber 25 grams    Whole Grain Foods 3 servings    Saturated Fats 15 max. grams    Fruits and Vegetables 5 servings/day    Sodium 2 grams       Personal Nutrition Goals   Nutrition Goal Eat 3 times per day, small frequent meals or nutrient dense snacks    Personal Goal #2 Eat 15-30gProtein and 30-60gCarbs at each meal.    Comments Patient on selexipag  and reports it has ruined her appetite and when she does eat she feels bloated quickly experiences diarrhea. She sometimes gets used to the medication but then the does is increased and then she struggles again. Spoke with her today about prioritizing quality over quantity. To make sure the little food she can eat and tolerate helps her meet her nutritional goals. Provided handout with several small meal and snack ideas focusing on balance of complex carbs and protein. Educated on types of fats, sources and how to add more calories to less quantity of food.      Intervention Plan   Intervention Prescribe, educate and counsel regarding individualized specific dietary modifications aiming towards targeted core components such as weight, hypertension, lipid management, diabetes, heart failure and other comorbidities.;Nutrition handout(s) given to patient.    Expected Outcomes Short Term Goal: Understand basic principles of dietary content, such as calories, fat, sodium, cholesterol and nutrients.;Short Term Goal: A plan has been developed with personal nutrition goals set during dietitian appointment.;Long Term Goal: Adherence to prescribed nutrition plan.          Nutrition Assessments:  MEDIFICTS Score Key: >=70 Need to make dietary changes  40-70 Heart Healthy Diet <= 40 Therapeutic Level Cholesterol Diet   Picture Your Plate Scores: <59 Unhealthy dietary pattern with much room for improvement. 41-50 Dietary pattern unlikely to meet recommendations for good health and room for improvement. 51-60 More healthful dietary pattern, with some room for improvement.  >60 Healthy dietary pattern, although there may be some specific behaviors that could be improved.   Nutrition Goals  Re-Evaluation:   Nutrition Goals Discharge (Final Nutrition Goals Re-Evaluation):   Psychosocial: Target Goals: Acknowledge presence or absence of significant depression and/or stress, maximize coping skills, provide positive support system. Participant is able to verbalize types and ability to use techniques and skills needed for reducing stress and depression.  Education: Stress, Anxiety, and Depression - Group verbal and visual presentation to define topics covered.  Reviews how body is impacted by stress, anxiety, and depression.  Also discusses healthy ways to reduce stress and to treat/manage anxiety and depression.  Written material given at graduation.   Education: Sleep Hygiene -Provides group verbal and written instruction about how sleep can affect your health.  Define sleep hygiene, discuss sleep cycles and impact of sleep habits. Review good sleep hygiene tips.    Initial Review & Psychosocial Screening:  Initial Psych Review & Screening - 07/20/23 1339       Initial Review   Current issues with None Identified      Family Dynamics   Good Support System? Yes    Comments Maija states that she lives with her son and has a good family support system.She states no mental instability.      Barriers   Psychosocial barriers to participate in program The patient should benefit from training in stress management and relaxation.;There are no identifiable barriers or psychosocial needs.      Screening Interventions   Interventions Encouraged to exercise;To provide support and resources with identified psychosocial needs;Provide feedback about the scores to participant    Expected Outcomes Short Term goal: Utilizing psychosocial counselor, staff and physician to assist with identification of specific Stressors or current issues interfering with healing process. Setting desired goal for each stressor or current issue identified.;Long Term Goal: Stressors or current issues are  controlled or eliminated.;Short Term goal: Identification and review with participant of any Quality of Life or Depression concerns found by scoring the questionnaire.;Long Term goal: The participant improves quality of Life and PHQ9 Scores as seen by post scores and/or verbalization of changes          Quality of Life Scores:  Scores of 19 and below usually indicate a poorer quality of life in these areas.  A difference of  2-3 points is a clinically meaningful difference.  A difference of 2-3 points in the total score of the Quality of Life Index has been associated with significant improvement in overall quality of life, self-image, physical symptoms, and general health in studies assessing change in quality of life.  PHQ-9: Review Flowsheet  More data exists      07/22/2023 12/10/2022 06/17/2021 06/13/2021 06/03/2021  Depression screen PHQ 2/9  Decreased Interest 0 1 1 0 0 0  Down, Depressed, Hopeless 0 1 1 0 0 0  PHQ - 2 Score 0 2 2 0 0 0  Altered sleeping 0 3 3 2  - -  Tired, decreased energy 3 3 3 3  - -  Change in appetite 2 1 2  0 - -  Feeling bad or failure about yourself  0 -- 0 1 - -  Trouble concentrating 0 0 0 1 - -  Moving slowly or fidgety/restless 0 0 1 0 - -  Suicidal thoughts 0 -- 0 0 - -  PHQ-9 Score 5 9 11 7  - -  Difficult doing work/chores - Very difficult Very difficult - - -    Details       Multiple values from one day are sorted in reverse-chronological order        Interpretation of Total Score  Total Score Depression Severity:  1-4 = Minimal depression, 5-9 = Mild depression, 10-14 = Moderate depression, 15-19 = Moderately severe depression, 20-27 = Severe depression   Psychosocial Evaluation and Intervention:  Psychosocial Evaluation - 07/20/23 1341  Psychosocial Evaluation & Interventions   Interventions Encouraged to exercise with the program and follow exercise prescription;Relaxation education;Stress management education    Comments Robecca  states that she lives with her son and has a good family support system.She states no mental instability.    Expected Outcomes Short: Start [facility] to help with mood. Long: Maintain a healthy mental state    Continue Psychosocial Services  Follow up required by staff          Psychosocial Re-Evaluation:   Psychosocial Discharge (Final Psychosocial Re-Evaluation):   Education: Education Goals: Education classes will be provided on a weekly basis, covering required topics. Participant will state understanding/return demonstration of topics presented.  Learning Barriers/Preferences:  Learning Barriers/Preferences - 07/20/23 1336       Learning Barriers/Preferences   Learning Barriers None    Learning Preferences None          General Pulmonary Education Topics:  Infection Prevention: - Provides verbal and written material to individual with discussion of infection control including proper hand washing and proper equipment cleaning during exercise session. Flowsheet Row Pulmonary Rehab from 07/22/2023 in [facility]  Date 07/22/23  Educator MB  Instruction Review Code 1- Verbalizes Understanding    Falls Prevention: - Provides verbal and written material to individual with discussion of falls prevention and safety. Flowsheet Row Pulmonary Rehab from 07/22/2023 in [facility] Pulmonary [facility]  Date 07/22/23  Educator MB  Instruction Review Code 1- Verbalizes Understanding    Chronic Lung Disease Review: - Group verbal instruction with posters, models, PowerPoint presentations and videos,  to review new updates, new respiratory medications, new advancements in procedures and treatments. Providing information on websites and 800 numbers for continued self-education. Includes information about supplement oxygen, available portable oxygen systems, continuous and intermittent flow rates, oxygen safety, concentrators, and Medicare reimbursement for  oxygen. Explanation of Pulmonary Drugs, including class, frequency, complications, importance of spacers, rinsing mouth after steroid MDI's, and proper cleaning methods for nebulizers. Review of basic lung anatomy and physiology related to function, structure, and complications of lung disease. Review of risk factors. Discussion about methods for diagnosing sleep apnea and types of masks and machines for OSA. Includes a review of the use of types of environmental controls: home humidity, furnaces, filters, dust mite/pet prevention, HEPA vacuums. Discussion about weather changes, air quality and the benefits of nasal washing. Instruction on Warning signs, infection symptoms, calling MD promptly, preventive modes, and value of vaccinations. Review of effective airway clearance, coughing and/or vibration techniques. Emphasizing that all should Create an Action Plan. Written material given at graduation.   AED/CPR: - Group verbal and written instruction with the use of models to demonstrate the basic use of the AED with the basic ABC's of resuscitation.    Anatomy and Cardiac Procedures: - Group verbal and visual presentation and models provide information about basic cardiac anatomy and function. Reviews the testing methods done to diagnose heart disease and the outcomes of the test results. Describes the treatment choices: Medical Management, Angioplasty, or Coronary Bypass Surgery for treating various heart conditions including Myocardial Infarction, Angina, Valve Disease, and Cardiac Arrhythmias.  Written material given at graduation.   Medication Safety: - Group verbal and visual instruction to review commonly prescribed medications for heart and lung disease. Reviews the medication, class of the drug, and side effects. Includes the steps to properly store meds and maintain the prescription regimen.  Written material given at graduation.   Other: -Provides group and verbal instruction  on various  topics (see comments)   Knowledge Questionnaire Score:    Core Components/Risk Factors/Patient Goals at Admission:  Personal Goals and Risk Factors at Admission - 07/22/23 1552       Core Components/Risk Factors/Patient Goals on Admission    Weight Management Yes;Weight Loss    Intervention Weight Management: Develop a combined nutrition and exercise program designed to reach desired caloric intake, while maintaining appropriate intake of nutrient and fiber, sodium and fats, and appropriate energy expenditure required for the weight goal.;Weight Management: Provide education and appropriate resources to help participant work on and attain dietary goals.;Weight Management/Obesity: Establish reasonable short term and long term weight goals.    Admit Weight 180 lb 4.8 oz (81.8 kg)    Goal Weight: Short Term 170 lb (77.1 kg)    Goal Weight: Long Term 160 lb (72.6 kg)    Expected Outcomes Short Term: Continue to assess and modify interventions until short term weight is achieved;Weight Loss: Understanding of general recommendations for a balanced deficit meal plan, which promotes 1-2 lb weight loss per week and includes a negative energy balance of [phone] kcal/d;Understanding recommendations for meals to include 15-35% energy as protein, 25-35% energy from fat, 35-60% energy from carbohydrates, less than 200mg  of dietary cholesterol, 20-35 gm of total fiber daily;Understanding of distribution of calorie intake throughout the day with the consumption of 4-5 meals/snacks;Long Term: Adherence to nutrition and physical activity/exercise program aimed toward attainment of established weight goal    Improve shortness of breath with ADL's Yes    Intervention Provide education, individualized exercise plan and daily activity instruction to help decrease symptoms of SOB with activities of daily living.    Expected Outcomes Short Term: Improve cardiorespiratory fitness to achieve a reduction of symptoms when  performing ADLs;Long Term: Be able to perform more ADLs without symptoms or delay the onset of symptoms    Hypertension Yes    Intervention Provide education on lifestyle modifcations including regular physical activity/exercise, weight management, moderate sodium restriction and increased consumption of fresh fruit, vegetables, and low fat dairy, alcohol moderation, and smoking cessation.;Monitor prescription use compliance.    Expected Outcomes Short Term: Continued assessment and intervention until BP is < 140/75mm HG in hypertensive participants. < 130/3mm HG in hypertensive participants with diabetes, heart failure or chronic kidney disease.;Long Term: Maintenance of blood pressure at goal levels.          Education:Diabetes - Individual verbal and written instruction to review signs/symptoms of diabetes, desired ranges of glucose level fasting, after meals and with exercise. Acknowledge that pre and post exercise glucose checks will be done for 3 sessions at entry of program.   Know Your Numbers and Heart Failure: - Group verbal and visual instruction to discuss disease risk factors for cardiac and pulmonary disease and treatment options.  Reviews associated critical values for Overweight/Obesity, Hypertension, Cholesterol, and Diabetes.  Discusses basics of heart failure: signs/symptoms and treatments.  Introduces Heart Failure Zone chart for action plan for heart failure.  Written material given at graduation.   Core Components/Risk Factors/Patient Goals Review:    Core Components/Risk Factors/Patient Goals at Discharge (Final Review):    ITP Comments:  ITP Comments     Row Name 07/20/23 1338 07/22/23 1541 07/28/23 1117 08/12/23 1024 09/09/23 1224   ITP Comments Virtual Visit completed. Patient informed on EP and RD appointment and 6 Minute walk test. Patient also informed of patient health questionnaires on My Chart. Patient Verbalizes understanding. Visit diagnosis can be found  in [facility] 06/05/2023. Completed and gym orientation for respiratory care services. Initial ITP created and sent for review to Dr. Faud Aleskerov, Medical Director. First full day of exercise!  Patient was oriented to gym and equipment including functions, settings, policies, and procedures.  Patient's individual exercise prescription and treatment plan were reviewed.  All starting workloads were established based on the results of the 6 minute walk test done at initial orientation visit.  The plan for exercise progression was also introduced and progression will be customized based on patient's performance and goals. 30 Day review completed. Medical Director ITP review done, changes made as directed, and signed approval by Medical Director.    new to program 30 Day review completed. Medical Director ITP review done, changes made as directed, and signed approval by Medical Director.    Row Name 09/28/23 1408           ITP Comments Patient contacted staff to state she would like to be discharged at this time. She completed 8 of 36 sessions          Comments: Early Discharge ITP

## 2023-09-29 ENCOUNTER — Telehealth: Payer: Self-pay | Admitting: Nurse Practitioner

## 2023-09-29 ENCOUNTER — Encounter

## 2023-09-29 NOTE — Telephone Encounter (Signed)
 Copied from CRM #[id number]. Topic: General - Other >> Sep 29, 2023  1:36 PM Delon DASEN wrote: Reason for CRM: Patient called about the application for CAP, they state it was not received- the last one was not complete-  patient [phone]

## 2023-09-29 NOTE — Telephone Encounter (Signed)
 Called patient to inform her that the CAP paperwork was faxed on 7/8, she states that they never received it so I told her I will fax it again today and informed her to call them to check status tomorrow.

## 2023-09-30 ENCOUNTER — Other Ambulatory Visit: Payer: Self-pay

## 2023-09-30 NOTE — Progress Notes (Signed)
 Specialty Pharmacy Refill Coordination Note  Tonya Odonnell is a 58 y.o. female contacted today regarding refills of specialty medication(s) Ambrisentan  (LETAIRIS )   Patient requested Delivery   Delivery date: 10/02/23   Verified address: [address]  [place] [address]   Medication will be filled on 10/01/23.

## 2023-09-30 NOTE — Telephone Encounter (Signed)
 Patient states the CAP application still has not been received. Patient was provided an email to send application to. Requesting application to be emailed to: [email or web address]

## 2023-10-01 ENCOUNTER — Encounter

## 2023-10-01 ENCOUNTER — Other Ambulatory Visit (HOSPITAL_COMMUNITY): Payer: Self-pay | Admitting: Pharmacist

## 2023-10-01 ENCOUNTER — Other Ambulatory Visit: Payer: Self-pay

## 2023-10-01 ENCOUNTER — Telehealth (HOSPITAL_COMMUNITY): Payer: Self-pay

## 2023-10-01 MED ORDER — WINREVAIR 45 MG ~~LOC~~ KIT: PACK | SUBCUTANEOUS | Status: DC

## 2023-10-01 NOTE — Telephone Encounter (Signed)
 Called and spoke with [facility]. Baseline CBC was obtained 09/24/23 so all needed blood work has been completed. They will go ahead and reach out to patient and schedule shipment. Patient will likely not receive injection until next week since a specialty pharmacy nurse will need to provide education on first dose injection. She will also need repeat CBCs before each subsequent injection for the first 5 doses. Per Dr. Mclean, since baseline hemoglobin is already ~15, will tolerate an increase of hemoglobin up to 16.5 before making any adjustments. Winrevair  is known to increase hemoglobin levels and decrease PLT.

## 2023-10-01 NOTE — Telephone Encounter (Signed)
 [facility] pharmacy called stating that they are waiting on directions for when to ship out the winrevair  to patient? Reports that they need to know if they can ship without blood work, or what the plan is.   Advised that our pharmacy team handles this medication, will forward message.   They will await direction from pharmacy---call back number is [phone] opt 2, then opt 1

## 2023-10-01 NOTE — Telephone Encounter (Signed)
 Printed paperwork from the patients chart. Corrected the paperwork and answered the missing questions. Paperwork has been re-faxed and emailed along with the medication and DX list for the patient.   Called and notified patient of the above. Patient thanked me for calling back and stated that we can call them as well. I told the patient I would contact [facility] when I return tomorrow and confirm the paperwork was received and that they have everything they need.

## 2023-10-01 NOTE — Telephone Encounter (Signed)
 Patient is calling to report that fax received on 09/29/2023. For the CAP program is still not completed.  And there are still 2 questions missing. Patient reporting that she will already received a denial letter because application was not completed correctly. Concerned The enrollment period is the end of July what to speak to an Print production planner. [phone]  The questions that are missing are: Page 2 - His primary psychical diagnosis?' If yes need the codes of diagnosis Page 3-In your Clinical Judgement are the the health conditions Chronic?

## 2023-10-02 NOTE — Telephone Encounter (Signed)
 Called [facility]. Paperwork has not been processed through their system that was sent in yesterday. Representative states that it can take 24 to 48 business hours to process into their system. Will call on Monday to confirm that paperwork was received.

## 2023-10-03 DIAGNOSIS — Z7689 Persons encountering health services in other specified circumstances: Secondary | ICD-10-CM | POA: Diagnosis not present

## 2023-10-06 ENCOUNTER — Encounter

## 2023-10-06 NOTE — Telephone Encounter (Signed)
 I am not sure what blood work would need to be done, I will add [name] to the message. I think its a CBC but I do not want to misquote. Also, she will have to call the pharmacy back. I am not sure what else has to be done unless they have already scheduled the shipment and nursing visit.  Thanks

## 2023-10-06 NOTE — Telephone Encounter (Signed)
 Called [facility]. They said she just needs to schedule her shipment date. They apologized for the call hanging up. They are going to reach out manually again, but she can also call [phone] to schedule the shipment. Once shipment is scheduled, specialty pharmacy nursing staff will be notified to schedule the first visit for patient education/injection technique. This will be a separate call. She does not need additional labs before starting winrevair  at this time, as I provided [facility] with this information last week (see note 10/01/23). They are correct in that she will need additional CBC's before each of her 1st five injections. We cannot schedule the CBC until she gets the Winrevair  from [facility] and does the first injection. She will need to call the office once she does the first injection so a CBC can be scheduled. Per Dr. Mclean, since baseline hemoglobin is already ~15, will tolerate an increase of hemoglobin up to 16.5 before considering any adjustments. Winrevair  is known to increase hemoglobin levels and decrease PLT.

## 2023-10-06 NOTE — Telephone Encounter (Signed)
 Advanced Heart Failure Patient Advocate Encounter  Contacted [facility] regarding Winrevair  update. They left the patient a message yesterday to return the call and have another call scheduled for today. Will follow up with mychart message reminding her to call [facility] back.

## 2023-10-07 NOTE — Telephone Encounter (Signed)
 Advanced Heart Failure Patient Advocate Encounter  Spoke with patient. [facility] called her back and she will receive the shipment Thursday. She is still awaiting a call regarding nursing visit. She is aware to call the office to schedule CBC after receiving the medication.

## 2023-10-08 ENCOUNTER — Encounter

## 2023-10-09 ENCOUNTER — Telehealth (HOSPITAL_COMMUNITY): Payer: Self-pay

## 2023-10-09 NOTE — Telephone Encounter (Signed)
 Received a fax requesting medical records from [facility]. Records were successfully faxed to: [phone] ,which was the number provided.. Medical request form will be scanned into patients chart.

## 2023-10-13 ENCOUNTER — Encounter

## 2023-10-15 ENCOUNTER — Encounter

## 2023-10-15 NOTE — Telephone Encounter (Signed)
 Advanced Heart Failure Patient Advocate Encounter  Per [facility], first injection was administered 08/01.  [name] Pa, CPhT

## 2023-10-16 ENCOUNTER — Telehealth (HOSPITAL_COMMUNITY): Payer: Self-pay

## 2023-10-16 NOTE — Telephone Encounter (Signed)
 Received a fax requesting medical records from [facility]. Records were successfully faxed to: [phone] ,which was the number provided.. Medical request form will be scanned into patients chart.   Phone number: [phone]

## 2023-10-18 DIAGNOSIS — Z7689 Persons encountering health services in other specified circumstances: Secondary | ICD-10-CM | POA: Diagnosis not present

## 2023-10-19 NOTE — Addendum Note (Signed)
 Addended by: JERONA DALTON HERO on: 10/19/2023 08:58 AM   Modules accepted: Orders

## 2023-10-20 ENCOUNTER — Encounter

## 2023-10-20 ENCOUNTER — Other Ambulatory Visit: Payer: Self-pay

## 2023-10-20 DIAGNOSIS — Z419 Encounter for procedure for purposes other than remedying health state, unspecified: Secondary | ICD-10-CM | POA: Diagnosis not present

## 2023-10-21 ENCOUNTER — Other Ambulatory Visit: Payer: Self-pay

## 2023-10-21 NOTE — Progress Notes (Signed)
 Specialty Pharmacy Refill Coordination Note  Tonya Odonnell is a 58 y.o. female contacted today regarding refills of specialty medication(s) Ambrisentan  (LETAIRIS )   Patient requested Delivery   Delivery date: 10/23/23   Verified address: [address]  [place] [address]   Medication will be filled on 10/22/23.

## 2023-10-22 ENCOUNTER — Other Ambulatory Visit: Payer: Self-pay

## 2023-10-22 ENCOUNTER — Encounter

## 2023-10-22 ENCOUNTER — Ambulatory Visit (HOSPITAL_COMMUNITY)
Admission: RE | Admit: 2023-10-22 | Discharge: 2023-10-22 | Disposition: A | Discharge status: Home / Self Care | Source: Ambulatory Visit | Attending: Cardiology | Admitting: Cardiology

## 2023-10-22 ENCOUNTER — Other Ambulatory Visit (HOSPITAL_COMMUNITY): Payer: Self-pay

## 2023-10-22 DIAGNOSIS — Z7689 Persons encountering health services in other specified circumstances: Secondary | ICD-10-CM | POA: Diagnosis not present

## 2023-10-22 DIAGNOSIS — I272 Pulmonary hypertension, unspecified: Secondary | ICD-10-CM | POA: Insufficient documentation

## 2023-10-22 LAB — CBC
HCT: 47.6 % — ABNORMAL HIGH (ref 36.0–46.0)
Hemoglobin: 15.7 g/dL — ABNORMAL HIGH (ref 12.0–15.0)
MCH: 28 pg (ref 26.0–34.0)
MCHC: 33 g/dL (ref 30.0–36.0)
MCV: 84.8 fL (ref 80.0–100.0)
Platelets: 344 K/uL (ref 150–400)
RBC: 5.61 MIL/uL — ABNORMAL HIGH (ref 3.87–5.11)
RDW: 14.3 % (ref 11.5–15.5)
WBC: 6.5 K/uL (ref 4.0–10.5)
nRBC: 0 % (ref 0.0–0.2)

## 2023-10-23 ENCOUNTER — Encounter: Payer: Self-pay | Admitting: Cardiology

## 2023-10-23 ENCOUNTER — Ambulatory Visit (HOSPITAL_COMMUNITY): Payer: Self-pay | Admitting: Cardiology

## 2023-10-23 DIAGNOSIS — Z7689 Persons encountering health services in other specified circumstances: Secondary | ICD-10-CM | POA: Diagnosis not present

## 2023-10-23 DIAGNOSIS — I5022 Chronic systolic (congestive) heart failure: Secondary | ICD-10-CM

## 2023-10-26 ENCOUNTER — Other Ambulatory Visit (HOSPITAL_COMMUNITY): Payer: Self-pay | Admitting: Pharmacist

## 2023-10-26 DIAGNOSIS — I272 Pulmonary hypertension, unspecified: Secondary | ICD-10-CM

## 2023-10-26 NOTE — Telephone Encounter (Signed)
 Spoke with patient over the phone. HgB is 15.7, which is up from last draw of 15.2 but within her baseline. Per my note from 10/01/23, we expect Winrevair  to increase HgB levels, so Dr. Rolan noted we would allow a HgB increase up to 16.5 before adjusting any of her Winrevair  dosing. She will need repeat CBC's before each of the first 5 injections. Scheduled for 11/16/23. Patient expressed understanding.

## 2023-10-27 ENCOUNTER — Encounter

## 2023-10-29 ENCOUNTER — Encounter

## 2023-11-01 DIAGNOSIS — Z7689 Persons encountering health services in other specified circumstances: Secondary | ICD-10-CM | POA: Diagnosis not present

## 2023-11-03 ENCOUNTER — Encounter

## 2023-11-05 ENCOUNTER — Encounter

## 2023-11-07 DIAGNOSIS — Z7689 Persons encountering health services in other specified circumstances: Secondary | ICD-10-CM | POA: Diagnosis not present

## 2023-11-10 ENCOUNTER — Encounter

## 2023-11-11 ENCOUNTER — Other Ambulatory Visit

## 2023-11-11 DIAGNOSIS — E063 Autoimmune thyroiditis: Secondary | ICD-10-CM | POA: Diagnosis not present

## 2023-11-11 DIAGNOSIS — Z7689 Persons encountering health services in other specified circumstances: Secondary | ICD-10-CM | POA: Diagnosis not present

## 2023-11-12 ENCOUNTER — Encounter

## 2023-11-12 LAB — T4, FREE: Free T4: 1.8 ng/dL (ref 0.8–1.8)

## 2023-11-12 LAB — TSH: TSH: 4.22 m[IU]/L (ref 0.40–4.50)

## 2023-11-13 ENCOUNTER — Other Ambulatory Visit: Payer: Self-pay

## 2023-11-15 DIAGNOSIS — Z7689 Persons encountering health services in other specified circumstances: Secondary | ICD-10-CM | POA: Diagnosis not present

## 2023-11-16 ENCOUNTER — Telehealth (HOSPITAL_COMMUNITY): Payer: Self-pay

## 2023-11-16 ENCOUNTER — Ambulatory Visit: Admitting: "Endocrinology

## 2023-11-16 ENCOUNTER — Encounter: Payer: Self-pay | Admitting: "Endocrinology

## 2023-11-16 ENCOUNTER — Ambulatory Visit (HOSPITAL_COMMUNITY): Payer: Self-pay | Admitting: Cardiology

## 2023-11-16 ENCOUNTER — Ambulatory Visit (HOSPITAL_COMMUNITY)
Admission: RE | Admit: 2023-11-16 | Discharge: 2023-11-16 | Disposition: A | Discharge status: Home / Self Care | Source: Ambulatory Visit | Attending: Cardiology | Admitting: Cardiology

## 2023-11-16 VITALS — BP 122/80 | HR 115 | Ht 63.0 in | Wt 176.0 lb

## 2023-11-16 DIAGNOSIS — E063 Autoimmune thyroiditis: Secondary | ICD-10-CM

## 2023-11-16 DIAGNOSIS — I272 Pulmonary hypertension, unspecified: Secondary | ICD-10-CM | POA: Insufficient documentation

## 2023-11-16 DIAGNOSIS — I2721 Secondary pulmonary arterial hypertension: Secondary | ICD-10-CM

## 2023-11-16 LAB — CBC
HCT: 53.4 % — ABNORMAL HIGH (ref 36.0–46.0)
Hemoglobin: 17.5 g/dL — ABNORMAL HIGH (ref 12.0–15.0)
MCH: 28.1 pg (ref 26.0–34.0)
MCHC: 32.8 g/dL (ref 30.0–36.0)
MCV: 85.9 fL (ref 80.0–100.0)
Platelets: 337 K/uL (ref 150–400)
RBC: 6.22 MIL/uL — ABNORMAL HIGH (ref 3.87–5.11)
RDW: 14.6 % (ref 11.5–15.5)
WBC: 6.4 K/uL (ref 4.0–10.5)
nRBC: 0 % (ref 0.0–0.2)

## 2023-11-16 LAB — BASIC METABOLIC PANEL WITH GFR
Anion gap: 12 (ref 5–15)
BUN: 13 mg/dL (ref 6–20)
CO2: 23 mmol/L (ref 22–32)
Calcium: 9.2 mg/dL (ref 8.9–10.3)
Chloride: 106 mmol/L (ref 98–111)
Creatinine, Ser: 0.82 mg/dL (ref 0.44–1.00)
GFR, Estimated: >60 mL/min (ref >60)
Glucose, Bld: 151 mg/dL — ABNORMAL HIGH (ref 70–99)
Potassium: 3.7 mmol/L (ref 3.5–5.1)
Sodium: 141 mmol/L (ref 135–145)

## 2023-11-16 MED ORDER — LEVOTHYROXINE SODIUM 112 MCG PO TABS: ORAL_TABLET | ORAL | 3 refills | Status: AC

## 2023-11-16 NOTE — Telephone Encounter (Signed)
 Contacted Tonya Odonnell over the phone regarding her CBC results from today, 11/16/23. Discussed her Hgb increase from 15.7 mg/dL to 82.4 mg/dL over the past three weeks. Patient took her second dose of Winrevair  on 10/30/23, which had been increased to 0.7 mg/kg (1.1 mL). Prior to initiation of Winrevair , decision made with Dr. Rolan to continue Winrevair  if Hgb remain below 16.5 mg/dL due to patient's history of having elevated Hgb levels. Discussed with Dr. Rolan today. Instructed patient to skip upcoming dose of Winrevair  (scheduled for 11/20/23). Patient will get repeat CBC checked on 12/07/23 to determine appropriateness of continuing Winrevair . If Hgb drops below 16.5 mg/dL, consider re-initiating lower-dose Winrevair  0.3 mg/kg (0.5 mL). Will update medication list at that time and provide updated instructions to specialty pharmacy.  Patient also reports increased facial swelling (face is puffy) and heavier breathing that began on 11/08/23. Patient does not think this is an allergic reaction, no lip, tongue or throat swelling. Patient reports increasing her furosemide  to 40 mg daily for the past 3 days and that did improve symptoms some. No swelling in her legs reported. Patient did not discuss this with her endocrinologist at today's visit. Instructed patient to seek medical care if her breathing becomes markedly worse. Patient has a follow-up appointment with Dr. Rolan on 11/24/23.   Tonya Odonnell, PharmD PGY2 Cardiology Pharmacy Resident 11/16/2023 2:13 PM

## 2023-11-16 NOTE — Progress Notes (Signed)
 Outpatient Endocrinology Note Tonya Birmingham, MD  11/16/23   Tonya Odonnell [id number]  Referring Provider: Cannady, Jolene T, NP Primary Care Provider: Cannady, Jolene T, NP Subjective  No chief complaint on file.   Assessment & Plan  Diagnoses and all orders for this visit:  Hashimoto's thyroiditis -     TSH + free T4  Other orders -     levothyroxine  (SYNTHROID ) 112 MCG tablet; Take 1 pill everyday except Sunday take half a pill     Tonya Odonnell is currently taking levothyroxine  112 mcg every day except Sundays take half a pill. Patient's FT4 has normalized for the first time. Mildly elevated TSH appropriate for age. Educated on thyroid  axis.  Recommend the following: continue current dose: Take levothyroxine  112 mcg every day except Sundays take half a pill. Advised to take levothyroxine  first thing in the morning on empty stomach and wait at least 30 minutes to 1 hour before eating or drinking anything or taking any other medications. Space out levothyroxine  by 4 hours from any acid reflux medication/fibrate/iron/calcium /multivitamin. Advised to take nutritional supplements in the evening. Repeat lab before next visit or sooner if symptoms of hyperthyroidism or hypothyroidism develop.  Notify us  immediately in case of significant weight gain or loss. Counseled on compliance and follow up needs.  I have reviewed current medications, nurse's notes, allergies, vital signs, past medical and surgical history, family medical history, and social history for this encounter. Counseled patient on symptoms, examination findings, lab findings, imaging results, treatment decisions and monitoring and prognosis. The patient understood the recommendations and agrees with the treatment plan. All questions regarding treatment plan were fully answered.   Return in about 6 months (around 05/15/2024) for visit + labs before next visit.   Tonya Birmingham, MD  11/16/23   I have  reviewed current medications, nurse's notes, allergies, vital signs, past medical and surgical history, family medical history, and social history for this encounter. Counseled patient on symptoms, examination findings, lab findings, imaging results, treatment decisions and monitoring and prognosis. The patient understood the recommendations and agrees with the treatment plan. All questions regarding treatment plan were fully answered.   History of Present Illness Tonya Odonnell is a 58 y.o. year old female who presents to our clinic with hypothyroidism diagnosed in 30s.  On levothyroxine  112 mcg every day except Sundays take half a pill.  Recently has ED visit for dizziness weakness unable to speak to triage s/p MRI and CT. Plans to see neurology.   Symptoms suggestive of HYPOTHYROIDISM:  fatigue Yes weight gain No cold intolerance  No constipation  No  Symptoms suggestive of HYPERTHYROIDISM:  weight loss  No heat intolerance Yes hyperdefecation  No palpitations  No  Compressive symptoms:  dysphagia  No dysphonia  No positional dyspnea (especially with simultaneous arms elevation)  No  Smokes  No On biotin  No Personal history of head/neck surgery/irradiation  No  Physical Exam  BP 122/80   Pulse (!) 115   Ht 5' 3 (1.6 m)   Wt 176 lb (79.8 kg)   LMP  (LMP Unknown)   SpO2 96%   BMI 31.18 kg/m  Constitutional: well developed, well nourished Head: normocephalic, atraumatic, no exophthalmos Eyes: sclera anicteric, no redness Neck: no thyromegaly, no thyroid  tenderness; no nodules palpated Lungs: normal respiratory effort Neurology: alert and oriented, no fine hand tremor Skin: dry, no appreciable rashes Musculoskeletal: no appreciable defects Psychiatric: normal mood and affect  Allergies Allergies  Allergen Reactions  Lisinopril Other (See Comments)    cough     Current Medications Patient's Medications  New Prescriptions   No medications on file  Previous  Medications   AMBRISENTAN  (LETAIRIS ) 10 MG TABLET    Take 1 tablet (10 mg total) by mouth daily.   AZELAIC ACID  15 % GEL    Apply to face every morning   BISMUTH SUBSALICYLATE (PEPTO BISMOL) 262 MG/15ML SUSPENSION    Take 30 mLs by mouth every 6 (six) hours as needed for diarrhea or loose stools.   CLOBETASOL  CREAM (TEMOVATE ) 0.05 %    Apply 1 Application topically as needed.   CROMOLYN  (OPTICROM ) 4 % OPHTHALMIC SOLUTION    Place 1 drop into both eyes 4 (four) times daily.   ELIDEL  1 % CREAM    Apply to red scaly areas on the face BID for seb derm   FLUTICASONE  (FLONASE ) 50 MCG/ACT NASAL SPRAY    Place 2 sprays into both nostrils daily as needed for allergies or rhinitis.   FUROSEMIDE  (LASIX ) 40 MG TABLET    Take 1 tablet (40 mg total) by mouth daily.   HYDRALAZINE  (APRESOLINE ) 25 MG TABLET    Take 1 tablet (25 mg total) by mouth 2 (two) times daily.   IVERMECTIN  (SOOLANTRA ) 1 % CREA    Apply 1 Application topically at bedtime.   MECLIZINE  (ANTIVERT ) 25 MG TABLET    Take 1 tablet (25 mg total) by mouth 3 (three) times daily as needed for dizziness.   OMEPRAZOLE  (PRILOSEC) 40 MG CAPSULE    Take 40 mg by mouth daily as needed (Heartburn).   ONDANSETRON  (ZOFRAN ) 4 MG TABLET    Take 1 tablet (4 mg total) by mouth every 8 (eight) hours as needed for nausea or vomiting.   POTASSIUM CHLORIDE  SA (KLOR-CON  M) 20 MEQ TABLET    Take 1 tablet (20 mEq total) by mouth daily.   SELEXIPAG  800 MCG TABS    Take 1 tablet (800 mcg total) by mouth 2 (two) times daily.   SOTATERCEPT (WINREVAIR ) SUBCUTANEOUS INJECTION    Inject 0.5 mL (0.3 mg/kg) subcutaneously for the first dose, then inject 1.1 mL (0.7 mg/kg) subcutaneously every 3 weeks. Dosing weight 80.9 kg.   TADALAFIL , PAH, (ADCIRCA ) 20 MG TABLET    Take 2 tablets (40 mg total) by mouth daily. NDC's covered by plan: 66657972190, [id number], [id number]   VALACYCLOVIR  (VALTREX ) 1000 MG TABLET    Take 1,000 mg by mouth daily as needed (Cold Sores).  Modified  Medications   Modified Medication Previous Medication   LEVOTHYROXINE  (SYNTHROID ) 112 MCG TABLET levothyroxine  (SYNTHROID ) 112 MCG tablet      Take 1 pill everyday except Sunday take half a pill    Take 1 pill everyday except Sunday take half a pill  Discontinued Medications   No medications on file    Past Medical History Past Medical History:  Diagnosis Date   Anemia    USE TO HAVE IT,NOT NOW,2015,2016   Arthritis    HIPS,KNEES,HANDS   Breast cancer in female (HCC)    Cancer (HCC)    LEFT   Hyperlipidemia    Hypertension    Low iron    Neuromuscular disorder (HCC)    MUSCLE PAIN,FIBROMYALGIA   Osteopenia    Pulmonary HTN (HCC)    PVC (premature ventricular contraction)    Thyroid  disease     Past Surgical History Past Surgical History:  Procedure Laterality Date   LAPAROSCOPIC APPENDECTOMY N/A 03/27/2023   Procedure: APPENDECTOMY LAPAROSCOPIC;  Surgeon: Lyndel Deward PARAS, MD;  Location: [facility] OR;  Service: General;  Laterality: N/A;   Left Ovarian removal Left    MASTECTOMY Left    LYMPHNODES REMOVED   RIGHT HEART CATH N/A 07/02/2023   Procedure: RIGHT HEART CATH;  Surgeon: Rolan Ezra RAMAN, MD;  Location: [facility] INVASIVE CV LAB;  Service: Cardiovascular;  Laterality: N/A;   RIGHT/LEFT HEART CATH AND CORONARY ANGIOGRAPHY N/A 12/12/2022   Procedure: RIGHT/LEFT HEART CATH AND CORONARY ANGIOGRAPHY;  Surgeon: Darron Deatrice LABOR, MD;  Location: [facility] INVASIVE CV LAB;  Service: Cardiovascular;  Laterality: N/A;    Family History family history includes Hypertension in her mother; Leukemia in her mother; Stomach cancer in her father.  Social History Social History   Socioeconomic History   Marital status: Married    Spouse name: Not on file   Number of children: Not on file   Years of education: Not on file   Highest education level: Not on file  Occupational History   Occupation: disabled  Tobacco Use   Smoking status: Never   Smokeless tobacco: Never  Vaping Use    Vaping status: Never Used  Substance and Sexual Activity   Alcohol use: Never   Drug use: Never   Sexual activity: Not Currently  Other Topics Concern   Not on file  Social History Narrative   Not on file   Social Drivers of Health   Financial Resource Strain: High Risk (05/13/2021)   Received from [facility]   Overall Financial Resource Strain (CARDIA)    Difficulty of Paying Living Expenses: Very hard  Food Insecurity: No Food Insecurity (03/27/2023)   Hunger Vital Sign    Worried About Running Out of Food in the Last Year: Never true    Ran Out of Food in the Last Year: Never true  Transportation Needs: No Transportation Needs (03/27/2023)   PRAPARE - Administrator, Civil Service (Medical): No    Lack of Transportation (Non-Medical): No  Physical Activity: Inactive (05/13/2021)   Received from [facility]   Exercise Vital Sign    On average, how many days per week do you engage in moderate to strenuous exercise (like a brisk walk)?: 0 days    On average, how many minutes do you engage in exercise at this level?: 0 min  Stress: Stress Concern Present (05/13/2021)   Received from [facility] Occup[facility] - Occupational Stress Questionnaire    Feeling of Stress : Very much  Social Connections: Unknown (07/13/2021)   Received from [facility]   Social Network    Social Network: Not on file  Intimate Partner Violence: Not At Risk (03/27/2023)   Humiliation, Afraid, Rape, and Kick questionnaire    Fear of Current or Ex-Partner: No    Emotionally Abused: No    Physically Abused: No    Sexually Abused: No    Laboratory Investigations Lab Results  Component Value Date   TSH 4.22 11/11/2023   TSH 5.48 (H) 08/31/2023   TSH 2.99 06/01/2023   FREET4 1.8 11/11/2023   FREET4 1.8 08/31/2023   FREET4 1.9 (H) 06/01/2023     No results found for: TSI   No components found for: TRAB   Lab Results  Component Value Date   CHOL 276  (H) 08/19/2023   Lab Results  Component Value Date   HDL 73 08/19/2023   Lab Results  Component Value Date   LDLCALC 185 (H) 08/19/2023   Lab  Results  Component Value Date   TRIG 107 08/19/2023   Lab Results  Component Value Date   CHOLHDL 3.5 12/11/2022   Lab Results  Component Value Date   CREATININE 0.81 09/24/2023   No results found for: GFR    Component Value Date/Time   NA 141 09/24/2023 1000   NA 142 08/19/2023 1200   K 3.5 09/24/2023 1000   CL 107 09/24/2023 1000   CO2 23 09/24/2023 1000   GLUCOSE 115 (H) 09/24/2023 1000   BUN 16 09/24/2023 1000   BUN 18 08/19/2023 1200   CREATININE 0.81 09/24/2023 1000   CREATININE 0.82 08/17/2023 1027   CALCIUM  9.6 09/24/2023 1000   PROT 7.5 08/28/2023 1849   PROT 6.9 08/19/2023 1200   ALBUMIN  4.2 08/28/2023 1849   ALBUMIN  4.5 08/19/2023 1200   AST 34 08/28/2023 1849   AST 28 08/17/2023 1027   ALT 18 08/28/2023 1849   ALT 19 08/17/2023 1027   ALKPHOS 73 08/28/2023 1849   BILITOT 0.8 08/28/2023 1849   BILITOT 0.8 08/19/2023 1200   BILITOT 1.2 08/17/2023 1027   GFRNONAA >60 09/24/2023 1000   GFRNONAA >60 08/17/2023 1027      Latest Ref Rng & Units 09/24/2023   10:00 AM 08/28/2023    6:55 PM 08/28/2023    6:49 PM  BMP  Glucose 70 - 99 mg/dL 884  99  97   BUN 6 - 20 mg/dL 16  27  21    Creatinine 0.44 - 1.00 mg/dL 9.18  9.19  9.18   Sodium 135 - 145 mmol/L 141  139  137   Potassium 3.5 - 5.1 mmol/L 3.5  4.1  4.5   Chloride 98 - 111 mmol/L 107  106  106   CO2 22 - 32 mmol/L 23   19   Calcium  8.9 - 10.3 mg/dL 9.6   9.4        Component Value Date/Time   WBC 6.5 10/22/2023 1413   RBC 5.61 (H) 10/22/2023 1413   HGB 15.7 (H) 10/22/2023 1413   HGB 14.2 08/17/2023 1027   HGB 17.7 (H) 12/22/2022 1212   HCT 47.6 (H) 10/22/2023 1413   HCT 53.6 (H) 12/22/2022 1212   PLT 344 10/22/2023 1413   PLT 310 08/17/2023 1027   PLT 379 12/22/2022 1212   MCV 84.8 10/22/2023 1413   MCV 89 12/22/2022 1212   MCH 28.0  10/22/2023 1413   MCHC 33.0 10/22/2023 1413   RDW 14.3 10/22/2023 1413   RDW 12.9 12/22/2022 1212   LYMPHSABS 3.2 08/28/2023 1849   LYMPHSABS 2.2 12/22/2022 1212   MONOABS 0.6 08/28/2023 1849   EOSABS 0.1 08/28/2023 1849   EOSABS 0.1 12/22/2022 1212   BASOSABS 0.1 08/28/2023 1849   BASOSABS 0.1 12/22/2022 1212      Parts of this note may have been dictated using voice recognition software. There may be variances in spelling and vocabulary which are unintentional. Not all errors are proofread. Please notify the dino if any discrepancies are noted or if the meaning of any statement is not clear.

## 2023-11-17 ENCOUNTER — Encounter

## 2023-11-19 ENCOUNTER — Encounter

## 2023-11-20 ENCOUNTER — Ambulatory Visit: Admitting: Neurology

## 2023-11-20 ENCOUNTER — Encounter: Payer: Self-pay | Admitting: Neurology

## 2023-11-20 VITALS — BP 124/82 | HR 101 | Ht 63.0 in | Wt 177.0 lb

## 2023-11-20 DIAGNOSIS — M546 Pain in thoracic spine: Secondary | ICD-10-CM

## 2023-11-20 DIAGNOSIS — Z7689 Persons encountering health services in other specified circumstances: Secondary | ICD-10-CM | POA: Diagnosis not present

## 2023-11-20 DIAGNOSIS — R42 Dizziness and giddiness: Secondary | ICD-10-CM

## 2023-11-20 DIAGNOSIS — Z419 Encounter for procedure for purposes other than remedying health state, unspecified: Secondary | ICD-10-CM | POA: Diagnosis not present

## 2023-11-20 NOTE — Progress Notes (Unsigned)
 NEUROLOGY CONSULTATION NOTE  Tonya Odonnell MRN: [id number] DOB: [birth year]  Referring provider: Dr. Ozell Arts (ER) Primary care provider: Melanie Polio, NP  Reason for consult:  dizziness  Dear Dr Arts:  Thank you for your kind referral of Tonya Odonnell for consultation of the above symptoms. Although her history is well known to you, please allow me to reiterate it for the purpose of our medical record. She is alone in the office today. Records and images were personally reviewed where available.   HISTORY OF PRESENT ILLNESS: This is a 58 year old right-handed woman with a history of anemia, osteopenia, hypertension, pulmonary hypertension, hypothyroidism, fibromyalgia, presenting for evaluation of dizziness. She reports going to physical therapy for pulmonary hypertension on 6/19, she was put on the treadmill and started to feel dizzy. She was walking around the room and told her therapist she was getting dizzy. The next day, she got up from the couch then everything started spinning. She had to hold on and went to bed, spinning continued like I'm flying. She felt like she was listing to the right side. No falls. Symptoms progressively worsened, she was instructed by her doctor to go to the ER. On the way, she started feeling a little nauseated, then once in the ER, she could not even speak. All her muscles were so weak like Jello. She could hear and understand, but kept saying the wrong answer. She knew her age but said 58. She was saying nonsense but mostly could not answer questions. She felt like she was drooling. When she tried to lift her arms, they kept dropping, she could not control them. She was still having the spinning sensation. She was seen by Neurology, she had weakness of all extremities, tearful with stuttering speech. With a lot of encouragement and deep breaths, she was able to move all extremities and follow commands. Bloodwork was unremarkable. I personally  reviewed MRI brain without contrast which was normal. CTA head and neck no flow-limiting stenosis. She was discharged home on meclizine  which she took for 2 days with gradual improvement in symptoms. After the episode, her body was in so much pain, she felt like she was going through a meat grinder, everything hurts. She was unable to move and was in bed for a week. Afterwards, she was so scared of having another episode that she stopped PT and did not drive all summer. No further dizziness since June, she is not taking meclizine . She denies any headaches, diplopia, dysarthria/dysphagia, focal numbness/tingling/weakness. She had seen ENT a few months prior to the vertigo, she had been having throat pain for 2 years, pain goes up her right cheek to the right ear. ENT exam was normal. She has been seeing Endo for her thyroid  and notes that with adjustment of medication, the throat pain has disappeared. There is still some tenderness when she touches behind her right ear.   She reports seeing a neurologist in the past for thoracic back pain since she had an epidural in 2002. It was getting better however there is localized pain in the region, sensitive to touch. She has constant pain below her shoulder blades. Pain is so bad sometimes she cannot stand or sit on a hard chair. Either arm can get numb when she is laying down, waking her from sleep.   During the course of the visit, she started reporting chest pain. It started in her stomach like acid reflux (did not take reflux medication today), followed by chest pain. BP  was 124/82. After a few minutes, chest pain completely resolved.    PAST MEDICAL HISTORY: Past Medical History:  Diagnosis Date   Anemia    USE TO HAVE IT,NOT NOW,2015,2016   Arthritis    HIPS,KNEES,HANDS   Breast cancer in female (HCC)    Cancer (HCC)    LEFT   Hyperlipidemia    Hypertension    Low iron    Neuromuscular disorder (HCC)    MUSCLE PAIN,FIBROMYALGIA   Osteopenia     Pulmonary HTN (HCC)    PVC (premature ventricular contraction)    Thyroid  disease     PAST SURGICAL HISTORY: Past Surgical History:  Procedure Laterality Date   LAPAROSCOPIC APPENDECTOMY N/A 03/27/2023   Procedure: APPENDECTOMY LAPAROSCOPIC;  Surgeon: Stechschulte, Deward PARAS, MD;  Location: [facility] OR;  Service: General;  Laterality: N/A;   Left Ovarian removal Left    MASTECTOMY Left    LYMPHNODES REMOVED   RIGHT HEART CATH N/A 07/02/2023   Procedure: RIGHT HEART CATH;  Surgeon: Rolan Ezra RAMAN, MD;  Location[facility] INVASIVE CV LAB;  Service: Cardiovascular;  Laterality: N/A;   RIGHT/LEFT HEART CATH AND CORONARY ANGIOGRAPHY N/A 12/12/2022   Procedure: RIGHT/LEFT HEART CATH AND CORONARY ANGIOGRAPHY;  Surgeon: Darron Deatrice LABOR, MD;  Location: [facility] INVASIVE CV LAB;  Service: Cardiovascular;  Laterality: N/A;    MEDICATIONS: Current Outpatient Medications on File Prior to Visit  Medication Sig Dispense Refill   ambrisentan  (LETAIRIS ) 10 MG tablet Take 1 tablet (10 mg total) by mouth daily. 30 tablet 11   Azelaic Acid  15 % gel Apply to face every morning 50 g 5   bismuth subsalicylate (PEPTO BISMOL) 262 MG/15ML suspension Take 30 mLs by mouth every 6 (six) hours as needed for diarrhea or loose stools.     clobetasol  cream (TEMOVATE ) 0.05 % Apply 1 Application topically as needed. 30 g 4   cromolyn  (OPTICROM ) 4 % ophthalmic solution Place 1 drop into both eyes 4 (four) times daily. 10 mL 4   ELIDEL  1 % cream Apply to red scaly areas on the face BID for seb derm 30 g 4   fluticasone  (FLONASE ) 50 MCG/ACT nasal spray Place 2 sprays into both nostrils daily as needed for allergies or rhinitis.     furosemide  (LASIX ) 40 MG tablet Take 1 tablet (40 mg total) by mouth daily. 90 tablet 1   hydrALAZINE  (APRESOLINE ) 25 MG tablet Take 1 tablet (25 mg total) by mouth 2 (two) times daily. 180 tablet 1   Ivermectin  (SOOLANTRA ) 1 % CREA Apply 1 Application topically at bedtime. 45 g 5   levothyroxine  (SYNTHROID )  112 MCG tablet Take 1 pill everyday except Sunday take half a pill 90 tablet 3   meclizine  (ANTIVERT ) 25 MG tablet Take 1 tablet (25 mg total) by mouth 3 (three) times daily as needed for dizziness. 30 tablet 0   omeprazole  (PRILOSEC) 40 MG capsule Take 40 mg by mouth daily as needed (Heartburn).     ondansetron  (ZOFRAN ) 4 MG tablet Take 1 tablet (4 mg total) by mouth every 8 (eight) hours as needed for nausea or vomiting. 15 tablet 0   potassium chloride  SA (KLOR-CON  M) 20 MEQ tablet Take 1 tablet (20 mEq total) by mouth daily. 90 tablet 1   Selexipag  800 MCG TABS Take 1 tablet (800 mcg total) by mouth 2 (two) times daily. 60 tablet 11   sotatercept (WINREVAIR ) subcutaneous injection Inject 0.5 mL (0.3 mg/kg) subcutaneously for the first dose, then inject 1.1 mL (0.7 mg/kg)  subcutaneously every 3 weeks. Dosing weight 80.9 kg.     tadalafil , PAH, (ADCIRCA ) 20 MG tablet Take 2 tablets (40 mg total) by mouth daily. NDC's covered by plan: 66657972190, [id number], [id number]     valACYclovir  (VALTREX ) 1000 MG tablet Take 1,000 mg by mouth daily as needed (Cold Sores).     No current facility-administered medications on file prior to visit.    ALLERGIES: Allergies  Allergen Reactions   Lisinopril Other (See Comments)    cough     FAMILY HISTORY: Family History  Problem Relation Age of Onset   Hypertension Mother    Leukemia Mother    Stomach cancer Father    Colon cancer Neg Hx    Colon polyps Neg Hx    Crohn's disease Neg Hx    Esophageal cancer Neg Hx    Rectal cancer Neg Hx     SOCIAL HISTORY: Social History   Socioeconomic History   Marital status: Married    Spouse name: Not on file   Number of children: Not on file   Years of education: Not on file   Highest education level: Not on file  Occupational History   Occupation: disabled  Tobacco Use   Smoking status: Never   Smokeless tobacco: Never  Vaping Use   Vaping status: Never Used  Substance and Sexual Activity    Alcohol use: Never   Drug use: Never   Sexual activity: Not Currently  Other Topics Concern   Not on file  Social History Narrative   Are you right handed or left handed? Right hand   Are you currently employed ? no   What is your current occupation?no   Do you live at home alone? no   Who lives with you? son   What type of home do you live in: 1 story or 2 story? 2 story home w/ 5 steps   Social Drivers of Health   Financial Resource Strain: High Risk (05/13/2021)   Received from [facility]   Overall Financial Resource Strain (CARDIA)    Difficulty of Paying Living Expenses: Very hard  Food Insecurity: No Food Insecurity (03/27/2023)   Hunger Vital Sign    Worried About Running Out of Food in the Last Year: Never true    Ran Out of Food in the Last Year: Never true  Transportation Needs: No Transportation Needs (03/27/2023)   PRAPARE - Administrator, Civil Service (Medical): No    Lack of Transportation (Non-Medical): No  Physical Activity: Inactive (05/13/2021)   Received from [facility]   Exercise Vital Sign    On average, how many days per week do you engage in moderate to strenuous exercise (like a brisk walk)?: 0 days    On average, how many minutes do you engage in exercise at this level?: 0 min  Stress: Stress Concern Present (05/13/2021)   Received from [facility] - Occupational Stress Questionnaire    Feeling of Stress : Very much  Social Connections: Unknown (07/13/2021)   Received from [facility]   Social Network    Social Network: Not on file  Intimate Partner Violence: Not At Risk (03/27/2023)   Humiliation, Afraid, Rape, and Kick questionnaire    Fear of Current or Ex-Partner: No    Emotionally Abused: No    Physically Abused: No    Sexually Abused: No     PHYSICAL EXAM: Vitals:   11/20/23 1015  BP: 114/82  Pulse: (!) 101  SpO2: 99%   General: No acute distress Head:   Normocephalic/atraumatic Skin/Extremities: No rash, no edema Neurological Exam: Mental status: alert and awake, no dysarthria or aphasia, Fund of knowledge is appropriate.  Attention and concentration are normal.    Cranial nerves: CN I: not tested CN II: pupils equal, round, visual fields intact CN III, IV, VI:  full range of motion, no nystagmus, no ptosis CN V: reports increased cold on right V2, intact pin CN VII: upper and lower face symmetric CN VIII: hearing intact to conversation CN XI: sternocleidomastoid and trapezius muscles intact CN XII: tongue midline Bulk & Tone: normal, no fasciculations. Motor: 5/5 throughout with no pronator drift. Sensation: reports increased cold on right UE, otherwise intact to pin, intact to all modalities on both LE. Romberg test negative Deep Tendon Reflexes: +1 throughout Cerebellar: no incoordination on finger to nose, heel to shin testing, RAMs Gait: narrow-based and steady, able to tandem walk adequately. Tremor: none   IMPRESSION: This is a 58 year old right-handed woman with a history of anemia, osteopenia, hypertension, pulmonary hypertension, hypothyroidism, fibromyalgia, presenting for evaluation of dizziness that occurred on 08/28/23. She describes significant spinning sensation, followed by diffuse weakness and inability to speak in the ER. Inpatient neurology evaluation showed functional features, likely anxiety related to the vertigo she was having. MRI brain, CTA head and neck normal. Neurological exam today normal. No further similar symptoms since June. Discussed that vertigo was suggestive of peripheral vertigo, and if symptoms recur, would recommend Vestibular therapy. She reports chronic thoracic back pain since epidural in 2002, this has been progressively worsening recently, refer to Ortho. Follow-up as needed, call for any changes.    Thank you for allowing me to participate in the care of this patient. Please do not hesitate to  call for any questions or concerns.   Darice Shivers, M.D.  CC: Dr. Towana, Jolene Cannady, NP

## 2023-11-20 NOTE — Patient Instructions (Signed)
 Good to meet you.  Referral will be sent to Ortho for the thoracic back pain  2. If vertigo recurs, recommend Vestibular therapy  3. Follow-up as needed, call for any changes

## 2023-11-23 ENCOUNTER — Telehealth: Payer: Self-pay | Admitting: Cardiology

## 2023-11-23 NOTE — Telephone Encounter (Signed)
 Called to confirm/remind patient of their appointment at the [facility] on 11/24/23.   Appointment:   [x] Confirmed  [] Left mess   [] No answer/No voice mail  [] VM Full/unable to leave message  [] Phone not in service  Patient reminded to bring all medications and/or complete list.  Confirmed patient has transportation. Gave directions, instructed to utilize valet parking.

## 2023-11-24 ENCOUNTER — Ambulatory Visit: Admitting: "Endocrinology

## 2023-11-24 ENCOUNTER — Encounter: Payer: Self-pay | Admitting: Cardiology

## 2023-11-24 ENCOUNTER — Ambulatory Visit: Attending: Cardiology | Admitting: Cardiology

## 2023-11-24 VITALS — BP 126/90 | HR 90 | Wt 178.0 lb

## 2023-11-24 DIAGNOSIS — R600 Localized edema: Secondary | ICD-10-CM | POA: Insufficient documentation

## 2023-11-24 DIAGNOSIS — M797 Fibromyalgia: Secondary | ICD-10-CM | POA: Diagnosis not present

## 2023-11-24 DIAGNOSIS — I10 Essential (primary) hypertension: Secondary | ICD-10-CM | POA: Insufficient documentation

## 2023-11-24 DIAGNOSIS — R531 Weakness: Secondary | ICD-10-CM | POA: Diagnosis not present

## 2023-11-24 DIAGNOSIS — E039 Hypothyroidism, unspecified: Secondary | ICD-10-CM | POA: Diagnosis not present

## 2023-11-24 DIAGNOSIS — Z7689 Persons encountering health services in other specified circumstances: Secondary | ICD-10-CM | POA: Diagnosis not present

## 2023-11-24 DIAGNOSIS — Z79899 Other long term (current) drug therapy: Secondary | ICD-10-CM | POA: Insufficient documentation

## 2023-11-24 DIAGNOSIS — R197 Diarrhea, unspecified: Secondary | ICD-10-CM | POA: Diagnosis not present

## 2023-11-24 DIAGNOSIS — I2721 Secondary pulmonary arterial hypertension: Secondary | ICD-10-CM | POA: Insufficient documentation

## 2023-11-24 DIAGNOSIS — G4733 Obstructive sleep apnea (adult) (pediatric): Secondary | ICD-10-CM | POA: Insufficient documentation

## 2023-11-24 NOTE — Patient Instructions (Signed)
 Referrals:  We have placed a referral today to Pulmonary Rehabilitation for your Pulmonary Hypertension. They should reach out to you within 1-2 weeks. If they do not, please reach out to them. Their information is below on this AVS.   Special Instructions // Education:  Go over to the [facility]. Go pass the gift shop and have your blood work completed on September 29th.  We will only call you if the results are abnormal or if the provider would like to make medication changes.  No news is good news.   Follow-Up in: 3 months with Dr. Rolan.   Thank you for choosing [facility].    At the [facility], you and your health needs are our priority. We have a designated team specialized in the treatment of Heart Failure. This Care Team includes your primary Heart Failure Specialized Cardiologist (physician), Advanced Practice Providers (APPs- Physician Assistants and Nurse Practitioners), and Pharmacist who all work together to provide you with the care you need, when you need it.   You may see any of the following providers on your designated Care Team at your next follow up:  Dr. Toribio Fuel Dr. Ezra Rolan Dr. Ria Commander Dr. Morene Brownie Ellouise Class, FNP Jaun Bash, RPH-CPP  Please be sure to bring in all your medications bottles to every appointment.   Need to Contact Us :  If you have any questions or concerns before your next appointment please send us  a message through [place] or call our office at [phone].    TO LEAVE A MESSAGE FOR THE NURSE SELECT OPTION 2, PLEASE LEAVE A MESSAGE INCLUDING: YOUR NAME DATE OF BIRTH CALL BACK NUMBER REASON FOR CALL**this is important as we prioritize the call backs  YOU WILL RECEIVE A CALL BACK THE SAME DAY AS LONG AS YOU CALL BEFORE 4:00 PM

## 2023-11-24 NOTE — Progress Notes (Signed)
 [facility] NOTE  Primary Care: Valerio Melanie DASEN, NP HF Cardiology: Dr. Rolan  Chief Complaint: Pulmonary hypertension  HPI: Tonya Odonnell is a 58 y.o. female with pulmonary hypertension, hypertension, breast cancer status postmastectomy and chemotherapy in 2017 complicated by severe neuropathic/psychosomatic pain, hypothyroidism and fibromyalgia.  She was admitted 10/24 at [facility] with progressive  dyspnea as well as pain.  Prior to that she was seen by [facility] rheumatology and diagnosed with fibromyalgia.  2 months prior to her hospitalization she reports having progressive exertional dyspnea with shortness of breath just walking to her mailbox.  2 weeks prior to admission this progressed to chest tightness both at rest and with exertion.  During her hospitalization, echocardiogram was done showing EF of 50 to 55% with D-shaped interventricular septum.  She underwent right and left heart catheterization that was significant for pulmonary hypertension with a PVR of 7.3 Wood units.  CTA chest during this admission showed no PE, no parenchymal lung disease.  PFTs in 10/24 were surprisingly normal.  V/Q scan in 10/24 showed no evidence for chronic PE.  She was subsequently started on tadalafil  20 mg and PH evaluation was started.  Sleep study showed mild OSA, pulmonary did not recommend CPAP.   In 1/25, she developed appendicitis and had uncomplicated laparoscopic appendectomy at [facility].   Repeat RHC in 4/25 showed PA pressure 57/23 mean 35, CI 3.13, PVR 5.2 WU.  Echo in 6/25 showed EF 65-70%, mild RV dysfunction with normal size, IVC normal, unable to estimate PASP.   Patient was started on sotatercept but when dose increased to goal, hgb rose to 17.5.    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  No further vertigo or dizziness episodes.  She feels weak in general.  Still with loose stool that she thinks is due to Uptravi .  She felt like her  breathing was better on lower dose of sotatercept but worse on higher dose.  Still dyspneic with moderate exertion.  No chest pain.  She recently increased her Lasix  to 40 mg daily due to lower extremity edema which has now resolved.   Labs (2/24): CCP negative, RF negative Labs (10/24): BNP 42, anti-SCL 70 negative, ANA negative, HIV negative Labs (11/24): K 4, creatinine 0.88 Labs (1/25): K 3.6, creatinine 0.86, LFTs normal, hgb 13.5, BNP 8 Labs (3/25): TSH normal Labs (6/25): K 4.5, creatinine 0.81, hgb 15.6 Labs (9/25): hgb 17.5, K 3.7, creatinine 0.82  6 minute walk (12/24): 457 m 6 minute walk (3/25): 488 m 6 minute walk (7/25): 396 m  PMH: 1. Fibromyalgia: Has seen rheumatology at [facility].  2. HTN 3. Hypothyroidism 4. Breast cancer: 2017, Treated with chemotherapy and mastectomy.  5. Pulmonary hypertension: Echo (10/24) with EF 50-55%, D-shaped septum suggestive of RV pressure/volume overload, RV poorly visualized but appeared dilated and dysfunctional.   - RHC/LHC (10/24): No significant CAD; mean RA 11, PA 76/38 mean 52, mean PCWP 10, PVR 7.3 WU, CI 3.04 - V/Q scan (10/24): No evidence for chronic PE.  - CTA chest (10/24): No acute PE, no evidence for lung parenchymal disease.  - PFTs (10/24): FVC 92%, FEV1 102%, TLC 101%, DLCO normal (surprising).   - Sleep study (10/24): mild OSA.  - CCP/RF negative. Anti-SCL70, HIV, and ANA negative.  - RHC (4/25): mean RA 5, PA 57/23 mean 35, mean PCWP 5, CI 3.13, PVR 5.2 WU - Echo (6/25): EF 65-70%, mild RV dysfunction with normal size, IVC normal, unable to  estimate PASP.  6. Appendicitis: Appendectomy 1/25.  7. BPPV  SH: Nonsmoker, no ETOH, married, originally from [place].   FH: No pulmonary hypertension or cardiomyopathy that she knows of.   ROS: All systems reviewed and negative except as per HPI.    Current Outpatient Medications  Medication Sig Dispense Refill   ambrisentan  (LETAIRIS ) 10 MG tablet Take 1 tablet (10 mg total)  by mouth daily. 30 tablet 11   Azelaic Acid  15 % gel Apply to face every morning 50 g 5   bismuth subsalicylate (PEPTO BISMOL) 262 MG/15ML suspension Take 30 mLs by mouth every 6 (six) hours as needed for diarrhea or loose stools.     clobetasol  cream (TEMOVATE ) 0.05 % Apply 1 Application topically as needed. 30 g 4   cromolyn  (OPTICROM ) 4 % ophthalmic solution Place 1 drop into both eyes 4 (four) times daily. 10 mL 4   ELIDEL  1 % cream Apply to red scaly areas on the face BID for seb derm 30 g 4   fluticasone  (FLONASE ) 50 MCG/ACT nasal spray Place 2 sprays into both nostrils daily as needed for allergies or rhinitis.     furosemide  (LASIX ) 40 MG tablet Take 1 tablet (40 mg total) by mouth daily. 90 tablet 1   hydrALAZINE  (APRESOLINE ) 25 MG tablet Take 1 tablet (25 mg total) by mouth 2 (two) times daily. 180 tablet 1   Ivermectin  (SOOLANTRA ) 1 % CREA Apply 1 Application topically at bedtime. 45 g 5   levothyroxine  (SYNTHROID ) 112 MCG tablet Take 1 pill everyday except Sunday take half a pill 90 tablet 3   meclizine  (ANTIVERT ) 25 MG tablet Take 1 tablet (25 mg total) by mouth 3 (three) times daily as needed for dizziness. 30 tablet 0   omeprazole  (PRILOSEC) 40 MG capsule Take 40 mg by mouth daily as needed (Heartburn).     ondansetron  (ZOFRAN ) 4 MG tablet Take 1 tablet (4 mg total) by mouth every 8 (eight) hours as needed for nausea or vomiting. 15 tablet 0   potassium chloride  SA (KLOR-CON  M) 20 MEQ tablet Take 1 tablet (20 mEq total) by mouth daily. 90 tablet 1   Selexipag  800 MCG TABS Take 1 tablet (800 mcg total) by mouth 2 (two) times daily. 60 tablet 11   sotatercept (WINREVAIR ) subcutaneous injection Inject 0.5 mL (0.3 mg/kg) subcutaneously for the first dose, then inject 1.1 mL (0.7 mg/kg) subcutaneously every 3 weeks. Dosing weight 80.9 kg.     tadalafil , PAH, (ADCIRCA ) 20 MG tablet Take 2 tablets (40 mg total) by mouth daily. NDC's covered by plan: 66657972190, [id number], [id number]      valACYclovir  (VALTREX ) 1000 MG tablet Take 1,000 mg by mouth daily as needed (Cold Sores).     No current facility-administered medications for this visit.    Allergies  Allergen Reactions   Lisinopril Other (See Comments)    cough     PHYSICAL EXAM: BP (!) 126/90 (BP Location: Right Arm, Patient Position: Sitting, Cuff Size: Large)   Pulse 90   Wt 178 lb (80.7 kg)   LMP  (LMP Unknown)   SpO2 96%   BMI 31.53 kg/m  General: NAD Neck: No JVD, no thyromegaly or thyroid  nodule.  Lungs: Clear to auscultation bilaterally with normal respiratory effort. CV: Nondisplaced PMI.  Heart regular S1/S2, no S3/S4, no murmur.  Trace ankle edema.  No carotid bruit.  Normal pedal pulses.  Abdomen: Soft, nontender, no hepatosplenomegaly, no distention.  Skin: Intact without lesions or rashes.  Neurologic: Alert  and oriented x 3.  Psych: Normal affect. Extremities: No clubbing or cyanosis.  HEENT: Normal.   ASSESSMENT & PLAN:  1. Pulmonary hypertension/RV failure: Echo in 10/24 showed EF 50-55%; RV was poorly visualized but there was a D-shaped interventricular septum and concern for RV dilation/dysfunction.  RHC/LHC in 10/24 showed no CAD; mean RA 11, PA 76/38 mean 52, mean PWP 10, PVR 7.3 WU and CI 3.04.  PE CT 10/24 showed no acute PE and no parenchymal lung disease.  V/Q scan 10/24 showed no chronic PE.  PFTs in 10/24 were normal (including, surprisingly, DLCO).  Rheumatology serologies and HIV were negative.  Sleep study showed mild OSA.  No FH of pulmonary hypertension.  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 Repeat echo in 6/25 appeared to show improved RV function; EF 65-70%, mild RV dysfunction with normal size, IVC normal, unable to estimate PASP.  Repeat RHC in 4/25 showed moderate pulmonary arterial hypertension (improved from prior RHC) with preserved cardiac output.  NYHA class II-III, not significantly volume overloaded today.  - Continue Lasix  40  mg daily.  Recent BMET was stable.     - Continue tadalafil  40 mg daily.  - Continue ambrisentan  10 mg daily.    - Continue selexipag  800 mcg bid. With ongoing diarrhea, she wants to hold off further increase for now.  - Hgb rose significantly to 17.5 on goal sotatercept dose.  I will have her hold the next dose and we will get repeat CBC on 9/26.  If hgb < 16.5, will start sotatercept at lower dose 0.3 mg/kg.  - Refer back to pulmonary rehab.  2. HTN: BP controlled, she is only taking hydralazine  25 mg bid.  3. Fibromyalgia: With diffuse pain.  Evaluation has not found a specific rheumatologic diagnosis.   She will followup with me in 3 months.   I spent 31 minutes reviewing records, interviewing/examining patient, and managing order   Ezra Shuck 11/24/2023

## 2023-11-26 DIAGNOSIS — Z7689 Persons encountering health services in other specified circumstances: Secondary | ICD-10-CM | POA: Diagnosis not present

## 2023-11-27 DIAGNOSIS — Z7689 Persons encountering health services in other specified circumstances: Secondary | ICD-10-CM | POA: Diagnosis not present

## 2023-11-27 NOTE — Progress Notes (Signed)
 [facility] [address] [place] [address] Phone: [phone]   Assessment and Plan:     1. Chronic bilateral thoracic back pain (Primary) -Chronic with exacerbation, initial visit - Patient complains of mid back pain present for 23+ years.  She relates the pain to starting after an epidural during pregnancy.  Most consistent with musculoskeletal dysfunction - X-ray obtained in clinic.  My interpretation: No acute fracture or vertebral collapse. - Start meloxicam  15 mg daily for 3 weeks.  Will not plan on long-term NSAID use due to potential side effects - Do not recommend prednisone  use due to recent diagnosis and workup with pulmonary hypertension - Start HEP and physical therapy for mid back   15 additional minutes spent for educating Therapeutic Home Exercise Program.  This included exercises focusing on stretching, strengthening, with focus on eccentric aspects.   Long term goals include an improvement in range of motion, strength, endurance as well as avoiding reinjury. Patient's frequency would include in 1-2 times a day, 3-5 times a week for a duration of 6-12 weeks. Proper technique shown and discussed handout in great detail with ATC.  All questions were discussed and answered.    Pertinent previous records reviewed include none   Follow Up: 6 weeks for reevaluation.  If no improvement or worsening of symptoms, could consider thoracic MRI   Subjective:   I, Tonya Odonnell, am serving as a Neurosurgeon for Doctor Morene Mace  Chief Complaint: back pain   HPI:   11/30/2023 Patient is a 58 year old female with back pain. Patient states pain for 23 years. Hx of epidural when giving birth. Pain increased over the past couple of years. Has pain when sitting and standing. She is not able to do ADL. Pain is in the thoracic area. No meds for the pain . When she is at rest pain is relieved. No numbness or tingling. Hx of  fibromyalgia from swine flu vaccine. Hx of breast cancer and pulmonary hypertension. Has tried theragun and she is screaming from the pain.   Relevant Historical Information: Pulmonary hypertension, GERD, prediabetes  Additional pertinent review of systems negative.   Current Outpatient Medications:    ambrisentan  (LETAIRIS ) 10 MG tablet, Take 1 tablet (10 mg total) by mouth daily., Disp: 30 tablet, Rfl: 11   Azelaic Acid  15 % gel, Apply to face every morning, Disp: 50 g, Rfl: 5   bismuth subsalicylate (PEPTO BISMOL) 262 MG/15ML suspension, Take 30 mLs by mouth every 6 (six) hours as needed for diarrhea or loose stools., Disp: , Rfl:    clobetasol  cream (TEMOVATE ) 0.05 %, Apply 1 Application topically as needed., Disp: 30 g, Rfl: 4   cromolyn  (OPTICROM ) 4 % ophthalmic solution, Place 1 drop into both eyes 4 (four) times daily., Disp: 10 mL, Rfl: 4   ELIDEL  1 % cream, Apply to red scaly areas on the face BID for seb derm, Disp: 30 g, Rfl: 4   fluticasone  (FLONASE ) 50 MCG/ACT nasal spray, Place 2 sprays into both nostrils daily as needed for allergies or rhinitis., Disp: , Rfl:    furosemide  (LASIX ) 40 MG tablet, Take 1 tablet (40 mg total) by mouth daily., Disp: 90 tablet, Rfl: 1   hydrALAZINE  (APRESOLINE ) 25 MG tablet, Take 1 tablet (25 mg total) by mouth 2 (two) times daily., Disp: 180 tablet, Rfl: 1   Ivermectin  (SOOLANTRA ) 1 % CREA, Apply 1 Application topically at bedtime., Disp: 45 g, Rfl: 5  levothyroxine  (SYNTHROID ) 112 MCG tablet, Take 1 pill everyday except Sunday take half a pill, Disp: 90 tablet, Rfl: 3   meclizine  (ANTIVERT ) 25 MG tablet, Take 1 tablet (25 mg total) by mouth 3 (three) times daily as needed for dizziness., Disp: 30 tablet, Rfl: 0   omeprazole  (PRILOSEC) 40 MG capsule, Take 40 mg by mouth daily as needed (Heartburn)., Disp: , Rfl:    ondansetron  (ZOFRAN ) 4 MG tablet, Take 1 tablet (4 mg total) by mouth every 8 (eight) hours as needed for nausea or vomiting., Disp: 15  tablet, Rfl: 0   potassium chloride  SA (KLOR-CON  M) 20 MEQ tablet, Take 1 tablet (20 mEq total) by mouth daily., Disp: 90 tablet, Rfl: 1   Selexipag  800 MCG TABS, Take 1 tablet (800 mcg total) by mouth 2 (two) times daily., Disp: 60 tablet, Rfl: 11   sotatercept (WINREVAIR ) subcutaneous injection, Inject 0.5 mL (0.3 mg/kg) subcutaneously for the first dose, then inject 1.1 mL (0.7 mg/kg) subcutaneously every 3 weeks. Dosing weight 80.9 kg., Disp: , Rfl:    tadalafil , PAH, (ADCIRCA ) 20 MG tablet, Take 2 tablets (40 mg total) by mouth daily. NDC's covered by plan: [id number], [id number], [id number], Disp: , Rfl:    valACYclovir  (VALTREX ) 1000 MG tablet, Take 1,000 mg by mouth daily as needed (Cold Sores)., Disp: , Rfl:    Objective:     Vitals:   11/30/23 1432  Pulse: (!) 110  SpO2: 98%  Weight: 172 lb (78 kg)  Height: 5' 3 (1.6 m)      Body mass index is 30.47 kg/m.    Physical Exam:    Gen: Appears well, nad, nontoxic and pleasant Psych: Alert and oriented, appropriate mood and affect Neuro: sensation intact, strength is 5/5 in upper and lower extremities, muscle tone wnl Skin: no susupicious lesions or rashes  Back - Normal skin, Spine with normal alignment and no deformity.     tenderness to thoracic vertebral process palpation.   Bilateral thoracic paraspinous muscles are   tender and without spasm Pain with thoracic flexion and extension Gait normal    Electronically signed by:  Odis Mace D.CLEMENTE[facility] 2:52 PM 11/30/23

## 2023-11-30 ENCOUNTER — Ambulatory Visit

## 2023-11-30 ENCOUNTER — Other Ambulatory Visit: Payer: Self-pay

## 2023-11-30 ENCOUNTER — Ambulatory Visit: Admitting: Sports Medicine

## 2023-11-30 ENCOUNTER — Other Ambulatory Visit: Payer: Self-pay | Admitting: Pharmacy Technician

## 2023-11-30 VITALS — HR 110 | Ht 63.0 in | Wt 172.0 lb

## 2023-11-30 DIAGNOSIS — M546 Pain in thoracic spine: Secondary | ICD-10-CM

## 2023-11-30 DIAGNOSIS — G8929 Other chronic pain: Secondary | ICD-10-CM | POA: Diagnosis not present

## 2023-11-30 DIAGNOSIS — Z7689 Persons encountering health services in other specified circumstances: Secondary | ICD-10-CM | POA: Diagnosis not present

## 2023-11-30 MED ORDER — MELOXICAM 15 MG PO TABS: 15.0000 mg | ORAL_TABLET | Freq: Every day | ORAL | 0 refills | Status: AC

## 2023-11-30 NOTE — Progress Notes (Signed)
 Specialty Pharmacy Refill Coordination Note  Tonya Odonnell is a 58 y.o. female contacted today regarding refills of specialty medication(s) Ambrisentan  (LETAIRIS )   Patient requested Delivery   Delivery date: 12/08/23   Verified address: [address]   [place] [id number]   Medication will be filled on 12/07/23.

## 2023-11-30 NOTE — Patient Instructions (Signed)
-   Start meloxicam  15 mg daily x3 weeks. May use remaining NSAID as needed once daily for pain control.  Do not to use additional over-the-counter NSAIDs (ibuprofen , naproxen, Advil , Aleve, etc.) while taking prescription NSAIDs.  May use Tylenol  [phone] mg 2 to 3 times a day for breakthrough pain.  PT referral   Thoracic HEP   6 week follow up

## 2023-12-07 ENCOUNTER — Ambulatory Visit: Payer: Self-pay | Admitting: Sports Medicine

## 2023-12-07 ENCOUNTER — Other Ambulatory Visit: Payer: Self-pay

## 2023-12-07 ENCOUNTER — Other Ambulatory Visit (HOSPITAL_COMMUNITY)

## 2023-12-07 DIAGNOSIS — M25561 Pain in right knee: Secondary | ICD-10-CM | POA: Diagnosis not present

## 2023-12-07 DIAGNOSIS — H814 Vertigo of central origin: Secondary | ICD-10-CM | POA: Diagnosis not present

## 2023-12-07 DIAGNOSIS — M797 Fibromyalgia: Secondary | ICD-10-CM | POA: Diagnosis not present

## 2023-12-07 DIAGNOSIS — M25562 Pain in left knee: Secondary | ICD-10-CM | POA: Diagnosis not present

## 2023-12-07 DIAGNOSIS — G8929 Other chronic pain: Secondary | ICD-10-CM | POA: Diagnosis not present

## 2023-12-07 DIAGNOSIS — M25552 Pain in left hip: Secondary | ICD-10-CM | POA: Diagnosis not present

## 2023-12-07 DIAGNOSIS — M25551 Pain in right hip: Secondary | ICD-10-CM | POA: Diagnosis not present

## 2023-12-07 DIAGNOSIS — M25742 Osteophyte, left hand: Secondary | ICD-10-CM | POA: Diagnosis not present

## 2023-12-07 DIAGNOSIS — M25741 Osteophyte, right hand: Secondary | ICD-10-CM | POA: Diagnosis not present

## 2023-12-07 NOTE — Progress Notes (Signed)
 Rheumatology Follow Up Clinic Note   Date of visit:12/07/2023  Subjective  HPI:  History of Present Illness Tonya Odonnell is a 58 year old female with heart failure and pulmonary hypertension who presents with chronic pain and a possible diagnosis of ankylosing spondylitis. She was referred by her cardiologist for evaluation of possible ankylosing spondylitis.  She has a long-standing history of pain that began after completing chemotherapy for cancer. The pain affects every muscle and bone, causing difficulty sleeping as it wakes her up. She experiences numbness and a sensation of her right side, particularly her right leg, feeling 'not mine,' with occasional temperature differences. This began after starting a new medication two months ago, which was stopped due to increased inflammation and elevated hemoglobin levels.  She has a history of heart failure and was diagnosed with pulmonary hypertension last year. She is on tadalafil , which has provided some relief, allowing her to bend more easily. She also takes Uptravi  and Letairis , which cause nasal congestion and dryness in her mouth. Flonase  is used to alleviate nasal congestion.  She has experienced joint swelling in the past, with a notable bump that has since resolved. Steroids previously helped reduce fluid retention and skin pain, but she associates their use with the development of pulmonary hypertension. Her skin was puffy and painful, but this resolved after steroid treatment. However, she experienced significant weakness and immobility while on steroids.  She reports difficulty with physical activities such as cooking and cleaning due to back pain, which prevents her from sitting or standing for long periods. She saw a spine doctor last week who performed spine imaging and said he could not see the nerves or anything, and that an MRI would require insurance approval after physical therapy. She was prescribed meloxicam  but has not taken  it due to concerns about heart failure.  She has a history of thyroid  disease, which has recently normalized after two years of treatment. She experiences extreme heat and sweating, which she attributes to her thyroid  medication, levothyroxine , taken at 112 mcg on Sundays.  She has a history of polyarthritis at age 2, which resolved after treatment with mud therapy in [place]. She describes her current symptoms as similar to those experienced during that episode.  She underwent a sleep study last year, which indicated mild sleep apnea, but she was not prescribed a CPAP machine. She wakes with a dry mouth and believes she may snore. She discontinued cardiac rehabilitation due to severe vertigo, which she associates with exercise and medication effects.   Review of Systems  A 10+ ROS was reviewed. Positive and pertinent negative findings are documented in the HPI, and all the rest are negative PMH/FH/SH reviewed, no changes. Disease Assessment Scores:    04/10/2022    9:33 AM 12/07/2023    9:08 AM  Rapid 3 Score Only  Rapid 3 Total Score 6.67 7.06   Current Outpatient Medications  Medication Sig Dispense Refill  . ascorbic acid , vitamin C , (VITAMIN C ) 500 MG tablet Take 500 mg by mouth once daily    . cholecalciferol  (VITAMIN D3) 1000 unit tablet Take by mouth    . clobetasoL  (TEMOVATE ) 0.05 % cream Apply topically    . cromolyn  (CROLOM ) 4 % ophthalmic solution Apply 1 drop to eye 4 (four) times daily    . cyanocobalamin  (VITAMIN B12) 1000 MCG tablet Take 1,000 mcg by mouth once daily    . ELIDEL  1 % cream Apply to red scaly areas on the face BID for  seb derm    . FUROsemide  (LASIX ) 20 MG tablet Take 20 mg by mouth once daily    . hydrALAZINE  (APRESOLINE ) 25 MG tablet Take 25 mg by mouth 2 (two) times daily    . levothyroxine  (SYNTHROID ) 112 MCG tablet Take 112 mcg by mouth once daily Take on an empty stomach with a glass of water at least 30-60 minutes before breakfast.    . olopatadine  (PATANOL) 0.1 % ophthalmic solution 1 drop 2 (two) times daily    . tadalafiL  (CIALIS ) 20 MG tablet Take 40 mg by mouth once daily    . valACYclovir  (VALTREX ) 1000 MG tablet Take 1,000 mg by mouth once daily    . hydroxychloroquine (PLAQUENIL) 200 mg tablet Take 2 tablets (400 mg total) by mouth once daily for 180 days 180 tablet 1   No current facility-administered medications for this visit.     Objective   Physical Exam: BP (!) 132/94 (BP Location: Right upper arm, Patient Position: Sitting, BP Cuff Size: Large Adult)   Pulse 89   Ht 160 cm (5' 3)   Wt 79 kg (174 lb 3.2 oz)   BMI 30.86 kg/m   Gen: WDWG, NAD. HEENT: No scalp tenderness, ocular inflammation, no mucosal ulcers, dentition okay, moist mucous membranes. EXT: Warm, no edema. SKIN: No obvious rashes or lesions. No Raynaud's or digital ulceration. NAILS: No dystrophic changes, pitting. NEURO: A&Ox4, CN grossly intact, 5/5 strength throughout, sensation intact throughout, gait normal. MSK: TTP hands, elbows, shoulders, knees ankles and feet without synovitis or arthritis. Extensive tender points on palpation  Neck: full painless ROM Shoulders: full, painless ROM Elbows: full, painless ROM; no effusions Wrists:  full, painless ROM; no effusions Hands:  no synovitis Hips: full, painless ROM Knees: full, painless ROM; no effusions  Ankles: full, painless ROM; no effusions Feet: no pain with midfoot rotation; no tenderness on MTP squeeze  Labs: Labs in chart were reviewed.  No results found for: WBC, HGB, HCT, MCV, PLT  No results found for: ALT, AST, GGT, ALKPHOS, BILITOT No results found for: CREATININE, BUN, NA, K, CL, CO2  No results found for: TPCRERATIO, PROTEINUA, NITRITE, LEUKOCYTESUR, RBCUA, WBCUA, SQUAMEPI, CASTUA, BACTERIA   Recent Labs    12/07/23 1040  C3 135  C4 26  DSDNA 74*  ESR 17  CRP 0.16     Autoantibody Testing:  Lab Results   Component Value Date   RF <8 12/07/2023   CCP <0.5 12/07/2023   SMAB <0.2 12/07/2023   RNPAB <0.2 12/07/2023   DSDNA 74 (H) 12/07/2023   C3 135 12/07/2023   C4 26 12/07/2023    Imaging: Imaging in chart was reviewed.  Assessment & Plan   Assessment & Plan Tonya Odonnell is a 58 y.o. female who returns to rheumatology clinic for follow-up of chronic musculoskeletal pain with muscle weakness.  Chronic musculoskeletal pain with associated muscle weakness, exacerbated by physical activity and improved with rest. Pain is diffuse, affecting joints and muscles, with a history of polyarthritis in adolescence. Symptoms are worse in the evening and after physical exertion. Previous steroid treatment provided temporary relief but was associated with adverse effects. Current symptoms include numbness and altered sensation in the right leg, possibly related to recent medication changes. Differential includes muscle weakness due to chronic illness and potential autoimmune etiology. Evaluation for ankylosing spondylitis and other autoimmune diseases due to chronic pain and pulmonary symptoms. Ankylosing spondylitis is considered unlikely due to symptom pattern, but testing is warranted to rule  out autoimmune causes of lung disease and musculoskeletal pain.  Pulmonary hypertension and heart failure diagnosed last year, possibly exacerbated by previous steroid use. Symptoms include dizziness and vertigo, potentially related to medication-induced vasodilation and reduced cerebral perfusion. Current medications include tadalafil , Uptravi , and Letairis , which may contribute to nasal congestion and dry mouth.  1. Chronic arthralgias of knees and hips 2. Fibromyalgia  -     C-Reactive Protein (CRP), Inflammatory; Future -     Sedimentation Rate-Automated; Future -     HLA B 27 Genotyping; Future -     25 OH Vitamin D; Future -     Uric Acid; Future -     X-ray sacroiliac joints 3 plus views; Future -      X-ray lumbar spine 2 to 3 views; Future -     C4 Complement; Future -     C3 Complement; Future -     Extractable Nuclear Antigen Panel; Future -     Rheumatoid Factor; Future -     Cyclic Citrullinated Peptide Abs; Future -     X-ray hand left minimum 3 views; Future -     X-ray hand right minimum 3 views; Future -     Follow up in Rheumatology; Future   - Continue physical therapy as recommended by spine clinic and cardiologist. - Order x-rays to evaluate for musculoskeletal abnormalities. - Perform blood work to assess for inflammatory markers and potential autoimmune causes. - plan to start HCQ pending labs       The primary encounter diagnosis was Chronic arthralgias of knees and hips. A diagnosis of Fibromyalgia was also pertinent to this visit. Orders Placed This Encounter  Procedures  . X-ray sacroiliac joints 3 plus views    Standing Status:   Future    Number of Occurrences:   1    Expected Date:   12/07/2023    Expiration Date:   12/06/2024    Reason for Exam: (Free Text):   evaluate for evidence of sacroillitis    Is the patient pregnant?:   No    This procedure is enabled for patient scheduling in MyChart. Please indicate if the patient should not be able to schedule an appointment for this procedure in MyChart.:   Allow MyChart Scheduling    Release to patient:   Immediate  . X-ray lumbar spine 2 to 3 views    Standing Status:   Future    Number of Occurrences:   1    Expiration Date:   12/06/2024    Is the patient pregnant?:   No    Presence of Red Flag symptoms?:   severe radicular symptoms    This procedure is enabled for patient scheduling in MyChart. Please indicate if the patient should not be able to schedule an appointment for this procedure in MyChart.:   Allow MyChart Scheduling    Release to patient:   Immediate  . X-ray hand left minimum 3 views    Standing Status:   Future    Number of Occurrences:   1    Expected Date:   12/07/2023    Expiration  Date:   12/06/2024    Reason for Exam: (Free Text):   evaluate for inflammatory arthritis    Is the patient pregnant?:   No    This procedure is enabled for patient scheduling in MyChart. Please indicate if the patient should not be able to schedule an appointment for this procedure in MyChart.:   Allow MyChart  Scheduling    Release to patient:   Immediate  . X-ray hand right minimum 3 views    Standing Status:   Future    Number of Occurrences:   1    Expected Date:   12/07/2023    Expiration Date:   12/06/2024    Reason for Exam: (Free Text):   evaluate for inflammatory arthritis    Is the patient pregnant?:   No    This procedure is enabled for patient scheduling in MyChart. Please indicate if the patient should not be able to schedule an appointment for this procedure in MyChart.:   Allow MyChart Scheduling    Release to patient:   Immediate  . C-Reactive Protein (CRP), Inflammatory    Standing Status:   Future    Number of Occurrences:   1    Expected Date:   12/07/2023    Expiration Date:   04/07/2024    Release to patient:   Immediate  . Sedimentation Rate-Automated    Standing Status:   Future    Number of Occurrences:   1    Expected Date:   12/07/2023    Expiration Date:   04/07/2024    Release to patient:   Immediate  . HLA B 27 Genotyping    Standing Status:   Future    Number of Occurrences:   1    Expected Date:   12/07/2023    Expiration Date:   04/07/2024    Release to patient:   Immediate  . 25 OH Vitamin D    Standing Status:   Future    Number of Occurrences:   1    Expected Date:   12/07/2023    Expiration Date:   04/07/2024    Release to patient:   Immediate  . Uric Acid    Standing Status:   Future    Number of Occurrences:   1    Expected Date:   12/07/2023    Expiration Date:   04/07/2024    Release to patient:   Immediate  . C4 Complement    Standing Status:   Future    Number of Occurrences:   1    Expected Date:   12/07/2023    Expiration Date:   04/07/2024     Release to patient:   Immediate  . C3 Complement    Standing Status:   Future    Number of Occurrences:   1    Expected Date:   12/07/2023    Expiration Date:   04/07/2024    Release to patient:   Immediate  . Extractable Nuclear Antigen Panel    Standing Status:   Future    Number of Occurrences:   1    Expected Date:   12/07/2023    Expiration Date:   04/07/2024    Release to patient:   Immediate  . Rheumatoid Factor    Standing Status:   Future    Number of Occurrences:   1    Expected Date:   12/07/2023    Expiration Date:   04/07/2024    Release to patient:   Immediate  . Cyclic Citrullinated Peptide Abs    Standing Status:   Future    Number of Occurrences:   1    Expected Date:   12/07/2023    Expiration Date:   04/07/2024    Release to patient:   Immediate   Requested Prescriptions   Signed Prescriptions Disp Refills  .  hydroxychloroquine (PLAQUENIL) 200 mg tablet 180 tablet 1    Sig: Take 2 tablets (400 mg total) by mouth once daily for 180 days    Bone health: DEXA deferred at this time Pt will continue with Ca+Vit D supplementation Contraception: Post-menopausal   Immunizations:    There is no immunization history on file for this patient.  Follow up with POORVA JAYANT APTE, MD in 2 months (on 02/06/2024).   Future Appointments    Date/Time Provider Department Center Visit Type   04/08/2024 8:20 AM (Arrive by 8:05 AM) Apte, Poorva Jayant, MD [facility] [place] RHEUM VIDEO VISIT RETURN       This visit was coded based on time. I spent a total of 50 minutes in both face-to-face and non-face-to-face activities, excluding procedures performed, for this visit on the date of this encounter.   This note has been created using automated tools and reviewed for accuracy by POORVA JAYANT APTE.   Claiborne Camera, MD [facility] Pager: [phone]

## 2023-12-08 ENCOUNTER — Ambulatory Visit (HOSPITAL_COMMUNITY)
Admission: RE | Admit: 2023-12-08 | Discharge: 2023-12-08 | Disposition: A | Discharge status: Home / Self Care | Source: Ambulatory Visit | Attending: Cardiology | Admitting: Cardiology

## 2023-12-08 DIAGNOSIS — I2721 Secondary pulmonary arterial hypertension: Secondary | ICD-10-CM | POA: Insufficient documentation

## 2023-12-08 DIAGNOSIS — Z7689 Persons encountering health services in other specified circumstances: Secondary | ICD-10-CM | POA: Diagnosis not present

## 2023-12-08 LAB — CBC
HCT: 50.1 % — ABNORMAL HIGH (ref 36.0–46.0)
Hemoglobin: 16.7 g/dL — ABNORMAL HIGH (ref 12.0–15.0)
MCH: 28.4 pg (ref 26.0–34.0)
MCHC: 33.3 g/dL (ref 30.0–36.0)
MCV: 85.2 fL (ref 80.0–100.0)
Platelets: 345 K/uL (ref 150–400)
RBC: 5.88 MIL/uL — ABNORMAL HIGH (ref 3.87–5.11)
RDW: 14.6 % (ref 11.5–15.5)
WBC: 7 K/uL (ref 4.0–10.5)
nRBC: 0 % (ref 0.0–0.2)

## 2023-12-09 DIAGNOSIS — H814 Vertigo of central origin: Secondary | ICD-10-CM | POA: Diagnosis not present

## 2023-12-10 ENCOUNTER — Encounter: Payer: Self-pay | Admitting: Emergency Medicine

## 2023-12-10 ENCOUNTER — Encounter: Attending: Cardiology | Admitting: Emergency Medicine

## 2023-12-10 ENCOUNTER — Other Ambulatory Visit: Payer: Self-pay

## 2023-12-10 DIAGNOSIS — H814 Vertigo of central origin: Secondary | ICD-10-CM | POA: Diagnosis not present

## 2023-12-10 DIAGNOSIS — I5022 Chronic systolic (congestive) heart failure: Secondary | ICD-10-CM | POA: Insufficient documentation

## 2023-12-10 DIAGNOSIS — I2721 Secondary pulmonary arterial hypertension: Secondary | ICD-10-CM | POA: Insufficient documentation

## 2023-12-10 NOTE — Progress Notes (Signed)
 Initial phone call completed. Diagnosis can be found in [facility] 11/24/2023. EP Orientation scheduled for Tuesday, 12/15/2023.

## 2023-12-14 DIAGNOSIS — H814 Vertigo of central origin: Secondary | ICD-10-CM | POA: Diagnosis not present

## 2023-12-15 ENCOUNTER — Encounter

## 2023-12-15 VITALS — Ht 63.5 in | Wt 176.2 lb

## 2023-12-15 DIAGNOSIS — Z7689 Persons encountering health services in other specified circumstances: Secondary | ICD-10-CM | POA: Diagnosis not present

## 2023-12-15 DIAGNOSIS — I2721 Secondary pulmonary arterial hypertension: Secondary | ICD-10-CM | POA: Diagnosis not present

## 2023-12-15 DIAGNOSIS — I5022 Chronic systolic (congestive) heart failure: Secondary | ICD-10-CM | POA: Diagnosis not present

## 2023-12-15 NOTE — Progress Notes (Signed)
 Pulmonary Individual Treatment Plan  Patient Details  Name: Tonya Odonnell MRN: [id number] Date of Birth: [birth year] Referring Provider:   Flowsheet Row Pulmonary Rehab from 12/15/2023 in [facility] Cardiac and Pulmonary Rehab  Referring Provider Ezra Shuck    Initial Encounter Date:  Flowsheet Row Pulmonary Rehab from 12/15/2023 in [facility] Cardiac and Pulmonary Rehab  Date 12/15/23    Visit Diagnosis: Pulmonary artery hypertension (HCC)  Patient's Home Medications on Admission:  Current Outpatient Medications:    ambrisentan  (LETAIRIS ) 10 MG tablet, Take 1 tablet (10 mg total) by mouth daily., Disp: 30 tablet, Rfl: 11   Azelaic Acid  15 % gel, Apply to face every morning, Disp: 50 g, Rfl: 5   bismuth subsalicylate (PEPTO BISMOL) 262 MG/15ML suspension, Take 30 mLs by mouth every 6 (six) hours as needed for diarrhea or loose stools., Disp: , Rfl:    clobetasol  cream (TEMOVATE ) 0.05 %, Apply 1 Application topically as needed., Disp: 30 g, Rfl: 4   cromolyn  (OPTICROM ) 4 % ophthalmic solution, Place 1 drop into both eyes 4 (four) times daily., Disp: 10 mL, Rfl: 4   ELIDEL  1 % cream, Apply to red scaly areas on the face BID for seb derm, Disp: 30 g, Rfl: 4   fluticasone  (FLONASE ) 50 MCG/ACT nasal spray, Place 2 sprays into both nostrils daily as needed for allergies or rhinitis., Disp: , Rfl:    furosemide  (LASIX ) 40 MG tablet, Take 1 tablet (40 mg total) by mouth daily., Disp: 90 tablet, Rfl: 1   hydrALAZINE  (APRESOLINE ) 25 MG tablet, Take 1 tablet (25 mg total) by mouth 2 (two) times daily., Disp: 180 tablet, Rfl: 1   Ivermectin  (SOOLANTRA ) 1 % CREA, Apply 1 Application topically at bedtime., Disp: 45 g, Rfl: 5   levothyroxine  (SYNTHROID ) 112 MCG tablet, Take 1 pill everyday except Sunday take half a pill, Disp: 90 tablet, Rfl: 3   meclizine  (ANTIVERT ) 25 MG tablet, Take 1 tablet (25 mg total) by mouth 3 (three) times daily as needed for dizziness., Disp: 30 tablet, Rfl: 0   meloxicam  (MOBIC ) 15 MG  tablet, Take 1 tablet (15 mg total) by mouth daily., Disp: 21 tablet, Rfl: 0   omeprazole  (PRILOSEC) 40 MG capsule, Take 40 mg by mouth daily as needed (Heartburn)., Disp: , Rfl:    ondansetron  (ZOFRAN ) 4 MG tablet, Take 1 tablet (4 mg total) by mouth every 8 (eight) hours as needed for nausea or vomiting., Disp: 15 tablet, Rfl: 0   potassium chloride  SA (KLOR-CON  M) 20 MEQ tablet, Take 1 tablet (20 mEq total) by mouth daily., Disp: 90 tablet, Rfl: 1   Selexipag  800 MCG TABS, Take 1 tablet (800 mcg total) by mouth 2 (two) times daily., Disp: 60 tablet, Rfl: 11   sotatercept (WINREVAIR ) subcutaneous injection, Inject 0.5 mL (0.3 mg/kg) subcutaneously for the first dose, then inject 1.1 mL (0.7 mg/kg) subcutaneously every 3 weeks. Dosing weight 80.9 kg., Disp: , Rfl:    tadalafil , PAH, (ADCIRCA ) 20 MG tablet, Take 2 tablets (40 mg total) by mouth daily. NDC's covered by plan: [id number], [id number], [id number], Disp: , Rfl:    valACYclovir  (VALTREX ) 1000 MG tablet, Take 1,000 mg by mouth daily as needed (Cold Sores)., Disp: , Rfl:   Past Medical History: Past Medical History:  Diagnosis Date   Anemia    USE TO HAVE IT,NOT NOW,2015,2016   Arthritis    HIPS,KNEES,HANDS   Breast cancer in female [facility])    Cancer (HCC)    LEFT   Hyperlipidemia  Hypertension    Low iron    Neuromuscular disorder (HCC)    MUSCLE PAIN,FIBROMYALGIA   Osteopenia    Pulmonary HTN (HCC)    PVC (premature ventricular contraction)    Thyroid  disease     Tobacco Use: Social History   Tobacco Use  Smoking Status Never  Smokeless Tobacco Never    Labs: Review Flowsheet  More data exists      Latest Ref Rng & Units 12/12/2022 12/22/2022 07/02/2023 08/19/2023 08/28/2023  Labs for ITP Cardiac and Pulmonary Rehab  Cholestrol 100 - 199 mg/dL - - - 723  -  LDL (calc) 0 - 99 mg/dL - - - 814  -  HDL-C >60 mg/dL - - - 73  -  Trlycerides 0 - 149 mg/dL - - - 892  -  Hemoglobin A1c 4.8 - 5.6 % - 5.7  - 5.2  -   PH, Arterial 7.35 - 7.45 7.399  - - - -  PCO2 arterial 32 - 48 mmHg 35.2  - - - -  Bicarbonate 20.0 - 28.0 mmol/L 21.8  24.0  - 23.9  23.5  - -  TCO2 22 - 32 mmol/L 23  25  - 25  25  - 22   Acid-base deficit 0.0 - 2.0 mmol/L 2.0  1.0  - 1.0  2.0  - -  O2 Saturation % 94  66  - 71  71  - -    Details       Multiple values from one day are sorted in reverse-chronological order          Pulmonary Assessment Scores:  Pulmonary Assessment Scores     Row Name 07/22/23 1551 12/15/23 1506       ADL UCSD   ADL Phase -- Entry    SOB Score total -- 95    Rest -- 4    Walk -- 4    Stairs -- 4    Bath -- 4    Dress -- 3    Shop -- 4      CAT Score   CAT Score -- 28      mMRC Score   mMRC Score 4 4       UCSD: Self-administered rating of dyspnea associated with activities of daily living (ADLs) 6-point scale (0 = not at all to 5 = maximal or unable to do because of breathlessness)  Scoring Scores range from 0 to 120.  Minimally important difference is 5 units  CAT: CAT can identify the health impairment of COPD patients and is better correlated with disease progression.  CAT has a scoring range of zero to 40. The CAT score is classified into four groups of low (less than 10), medium (10 - 20), high (21-30) and very high (31-40) based on the impact level of disease on health status. A CAT score over 10 suggests significant symptoms.  A worsening CAT score could be explained by an exacerbation, poor medication adherence, poor inhaler technique, or progression of COPD or comorbid conditions.  CAT MCID is 2 points  mMRC: mMRC (Modified Medical Research Council) Dyspnea Scale is used to assess the degree of baseline functional disability in patients of respiratory disease due to dyspnea. No minimal important difference is established. A decrease in score of 1 point or greater is considered a positive change.   Pulmonary Function Assessment:  Pulmonary Function Assessment -  07/20/23 1335       Breath   Shortness of Breath Yes;Limiting activity  Exercise Target Goals: Exercise Program Goal: Individual exercise prescription set using results from initial 6 min walk test and THRR while considering  patient's activity barriers and safety.   Exercise Prescription Goal: Initial exercise prescription builds to 30-45 minutes a day of aerobic activity, 2-3 days per week.  Home exercise guidelines will be given to patient during program as part of exercise prescription that the participant will acknowledge.  Education: Aerobic Exercise: - Group verbal and visual presentation on the components of exercise prescription. Introduces F.I.T.T principle from ACSM for exercise prescriptions.  Reviews F.I.T.T. principles of aerobic exercise including progression. Written material provided at class time.   Education: Resistance Exercise: - Group verbal and visual presentation on the components of exercise prescription. Introduces F.I.T.T principle from ACSM for exercise prescriptions  Reviews F.I.T.T. principles of resistance exercise including progression. Written material provided at class time.    Education: Exercise & Equipment Safety: - Individual verbal instruction and demonstration of equipment use and safety with use of the equipment. Flowsheet Row Pulmonary Rehab from 12/15/2023 in [facility] Cardiac and Pulmonary Rehab  Date 12/15/23  Educator [facility]  Instruction Review Code 1- Verbalizes Understanding    Education: Exercise Physiology & General Exercise Guidelines: - Group verbal and written instruction with models to review the exercise physiology of the cardiovascular system and associated critical values. Provides general exercise guidelines with specific guidelines to those with heart or lung disease.    Education: Flexibility, Balance, Mind/Body Relaxation: - Group verbal and visual presentation with interactive activity on the components of exercise  prescription. Introduces F.I.T.T principle from ACSM for exercise prescriptions. Reviews F.I.T.T. principles of flexibility and balance exercise training including progression. Also discusses the mind body connection.  Reviews various relaxation techniques to help reduce and manage stress (i.e. Deep breathing, progressive muscle relaxation, and visualization). Balance handout provided to take home. Written material provided at class time.   Activity Barriers & Risk Stratification:  Activity Barriers & Cardiac Risk Stratification - 12/15/23 1459       Activity Barriers & Cardiac Risk Stratification   Activity Barriers Back Problems;Fibromyalgia;Other (comment)    Comments Vertigo          6 Minute Walk:  6 Minute Walk     Row Name 07/22/23 1541 12/15/23 1456       6 Minute Walk   Phase Initial Initial    Distance 1225 feet 1220 feet    Walk Time 6 minutes 6 minutes    # of Rest Breaks 0 0    MPH 2.32 2.3    METS 3.28 3.34    RPE 11 7    Perceived Dyspnea  0 1    VO2 Peak 11.48 11.7    Symptoms Yes (comment) No    Comments Nerve pain (same pain as resting) --    Resting HR 92 bpm 97 bpm    Resting BP 120/84 134/80    Resting Oxygen Saturation  90 % 93 %    Exercise Oxygen Saturation  during 6 min walk 90 % 92 %    Max Ex. HR 109 bpm 111 bpm    Max Ex. BP 132/88 132/82    2 Minute Post BP 126/88 124/80      Interval HR   1 Minute HR 100 97    2 Minute HR 107 115    3 Minute HR 109 101    4 Minute HR 108 104    5 Minute HR 108 111    6  Minute HR 108 109    2 Minute Post HR 89 100    Interval Heart Rate? Yes Yes      Interval Oxygen   Interval Oxygen? Yes Yes    Baseline Oxygen Saturation % 90 % 93 %    1 Minute Oxygen Saturation % 90 % 93 %    1 Minute Liters of Oxygen 0 L 0 L    2 Minute Oxygen Saturation % 90 % 92 %    2 Minute Liters of Oxygen 0 L 0 L    3 Minute Oxygen Saturation % 91 % 94 %    3 Minute Liters of Oxygen 0 L 0 L    4 Minute Oxygen Saturation  % 91 % 96 %    4 Minute Liters of Oxygen 0 L 0 L    5 Minute Oxygen Saturation % 92 % 93 %    5 Minute Liters of Oxygen 0 L 0 L    6 Minute Oxygen Saturation % 94 % 95 %    6 Minute Liters of Oxygen 0 L 0 L    2 Minute Post Oxygen Saturation % 94 % 96 %    2 Minute Post Liters of Oxygen 0 L 0 L      Oxygen Initial Assessment:  Oxygen Initial Assessment - 12/10/23 1414       Home Oxygen   Home Oxygen Device None    Sleep Oxygen Prescription None    Home Exercise Oxygen Prescription None    Home Resting Oxygen Prescription None      Intervention   Short Term Goals To learn and understand importance of maintaining oxygen saturations>88%;To learn and understand importance of monitoring SPO2 with pulse oximeter and demonstrate accurate use of the pulse oximeter.;To learn and demonstrate proper use of respiratory medications;To learn and demonstrate proper pursed lip breathing techniques or other breathing techniques.     Long  Term Goals Exhibits proper breathing techniques, such as pursed lip breathing or other method taught during program session;Maintenance of O2 saturations>88%;Verbalizes importance of monitoring SPO2 with pulse oximeter and return demonstration;Demonstrates proper use of MDI's;Compliance with respiratory medication          Oxygen Re-Evaluation:  Oxygen Re-Evaluation     Row Name 07/28/23 1117             Program Oxygen Prescription   Program Oxygen Prescription None         Home Oxygen   Home Oxygen Device None       Sleep Oxygen Prescription None       Home Exercise Oxygen Prescription None       Home Resting Oxygen Prescription None       Compliance with Home Oxygen Use Yes         Goals/Expected Outcomes   Short Term Goals To learn and demonstrate proper pursed lip breathing techniques or other breathing techniques.        Long  Term Goals Exhibits proper breathing techniques, such as pursed lip breathing or other method taught during program  session       Comments Reviewed PLB technique with pt.  Talked about how it works and it's importance in maintaining their exercise saturations.       Goals/Expected Outcomes Short: Become more profiecient at using PLB. Long: Become independent at using PLB.          Oxygen Discharge (Final Oxygen Re-Evaluation):  Oxygen Re-Evaluation - 07/28/23 1117  Program Oxygen Prescription   Program Oxygen Prescription None      Home Oxygen   Home Oxygen Device None    Sleep Oxygen Prescription None    Home Exercise Oxygen Prescription None    Home Resting Oxygen Prescription None    Compliance with Home Oxygen Use Yes      Goals/Expected Outcomes   Short Term Goals To learn and demonstrate proper pursed lip breathing techniques or other breathing techniques.     Long  Term Goals Exhibits proper breathing techniques, such as pursed lip breathing or other method taught during program session    Comments Reviewed PLB technique with pt.  Talked about how it works and it's importance in maintaining their exercise saturations.    Goals/Expected Outcomes Short: Become more profiecient at using PLB. Long: Become independent at using PLB.          Initial Exercise Prescription:  Initial Exercise Prescription - 12/15/23 1400       Date of Initial Exercise RX and Referring Provider   Date 12/15/23    Referring Provider Ezra Shuck      Oxygen   Maintain Oxygen Saturation 88% or higher      Recumbant Bike   Level 3    RPM 50    Watts 25    Minutes 15    METs 3.3      NuStep   Level 3    SPM 80    Minutes 15    METs 3.3      Arm Ergometer   Level 3    Watts 25    RPM 50    Minutes 15    METs 3.3      REL-XR   Level 3    Watts 25    Speed 50    Minutes 15    METs 3.3      Prescription Details   Duration Progress to 30 minutes of continuous aerobic without signs/symptoms of physical distress      Intensity   THRR 40-80% of Max Heartrate 123-149    Ratings of  Perceived Exertion 11-13    Perceived Dyspnea 0-4      Progression   Progression Continue to progress workloads to maintain intensity without signs/symptoms of physical distress.      Resistance Training   Training Prescription Yes    Weight 3lb    Reps 10-15          Perform Capillary Blood Glucose checks as needed.  Exercise Prescription Changes:   Exercise Prescription Changes     Row Name 07/22/23 1500 08/06/23 1700 08/20/23 1700 09/02/23 1500 12/15/23 1500     Response to Exercise   Blood Pressure (Admit) 120/84 114/74 116/70 114/62 134/80   Blood Pressure (Exercise) 132/88 134/78 122/76 -- 132/82   Blood Pressure (Exit) 126/88 108/64 108/70 116/66 124/80   Heart Rate (Admit) 92 bpm 97 bpm 102 bpm 97 bpm 97 bpm   Heart Rate (Exercise) 109 bpm 108 bpm 127 bpm 135 bpm 115 bpm   Heart Rate (Exit) 89 bpm 94 bpm 124 bpm 100 bpm 100 bpm   Oxygen Saturation (Admit) 90 % 95 % 95 % 91 % 93 %   Oxygen Saturation (Exercise) 90 % 90 % 90 % 90 % 92 %   Oxygen Saturation (Exit) 94 % 93 % 89 % 91 % 96 %   Rating of Perceived Exertion (Exercise) 11 15 15 15 7    Perceived Dyspnea (Exercise) 0 --  1 -- 1   Symptoms Nerve pain (same pain as resting) none nonw none none   Comments results First 2 exercise sessions -- -- results   Duration Progress to 30 minutes of  aerobic without signs/symptoms of physical distress Progress to 30 minutes of  aerobic without signs/symptoms of physical distress Continue with 30 min of aerobic exercise without signs/symptoms of physical distress. Continue with 30 min of aerobic exercise without signs/symptoms of physical distress. --   Intensity THRR New THRR unchanged THRR unchanged THRR unchanged --     Progression   Progression Continue to progress workloads to maintain intensity without signs/symptoms of physical distress. Continue to progress workloads to maintain intensity without signs/symptoms of physical distress. Continue to progress  workloads to maintain intensity without signs/symptoms of physical distress. Continue to progress workloads to maintain intensity without signs/symptoms of physical distress. --   Average METs 3.28 2.47 2.77 1.82 --     Resistance Training   Training Prescription -- Yes Yes Yes --   Weight -- 2 lb 2 lb 2 lb --   Reps -- 10-15 10-15 10-15 --     Interval Training   Interval Training -- No No No --     Treadmill   MPH -- -- -- 1 --   Grade -- -- -- 0 --   Minutes -- -- -- 15 --   METs -- -- -- 1.77 --     Recumbant Bike   Level -- 2 2 1  --   Watts -- 16 25 18  --   Minutes -- 15 15 15  --   METs -- 2.61 2.95 2.7 --     NuStep   Level -- 2 2 6  --   Minutes -- 15 15 15  --   METs -- 2.3 2 0.9 --     T5 Nustep   Level -- 1 1 2  --   Minutes -- 15 15 15  --   METs -- 2 1.9 1.9 --     Biostep-RELP   Level -- -- 2 -- --   Minutes -- -- 15 -- --   METs -- -- 5 -- --     Track   Laps -- -- 15 -- --   Minutes -- -- 15 -- --   METs -- -- 1.82 -- --     Oxygen   Maintain Oxygen Saturation -- 88% or higher 88% or higher 88% or higher 88% or higher      Exercise Comments:   Exercise Comments     Row Name 07/28/23 1117           Exercise Comments First full day of exercise!  Patient was oriented to gym and equipment including functions, settings, policies, and procedures.  Patient's individual exercise prescription and treatment plan were reviewed.  All starting workloads were established based on the results of the 6 minute walk test done at initial orientation visit.  The plan for exercise progression was also introduced and progression will be customized based on patient's performance and goals.          Exercise Goals and Review:   Exercise Goals     Row Name 07/22/23 1550 12/15/23 1505           Exercise Goals   Increase Physical Activity Yes Yes      Intervention Provide advice, education, support and counseling about physical activity/exercise needs.;Develop  an individualized exercise prescription for aerobic and resistive training based on initial  evaluation findings, risk stratification, comorbidities and participant's personal goals. Provide advice, education, support and counseling about physical activity/exercise needs.;Develop an individualized exercise prescription for aerobic and resistive training based on initial evaluation findings, risk stratification, comorbidities and participant's personal goals.      Expected Outcomes Short Term: Attend rehab on a regular basis to increase amount of physical activity.;Long Term: Add in home exercise to make exercise part of routine and to increase amount of physical activity.;Long Term: Exercising regularly at least 3-5 days a week. Short Term: Attend rehab on a regular basis to increase amount of physical activity.;Long Term: Add in home exercise to make exercise part of routine and to increase amount of physical activity.;Long Term: Exercising regularly at least 3-5 days a week.      Increase Strength and Stamina Yes Yes      Intervention Provide advice, education, support and counseling about physical activity/exercise needs.;Develop an individualized exercise prescription for aerobic and resistive training based on initial evaluation findings, risk stratification, comorbidities and participant's personal goals. Provide advice, education, support and counseling about physical activity/exercise needs.;Develop an individualized exercise prescription for aerobic and resistive training based on initial evaluation findings, risk stratification, comorbidities and participant's personal goals.      Expected Outcomes Short Term: Increase workloads from initial exercise prescription for resistance, speed, and METs.;Short Term: Perform resistance training exercises routinely during rehab and add in resistance training at home;Long Term: Improve cardiorespiratory fitness, muscular endurance and strength as measured by  increased METs and functional capacity ( ) Short Term: Increase workloads from initial exercise prescription for resistance, speed, and METs.;Short Term: Perform resistance training exercises routinely during rehab and add in resistance training at home;Long Term: Improve cardiorespiratory fitness, muscular endurance and strength as measured by increased METs and functional capacity ( )      Able to understand and use rate of perceived exertion (RPE) scale Yes Yes      Intervention Provide education and explanation on how to use RPE scale Provide education and explanation on how to use RPE scale      Expected Outcomes Short Term: Able to use RPE daily in rehab to express subjective intensity level;Long Term:  Able to use RPE to guide intensity level when exercising independently Short Term: Able to use RPE daily in rehab to express subjective intensity level;Long Term:  Able to use RPE to guide intensity level when exercising independently      Able to understand and use Dyspnea scale Yes Yes      Intervention Provide education and explanation on how to use Dyspnea scale Provide education and explanation on how to use Dyspnea scale      Expected Outcomes Short Term: Able to use Dyspnea scale daily in rehab to express subjective sense of shortness of breath during exertion;Long Term: Able to use Dyspnea scale to guide intensity level when exercising independently Short Term: Able to use Dyspnea scale daily in rehab to express subjective sense of shortness of breath during exertion;Long Term: Able to use Dyspnea scale to guide intensity level when exercising independently      Knowledge and understanding of Target Heart Rate Range (THRR) Yes Yes      Intervention Provide education and explanation of THRR including how the numbers were predicted and where they are located for reference Provide education and explanation of THRR including how the numbers were predicted and where they are located for  reference      Expected Outcomes Short Term: Able to state/look up THRR;Short Term:  Able to use daily as guideline for intensity in rehab;Long Term: Able to use THRR to govern intensity when exercising independently Short Term: Able to state/look up THRR;Short Term: Able to use daily as guideline for intensity in rehab;Long Term: Able to use THRR to govern intensity when exercising independently      Able to check pulse independently Yes Yes      Intervention Provide education and demonstration on how to check pulse in carotid and radial arteries.;Review the importance of being able to check your own pulse for safety during independent exercise Provide education and demonstration on how to check pulse in carotid and radial arteries.;Review the importance of being able to check your own pulse for safety during independent exercise      Expected Outcomes Short Term: Able to explain why pulse checking is important during independent exercise;Long Term: Able to check pulse independently and accurately Short Term: Able to explain why pulse checking is important during independent exercise;Long Term: Able to check pulse independently and accurately      Understanding of Exercise Prescription Yes Yes      Intervention Provide education, explanation, and written materials on patient's individual exercise prescription Provide education, explanation, and written materials on patient's individual exercise prescription      Expected Outcomes Short Term: Able to explain program exercise prescription;Long Term: Able to explain home exercise prescription to exercise independently Short Term: Able to explain program exercise prescription;Long Term: Able to explain home exercise prescription to exercise independently         Exercise Goals Re-Evaluation :  Exercise Goals Re-Evaluation     Row Name 07/28/23 1117 08/06/23 1747 08/20/23 1756 09/02/23 1546 09/14/23 1619     Exercise Goal Re-Evaluation   Exercise Goals  Review Able to understand and use rate of perceived exertion (RPE) scale;Able to understand and use Dyspnea scale;Knowledge and understanding of Target Heart Rate Range (THRR);Understanding of Exercise Prescription Increase Physical Activity;Understanding of Exercise Prescription;Increase Strength and Stamina Increase Physical Activity;Understanding of Exercise Prescription;Increase Strength and Stamina Increase Physical Activity;Understanding of Exercise Prescription;Increase Strength and Stamina Increase Physical Activity;Understanding of Exercise Prescription;Increase Strength and Stamina   Comments Reviewed RPE and dyspnea scale, THR and program prescription with pt today.  Pt voiced understanding and was given a copy of goals to take home. Kavina is off to a good start in the program and tolerated her exercise prescription well. She was able to do level 2 on the recumbent bike and T4 nustep. She also was able to do level 1 on the T5 nustep. We will continue to monitor her progress in the program. Cynia is off to a good start in the program. She walked 15 laps on the track. She was able to maintain level 2 on the recumbent bike and increased to 25 watts. She also maintained level 2 on the biostep and T4 nustep. She also maintained level 1 on the T5 nustep. We will continue to monitor her progress in the program. Verenice is doing well in rehab. She increased to level 6 on the T4 nustep and level 2 on the T5 nustep. She also tried the treadmill at a speed of 1 mph with no incline. We will continue to monitor her progress in the program. Shineka has not attended rehab since 6/19. We will continue to stay in contact with her to determine her status in the program.   Expected Outcomes Short: Use RPE daily to regulate intensity. Long: Follow program prescription in [facility]. Short: Continue to follow  current exercise prescription. Long: Continue exercise to improve strength and stamina. Short: Continue to progressively increase  laps on the track and workload on the recumbent bike. Long: Continue exercise to improve strength and stamina. Short: Continue to progressively increase workload on the nusteps. Long: Continue exercise to improve strength and stamina. Short: Return to rehab. Long: Continue exercise to improve strength and stamina.      Discharge Exercise Prescription (Final Exercise Prescription Changes):  Exercise Prescription Changes - 12/15/23 1500       Response to Exercise   Blood Pressure (Admit) 134/80    Blood Pressure (Exercise) 132/82    Blood Pressure (Exit) 124/80    Heart Rate (Admit) 97 bpm    Heart Rate (Exercise) 115 bpm    Heart Rate (Exit) 100 bpm    Oxygen Saturation (Admit) 93 %    Oxygen Saturation (Exercise) 92 %    Oxygen Saturation (Exit) 96 %    Rating of Perceived Exertion (Exercise) 7    Perceived Dyspnea (Exercise) 1    Symptoms none    Comments results      Oxygen   Maintain Oxygen Saturation 88% or higher          Nutrition:  Target Goals: Understanding of nutrition guidelines, daily intake of sodium 1500mg , cholesterol 200mg , calories 30% from fat and 7% or less from saturated fats, daily to have 5 or more servings of fruits and vegetables.  Education: Nutrition 1 -Group instruction provided by verbal, written material, interactive activities, discussions, models, and posters to present general guidelines for heart healthy nutrition including macronutrients, label reading, and promoting whole foods over processed counterparts. Education serves as Pensions consultant of discussion of heart healthy eating for all. Written material provided at class time.     Education: Nutrition 2 -Group instruction provided by verbal, written material, interactive activities, discussions, models, and posters to present general guidelines for heart healthy nutrition including sodium, cholesterol, and saturated fat. Providing guidance of habit forming to improve blood pressure,  cholesterol, and body weight. Written material provided at class time.     Biometrics:  Pre Biometrics - 12/15/23 1505       Pre Biometrics   Height 5' 3.5 (1.613 m)    Weight 176 lb 3.2 oz (79.9 kg)    Waist Circumference 38 inches    Hip Circumference 42 inches    Waist to Hip Ratio 0.9 %    BMI (Calculated) 30.72    Single Leg Stand 29.75 seconds           Nutrition Therapy Plan and Nutrition Goals:  Nutrition Therapy & Goals - 07/28/23 1116       Nutrition Therapy   Diet liberaliazed due to poor appetite    Protein (specify units) >75g    Fiber 25 grams    Whole Grain Foods 3 servings    Saturated Fats 15 max. grams    Fruits and Vegetables 5 servings/day    Sodium 2 grams      Personal Nutrition Goals   Nutrition Goal Eat 3 times per day, small frequent meals or nutrient dense snacks    Personal Goal #2 Eat 15-30gProtein and 30-60gCarbs at each meal.    Comments Patient on selexipag  and reports it has ruined her appetite and when she does eat she feels bloated quickly experiences diarrhea. She sometimes gets used to the medication but then the does is increased and then she struggles again. Spoke with her today about prioritizing quality over quantity.  To make sure the little food she can eat and tolerate helps her meet her nutritional goals. Provided handout with several small meal and snack ideas focusing on balance of complex carbs and protein. Educated on types of fats, sources and how to add more calories to less quantity of food.      Intervention Plan   Intervention Prescribe, educate and counsel regarding individualized specific dietary modifications aiming towards targeted core components such as weight, hypertension, lipid management, diabetes, heart failure and other comorbidities.;Nutrition handout(s) given to patient.    Expected Outcomes Short Term Goal: Understand basic principles of dietary content, such as calories, fat, sodium, cholesterol and  nutrients.;Short Term Goal: A plan has been developed with personal nutrition goals set during dietitian appointment.;Long Term Goal: Adherence to prescribed nutrition plan.          Nutrition Assessments:  MEDIFICTS Score Key: >=70 Need to make dietary changes  40-70 Heart Healthy Diet <= 40 Therapeutic Level Cholesterol Diet   Picture Your Plate Scores: <59 Unhealthy dietary pattern with much room for improvement. 41-50 Dietary pattern unlikely to meet recommendations for good health and room for improvement. 51-60 More healthful dietary pattern, with some room for improvement.  >60 Healthy dietary pattern, although there may be some specific behaviors that could be improved.   Nutrition Goals Re-Evaluation:   Nutrition Goals Discharge (Final Nutrition Goals Re-Evaluation):   Psychosocial: Target Goals: Acknowledge presence or absence of significant depression and/or stress, maximize coping skills, provide positive support system. Participant is able to verbalize types and ability to use techniques and skills needed for reducing stress and depression.   Education: Stress, Anxiety, and Depression - Group verbal and visual presentation to define topics covered.  Reviews how body is impacted by stress, anxiety, and depression.  Also discusses healthy ways to reduce stress and to treat/manage anxiety and depression.  Written material provided at class time.   Education: Sleep Hygiene -Provides group verbal and written instruction about how sleep can affect your health.  Define sleep hygiene, discuss sleep cycles and impact of sleep habits. Review good sleep hygiene tips.    Initial Review & Psychosocial Screening:  Initial Psych Review & Screening - 12/10/23 1408       Initial Review   Current issues with None Identified      Family Dynamics   Good Support System? Yes   son, ex-husband, social worker   Comments Arabell states that she previously attended pulmonary rehab,  however she had to discharge early due to difficulty with veritgo. She reports that these symptoms are more well controlled at this time, however she should avoid the treadmill and walking in circles. Mandy states that she is seeing a spine specialist due to chronic back issues and that they are talking about physical therapy to improve pain and mobility, however this has not been set up at this time. She denies any mental health concerns and states that she has a good support system, consisting of her son and ex-husband. Alleyah does want to take advantage of the dietician services to help balance her diet.      Barriers   Psychosocial barriers to participate in program There are no identifiable barriers or psychosocial needs.;The patient should benefit from training in stress management and relaxation.      Screening Interventions   Interventions Encouraged to exercise;To provide support and resources with identified psychosocial needs;Provide feedback about the scores to participant    Expected Outcomes Short Term goal: Utilizing psychosocial counselor,  staff and physician to assist with identification of specific Stressors or current issues interfering with healing process. Setting desired goal for each stressor or current issue identified.;Long Term Goal: Stressors or current issues are controlled or eliminated.;Short Term goal: Identification and review with participant of any Quality of Life or Depression concerns found by scoring the questionnaire.;Long Term goal: The participant improves quality of Life and PHQ9 Scores as seen by post scores and/or verbalization of changes          Quality of Life Scores:  Scores of 19 and below usually indicate a poorer quality of life in these areas.  A difference of  2-3 points is a clinically meaningful difference.  A difference of 2-3 points in the total score of the Quality of Life Index has been associated with significant improvement in overall quality of  life, self-image, physical symptoms, and general health in studies assessing change in quality of life.  PHQ-9: Review Flowsheet  More data exists      12/15/2023 07/22/2023 12/10/2022 06/17/2021 06/13/2021  Depression screen PHQ 2/9  Decreased Interest 0 0 1 1 0 0  Down, Depressed, Hopeless 0 0 1 1 0 0  PHQ - 2 Score 0 0 2 2 0 0  Altered sleeping 0 0 3 3 2  -  Tired, decreased energy 3 3 3 3 3  -  Change in appetite 2 2 1 2  0 -  Feeling bad or failure about yourself  0 0 -- 0 1 -  Trouble concentrating 1 0 0 0 1 -  Moving slowly or fidgety/restless 0 0 0 1 0 -  Suicidal thoughts 0 0 -- 0 0 -  PHQ-9 Score 6 5 9 11 7  -  Difficult doing work/chores - - Very difficult Very difficult - -    Details       Multiple values from one day are sorted in reverse-chronological order        Interpretation of Total Score  Total Score Depression Severity:  1-4 = Minimal depression, 5-9 = Mild depression, 10-14 = Moderate depression, 15-19 = Moderately severe depression, 20-27 = Severe depression   Psychosocial Evaluation and Intervention:  Psychosocial Evaluation - 12/10/23 1412       Psychosocial Evaluation & Interventions   Interventions Encouraged to exercise with the program and follow exercise prescription;Relaxation education;Stress management education    Comments Ting states that she previously attended pulmonary rehab, however she had to discharge early due to difficulty with veritgo. She reports that these symptoms are more well controlled at this time, however she should avoid the treadmill and walking in circles. Jeanae states that she is seeing a spine specialist due to chronic back issues and that they are talking about physical therapy to improve pain and mobility, however this has not been set up at this time. She denies any mental health concerns and states that she has a good support system, consisting of her son and ex-husband. Billye does want to take advantage of the dietician  services to help balance her diet.    Expected Outcomes Short: Attend pulmonary rehab for education and exercise. Long: Develop and maintain positive self care habits.    Continue Psychosocial Services  Follow up required by staff          Psychosocial Re-Evaluation:   Psychosocial Discharge (Final Psychosocial Re-Evaluation):   Education: Education Goals: Education classes will be provided on a weekly basis, covering required topics. Participant will state understanding/return demonstration of topics presented.  Learning Barriers/Preferences:  Learning Barriers/Preferences - 12/10/23 1407       Learning Barriers/Preferences   Learning Barriers None    Learning Preferences None          General Pulmonary Education Topics:  Infection Prevention: - Provides verbal and written material to individual with discussion of infection control including proper hand washing and proper equipment cleaning during exercise session. Flowsheet Row Pulmonary Rehab from 12/15/2023 in [facility]  Date 12/15/23  Educator [facility]  Instruction Review Code 1- Verbalizes Understanding    Falls Prevention: - Provides verbal and written material to individual with discussion of falls prevention and safety. Flowsheet Row Pulmonary Rehab from 12/15/2023 in [facility]  Date 12/15/23  Educator [facility]  Instruction Review Code 1- Verbalizes Understanding    Chronic Lung Disease Review: - Group verbal instruction with posters, models, PowerPoint presentations and videos,  to review new updates, new respiratory medications, new advancements in procedures and treatments. Providing information on websites and 800 numbers for continued self-education. Includes information about supplement oxygen, available portable oxygen systems, continuous and intermittent flow rates, oxygen safety, concentrators, and Medicare reimbursement for oxygen. Explanation of Pulmonary Drugs,  including class, frequency, complications, importance of spacers, rinsing mouth after steroid MDI's, and proper cleaning methods for nebulizers. Review of basic lung anatomy and physiology related to function, structure, and complications of lung disease. Review of risk factors. Discussion about methods for diagnosing sleep apnea and types of masks and machines for OSA. Includes a review of the use of types of environmental controls: home humidity, furnaces, filters, dust mite/pet prevention, HEPA vacuums. Discussion about weather changes, air quality and the benefits of nasal washing. Instruction on Warning signs, infection symptoms, calling MD promptly, preventive modes, and value of vaccinations. Review of effective airway clearance, coughing and/or vibration techniques. Emphasizing that all should Create an Action Plan. Written material provided at class time.   AED/CPR: - Group verbal and written instruction with the use of models to demonstrate the basic use of the AED with the basic ABC's of resuscitation.    Tests and Procedures:  - Group verbal and visual presentation and models provide information about basic cardiac anatomy and function. Reviews the testing methods done to diagnose heart disease and the outcomes of the test results. Describes the treatment choices: Medical Management, Angioplasty, or Coronary Bypass Surgery for treating various heart conditions including Myocardial Infarction, Angina, Valve Disease, and Cardiac Arrhythmias.  Written material provided at class time.   Medication Safety: - Group verbal and visual instruction to review commonly prescribed medications for heart and lung disease. Reviews the medication, class of the drug, and side effects. Includes the steps to properly store meds and maintain the prescription regimen.  Written material given at graduation.   Other: -Provides group and verbal instruction on various topics (see comments)   Knowledge  Questionnaire Score:    Core Components/Risk Factors/Patient Goals at Admission:  Personal Goals and Risk Factors at Admission - 12/10/23 1406       Core Components/Risk Factors/Patient Goals on Admission    Weight Management Yes;Weight Loss    Intervention Weight Management: Develop a combined nutrition and exercise program designed to reach desired caloric intake, while maintaining appropriate intake of nutrient and fiber, sodium and fats, and appropriate energy expenditure required for the weight goal.;Weight Management: Provide education and appropriate resources to help participant work on and attain dietary goals.;Weight Management/Obesity: Establish reasonable short term and long term weight goals.;Obesity: Provide education and appropriate resources  to help participant work on and attain dietary goals.    Goal Weight: Short Term 155 lb (70.3 kg)    Expected Outcomes Short Term: Continue to assess and modify interventions until short term weight is achieved;Weight Loss: Understanding of general recommendations for a balanced deficit meal plan, which promotes 1-2 lb weight loss per week and includes a negative energy balance of [id number] kcal/d;Understanding recommendations for meals to include 15-35% energy as protein, 25-35% energy from fat, 35-60% energy from carbohydrates, less than 200mg  of dietary cholesterol, 20-35 gm of total fiber daily;Understanding of distribution of calorie intake throughout the day with the consumption of 4-5 meals/snacks;Long Term: Adherence to nutrition and physical activity/exercise program aimed toward attainment of established weight goal    Improve shortness of breath with ADL's Yes    Intervention Provide education, individualized exercise plan and daily activity instruction to help decrease symptoms of SOB with activities of daily living.    Expected Outcomes Short Term: Improve cardiorespiratory fitness to achieve a reduction of symptoms when performing  ADLs;Long Term: Be able to perform more ADLs without symptoms or delay the onset of symptoms    Hypertension Yes    Intervention Provide education on lifestyle modifcations including regular physical activity/exercise, weight management, moderate sodium restriction and increased consumption of fresh fruit, vegetables, and low fat dairy, alcohol moderation, and smoking cessation.;Monitor prescription use compliance.    Expected Outcomes Short Term: Continued assessment and intervention until BP is < 140/5mm HG in hypertensive participants. < 130/28mm HG in hypertensive participants with diabetes, heart failure or chronic kidney disease.;Long Term: Maintenance of blood pressure at goal levels.    Lipids Yes    Intervention Provide education and support for participant on nutrition & aerobic/resistive exercise along with prescribed medications to achieve LDL 70mg , HDL >40mg .    Expected Outcomes Long Term: Cholesterol controlled with medications as prescribed, with individualized exercise RX and with personalized nutrition plan. Value goals: LDL < 70mg , HDL > 40 mg.;Short Term: Participant states understanding of desired cholesterol values and is compliant with medications prescribed. Participant is following exercise prescription and nutrition guidelines.          Education:Diabetes - Individual verbal and written instruction to review signs/symptoms of diabetes, desired ranges of glucose level fasting, after meals and with exercise. Acknowledge that pre and post exercise glucose checks will be done for 3 sessions at entry of program.   Know Your Numbers and Heart Failure: - Group verbal and visual instruction to discuss disease risk factors for cardiac and pulmonary disease and treatment options.  Reviews associated critical values for Overweight/Obesity, Hypertension, Cholesterol, and Diabetes.  Discusses basics of heart failure: signs/symptoms and treatments.  Introduces Heart Failure Zone chart  for action plan for heart failure. Written material provided at class time.   Core Components/Risk Factors/Patient Goals Review:    Core Components/Risk Factors/Patient Goals at Discharge (Final Review):    ITP Comments:  ITP Comments     Row Name 07/20/23 1338 07/22/23 1541 07/28/23 1117 12/10/23 1350 12/15/23 1456   ITP Comments Virtual Visit completed. Patient informed on EP and RD appointment and 6 Minute walk test. Patient also informed of patient health questionnaires on My Chart. Patient Verbalizes understanding. Visit diagnosis can be found in [facility] 06/05/2023. Completed and gym orientation for respiratory care services. Initial ITP created and sent for review to Dr. Faud Aleskerov, Medical Director. First full day of exercise!  Patient was oriented to gym and equipment including functions, settings, policies, and procedures.  Patient's individual exercise prescription and treatment plan were reviewed.  All starting workloads were established based on the results of the 6 minute walk test done at initial orientation visit.  The plan for exercise progression was also introduced and progression will be customized based on patient's performance and goals. Initial phone call completed. Diagnosis can be found in [facility] 11/24/2023. EP Orientation scheduled for Tuesday, 12/15/2023. Completed and gym orientation for pulmonary rehab. Initial ITP created and sent for review to Dr. Fuad Aleskerov, Medical Director.      Comments: Initial ITP

## 2023-12-15 NOTE — Patient Instructions (Signed)
 Patient Instructions  Patient Details  Name: Tonya Odonnell MRN: [id number] Date of Birth: 1965/09/28 Referring Provider:  Rolan Ezra RAMAN, MD  Below are your personal goals for exercise, nutrition, and risk factors. Our goal is to help you stay on track towards obtaining and maintaining these goals. We will be discussing your progress on these goals with you throughout the program.  Initial Exercise Prescription:  Initial Exercise Prescription - 12/15/23 1400       Date of Initial Exercise RX and Referring Provider   Date 12/15/23    Referring Provider Ezra Rolan      Oxygen   Maintain Oxygen Saturation 88% or higher      Recumbant Bike   Level 3    RPM 50    Watts 25    Minutes 15    METs 3.3      NuStep   Level 3    SPM 80    Minutes 15    METs 3.3      Arm Ergometer   Level 3    Watts 25    RPM 50    Minutes 15    METs 3.3      REL-XR   Level 3    Watts 25    Speed 50    Minutes 15    METs 3.3      Prescription Details   Duration Progress to 30 minutes of continuous aerobic without signs/symptoms of physical distress      Intensity   THRR 40-80% of Max Heartrate 123-149    Ratings of Perceived Exertion 11-13    Perceived Dyspnea 0-4      Progression   Progression Continue to progress workloads to maintain intensity without signs/symptoms of physical distress.      Resistance Training   Training Prescription Yes    Weight 3lb    Reps 10-15          Exercise Goals: Frequency: Be able to perform aerobic exercise two to three times per week in program working toward 2-5 days per week of home exercise.  Intensity: Work with a perceived exertion of 11 (fairly light) - 15 (hard) while following your exercise prescription.  We will make changes to your prescription with you as you progress through the program.   Duration: Be able to do 30 to 45 minutes of continuous aerobic exercise in addition to a 5 minute warm-up and a 5 minute cool-down  routine.   Nutrition Goals: Your personal nutrition goals will be established when you do your nutrition analysis with the dietician.  The following are general nutrition guidelines to follow: Cholesterol < 200mg /day Sodium < 1500mg /day Fiber: Women over 50 yrs - 21 grams per day  Personal Goals:  Personal Goals and Risk Factors at Admission - 12/10/23 1406       Core Components/Risk Factors/Patient Goals on Admission    Weight Management Yes;Weight Loss    Intervention Weight Management: Develop a combined nutrition and exercise program designed to reach desired caloric intake, while maintaining appropriate intake of nutrient and fiber, sodium and fats, and appropriate energy expenditure required for the weight goal.;Weight Management: Provide education and appropriate resources to help participant work on and attain dietary goals.;Weight Management/Obesity: Establish reasonable short term and long term weight goals.;Obesity: Provide education and appropriate resources to help participant work on and attain dietary goals.    Goal Weight: Short Term 155 lb (70.3 kg)    Expected Outcomes Short Term: Continue to assess and  modify interventions until short term weight is achieved;Weight Loss: Understanding of general recommendations for a balanced deficit meal plan, which promotes 1-2 lb weight loss per week and includes a negative energy balance of [phone] kcal/d;Understanding recommendations for meals to include 15-35% energy as protein, 25-35% energy from fat, 35-60% energy from carbohydrates, less than 200mg  of dietary cholesterol, 20-35 gm of total fiber daily;Understanding of distribution of calorie intake throughout the day with the consumption of 4-5 meals/snacks;Long Term: Adherence to nutrition and physical activity/exercise program aimed toward attainment of established weight goal    Improve shortness of breath with ADL's Yes    Intervention Provide education, individualized exercise  plan and daily activity instruction to help decrease symptoms of SOB with activities of daily living.    Expected Outcomes Short Term: Improve cardiorespiratory fitness to achieve a reduction of symptoms when performing ADLs;Long Term: Be able to perform more ADLs without symptoms or delay the onset of symptoms    Hypertension Yes    Intervention Provide education on lifestyle modifcations including regular physical activity/exercise, weight management, moderate sodium restriction and increased consumption of fresh fruit, vegetables, and low fat dairy, alcohol moderation, and smoking cessation.;Monitor prescription use compliance.    Expected Outcomes Short Term: Continued assessment and intervention until BP is < 140/81mm HG in hypertensive participants. < 130/33mm HG in hypertensive participants with diabetes, heart failure or chronic kidney disease.;Long Term: Maintenance of blood pressure at goal levels.    Lipids Yes    Intervention Provide education and support for participant on nutrition & aerobic/resistive exercise along with prescribed medications to achieve LDL 70mg , HDL >40mg .    Expected Outcomes Long Term: Cholesterol controlled with medications as prescribed, with individualized exercise RX and with personalized nutrition plan. Value goals: LDL < 70mg , HDL > 40 mg.;Short Term: Participant states understanding of desired cholesterol values and is compliant with medications prescribed. Participant is following exercise prescription and nutrition guidelines.          Tobacco Use Initial Evaluation: Social History   Tobacco Use  Smoking Status Never  Smokeless Tobacco Never    Exercise Goals and Review:  Exercise Goals     Row Name 07/22/23 1550 12/15/23 1505           Exercise Goals   Increase Physical Activity Yes Yes      Intervention Provide advice, education, support and counseling about physical activity/exercise needs.;Develop an individualized exercise prescription  for aerobic and resistive training based on initial evaluation findings, risk stratification, comorbidities and participant's personal goals. Provide advice, education, support and counseling about physical activity/exercise needs.;Develop an individualized exercise prescription for aerobic and resistive training based on initial evaluation findings, risk stratification, comorbidities and participant's personal goals.      Expected Outcomes Short Term: Attend rehab on a regular basis to increase amount of physical activity.;Long Term: Add in home exercise to make exercise part of routine and to increase amount of physical activity.;Long Term: Exercising regularly at least 3-5 days a week. Short Term: Attend rehab on a regular basis to increase amount of physical activity.;Long Term: Add in home exercise to make exercise part of routine and to increase amount of physical activity.;Long Term: Exercising regularly at least 3-5 days a week.      Increase Strength and Stamina Yes Yes      Intervention Provide advice, education, support and counseling about physical activity/exercise needs.;Develop an individualized exercise prescription for aerobic and resistive training based on initial evaluation findings, risk stratification, comorbidities and participant's  personal goals. Provide advice, education, support and counseling about physical activity/exercise needs.;Develop an individualized exercise prescription for aerobic and resistive training based on initial evaluation findings, risk stratification, comorbidities and participant's personal goals.      Expected Outcomes Short Term: Increase workloads from initial exercise prescription for resistance, speed, and METs.;Short Term: Perform resistance training exercises routinely during rehab and add in resistance training at home;Long Term: Improve cardiorespiratory fitness, muscular endurance and strength as measured by increased METs and functional capacity ( )  Short Term: Increase workloads from initial exercise prescription for resistance, speed, and METs.;Short Term: Perform resistance training exercises routinely during rehab and add in resistance training at home;Long Term: Improve cardiorespiratory fitness, muscular endurance and strength as measured by increased METs and functional capacity ( )      Able to understand and use rate of perceived exertion (RPE) scale Yes Yes      Intervention Provide education and explanation on how to use RPE scale Provide education and explanation on how to use RPE scale      Expected Outcomes Short Term: Able to use RPE daily in rehab to express subjective intensity level;Long Term:  Able to use RPE to guide intensity level when exercising independently Short Term: Able to use RPE daily in rehab to express subjective intensity level;Long Term:  Able to use RPE to guide intensity level when exercising independently      Able to understand and use Dyspnea scale Yes Yes      Intervention Provide education and explanation on how to use Dyspnea scale Provide education and explanation on how to use Dyspnea scale      Expected Outcomes Short Term: Able to use Dyspnea scale daily in rehab to express subjective sense of shortness of breath during exertion;Long Term: Able to use Dyspnea scale to guide intensity level when exercising independently Short Term: Able to use Dyspnea scale daily in rehab to express subjective sense of shortness of breath during exertion;Long Term: Able to use Dyspnea scale to guide intensity level when exercising independently      Knowledge and understanding of Target Heart Rate Range (THRR) Yes Yes      Intervention Provide education and explanation of THRR including how the numbers were predicted and where they are located for reference Provide education and explanation of THRR including how the numbers were predicted and where they are located for reference      Expected Outcomes Short Term: Able to  state/look up THRR;Short Term: Able to use daily as guideline for intensity in rehab;Long Term: Able to use THRR to govern intensity when exercising independently Short Term: Able to state/look up THRR;Short Term: Able to use daily as guideline for intensity in rehab;Long Term: Able to use THRR to govern intensity when exercising independently      Able to check pulse independently Yes Yes      Intervention Provide education and demonstration on how to check pulse in carotid and radial arteries.;Review the importance of being able to check your own pulse for safety during independent exercise Provide education and demonstration on how to check pulse in carotid and radial arteries.;Review the importance of being able to check your own pulse for safety during independent exercise      Expected Outcomes Short Term: Able to explain why pulse checking is important during independent exercise;Long Term: Able to check pulse independently and accurately Short Term: Able to explain why pulse checking is important during independent exercise;Long Term: Able to check pulse independently and accurately  Understanding of Exercise Prescription Yes Yes      Intervention Provide education, explanation, and written materials on patient's individual exercise prescription Provide education, explanation, and written materials on patient's individual exercise prescription      Expected Outcomes Short Term: Able to explain program exercise prescription;Long Term: Able to explain home exercise prescription to exercise independently Short Term: Able to explain program exercise prescription;Long Term: Able to explain home exercise prescription to exercise independently         Copy of goals given to participant.

## 2023-12-16 ENCOUNTER — Other Ambulatory Visit: Payer: Self-pay | Admitting: Cardiology

## 2023-12-16 DIAGNOSIS — H814 Vertigo of central origin: Secondary | ICD-10-CM | POA: Diagnosis not present

## 2023-12-17 ENCOUNTER — Encounter: Admitting: Emergency Medicine

## 2023-12-17 DIAGNOSIS — I2721 Secondary pulmonary arterial hypertension: Secondary | ICD-10-CM

## 2023-12-17 DIAGNOSIS — I5022 Chronic systolic (congestive) heart failure: Secondary | ICD-10-CM | POA: Diagnosis not present

## 2023-12-17 NOTE — Progress Notes (Signed)
 Daily Session Note  Patient Details  Name: Tonya Odonnell MRN: [id number] Date of Birth: [birth year] Referring Provider:   Flowsheet Row Pulmonary Rehab from 12/15/2023 in [facility]  Referring Provider Ezra Shuck    Encounter Date: 12/17/2023  Check In:  Session Check In - 12/17/23 0918       Check-In   Supervising physician immediately available to respond to emergencies See telemetry face sheet for immediately available ER MD    Location [facility]-Cardiac & Pulmonary Rehab    Staff Present Fairy Plater RCP,RRT,BSRT;Quaneshia Wareing RN,BSN;Maxon Burnell BS, Exercise Physiologist;Margaret Best, MS, Exercise Physiologist    Virtual Visit No    Medication changes reported     No    Fall or balance concerns reported    No    Tobacco Cessation No Change    Warm-up and Cool-down Performed on first and last piece of equipment    Resistance Training Performed Yes    VAD Patient? No    PAD/SET Patient? No      Pain Assessment   Currently in Pain? No/denies             Social History   Tobacco Use  Smoking Status Never  Smokeless Tobacco Never    Goals Met:  Proper associated with RPD/PD & O2 Sat Independence with exercise equipment Using PLB without cueing & demonstrates good technique Exercise tolerated well No report of concerns or symptoms today Strength training completed today  Goals Unmet:  Not Applicable  Comments: First full day of exercise!  Patient was oriented to gym and equipment including functions, settings, policies, and procedures.  Patient's individual exercise prescription and treatment plan were reviewed.  All starting workloads were established based on the results of the 6 minute walk test done at initial orientation visit.  The plan for exercise progression was also introduced and progression will be customized based on patient's performance and goals.    Dr. Oneil Pinal is Medical Director for [facility].   Dr. Fuad Aleskerov is Medical Director for [facility] Pulmonary Rehabilitation.

## 2023-12-18 DIAGNOSIS — H814 Vertigo of central origin: Secondary | ICD-10-CM | POA: Diagnosis not present

## 2023-12-21 ENCOUNTER — Other Ambulatory Visit: Payer: Self-pay | Admitting: Sports Medicine

## 2023-12-21 DIAGNOSIS — H814 Vertigo of central origin: Secondary | ICD-10-CM | POA: Diagnosis not present

## 2023-12-22 ENCOUNTER — Encounter

## 2023-12-23 DIAGNOSIS — H814 Vertigo of central origin: Secondary | ICD-10-CM | POA: Diagnosis not present

## 2023-12-24 ENCOUNTER — Encounter

## 2023-12-24 DIAGNOSIS — H814 Vertigo of central origin: Secondary | ICD-10-CM | POA: Diagnosis not present

## 2023-12-25 DIAGNOSIS — Z7689 Persons encountering health services in other specified circumstances: Secondary | ICD-10-CM | POA: Diagnosis not present

## 2023-12-28 DIAGNOSIS — H814 Vertigo of central origin: Secondary | ICD-10-CM | POA: Diagnosis not present

## 2023-12-29 ENCOUNTER — Encounter

## 2023-12-30 ENCOUNTER — Other Ambulatory Visit (HOSPITAL_COMMUNITY): Payer: Self-pay

## 2023-12-30 ENCOUNTER — Encounter: Payer: Self-pay | Admitting: *Deleted

## 2023-12-30 DIAGNOSIS — I2721 Secondary pulmonary arterial hypertension: Secondary | ICD-10-CM

## 2023-12-30 DIAGNOSIS — H814 Vertigo of central origin: Secondary | ICD-10-CM | POA: Diagnosis not present

## 2023-12-30 NOTE — Progress Notes (Signed)
 Pulmonary Individual Treatment Plan  Patient Details  Name: Jennette Leask MRN: [id number] Date of Birth: [birth year] Referring Provider:   Flowsheet Row Pulmonary Rehab from 12/15/2023 in [facility] Cardiac and Pulmonary Rehab  Referring Provider Ezra Shuck    Initial Encounter Date:  Flowsheet Row Pulmonary Rehab from 12/15/2023 in [facility] Cardiac and Pulmonary Rehab  Date 12/15/23    Visit Diagnosis: Pulmonary artery hypertension (HCC)  Patient's Home Medications on Admission:  Current Outpatient Medications:    ambrisentan  (LETAIRIS ) 10 MG tablet, Take 1 tablet (10 mg total) by mouth daily., Disp: 30 tablet, Rfl: 11   Azelaic Acid  15 % gel, Apply to face every morning, Disp: 50 g, Rfl: 5   bismuth subsalicylate (PEPTO BISMOL) 262 MG/15ML suspension, Take 30 mLs by mouth every 6 (six) hours as needed for diarrhea or loose stools., Disp: , Rfl:    clobetasol  cream (TEMOVATE ) 0.05 %, Apply 1 Application topically as needed., Disp: 30 g, Rfl: 4   cromolyn  (OPTICROM ) 4 % ophthalmic solution, Place 1 drop into both eyes 4 (four) times daily., Disp: 10 mL, Rfl: 4   ELIDEL  1 % cream, Apply to red scaly areas on the face BID for seb derm, Disp: 30 g, Rfl: 4   fluticasone  (FLONASE ) 50 MCG/ACT nasal spray, Place 2 sprays into both nostrils daily as needed for allergies or rhinitis., Disp: , Rfl:    furosemide  (LASIX ) 40 MG tablet, Take 1 tablet (40 mg total) by mouth daily., Disp: 90 tablet, Rfl: 1   hydrALAZINE  (APRESOLINE ) 25 MG tablet, Take 1 tablet (25 mg total) by mouth 2 (two) times daily., Disp: 180 tablet, Rfl: 1   Ivermectin  (SOOLANTRA ) 1 % CREA, Apply 1 Application topically at bedtime., Disp: 45 g, Rfl: 5   levothyroxine  (SYNTHROID ) 112 MCG tablet, Take 1 pill everyday except Sunday take half a pill, Disp: 90 tablet, Rfl: 3   meclizine  (ANTIVERT ) 25 MG tablet, Take 1 tablet (25 mg total) by mouth 3 (three) times daily as needed for dizziness., Disp: 30 tablet, Rfl: 0   meloxicam  (MOBIC ) 15 MG  tablet, Take 1 tablet (15 mg total) by mouth daily., Disp: 21 tablet, Rfl: 0   omeprazole  (PRILOSEC) 40 MG capsule, Take 40 mg by mouth daily as needed (Heartburn)., Disp: , Rfl:    ondansetron  (ZOFRAN ) 4 MG tablet, Take 1 tablet (4 mg total) by mouth every 8 (eight) hours as needed for nausea or vomiting., Disp: 15 tablet, Rfl: 0   potassium chloride  SA (KLOR-CON  M) 20 MEQ tablet, Take 1 tablet (20 mEq total) by mouth daily., Disp: 90 tablet, Rfl: 1   Selexipag  800 MCG TABS, Take 1 tablet (800 mcg total) by mouth 2 (two) times daily., Disp: 60 tablet, Rfl: 11   sotatercept (WINREVAIR ) subcutaneous injection, Inject 0.5 mL (0.3 mg/kg) subcutaneously for the first dose, then inject 1.1 mL (0.7 mg/kg) subcutaneously every 3 weeks. Dosing weight 80.9 kg., Disp: , Rfl:    tadalafil , PAH, (ADCIRCA ) 20 MG tablet, TAKE 2 TABLETS (40 MG TOTAL) BY MOUTH DAILY., Disp: 180 tablet, Rfl: 3   valACYclovir  (VALTREX ) 1000 MG tablet, Take 1,000 mg by mouth daily as needed (Cold Sores)., Disp: , Rfl:   Past Medical History: Past Medical History:  Diagnosis Date   Anemia    USE TO HAVE IT,NOT NOW,2015,2016   Arthritis    HIPS,KNEES,HANDS   Breast cancer in female (HCC)    Cancer (HCC)    LEFT   Hyperlipidemia    Hypertension    Low  iron    Neuromuscular disorder (HCC)    MUSCLE PAIN,FIBROMYALGIA   Osteopenia    Pulmonary HTN (HCC)    PVC (premature ventricular contraction)    Thyroid  disease     Tobacco Use: Social History   Tobacco Use  Smoking Status Never  Smokeless Tobacco Never    Labs: Review Flowsheet  More data exists      Latest Ref Rng & Units 12/12/2022 12/22/2022 07/02/2023 08/19/2023 08/28/2023  Labs for ITP Cardiac and Pulmonary Rehab  Cholestrol 100 - 199 mg/dL - - - 723  -  LDL (calc) 0 - 99 mg/dL - - - 814  -  HDL-C >60 mg/dL - - - 73  -  Trlycerides 0 - 149 mg/dL - - - 892  -  Hemoglobin A1c 4.8 - 5.6 % - 5.7  - 5.2  -  PH, Arterial 7.35 - 7.45 7.399  - - - -  PCO2  arterial 32 - 48 mmHg 35.2  - - - -  Bicarbonate 20.0 - 28.0 mmol/L 21.8  24.0  - 23.9  23.5  - -  TCO2 22 - 32 mmol/L 23  25  - 25  25  - 22   Acid-base deficit 0.0 - 2.0 mmol/L 2.0  1.0  - 1.0  2.0  - -  O2 Saturation % 94  66  - 71  71  - -    Details       Multiple values from one day are sorted in reverse-chronological order          Pulmonary Assessment Scores:  Pulmonary Assessment Scores     Row Name 12/15/23 1506         ADL UCSD   ADL Phase Entry     SOB Score total 95     Rest 4     Walk 4     Stairs 4     Bath 4     Dress 3     Shop 4       CAT Score   CAT Score 28       mMRC Score   mMRC Score 4        UCSD: Self-administered rating of dyspnea associated with activities of daily living (ADLs) 6-point scale (0 = not at all to 5 = maximal or unable to do because of breathlessness)  Scoring Scores range from 0 to 120.  Minimally important difference is 5 units  CAT: CAT can identify the health impairment of COPD patients and is better correlated with disease progression.  CAT has a scoring range of zero to 40. The CAT score is classified into four groups of low (less than 10), medium (10 - 20), high (21-30) and very high (31-40) based on the impact level of disease on health status. A CAT score over 10 suggests significant symptoms.  A worsening CAT score could be explained by an exacerbation, poor medication adherence, poor inhaler technique, or progression of COPD or comorbid conditions.  CAT MCID is 2 points  mMRC: mMRC (Modified Medical Research Council) Dyspnea Scale is used to assess the degree of baseline functional disability in patients of respiratory disease due to dyspnea. No minimal important difference is established. A decrease in score of 1 point or greater is considered a positive change.   Pulmonary Function Assessment:   Exercise Target Goals: Exercise Program Goal: Individual exercise prescription set using results from  initial 6 min walk test and THRR while considering  patient's  activity barriers and safety.   Exercise Prescription Goal: Initial exercise prescription builds to 30-45 minutes a day of aerobic activity, 2-3 days per week.  Home exercise guidelines will be given to patient during program as part of exercise prescription that the participant will acknowledge.  Education: Aerobic Exercise: - Group verbal and visual presentation on the components of exercise prescription. Introduces F.I.T.T principle from ACSM for exercise prescriptions.  Reviews F.I.T.T. principles of aerobic exercise including progression. Written material provided at class time.   Education: Resistance Exercise: - Group verbal and visual presentation on the components of exercise prescription. Introduces F.I.T.T principle from ACSM for exercise prescriptions  Reviews F.I.T.T. principles of resistance exercise including progression. Written material provided at class time.    Education: Exercise & Equipment Safety: - Individual verbal instruction and demonstration of equipment use and safety with use of the equipment. Flowsheet Row Pulmonary Rehab from 12/15/2023 in [facility] Cardiac and Pulmonary Rehab  Date 12/15/23  Educator [facility]  Instruction Review Code 1- Verbalizes Understanding    Education: Exercise Physiology & General Exercise Guidelines: - Group verbal and written instruction with models to review the exercise physiology of the cardiovascular system and associated critical values. Provides general exercise guidelines with specific guidelines to those with heart or lung disease.    Education: Flexibility, Balance, Mind/Body Relaxation: - Group verbal and visual presentation with interactive activity on the components of exercise prescription. Introduces F.I.T.T principle from ACSM for exercise prescriptions. Reviews F.I.T.T. principles of flexibility and balance exercise training including progression. Also discusses the  mind body connection.  Reviews various relaxation techniques to help reduce and manage stress (i.e. Deep breathing, progressive muscle relaxation, and visualization). Balance handout provided to take home. Written material provided at class time.   Activity Barriers & Risk Stratification:  Activity Barriers & Cardiac Risk Stratification - 12/15/23 1459       Activity Barriers & Cardiac Risk Stratification   Activity Barriers Back Problems;Fibromyalgia;Other (comment)    Comments Vertigo          6 Minute Walk:  6 Minute Walk     Row Name 12/15/23 1456         6 Minute Walk   Phase Initial     Distance 1220 feet     Walk Time 6 minutes     # of Rest Breaks 0     MPH 2.3     METS 3.34     RPE 7     Perceived Dyspnea  1     VO2 Peak 11.7     Symptoms No     Resting HR 97 bpm     Resting BP 134/80     Resting Oxygen Saturation  93 %     Exercise Oxygen Saturation  during 6 min walk 92 %     Max Ex. HR 111 bpm     Max Ex. BP 132/82     2 Minute Post BP 124/80       Interval HR   1 Minute HR 97     2 Minute HR 115     3 Minute HR 101     4 Minute HR 104     5 Minute HR 111     6 Minute HR 109     2 Minute Post HR 100     Interval Heart Rate? Yes       Interval Oxygen   Interval Oxygen? Yes     Baseline Oxygen Saturation % 93 %  1 Minute Oxygen Saturation % 93 %     1 Minute Liters of Oxygen 0 L     2 Minute Oxygen Saturation % 92 %     2 Minute Liters of Oxygen 0 L     3 Minute Oxygen Saturation % 94 %     3 Minute Liters of Oxygen 0 L     4 Minute Oxygen Saturation % 96 %     4 Minute Liters of Oxygen 0 L     5 Minute Oxygen Saturation % 93 %     5 Minute Liters of Oxygen 0 L     6 Minute Oxygen Saturation % 95 %     6 Minute Liters of Oxygen 0 L     2 Minute Post Oxygen Saturation % 96 %     2 Minute Post Liters of Oxygen 0 L       Oxygen Initial Assessment:  Oxygen Initial Assessment - 12/10/23 1414       Home Oxygen   Home Oxygen Device None     Sleep Oxygen Prescription None    Home Exercise Oxygen Prescription None    Home Resting Oxygen Prescription None      Intervention   Short Term Goals To learn and understand importance of maintaining oxygen saturations>88%;To learn and understand importance of monitoring SPO2 with pulse oximeter and demonstrate accurate use of the pulse oximeter.;To learn and demonstrate proper use of respiratory medications;To learn and demonstrate proper pursed lip breathing techniques or other breathing techniques.     Long  Term Goals Exhibits proper breathing techniques, such as pursed lip breathing or other method taught during program session;Maintenance of O2 saturations>88%;Verbalizes importance of monitoring SPO2 with pulse oximeter and return demonstration;Demonstrates proper use of MDI's;Compliance with respiratory medication          Oxygen Re-Evaluation:  Oxygen Re-Evaluation     Row Name 12/17/23 0921             Goals/Expected Outcomes   Comments Reviewed PLB technique with pt.  Talked about how it works and it's importance in maintaining their exercise saturations.       Goals/Expected Outcomes Short: Become more profiecient at using PLB.   Long: Become independent at using PLB.          Oxygen Discharge (Final Oxygen Re-Evaluation):  Oxygen Re-Evaluation - 12/17/23 0921       Goals/Expected Outcomes   Comments Reviewed PLB technique with pt.  Talked about how it works and it's importance in maintaining their exercise saturations.    Goals/Expected Outcomes Short: Become more profiecient at using PLB.   Long: Become independent at using PLB.          Initial Exercise Prescription:  Initial Exercise Prescription - 12/15/23 1400       Date of Initial Exercise RX and Referring Provider   Date 12/15/23    Referring Provider Ezra Shuck      Oxygen   Maintain Oxygen Saturation 88% or higher      Recumbant Bike   Level 3    RPM 50    Watts 25    Minutes 15     METs 3.3      NuStep   Level 3    SPM 80    Minutes 15    METs 3.3      Arm Ergometer   Level 3    Watts 25    RPM 50    Minutes 15  METs 3.3      REL-XR   Level 3    Watts 25    Speed 50    Minutes 15    METs 3.3      Prescription Details   Duration Progress to 30 minutes of continuous aerobic without signs/symptoms of physical distress      Intensity   THRR 40-80% of Max Heartrate 123-149    Ratings of Perceived Exertion 11-13    Perceived Dyspnea 0-4      Progression   Progression Continue to progress workloads to maintain intensity without signs/symptoms of physical distress.      Resistance Training   Training Prescription Yes    Weight 3lb    Reps 10-15          Perform Capillary Blood Glucose checks as needed.  Exercise Prescription Changes:   Exercise Prescription Changes     Row Name 12/15/23 1500 12/22/23 1400           Response to Exercise   Blood Pressure (Admit) 134/80 102/64      Blood Pressure (Exercise) 132/82 108/68      Blood Pressure (Exit) 124/80 100/58      Heart Rate (Admit) 97 bpm 104 bpm      Heart Rate (Exercise) 115 bpm 141 bpm      Heart Rate (Exit) 100 bpm 106 bpm      Oxygen Saturation (Admit) 93 % 90 %      Oxygen Saturation (Exercise) 92 % 95 %      Oxygen Saturation (Exit) 96 % 96 %      Rating of Perceived Exertion (Exercise) 7 14      Perceived Dyspnea (Exercise) 1 1      Symptoms none none      Comments results 1st day of exercise      Duration -- Progress to 30 minutes of  aerobic without signs/symptoms of physical distress      Intensity -- THRR unchanged        Progression   Progression -- Continue to progress workloads to maintain intensity without signs/symptoms of physical distress.      Average METs -- 3.1        Resistance Training   Training Prescription -- Yes      Weight -- 3lb      Reps -- 10-15        Interval Training   Interval Training -- No        Recumbant Bike   Level -- 3       Watts -- 32      Minutes -- 15        NuStep   Level -- 2      Minutes -- 15      METs -- 3.1        Oxygen   Maintain Oxygen Saturation 88% or higher 88% or higher         Exercise Comments:   Exercise Comments     Row Name 12/17/23 0919           Exercise Comments First full day of exercise!  Patient was oriented to gym and equipment including functions, settings, policies, and procedures.  Patient's individual exercise prescription and treatment plan were reviewed.  All starting workloads were established based on the results of the 6 minute walk test done at initial orientation visit.  The plan for exercise progression was also introduced and progression will be customized based on  patient's performance and goals.          Exercise Goals and Review:   Exercise Goals     Row Name 12/15/23 1505             Exercise Goals   Increase Physical Activity Yes       Intervention Provide advice, education, support and counseling about physical activity/exercise needs.;Develop an individualized exercise prescription for aerobic and resistive training based on initial evaluation findings, risk stratification, comorbidities and participant's personal goals.       Expected Outcomes Short Term: Attend rehab on a regular basis to increase amount of physical activity.;Long Term: Add in home exercise to make exercise part of routine and to increase amount of physical activity.;Long Term: Exercising regularly at least 3-5 days a week.       Increase Strength and Stamina Yes       Intervention Provide advice, education, support and counseling about physical activity/exercise needs.;Develop an individualized exercise prescription for aerobic and resistive training based on initial evaluation findings, risk stratification, comorbidities and participant's personal goals.       Expected Outcomes Short Term: Increase workloads from initial exercise prescription for resistance, speed, and  METs.;Short Term: Perform resistance training exercises routinely during rehab and add in resistance training at home;Long Term: Improve cardiorespiratory fitness, muscular endurance and strength as measured by increased METs and functional capacity ( )       Able to understand and use rate of perceived exertion (RPE) scale Yes       Intervention Provide education and explanation on how to use RPE scale       Expected Outcomes Short Term: Able to use RPE daily in rehab to express subjective intensity level;Long Term:  Able to use RPE to guide intensity level when exercising independently       Able to understand and use Dyspnea scale Yes       Intervention Provide education and explanation on how to use Dyspnea scale       Expected Outcomes Short Term: Able to use Dyspnea scale daily in rehab to express subjective sense of shortness of breath during exertion;Long Term: Able to use Dyspnea scale to guide intensity level when exercising independently       Knowledge and understanding of Target Heart Rate Range (THRR) Yes       Intervention Provide education and explanation of THRR including how the numbers were predicted and where they are located for reference       Expected Outcomes Short Term: Able to state/look up THRR;Short Term: Able to use daily as guideline for intensity in rehab;Long Term: Able to use THRR to govern intensity when exercising independently       Able to check pulse independently Yes       Intervention Provide education and demonstration on how to check pulse in carotid and radial arteries.;Review the importance of being able to check your own pulse for safety during independent exercise       Expected Outcomes Short Term: Able to explain why pulse checking is important during independent exercise;Long Term: Able to check pulse independently and accurately       Understanding of Exercise Prescription Yes       Intervention Provide education, explanation, and written materials  on patient's individual exercise prescription       Expected Outcomes Short Term: Able to explain program exercise prescription;Long Term: Able to explain home exercise prescription to exercise independently  Exercise Goals Re-Evaluation :  Exercise Goals Re-Evaluation     Row Name 12/17/23 0921 12/22/23 1453           Exercise Goal Re-Evaluation   Exercise Goals Review Increase Physical Activity;Able to understand and use rate of perceived exertion (RPE) scale;Knowledge and understanding of Target Heart Rate Range (THRR);Understanding of Exercise Prescription;Increase Strength and Stamina;Able to understand and use Dyspnea scale;Able to check pulse independently Increase Physical Activity;Understanding of Exercise Prescription;Increase Strength and Stamina      Comments Reviewed RPE and dyspnea scale, THR and program prescription with pt today.  Pt voiced understanding and was given a copy of goals to take home. Sharonann is off to a good start in the program and completed her first day in this review. She worked at level 3 on the recumbent bike with 32 watts and level 2 on the T4 nustep. We will continue to monitor her progress in the program.      Expected Outcomes Short: Use RPE daily to regulate intensity.  Long: Follow program prescription in THR. Short: Continue to follow current exercise prescription. Long: Continue exercise to improve strength and stamina.         Discharge Exercise Prescription (Final Exercise Prescription Changes):  Exercise Prescription Changes - 12/22/23 1400       Response to Exercise   Blood Pressure (Admit) 102/64    Blood Pressure (Exercise) 108/68    Blood Pressure (Exit) 100/58    Heart Rate (Admit) 104 bpm    Heart Rate (Exercise) 141 bpm    Heart Rate (Exit) 106 bpm    Oxygen Saturation (Admit) 90 %    Oxygen Saturation (Exercise) 95 %    Oxygen Saturation (Exit) 96 %    Rating of Perceived Exertion (Exercise) 14    Perceived Dyspnea  (Exercise) 1    Symptoms none    Comments 1st day of exercise    Duration Progress to 30 minutes of  aerobic without signs/symptoms of physical distress    Intensity THRR unchanged      Progression   Progression Continue to progress workloads to maintain intensity without signs/symptoms of physical distress.    Average METs 3.1      Resistance Training   Training Prescription Yes    Weight 3lb    Reps 10-15      Interval Training   Interval Training No      Recumbant Bike   Level 3    Watts 32    Minutes 15      NuStep   Level 2    Minutes 15    METs 3.1      Oxygen   Maintain Oxygen Saturation 88% or higher          Nutrition:  Target Goals: Understanding of nutrition guidelines, daily intake of sodium 1500mg , cholesterol 200mg , calories 30% from fat and 7% or less from saturated fats, daily to have 5 or more servings of fruits and vegetables.  Education: Nutrition 1 -Group instruction provided by verbal, written material, interactive activities, discussions, models, and posters to present general guidelines for heart healthy nutrition including macronutrients, label reading, and promoting whole foods over processed counterparts. Education serves as Pensions consultant of discussion of heart healthy eating for all. Written material provided at class time.     Education: Nutrition 2 -Group instruction provided by verbal, written material, interactive activities, discussions, models, and posters to present general guidelines for heart healthy nutrition including sodium, cholesterol, and saturated fat. Providing guidance  of habit forming to improve blood pressure, cholesterol, and body weight. Written material provided at class time.     Biometrics:  Pre Biometrics - 12/15/23 1505       Pre Biometrics   Height 5' 3.5 (1.613 m)    Weight 176 lb 3.2 oz (79.9 kg)    Waist Circumference 38 inches    Hip Circumference 42 inches    Waist to Hip Ratio 0.9 %    BMI  (Calculated) 30.72    Single Leg Stand 29.75 seconds           Nutrition Therapy Plan and Nutrition Goals:   Nutrition Assessments:  MEDIFICTS Score Key: >=70 Need to make dietary changes  40-70 Heart Healthy Diet <= 40 Therapeutic Level Cholesterol Diet   Picture Your Plate Scores: <59 Unhealthy dietary pattern with much room for improvement. 41-50 Dietary pattern unlikely to meet recommendations for good health and room for improvement. 51-60 More healthful dietary pattern, with some room for improvement.  >60 Healthy dietary pattern, although there may be some specific behaviors that could be improved.   Nutrition Goals Re-Evaluation:   Nutrition Goals Discharge (Final Nutrition Goals Re-Evaluation):   Psychosocial: Target Goals: Acknowledge presence or absence of significant depression and/or stress, maximize coping skills, provide positive support system. Participant is able to verbalize types and ability to use techniques and skills needed for reducing stress and depression.   Education: Stress, Anxiety, and Depression - Group verbal and visual presentation to define topics covered.  Reviews how body is impacted by stress, anxiety, and depression.  Also discusses healthy ways to reduce stress and to treat/manage anxiety and depression.  Written material provided at class time.   Education: Sleep Hygiene -Provides group verbal and written instruction about how sleep can affect your health.  Define sleep hygiene, discuss sleep cycles and impact of sleep habits. Review good sleep hygiene tips.    Initial Review & Psychosocial Screening:  Initial Psych Review & Screening - 12/10/23 1408       Initial Review   Current issues with None Identified      Family Dynamics   Good Support System? Yes   son, ex-husband, social worker   Comments Keela states that she previously attended pulmonary rehab, however she had to discharge early due to difficulty with veritgo. She  reports that these symptoms are more well controlled at this time, however she should avoid the treadmill and walking in circles. Pamela states that she is seeing a spine specialist due to chronic back issues and that they are talking about physical therapy to improve pain and mobility, however this has not been set up at this time. She denies any mental health concerns and states that she has a good support system, consisting of her son and ex-husband. Jayde does want to take advantage of the dietician services to help balance her diet.      Barriers   Psychosocial barriers to participate in program There are no identifiable barriers or psychosocial needs.;The patient should benefit from training in stress management and relaxation.      Screening Interventions   Interventions Encouraged to exercise;To provide support and resources with identified psychosocial needs;Provide feedback about the scores to participant    Expected Outcomes Short Term goal: Utilizing psychosocial counselor, staff and physician to assist with identification of specific Stressors or current issues interfering with healing process. Setting desired goal for each stressor or current issue identified.;Long Term Goal: Stressors or current issues are controlled or  eliminated.;Short Term goal: Identification and review with participant of any Quality of Life or Depression concerns found by scoring the questionnaire.;Long Term goal: The participant improves quality of Life and PHQ9 Scores as seen by post scores and/or verbalization of changes          Quality of Life Scores:  Scores of 19 and below usually indicate a poorer quality of life in these areas.  A difference of  2-3 points is a clinically meaningful difference.  A difference of 2-3 points in the total score of the Quality of Life Index has been associated with significant improvement in overall quality of life, self-image, physical symptoms, and general health in studies  assessing change in quality of life.  PHQ-9: Review Flowsheet  More data exists      12/15/2023 07/22/2023 12/10/2022 06/17/2021 06/13/2021  Depression screen PHQ 2/9  Decreased Interest 0 0 1 1 0 0  Down, Depressed, Hopeless 0 0 1 1 0 0  PHQ - 2 Score 0 0 2 2 0 0  Altered sleeping 0 0 3 3 2  -  Tired, decreased energy 3 3 3 3 3  -  Change in appetite 2 2 1 2  0 -  Feeling bad or failure about yourself  0 0 -- 0 1 -  Trouble concentrating 1 0 0 0 1 -  Moving slowly or fidgety/restless 0 0 0 1 0 -  Suicidal thoughts 0 0 -- 0 0 -  PHQ-9 Score 6 5 9 11 7  -  Difficult doing work/chores - - Very difficult Very difficult - -    Details       Multiple values from one day are sorted in reverse-chronological order        Interpretation of Total Score  Total Score Depression Severity:  1-4 = Minimal depression, 5-9 = Mild depression, 10-14 = Moderate depression, 15-19 = Moderately severe depression, 20-27 = Severe depression   Psychosocial Evaluation and Intervention:  Psychosocial Evaluation - 12/10/23 1412       Psychosocial Evaluation & Interventions   Interventions Encouraged to exercise with the program and follow exercise prescription;Relaxation education;Stress management education    Comments Evin states that she previously attended pulmonary rehab, however she had to discharge early due to difficulty with veritgo. She reports that these symptoms are more well controlled at this time, however she should avoid the treadmill and walking in circles. Sarabelle states that she is seeing a spine specialist due to chronic back issues and that they are talking about physical therapy to improve pain and mobility, however this has not been set up at this time. She denies any mental health concerns and states that she has a good support system, consisting of her son and ex-husband. Nansi does want to take advantage of the dietician services to help balance her diet.    Expected Outcomes Short: Attend  pulmonary rehab for education and exercise. Long: Develop and maintain positive self care habits.    Continue Psychosocial Services  Follow up required by staff          Psychosocial Re-Evaluation:   Psychosocial Discharge (Final Psychosocial Re-Evaluation):   Education: Education Goals: Education classes will be provided on a weekly basis, covering required topics. Participant will state understanding/return demonstration of topics presented.  Learning Barriers/Preferences:  Learning Barriers/Preferences - 12/10/23 1407       Learning Barriers/Preferences   Learning Barriers None    Learning Preferences None          General Pulmonary  Education Topics:  Infection Prevention: - Provides verbal and written material to individual with discussion of infection control including proper hand washing and proper equipment cleaning during exercise session. Flowsheet Row Pulmonary Rehab from 12/15/2023 in [facility] Cardiac and Pulmonary Rehab  Date 12/15/23  Educator [facility]  Instruction Review Code 1- Verbalizes Understanding    Falls Prevention: - Provides verbal and written material to individual with discussion of falls prevention and safety. Flowsheet Row Pulmonary Rehab from 12/15/2023 in [facility] Cardiac and Pulmonary Rehab  Date 12/15/23  Educator [facility]  Instruction Review Code 1- Verbalizes Understanding    Chronic Lung Disease Review: - Group verbal instruction with posters, models, PowerPoint presentations and videos,  to review new updates, new respiratory medications, new advancements in procedures and treatments. Providing information on websites and 800 numbers for continued self-education. Includes information about supplement oxygen, available portable oxygen systems, continuous and intermittent flow rates, oxygen safety, concentrators, and Medicare reimbursement for oxygen. Explanation of Pulmonary Drugs, including class, frequency, complications, importance of spacers, rinsing  mouth after steroid MDI's, and proper cleaning methods for nebulizers. Review of basic lung anatomy and physiology related to function, structure, and complications of lung disease. Review of risk factors. Discussion about methods for diagnosing sleep apnea and types of masks and machines for OSA. Includes a review of the use of types of environmental controls: home humidity, furnaces, filters, dust mite/pet prevention, HEPA vacuums. Discussion about weather changes, air quality and the benefits of nasal washing. Instruction on Warning signs, infection symptoms, calling MD promptly, preventive modes, and value of vaccinations. Review of effective airway clearance, coughing and/or vibration techniques. Emphasizing that all should Create an Action Plan. Written material provided at class time.   AED/CPR: - Group verbal and written instruction with the use of models to demonstrate the basic use of the AED with the basic ABC's of resuscitation.    Tests and Procedures:  - Group verbal and visual presentation and models provide information about basic cardiac anatomy and function. Reviews the testing methods done to diagnose heart disease and the outcomes of the test results. Describes the treatment choices: Medical Management, Angioplasty, or Coronary Bypass Surgery for treating various heart conditions including Myocardial Infarction, Angina, Valve Disease, and Cardiac Arrhythmias.  Written material provided at class time.   Medication Safety: - Group verbal and visual instruction to review commonly prescribed medications for heart and lung disease. Reviews the medication, class of the drug, and side effects. Includes the steps to properly store meds and maintain the prescription regimen.  Written material given at graduation.   Other: -Provides group and verbal instruction on various topics (see comments)   Knowledge Questionnaire Score:    Core Components/Risk Factors/Patient Goals at  Admission:  Personal Goals and Risk Factors at Admission - 12/10/23 1406       Core Components/Risk Factors/Patient Goals on Admission    Weight Management Yes;Weight Loss    Intervention Weight Management: Develop a combined nutrition and exercise program designed to reach desired caloric intake, while maintaining appropriate intake of nutrient and fiber, sodium and fats, and appropriate energy expenditure required for the weight goal.;Weight Management: Provide education and appropriate resources to help participant work on and attain dietary goals.;Weight Management/Obesity: Establish reasonable short term and long term weight goals.;Obesity: Provide education and appropriate resources to help participant work on and attain dietary goals.    Goal Weight: Short Term 155 lb (70.3 kg)    Expected Outcomes Short Term: Continue to assess and modify interventions until short  term weight is achieved;Weight Loss: Understanding of general recommendations for a balanced deficit meal plan, which promotes 1-2 lb weight loss per week and includes a negative energy balance of [id number] kcal/d;Understanding recommendations for meals to include 15-35% energy as protein, 25-35% energy from fat, 35-60% energy from carbohydrates, less than 200mg  of dietary cholesterol, 20-35 gm of total fiber daily;Understanding of distribution of calorie intake throughout the day with the consumption of 4-5 meals/snacks;Long Term: Adherence to nutrition and physical activity/exercise program aimed toward attainment of established weight goal    Improve shortness of breath with ADL's Yes    Intervention Provide education, individualized exercise plan and daily activity instruction to help decrease symptoms of SOB with activities of daily living.    Expected Outcomes Short Term: Improve cardiorespiratory fitness to achieve a reduction of symptoms when performing ADLs;Long Term: Be able to perform more ADLs without symptoms or delay the  onset of symptoms    Hypertension Yes    Intervention Provide education on lifestyle modifcations including regular physical activity/exercise, weight management, moderate sodium restriction and increased consumption of fresh fruit, vegetables, and low fat dairy, alcohol moderation, and smoking cessation.;Monitor prescription use compliance.    Expected Outcomes Short Term: Continued assessment and intervention until BP is < 140/36mm HG in hypertensive participants. < 130/26mm HG in hypertensive participants with diabetes, heart failure or chronic kidney disease.;Long Term: Maintenance of blood pressure at goal levels.    Lipids Yes    Intervention Provide education and support for participant on nutrition & aerobic/resistive exercise along with prescribed medications to achieve LDL 70mg , HDL >40mg .    Expected Outcomes Long Term: Cholesterol controlled with medications as prescribed, with individualized exercise RX and with personalized nutrition plan. Value goals: LDL < 70mg , HDL > 40 mg.;Short Term: Participant states understanding of desired cholesterol values and is compliant with medications prescribed. Participant is following exercise prescription and nutrition guidelines.          Education:Diabetes - Individual verbal and written instruction to review signs/symptoms of diabetes, desired ranges of glucose level fasting, after meals and with exercise. Acknowledge that pre and post exercise glucose checks will be done for 3 sessions at entry of program.   Know Your Numbers and Heart Failure: - Group verbal and visual instruction to discuss disease risk factors for cardiac and pulmonary disease and treatment options.  Reviews associated critical values for Overweight/Obesity, Hypertension, Cholesterol, and Diabetes.  Discusses basics of heart failure: signs/symptoms and treatments.  Introduces Heart Failure Zone chart for action plan for heart failure. Written material provided at class  time.   Core Components/Risk Factors/Patient Goals Review:    Core Components/Risk Factors/Patient Goals at Discharge (Final Review):    ITP Comments:  ITP Comments     Row Name 12/10/23 1350 12/15/23 1456 12/17/23 0919 12/30/23 1038     ITP Comments Initial phone call completed. Diagnosis can be found in [facility] 11/24/2023. EP Orientation scheduled for Tuesday, 12/15/2023. Completed and gym orientation for pulmonary rehab. Initial ITP created and sent for review to Dr. Fuad Aleskerov, Medical Director. First full day of exercise!  Patient was oriented to gym and equipment including functions, settings, policies, and procedures.  Patient's individual exercise prescription and treatment plan were reviewed.  All starting workloads were established based on the results of the 6 minute walk test done at initial orientation visit.  The plan for exercise progression was also introduced and progression will be customized based on patient's performance and goals. 30 Day review completed.  Medical Director ITP review done, changes made as directed, and signed approval by Medical Director.       Comments: 30 day review

## 2023-12-31 ENCOUNTER — Encounter

## 2024-01-04 ENCOUNTER — Other Ambulatory Visit: Payer: Self-pay | Admitting: Nurse Practitioner

## 2024-01-04 ENCOUNTER — Other Ambulatory Visit (HOSPITAL_COMMUNITY): Payer: Self-pay

## 2024-01-04 ENCOUNTER — Other Ambulatory Visit: Payer: Self-pay

## 2024-01-04 DIAGNOSIS — H814 Vertigo of central origin: Secondary | ICD-10-CM | POA: Diagnosis not present

## 2024-01-05 ENCOUNTER — Telehealth: Payer: Self-pay

## 2024-01-05 ENCOUNTER — Encounter

## 2024-01-05 ENCOUNTER — Other Ambulatory Visit: Payer: Self-pay

## 2024-01-05 DIAGNOSIS — I2721 Secondary pulmonary arterial hypertension: Secondary | ICD-10-CM

## 2024-01-05 DIAGNOSIS — Z7689 Persons encountering health services in other specified circumstances: Secondary | ICD-10-CM | POA: Diagnosis not present

## 2024-01-05 DIAGNOSIS — I5022 Chronic systolic (congestive) heart failure: Secondary | ICD-10-CM | POA: Diagnosis not present

## 2024-01-05 NOTE — Progress Notes (Signed)
 Daily Session Note  Patient Details  Name: Sparkle Aube MRN: [id number] Date of Birth: 1965/05/16 Referring Provider:   Flowsheet Row Pulmonary Rehab from 12/15/2023 in [facility] Cardiac and Pulmonary Rehab  Referring Provider Ezra Shuck    Encounter Date: 01/05/2024  Check In:  Session Check In - 01/05/24 1108       Check-In   Supervising physician immediately available to respond to emergencies See telemetry face sheet for immediately available ER MD    Location [facility]-Cardiac & Pulmonary Rehab    Staff Present Burnard Davenport RN,BSN,MPA;Meredith Tressa RN,BSN;Maxon Conetta BS, Exercise Physiologist;Jason Elnor RDN,LDN    Virtual Visit No    Medication changes reported     No    Fall or balance concerns reported    No    Tobacco Cessation No Change    Warm-up and Cool-down Performed on first and last piece of equipment    Resistance Training Performed Yes    VAD Patient? No    PAD/SET Patient? No      Pain Assessment   Currently in Pain? No/denies             Social History   Tobacco Use  Smoking Status Never  Smokeless Tobacco Never    Goals Met:  Proper associated with RPD/PD & O2 Sat Independence with exercise equipment Using PLB without cueing & demonstrates good technique Exercise tolerated well No report of concerns or symptoms today Strength training completed today  Goals Unmet:  Not Applicable  Comments: Pt able to follow exercise prescription today without complaint.  Will continue to monitor for progression.    Dr. Oneil Pinal is Medical Director for [facility] Cardiac Rehabilitation.  Dr. Fuad Aleskerov is Medical Director for [facility] Pulmonary Rehabilitation.

## 2024-01-05 NOTE — Telephone Encounter (Signed)
 Called the patient. She was initially very hesitant, but agreed to restart Winrevair  at 0.3 mg/kg (0.5 mL) if CBC is repeated and her Hgb continues to trend down from labs 4 weeks prior. Lab ordered placed, will follow up after labs are completed this week to ensure she restarts.

## 2024-01-05 NOTE — Progress Notes (Unsigned)
 Dr. Rolan would like to resume Winrevair  0.3 mg/kg, patient requested repeat CBC prior as she has only had 2 doses, stopping after her first full dose injection. Orders placed, will restart if CBC is stable this week.

## 2024-01-05 NOTE — Telephone Encounter (Signed)
 Order placed per dr. Mclean for blood work regarding Winrevair 

## 2024-01-06 ENCOUNTER — Other Ambulatory Visit: Payer: Self-pay

## 2024-01-06 DIAGNOSIS — H814 Vertigo of central origin: Secondary | ICD-10-CM | POA: Diagnosis not present

## 2024-01-06 NOTE — Progress Notes (Signed)
 Specialty Pharmacy Refill Coordination Note  Tonya Odonnell is a 58 y.o. female contacted today regarding refills of specialty medication(s) Ambrisentan  (LETAIRIS )   Patient requested Delivery   Delivery date: 01/11/24   Verified address: [address]   [place] [address]   Medication will be filled on: 01/08/24

## 2024-01-06 NOTE — Telephone Encounter (Signed)
 Requested medication (s) are due for refill today: yes  Requested medication (s) are on the active medication list: yes  Last refill:  04/09/23  Future visit scheduled: no  Notes to clinic:  historical medication     Requested Prescriptions  Pending Prescriptions Disp Refills   omeprazole  (PRILOSEC) 40 MG capsule [Pharmacy Med Name: OMEPRAZOLE  DR 40 MG CAPSULE] 90 capsule 4    Sig: TAKE 1 CAPSULE (40 MG TOTAL) BY MOUTH DAILY.     Gastroenterology: Proton Pump Inhibitors Failed - 01/06/2024 12:04 PM      Failed - Valid encounter within last 12 months    Recent Outpatient Visits           4 months ago Pulmonary hypertension, moderate to severe [facility])   [place] [facility] Valerio Melanie DASEN, NP       Future Appointments             In 1 week Leonce Katz, DO [facility] at [facility]

## 2024-01-07 ENCOUNTER — Encounter: Admitting: Emergency Medicine

## 2024-01-07 ENCOUNTER — Other Ambulatory Visit: Payer: Self-pay

## 2024-01-07 ENCOUNTER — Other Ambulatory Visit
Admission: RE | Admit: 2024-01-07 | Discharge: 2024-01-07 | Disposition: A | Source: Ambulatory Visit | Attending: Cardiology | Admitting: Cardiology

## 2024-01-07 ENCOUNTER — Ambulatory Visit (HOSPITAL_COMMUNITY): Payer: Self-pay | Admitting: Cardiology

## 2024-01-07 DIAGNOSIS — I2721 Secondary pulmonary arterial hypertension: Secondary | ICD-10-CM | POA: Diagnosis not present

## 2024-01-07 DIAGNOSIS — I5022 Chronic systolic (congestive) heart failure: Secondary | ICD-10-CM | POA: Diagnosis not present

## 2024-01-07 DIAGNOSIS — Z7689 Persons encountering health services in other specified circumstances: Secondary | ICD-10-CM | POA: Diagnosis not present

## 2024-01-07 DIAGNOSIS — H814 Vertigo of central origin: Secondary | ICD-10-CM | POA: Diagnosis not present

## 2024-01-07 LAB — CBC
HCT: 47.7 % — ABNORMAL HIGH (ref 36.0–46.0)
Hemoglobin: 16 g/dL — ABNORMAL HIGH (ref 12.0–15.0)
MCH: 28.1 pg (ref 26.0–34.0)
MCHC: 33.5 g/dL (ref 30.0–36.0)
MCV: 83.7 fL (ref 80.0–100.0)
Platelets: 327 K/uL (ref 150–400)
RBC: 5.7 MIL/uL — ABNORMAL HIGH (ref 3.87–5.11)
RDW: 14.1 % (ref 11.5–15.5)
WBC: 6.8 K/uL (ref 4.0–10.5)
nRBC: 0 % (ref 0.0–0.2)

## 2024-01-07 NOTE — Progress Notes (Signed)
 Daily Session Note  Patient Details  Name: Tonya Odonnell MRN: [id number] Date of Birth: [birth year] Referring Provider:   Flowsheet Row Pulmonary Rehab from 12/15/2023 in [facility] Cardiac and Pulmonary Rehab  Referring Provider Ezra Shuck    Encounter Date: 01/07/2024  Check In:  Session Check In - 01/07/24 0934       Check-In   Supervising physician immediately available to respond to emergencies See telemetry face sheet for immediately available ER MD    Location [facility]-Cardiac & Pulmonary Rehab    Staff Present Leita Franks RN,BSN;[facility] RCP,RRT,BSRT;Margaret Best, MS, Exercise Physiologist    Virtual Visit No    Medication changes reported     No    Fall or balance concerns reported    No    Tobacco Cessation No Change    Warm-up and Cool-down Performed on first and last piece of equipment    Resistance Training Performed Yes    VAD Patient? No    PAD/SET Patient? No      Pain Assessment   Currently in Pain? No/denies             Social History   Tobacco Use  Smoking Status Never  Smokeless Tobacco Never    Goals Met:  Proper associated with RPD/PD & O2 Sat Independence with exercise equipment Using PLB without cueing & demonstrates good technique Exercise tolerated well No report of concerns or symptoms today Strength training completed today  Goals Unmet:  Not Applicable  Comments: Pt able to follow exercise prescription today without complaint.  Will continue to monitor for progression.    Dr. Oneil Pinal is Medical Director for [facility] Cardiac Rehabilitation.  Dr. Fuad Aleskerov is Medical Director for [facility] Pulmonary Rehabilitation.

## 2024-01-08 ENCOUNTER — Telehealth: Payer: Self-pay | Admitting: Pharmacist

## 2024-01-08 DIAGNOSIS — I272 Pulmonary hypertension, unspecified: Secondary | ICD-10-CM

## 2024-01-08 MED ORDER — WINREVAIR 45 MG ~~LOC~~ KIT: PACK | SUBCUTANEOUS | 6 refills | Status: DC

## 2024-01-08 NOTE — Telephone Encounter (Signed)
 Repeat labs are stable, patient can resume Winrevair  at 0.3 mg/kg dose. Patient was called with no response. LVM to call back.

## 2024-01-08 NOTE — Telephone Encounter (Signed)
 Spoke with patient who has agreed to restart Sotatercept at 0.3 mg/kg. She will call [facility] to arrange administration by nursing.A new prescription has been sent.

## 2024-01-11 DIAGNOSIS — Z7689 Persons encountering health services in other specified circumstances: Secondary | ICD-10-CM | POA: Diagnosis not present

## 2024-01-11 DIAGNOSIS — H814 Vertigo of central origin: Secondary | ICD-10-CM | POA: Diagnosis not present

## 2024-01-11 NOTE — Progress Notes (Deleted)
 Ben Jackson D.[facility]MENTE[facility] [address] [place] [address] Phone: [phone]   Assessment and Plan:     ***    Pertinent previous records reviewed include ***   Follow Up: ***     Subjective:   I, Tonya Odonnell, am serving as a neurosurgeon for Doctor Morene Mace   Chief Complaint: back pain    HPI:    11/30/2023 Patient is a 58 year old female with back pain. Patient states pain for 23 years. Hx of epidural when giving birth. Pain increased over the past couple of years. Has pain when sitting and standing. She is not able to do ADL. Pain is in the thoracic area. No meds for the pain . When she is at rest pain is relieved. No numbness or tingling. Hx of fibromyalgia from swine flu vaccine. Hx of breast cancer and pulmonary hypertension. Has tried theragun and she is screaming from the pain.   01/18/2024 Patient states   Relevant Historical Information: Pulmonary hypertension, GERD, prediabetes  Additional pertinent review of systems negative.   Current Outpatient Medications:    ambrisentan  (LETAIRIS ) 10 MG tablet, Take 1 tablet (10 mg total) by mouth daily., Disp: 30 tablet, Rfl: 11   Azelaic Acid  15 % gel, Apply to face every morning, Disp: 50 g, Rfl: 5   bismuth subsalicylate (PEPTO BISMOL) 262 MG/15ML suspension, Take 30 mLs by mouth every 6 (six) hours as needed for diarrhea or loose stools., Disp: , Rfl:    clobetasol  cream (TEMOVATE ) 0.05 %, Apply 1 Application topically as needed., Disp: 30 g, Rfl: 4   cromolyn  (OPTICROM ) 4 % ophthalmic solution, Place 1 drop into both eyes 4 (four) times daily., Disp: 10 mL, Rfl: 4   ELIDEL  1 % cream, Apply to red scaly areas on the face BID for seb derm, Disp: 30 g, Rfl: 4   fluticasone  (FLONASE ) 50 MCG/ACT nasal spray, Place 2 sprays into both nostrils daily as needed for allergies or rhinitis., Disp: , Rfl:    furosemide  (LASIX ) 40 MG tablet, Take 1 tablet (40 mg total) by mouth  daily., Disp: 90 tablet, Rfl: 1   hydrALAZINE  (APRESOLINE ) 25 MG tablet, Take 1 tablet (25 mg total) by mouth 2 (two) times daily., Disp: 180 tablet, Rfl: 1   Ivermectin  (SOOLANTRA ) 1 % CREA, Apply 1 Application topically at bedtime., Disp: 45 g, Rfl: 5   levothyroxine  (SYNTHROID ) 112 MCG tablet, Take 1 pill everyday except Sunday take half a pill, Disp: 90 tablet, Rfl: 3   meclizine  (ANTIVERT ) 25 MG tablet, Take 1 tablet (25 mg total) by mouth 3 (three) times daily as needed for dizziness., Disp: 30 tablet, Rfl: 0   meloxicam  (MOBIC ) 15 MG tablet, Take 1 tablet (15 mg total) by mouth daily., Disp: 21 tablet, Rfl: 0   omeprazole  (PRILOSEC) 40 MG capsule, TAKE 1 CAPSULE (40 MG TOTAL) BY MOUTH DAILY., Disp: 90 capsule, Rfl: 4   ondansetron  (ZOFRAN ) 4 MG tablet, Take 1 tablet (4 mg total) by mouth every 8 (eight) hours as needed for nausea or vomiting., Disp: 15 tablet, Rfl: 0   potassium chloride  SA (KLOR-CON  M) 20 MEQ tablet, Take 1 tablet (20 mEq total) by mouth daily., Disp: 90 tablet, Rfl: 1   Selexipag  800 MCG TABS, Take 1 tablet (800 mcg total) by mouth 2 (two) times daily., Disp: 60 tablet, Rfl: 11   sotatercept (WINREVAIR ) subcutaneous injection, Inject 0.5 mL (0.3 mg/kg) subcutaneously every 21 days, Disp: 1 kit,  Rfl: 6   tadalafil , PAH, (ADCIRCA ) 20 MG tablet, TAKE 2 TABLETS (40 MG TOTAL) BY MOUTH DAILY., Disp: 180 tablet, Rfl: 3   valACYclovir  (VALTREX ) 1000 MG tablet, Take 1,000 mg by mouth daily as needed (Cold Sores)., Disp: , Rfl:    Objective:     There were no vitals filed for this visit.    There is no height or weight on file to calculate BMI.    Physical Exam:    ***   Electronically signed by:  Odis Mace D.[facility] 7:50 AM 01/11/24

## 2024-01-12 ENCOUNTER — Encounter: Attending: Cardiology | Admitting: *Deleted

## 2024-01-12 DIAGNOSIS — I2721 Secondary pulmonary arterial hypertension: Secondary | ICD-10-CM | POA: Insufficient documentation

## 2024-01-12 DIAGNOSIS — I272 Pulmonary hypertension, unspecified: Secondary | ICD-10-CM | POA: Diagnosis not present

## 2024-01-12 NOTE — Progress Notes (Signed)
 Daily Session Note  Patient Details  Name: Tonya Odonnell MRN: [id number] Date of Birth: September 03, 1965 Referring Provider:   Flowsheet Row Pulmonary Rehab from 12/15/2023 in [facility] Cardiac and Pulmonary Rehab  Referring Provider Ezra Shuck    Encounter Date: 01/12/2024  Check In:  Session Check In - 01/12/24 1318       Check-In   Supervising physician immediately available to respond to emergencies See telemetry face sheet for immediately available ER MD    Location [facility]-Cardiac & Pulmonary Rehab    Staff Present Ronal Idell Glen, RN, BSN, MA;Maxon Conetta BS, Exercise Physiologist;Meredith Tressa RN,BSN;Jason Elnor [facility]    Virtual Visit No    Medication changes reported     No    Fall or balance concerns reported    No    Tobacco Cessation No Change    Warm-up and Cool-down Performed on first and last piece of equipment    Resistance Training Performed Yes    VAD Patient? No    PAD/SET Patient? No             Social History   Tobacco Use  Smoking Status Never  Smokeless Tobacco Never    Goals Met:  Independence with exercise equipment Exercise tolerated well No report of concerns or symptoms today  Goals Unmet:  Not Applicable  Comments: Pt able to follow exercise prescription today without complaint.  Will continue to monitor for progression.    Dr. Oneil Pinal is Medical Director for [facility] Cardiac Rehabilitation.  Dr. Fuad Aleskerov is Medical Director for [facility] Pulmonary Rehabilitation.

## 2024-01-13 DIAGNOSIS — H814 Vertigo of central origin: Secondary | ICD-10-CM | POA: Diagnosis not present

## 2024-01-14 ENCOUNTER — Encounter: Admitting: Emergency Medicine

## 2024-01-14 DIAGNOSIS — I2721 Secondary pulmonary arterial hypertension: Secondary | ICD-10-CM | POA: Diagnosis not present

## 2024-01-14 DIAGNOSIS — I272 Pulmonary hypertension, unspecified: Secondary | ICD-10-CM | POA: Diagnosis not present

## 2024-01-14 DIAGNOSIS — H814 Vertigo of central origin: Secondary | ICD-10-CM | POA: Diagnosis not present

## 2024-01-14 NOTE — Progress Notes (Signed)
 Daily Session Note  Patient Details  Name: Tonya Odonnell MRN: [id number] Date of Birth: 09-Sep-1965 Referring Provider:   Flowsheet Row Pulmonary Rehab from 12/15/2023 in [facility] Cardiac and Pulmonary Rehab  Referring Provider Ezra Shuck    Encounter Date: 01/14/2024  Check In:  Session Check In - 01/14/24 1003       Check-In   Supervising physician immediately available to respond to emergencies See telemetry face sheet for immediately available ER MD    Location [facility]-Cardiac & Pulmonary Rehab    Staff Present Leita Franks RN,BSN;Maxon Burnell BS, Exercise Physiologist;Joseph Rolinda NORWOOD HARMAN Tsosie Peggi, RN, DNP, NE-BC    Virtual Visit No    Medication changes reported     No    Fall or balance concerns reported    No    Tobacco Cessation No Change    Warm-up and Cool-down Performed on first and last piece of equipment    Resistance Training Performed Yes    VAD Patient? No    PAD/SET Patient? No      Pain Assessment   Currently in Pain? No/denies             Social History   Tobacco Use  Smoking Status Never  Smokeless Tobacco Never    Goals Met:  Proper associated with RPD/PD & O2 Sat Independence with exercise equipment Using PLB without cueing & demonstrates good technique Exercise tolerated well No report of concerns or symptoms today Strength training completed today  Goals Unmet:  Not Applicable  Comments: Pt able to follow exercise prescription today without complaint.  Will continue to monitor for progression.    Dr. Oneil Pinal is Medical Director for [facility] Cardiac Rehabilitation.  Dr. Fuad Aleskerov is Medical Director for [facility] Pulmonary Rehabilitation.

## 2024-01-18 ENCOUNTER — Ambulatory Visit: Admitting: Sports Medicine

## 2024-01-18 DIAGNOSIS — Z7689 Persons encountering health services in other specified circumstances: Secondary | ICD-10-CM | POA: Diagnosis not present

## 2024-01-19 ENCOUNTER — Encounter

## 2024-01-20 DIAGNOSIS — H814 Vertigo of central origin: Secondary | ICD-10-CM | POA: Diagnosis not present

## 2024-01-21 ENCOUNTER — Encounter

## 2024-01-21 DIAGNOSIS — H814 Vertigo of central origin: Secondary | ICD-10-CM | POA: Diagnosis not present

## 2024-01-25 DIAGNOSIS — H814 Vertigo of central origin: Secondary | ICD-10-CM | POA: Diagnosis not present

## 2024-01-26 ENCOUNTER — Encounter: Admitting: *Deleted

## 2024-01-26 DIAGNOSIS — I2721 Secondary pulmonary arterial hypertension: Secondary | ICD-10-CM

## 2024-01-26 DIAGNOSIS — I272 Pulmonary hypertension, unspecified: Secondary | ICD-10-CM

## 2024-01-26 DIAGNOSIS — Z7689 Persons encountering health services in other specified circumstances: Secondary | ICD-10-CM | POA: Diagnosis not present

## 2024-01-26 NOTE — Progress Notes (Signed)
 Daily Session Note  Patient Details  Name: Tonya Odonnell MRN: [id number] Date of Birth: 1966/03/07 Referring Provider:   Flowsheet Row Pulmonary Rehab from 12/15/2023 in [facility] Cardiac and Pulmonary Rehab  Referring Provider Ezra Shuck    Encounter Date: 01/26/2024  Check In:  Session Check In - 01/26/24 1202       Check-In   Staff Present Selinda Pereyra RDN,LDN;Maxon Conetta BS, Exercise Physiologist;Kelly Bollinger RN,BSN,MPA;Meredith Tressa RN,BSN;Jakaylee Sasaki, RN, DNP, NE-BC    Virtual Visit No    Medication changes reported     No    Fall or balance concerns reported    No    Warm-up and Cool-down Performed on first and last piece of equipment    Resistance Training Performed Yes    VAD Patient? No    PAD/SET Patient? No      Pain Assessment   Currently in Pain? No/denies             Social History   Tobacco Use  Smoking Status Never  Smokeless Tobacco Never    Goals Met:  Proper associated with RPD/PD & O2 Sat Independence with exercise equipment Using PLB without cueing & demonstrates good technique Exercise tolerated well No report of concerns or symptoms today Strength training completed today  Goals Unmet:  Not Applicable  Comments: Pt able to follow exercise prescription today without complaint.  Will continue to monitor for progression.    Dr. Oneil Pinal is Medical Director for [facility] Cardiac Rehabilitation.  Dr. Fuad Aleskerov is Medical Director for [facility] Pulmonary Rehabilitation.

## 2024-01-27 ENCOUNTER — Other Ambulatory Visit (HOSPITAL_COMMUNITY): Payer: Self-pay

## 2024-01-27 DIAGNOSIS — I2721 Secondary pulmonary arterial hypertension: Secondary | ICD-10-CM

## 2024-01-27 DIAGNOSIS — I272 Pulmonary hypertension, unspecified: Secondary | ICD-10-CM

## 2024-01-27 DIAGNOSIS — H814 Vertigo of central origin: Secondary | ICD-10-CM | POA: Diagnosis not present

## 2024-01-27 NOTE — Progress Notes (Signed)
 Pulmonary Individual Treatment Plan  Patient Details  Name: Tonya Odonnell MRN: [id number] Date of Birth: [birth year] Referring Provider:   Flowsheet Row Pulmonary Rehab from 12/15/2023 in [facility] Cardiac and Pulmonary Rehab  Referring Provider Ezra Shuck    Initial Encounter Date:  Flowsheet Row Pulmonary Rehab from 12/15/2023 in [facility] Cardiac and Pulmonary Rehab  Date 12/15/23    Visit Diagnosis: Pulmonary artery hypertension (HCC)  Pulmonary hypertension (HCC)  Patient's Home Medications on Admission:  Current Outpatient Medications:    ambrisentan  (LETAIRIS ) 10 MG tablet, Take 1 tablet (10 mg total) by mouth daily., Disp: 30 tablet, Rfl: 11   Azelaic Acid  15 % gel, Apply to face every morning, Disp: 50 g, Rfl: 5   bismuth subsalicylate (PEPTO BISMOL) 262 MG/15ML suspension, Take 30 mLs by mouth every 6 (six) hours as needed for diarrhea or loose stools., Disp: , Rfl:    clobetasol  cream (TEMOVATE ) 0.05 %, Apply 1 Application topically as needed., Disp: 30 g, Rfl: 4   cromolyn  (OPTICROM ) 4 % ophthalmic solution, Place 1 drop into both eyes 4 (four) times daily., Disp: 10 mL, Rfl: 4   ELIDEL  1 % cream, Apply to red scaly areas on the face BID for seb derm, Disp: 30 g, Rfl: 4   fluticasone  (FLONASE ) 50 MCG/ACT nasal spray, Place 2 sprays into both nostrils daily as needed for allergies or rhinitis., Disp: , Rfl:    furosemide  (LASIX ) 40 MG tablet, Take 1 tablet (40 mg total) by mouth daily., Disp: 90 tablet, Rfl: 1   hydrALAZINE  (APRESOLINE ) 25 MG tablet, Take 1 tablet (25 mg total) by mouth 2 (two) times daily., Disp: 180 tablet, Rfl: 1   Ivermectin  (SOOLANTRA ) 1 % CREA, Apply 1 Application topically at bedtime., Disp: 45 g, Rfl: 5   levothyroxine  (SYNTHROID ) 112 MCG tablet, Take 1 pill everyday except Sunday take half a pill, Disp: 90 tablet, Rfl: 3   meclizine  (ANTIVERT ) 25 MG tablet, Take 1 tablet (25 mg total) by mouth 3 (three) times daily as needed for dizziness., Disp: 30 tablet,  Rfl: 0   meloxicam  (MOBIC ) 15 MG tablet, Take 1 tablet (15 mg total) by mouth daily., Disp: 21 tablet, Rfl: 0   omeprazole  (PRILOSEC) 40 MG capsule, TAKE 1 CAPSULE (40 MG TOTAL) BY MOUTH DAILY., Disp: 90 capsule, Rfl: 4   ondansetron  (ZOFRAN ) 4 MG tablet, Take 1 tablet (4 mg total) by mouth every 8 (eight) hours as needed for nausea or vomiting., Disp: 15 tablet, Rfl: 0   potassium chloride  SA (KLOR-CON  M) 20 MEQ tablet, Take 1 tablet (20 mEq total) by mouth daily., Disp: 90 tablet, Rfl: 1   Selexipag  800 MCG TABS, Take 1 tablet (800 mcg total) by mouth 2 (two) times daily., Disp: 60 tablet, Rfl: 11   sotatercept (WINREVAIR ) subcutaneous injection, Inject 0.5 mL (0.3 mg/kg) subcutaneously every 21 days, Disp: 1 kit, Rfl: 6   tadalafil , PAH, (ADCIRCA ) 20 MG tablet, TAKE 2 TABLETS (40 MG TOTAL) BY MOUTH DAILY., Disp: 180 tablet, Rfl: 3   valACYclovir  (VALTREX ) 1000 MG tablet, Take 1,000 mg by mouth daily as needed (Cold Sores)., Disp: , Rfl:   Past Medical History: Past Medical History:  Diagnosis Date   Anemia    USE TO HAVE IT,NOT NOW,2015,2016   Arthritis    HIPS,KNEES,HANDS   Breast cancer in female (HCC)    Cancer (HCC)    LEFT   Hyperlipidemia    Hypertension    Low iron    Neuromuscular disorder (HCC)  MUSCLE PAIN,FIBROMYALGIA   Osteopenia    Pulmonary HTN (HCC)    PVC (premature ventricular contraction)    Thyroid  disease     Tobacco Use: Social History   Tobacco Use  Smoking Status Never  Smokeless Tobacco Never    Labs: Review Flowsheet  More data exists      Latest Ref Rng & Units 12/12/2022 12/22/2022 07/02/2023 08/19/2023 08/28/2023  Labs for ITP Cardiac and Pulmonary Rehab  Cholestrol 100 - 199 mg/dL - - - 723  -  LDL (calc) 0 - 99 mg/dL - - - 814  -  HDL-C >60 mg/dL - - - 73  -  Trlycerides 0 - 149 mg/dL - - - 892  -  Hemoglobin A1c 4.8 - 5.6 % - 5.7  - 5.2  -  PH, Arterial 7.35 - 7.45 7.399  - - - -  PCO2 arterial 32 - 48 mmHg 35.2  - - - -   Bicarbonate 20.0 - 28.0 mmol/L 21.8  24.0  - 23.9  23.5  - -  TCO2 22 - 32 mmol/L 23  25  - 25  25  - 22   Acid-base deficit 0.0 - 2.0 mmol/L 2.0  1.0  - 1.0  2.0  - -  O2 Saturation % 94  66  - 71  71  - -    Details       Multiple values from one day are sorted in reverse-chronological order          Pulmonary Assessment Scores:  Pulmonary Assessment Scores     Row Name 12/15/23 1506         ADL UCSD   ADL Phase Entry     SOB Score total 95     Rest 4     Walk 4     Stairs 4     Bath 4     Dress 3     Shop 4       CAT Score   CAT Score 28       mMRC Score   mMRC Score 4        UCSD: Self-administered rating of dyspnea associated with activities of daily living (ADLs) 6-point scale (0 = not at all to 5 = maximal or unable to do because of breathlessness)  Scoring Scores range from 0 to 120.  Minimally important difference is 5 units  CAT: CAT can identify the health impairment of COPD patients and is better correlated with disease progression.  CAT has a scoring range of zero to 40. The CAT score is classified into four groups of low (less than 10), medium (10 - 20), high (21-30) and very high (31-40) based on the impact level of disease on health status. A CAT score over 10 suggests significant symptoms.  A worsening CAT score could be explained by an exacerbation, poor medication adherence, poor inhaler technique, or progression of COPD or comorbid conditions.  CAT MCID is 2 points  mMRC: mMRC (Modified Medical Research Council) Dyspnea Scale is used to assess the degree of baseline functional disability in patients of respiratory disease due to dyspnea. No minimal important difference is established. A decrease in score of 1 point or greater is considered a positive change.   Pulmonary Function Assessment:   Exercise Target Goals: Exercise Program Goal: Individual exercise prescription set using results from initial 6 min walk test and THRR while  considering  patient's activity barriers and safety.   Exercise Prescription Goal: Initial  exercise prescription builds to 30-45 minutes a day of aerobic activity, 2-3 days per week.  Home exercise guidelines will be given to patient during program as part of exercise prescription that the participant will acknowledge.  Education: Aerobic Exercise: - Group verbal and visual presentation on the components of exercise prescription. Introduces F.I.T.T principle from ACSM for exercise prescriptions.  Reviews F.I.T.T. principles of aerobic exercise including progression. Written material provided at class time.   Education: Resistance Exercise: - Group verbal and visual presentation on the components of exercise prescription. Introduces F.I.T.T principle from ACSM for exercise prescriptions  Reviews F.I.T.T. principles of resistance exercise including progression. Written material provided at class time.    Education: Exercise & Equipment Safety: - Individual verbal instruction and demonstration of equipment use and safety with use of the equipment. Flowsheet Row Pulmonary Rehab from 12/15/2023 in [facility] Cardiac and Pulmonary Rehab  Date 12/15/23  Educator [facility]  Instruction Review Code 1- Verbalizes Understanding    Education: Exercise Physiology & General Exercise Guidelines: - Group verbal and written instruction with models to review the exercise physiology of the cardiovascular system and associated critical values. Provides general exercise guidelines with specific guidelines to those with heart or lung disease.    Education: Flexibility, Balance, Mind/Body Relaxation: - Group verbal and visual presentation with interactive activity on the components of exercise prescription. Introduces F.I.T.T principle from ACSM for exercise prescriptions. Reviews F.I.T.T. principles of flexibility and balance exercise training including progression. Also discusses the mind body connection.  Reviews various  relaxation techniques to help reduce and manage stress (i.e. Deep breathing, progressive muscle relaxation, and visualization). Balance handout provided to take home. Written material provided at class time.   Activity Barriers & Risk Stratification:  Activity Barriers & Cardiac Risk Stratification - 12/15/23 1459       Activity Barriers & Cardiac Risk Stratification   Activity Barriers Back Problems;Fibromyalgia;Other (comment)    Comments Vertigo          6 Minute Walk:  6 Minute Walk     Row Name 12/15/23 1456         6 Minute Walk   Phase Initial     Distance 1220 feet     Walk Time 6 minutes     # of Rest Breaks 0     MPH 2.3     METS 3.34     RPE 7     Perceived Dyspnea  1     VO2 Peak 11.7     Symptoms No     Resting HR 97 bpm     Resting BP 134/80     Resting Oxygen Saturation  93 %     Exercise Oxygen Saturation  during 6 min walk 92 %     Max Ex. HR 111 bpm     Max Ex. BP 132/82     2 Minute Post BP 124/80       Interval HR   1 Minute HR 97     2 Minute HR 115     3 Minute HR 101     4 Minute HR 104     5 Minute HR 111     6 Minute HR 109     2 Minute Post HR 100     Interval Heart Rate? Yes       Interval Oxygen   Interval Oxygen? Yes     Baseline Oxygen Saturation % 93 %     1 Minute Oxygen Saturation %  93 %     1 Minute Liters of Oxygen 0 L     2 Minute Oxygen Saturation % 92 %     2 Minute Liters of Oxygen 0 L     3 Minute Oxygen Saturation % 94 %     3 Minute Liters of Oxygen 0 L     4 Minute Oxygen Saturation % 96 %     4 Minute Liters of Oxygen 0 L     5 Minute Oxygen Saturation % 93 %     5 Minute Liters of Oxygen 0 L     6 Minute Oxygen Saturation % 95 %     6 Minute Liters of Oxygen 0 L     2 Minute Post Oxygen Saturation % 96 %     2 Minute Post Liters of Oxygen 0 L       Oxygen Initial Assessment:  Oxygen Initial Assessment - 12/10/23 1414       Home Oxygen   Home Oxygen Device None    Sleep Oxygen Prescription None     Home Exercise Oxygen Prescription None    Home Resting Oxygen Prescription None      Intervention   Short Term Goals To learn and understand importance of maintaining oxygen saturations>88%;To learn and understand importance of monitoring SPO2 with pulse oximeter and demonstrate accurate use of the pulse oximeter.;To learn and demonstrate proper use of respiratory medications;To learn and demonstrate proper pursed lip breathing techniques or other breathing techniques.     Long  Term Goals Exhibits proper breathing techniques, such as pursed lip breathing or other method taught during program session;Maintenance of O2 saturations>88%;Verbalizes importance of monitoring SPO2 with pulse oximeter and return demonstration;Demonstrates proper use of MDI's;Compliance with respiratory medication          Oxygen Re-Evaluation:  Oxygen Re-Evaluation     Row Name 12/17/23 0921 01/26/24 1259           Program Oxygen Prescription   Program Oxygen Prescription -- None        Home Oxygen   Home Oxygen Device -- None      Sleep Oxygen Prescription -- None      Home Exercise Oxygen Prescription -- None      Home Resting Oxygen Prescription -- None      Compliance with Home Oxygen Use -- Yes        Goals/Expected Outcomes   Short Term Goals -- To learn and understand importance of maintaining oxygen saturations>88%;To learn and understand importance of monitoring SPO2 with pulse oximeter and demonstrate accurate use of the pulse oximeter.      Long  Term Goals -- Exhibits proper breathing techniques, such as pursed lip breathing or other method taught during program session;Maintenance of O2 saturations>88%;Verbalizes importance of monitoring SPO2 with pulse oximeter and return demonstration      Comments Reviewed PLB technique with pt.  Talked about how it works and it's importance in maintaining their exercise saturations. Tonya Odonnell has been monitoring her pulse ox when she does not feel the best. She  states that if she monitored it frequently, it would cause more stress. She is aware of her shortness of breath symptoms and manages it with pursed lip breathing when possible. She notes that she feels like her oxygen saturation in class has been better than when she first started. Encouraged patient to monitor pulse ox when feeling well to establish a baseline      Goals/Expected Outcomes Short: Become  more profiecient at using PLB.   Long: Become independent at using PLB. Short: monitor pulse ox while in class and at home to establish a baseline. Long: independenlty manage shortness of breath         Oxygen Discharge (Final Oxygen Re-Evaluation):  Oxygen Re-Evaluation - 01/26/24 1259       Program Oxygen Prescription   Program Oxygen Prescription None      Home Oxygen   Home Oxygen Device None    Sleep Oxygen Prescription None    Home Exercise Oxygen Prescription None    Home Resting Oxygen Prescription None    Compliance with Home Oxygen Use Yes      Goals/Expected Outcomes   Short Term Goals To learn and understand importance of maintaining oxygen saturations>88%;To learn and understand importance of monitoring SPO2 with pulse oximeter and demonstrate accurate use of the pulse oximeter.    Long  Term Goals Exhibits proper breathing techniques, such as pursed lip breathing or other method taught during program session;Maintenance of O2 saturations>88%;Verbalizes importance of monitoring SPO2 with pulse oximeter and return demonstration    Comments Tonya Odonnell has been monitoring her pulse ox when she does not feel the best. She states that if she monitored it frequently, it would cause more stress. She is aware of her shortness of breath symptoms and manages it with pursed lip breathing when possible. She notes that she feels like her oxygen saturation in class has been better than when she first started. Encouraged patient to monitor pulse ox when feeling well to establish a baseline     Goals/Expected Outcomes Short: monitor pulse ox while in class and at home to establish a baseline. Long: independenlty manage shortness of breath          Initial Exercise Prescription:  Initial Exercise Prescription - 12/15/23 1400       Date of Initial Exercise RX and Referring Provider   Date 12/15/23    Referring Provider Ezra Shuck      Oxygen   Maintain Oxygen Saturation 88% or higher      Recumbant Bike   Level 3    RPM 50    Watts 25    Minutes 15    METs 3.3      NuStep   Level 3    SPM 80    Minutes 15    METs 3.3      Arm Ergometer   Level 3    Watts 25    RPM 50    Minutes 15    METs 3.3      REL-XR   Level 3    Watts 25    Speed 50    Minutes 15    METs 3.3      Prescription Details   Duration Progress to 30 minutes of continuous aerobic without signs/symptoms of physical distress      Intensity   THRR 40-80% of Max Heartrate 123-149    Ratings of Perceived Exertion 11-13    Perceived Dyspnea 0-4      Progression   Progression Continue to progress workloads to maintain intensity without signs/symptoms of physical distress.      Resistance Training   Training Prescription Yes    Weight 3lb    Reps 10-15          Perform Capillary Blood Glucose checks as needed.  Exercise Prescription Changes:   Exercise Prescription Changes     Row Name 12/15/23 1500 12/22/23 1400 01/21/24 1100  Response to Exercise   Blood Pressure (Admit) 134/80 102/64 108/70     Blood Pressure (Exercise) 132/82 108/68 134/66     Blood Pressure (Exit) 124/80 100/58 100/60     Heart Rate (Admit) 97 bpm 104 bpm 113 bpm     Heart Rate (Exercise) 115 bpm 141 bpm 128 bpm     Heart Rate (Exit) 100 bpm 106 bpm 110 bpm     Oxygen Saturation (Admit) 93 % 90 % 93 %     Oxygen Saturation (Exercise) 92 % 95 % 90 %     Oxygen Saturation (Exit) 96 % 96 % 93 %     Rating of Perceived Exertion (Exercise) 7 14 15      Perceived Dyspnea (Exercise) 1 1 1       Symptoms none none none     Comments results 1st day of exercise --     Duration -- Progress to 30 minutes of  aerobic without signs/symptoms of physical distress Progress to 30 minutes of  aerobic without signs/symptoms of physical distress     Intensity -- THRR unchanged THRR unchanged       Progression   Progression -- Continue to progress workloads to maintain intensity without signs/symptoms of physical distress. Continue to progress workloads to maintain intensity without signs/symptoms of physical distress.     Average METs -- 3.1 2.66       Resistance Training   Training Prescription -- Yes Yes     Weight -- 3lb 3lb     Reps -- 10-15 10-15       Interval Training   Interval Training -- No No       Recumbant Bike   Level -- 3 2     Watts -- 32 22     Minutes -- 15 15     METs -- -- 2.2       NuStep   Level -- 2 3     Minutes -- 15 15     METs -- 3.1 3.1       Arm Ergometer   Level -- -- 2     Watts -- -- 25     Minutes -- -- 15     METs -- -- 2.62       REL-XR   Level -- -- 3     Minutes -- -- 15     METs -- -- 4.2       Oxygen   Maintain Oxygen Saturation 88% or higher 88% or higher 88% or higher        Exercise Comments:   Exercise Comments     Row Name 12/17/23 0919           Exercise Comments First full day of exercise!  Patient was oriented to gym and equipment including functions, settings, policies, and procedures.  Patient's individual exercise prescription and treatment plan were reviewed.  All starting workloads were established based on the results of the 6 minute walk test done at initial orientation visit.  The plan for exercise progression was also introduced and progression will be customized based on patient's performance and goals.          Exercise Goals and Review:   Exercise Goals     Row Name 12/15/23 1505             Exercise Goals   Increase Physical Activity Yes       Intervention Provide advice, education,  support and counseling about  physical activity/exercise needs.;Develop an individualized exercise prescription for aerobic and resistive training based on initial evaluation findings, risk stratification, comorbidities and participant's personal goals.       Expected Outcomes Short Term: Attend rehab on a regular basis to increase amount of physical activity.;Long Term: Add in home exercise to make exercise part of routine and to increase amount of physical activity.;Long Term: Exercising regularly at least 3-5 days a week.       Increase Strength and Stamina Yes       Intervention Provide advice, education, support and counseling about physical activity/exercise needs.;Develop an individualized exercise prescription for aerobic and resistive training based on initial evaluation findings, risk stratification, comorbidities and participant's personal goals.       Expected Outcomes Short Term: Increase workloads from initial exercise prescription for resistance, speed, and METs.;Short Term: Perform resistance training exercises routinely during rehab and add in resistance training at home;Long Term: Improve cardiorespiratory fitness, muscular endurance and strength as measured by increased METs and functional capacity ( )       Able to understand and use rate of perceived exertion (RPE) scale Yes       Intervention Provide education and explanation on how to use RPE scale       Expected Outcomes Short Term: Able to use RPE daily in rehab to express subjective intensity level;Long Term:  Able to use RPE to guide intensity level when exercising independently       Able to understand and use Dyspnea scale Yes       Intervention Provide education and explanation on how to use Dyspnea scale       Expected Outcomes Short Term: Able to use Dyspnea scale daily in rehab to express subjective sense of shortness of breath during exertion;Long Term: Able to use Dyspnea scale to guide intensity level when exercising  independently       Knowledge and understanding of Target Heart Rate Range (THRR) Yes       Intervention Provide education and explanation of THRR including how the numbers were predicted and where they are located for reference       Expected Outcomes Short Term: Able to state/look up THRR;Short Term: Able to use daily as guideline for intensity in rehab;Long Term: Able to use THRR to govern intensity when exercising independently       Able to check pulse independently Yes       Intervention Provide education and demonstration on how to check pulse in carotid and radial arteries.;Review the importance of being able to check your own pulse for safety during independent exercise       Expected Outcomes Short Term: Able to explain why pulse checking is important during independent exercise;Long Term: Able to check pulse independently and accurately       Understanding of Exercise Prescription Yes       Intervention Provide education, explanation, and written materials on patient's individual exercise prescription       Expected Outcomes Short Term: Able to explain program exercise prescription;Long Term: Able to explain home exercise prescription to exercise independently          Exercise Goals Re-Evaluation :  Exercise Goals Re-Evaluation     Row Name 12/17/23 9078 12/22/23 1453 01/07/24 1637 01/21/24 1102       Exercise Goal Re-Evaluation   Exercise Goals Review Increase Physical Activity;Able to understand and use rate of perceived exertion (RPE) scale;Knowledge and understanding of Target Heart Rate Range (THRR);Understanding of Exercise Prescription;Increase Strength and  Stamina;Able to understand and use Dyspnea scale;Able to check pulse independently Increase Physical Activity;Understanding of Exercise Prescription;Increase Strength and Stamina Increase Physical Activity;Understanding of Exercise Prescription;Increase Strength and Stamina Increase Physical Activity;Understanding of  Exercise Prescription;Increase Strength and Stamina    Comments Reviewed RPE and dyspnea scale, THR and program prescription with pt today.  Pt voiced understanding and was given a copy of goals to take home. Tonya Odonnell is off to a good start in the program and completed her first day in this review. She worked at level 3 on the recumbent bike with 32 watts and level 2 on the T4 nustep. We will continue to monitor her progress in the program. Tonya Odonnell has not attended since 12/17/23. We will be in touch with her to determine when she will return. Tonya Odonnell is doing well in rehab. She was recently able to increase from level 2 to 3 on the T4 nustep, as well as maintain level 3 on the XR. We will continue to monitor her progress in the program.    Expected Outcomes Short: Use RPE daily to regulate intensity.  Long: Follow program prescription in THR. Short: Continue to follow current exercise prescription. Long: Continue exercise to improve strength and stamina. Short: Return to rehab. Long: Continue exercise to improve strength and stamina. Short: Continue to follow exercise prescription. Long: Continue exercise to improve strength and stamina.       Discharge Exercise Prescription (Final Exercise Prescription Changes):  Exercise Prescription Changes - 01/21/24 1100       Response to Exercise   Blood Pressure (Admit) 108/70    Blood Pressure (Exercise) 134/66    Blood Pressure (Exit) 100/60    Heart Rate (Admit) 113 bpm    Heart Rate (Exercise) 128 bpm    Heart Rate (Exit) 110 bpm    Oxygen Saturation (Admit) 93 %    Oxygen Saturation (Exercise) 90 %    Oxygen Saturation (Exit) 93 %    Rating of Perceived Exertion (Exercise) 15    Perceived Dyspnea (Exercise) 1    Symptoms none    Duration Progress to 30 minutes of  aerobic without signs/symptoms of physical distress    Intensity THRR unchanged      Progression   Progression Continue to progress workloads to maintain intensity without signs/symptoms of  physical distress.    Average METs 2.66      Resistance Training   Training Prescription Yes    Weight 3lb    Reps 10-15      Interval Training   Interval Training No      Recumbant Bike   Level 2    Watts 22    Minutes 15    METs 2.2      NuStep   Level 3    Minutes 15    METs 3.1      Arm Ergometer   Level 2    Watts 25    Minutes 15    METs 2.62      REL-XR   Level 3    Minutes 15    METs 4.2      Oxygen   Maintain Oxygen Saturation 88% or higher          Nutrition:  Target Goals: Understanding of nutrition guidelines, daily intake of sodium 1500mg , cholesterol 200mg , calories 30% from fat and 7% or less from saturated fats, daily to have 5 or more servings of fruits and vegetables.  Education: Nutrition 1 -Group instruction provided by verbal, written material,  interactive activities, discussions, models, and posters to present general guidelines for heart healthy nutrition including macronutrients, label reading, and promoting whole foods over processed counterparts. Education serves as pensions consultant of discussion of heart healthy eating for all. Written material provided at class time.     Education: Nutrition 2 -Group instruction provided by verbal, written material, interactive activities, discussions, models, and posters to present general guidelines for heart healthy nutrition including sodium, cholesterol, and saturated fat. Providing guidance of habit forming to improve blood pressure, cholesterol, and body weight. Written material provided at class time.     Biometrics:  Pre Biometrics - 12/15/23 1505       Pre Biometrics   Height 5' 3.5 (1.613 m)    Weight 176 lb 3.2 oz (79.9 kg)    Waist Circumference 38 inches    Hip Circumference 42 inches    Waist to Hip Ratio 0.9 %    BMI (Calculated) 30.72    Single Leg Stand 29.75 seconds           Nutrition Therapy Plan and Nutrition Goals:  Nutrition Therapy & Goals - 01/26/24 1302        Nutrition Therapy   RD appointment deferred Yes          Nutrition Assessments:  MEDIFICTS Score Key: >=70 Need to make dietary changes  40-70 Heart Healthy Diet <= 40 Therapeutic Level Cholesterol Diet   Picture Your Plate Scores: <59 Unhealthy dietary pattern with much room for improvement. 41-50 Dietary pattern unlikely to meet recommendations for good health and room for improvement. 51-60 More healthful dietary pattern, with some room for improvement.  >60 Healthy dietary pattern, although there may be some specific behaviors that could be improved.   Nutrition Goals Re-Evaluation:   Nutrition Goals Discharge (Final Nutrition Goals Re-Evaluation):   Psychosocial: Target Goals: Acknowledge presence or absence of significant depression and/or stress, maximize coping skills, provide positive support system. Participant is able to verbalize types and ability to use techniques and skills needed for reducing stress and depression.   Education: Stress, Anxiety, and Depression - Group verbal and visual presentation to define topics covered.  Reviews how body is impacted by stress, anxiety, and depression.  Also discusses healthy ways to reduce stress and to treat/manage anxiety and depression.  Written material provided at class time.   Education: Sleep Hygiene -Provides group verbal and written instruction about how sleep can affect your health.  Define sleep hygiene, discuss sleep cycles and impact of sleep habits. Review good sleep hygiene tips.    Initial Review & Psychosocial Screening:  Initial Psych Review & Screening - 12/10/23 1408       Initial Review   Current issues with None Identified      Family Dynamics   Good Support System? Yes   son, ex-husband, social worker   Comments Tonya Odonnell states that she previously attended pulmonary rehab, however she had to discharge early due to difficulty with veritgo. She reports that these symptoms are more well controlled at  this time, however she should avoid the treadmill and walking in circles. Tonya Odonnell states that she is seeing a spine specialist due to chronic back issues and that they are talking about physical therapy to improve pain and mobility, however this has not been set up at this time. She denies any mental health concerns and states that she has a good support system, consisting of her son and ex-husband. Tonya Odonnell does want to take advantage of the dietician services to help balance  her diet.      Barriers   Psychosocial barriers to participate in program There are no identifiable barriers or psychosocial needs.;The patient should benefit from training in stress management and relaxation.      Screening Interventions   Interventions Encouraged to exercise;To provide support and resources with identified psychosocial needs;Provide feedback about the scores to participant    Expected Outcomes Short Term goal: Utilizing psychosocial counselor, staff and physician to assist with identification of specific Stressors or current issues interfering with healing process. Setting desired goal for each stressor or current issue identified.;Long Term Goal: Stressors or current issues are controlled or eliminated.;Short Term goal: Identification and review with participant of any Quality of Life or Depression concerns found by scoring the questionnaire.;Long Term goal: The participant improves quality of Life and PHQ9 Scores as seen by post scores and/or verbalization of changes          Quality of Life Scores:  Scores of 19 and below usually indicate a poorer quality of life in these areas.  A difference of  2-3 points is a clinically meaningful difference.  A difference of 2-3 points in the total score of the Quality of Life Index has been associated with significant improvement in overall quality of life, self-image, physical symptoms, and general health in studies assessing change in quality of life.  PHQ-9: Review  Flowsheet  More data exists      01/26/2024 12/15/2023 07/22/2023 12/10/2022 06/17/2021  Depression screen PHQ 2/9  Decreased Interest 0 0 0 1 1 0  Down, Depressed, Hopeless 0 0 0 1 1 0  PHQ - 2 Score 0 0 0 2 2 0  Altered sleeping 0 0 0 3 3 2   Tired, decreased energy 3 3 3 3 3 3   Change in appetite 1 2 2 1 2  0  Feeling bad or failure about yourself  0 0 0 -- 0 1  Trouble concentrating 0 1 0 0 0 1  Moving slowly or fidgety/restless 0 0 0 0 1 0  Suicidal thoughts 0 0 0 -- 0 0  PHQ-9 Score 4 6  5  9  11  7    Difficult doing work/chores Somewhat difficult - - Very difficult Very difficult -    Details       Data saved with a previous flowsheet row definition   Multiple values from one day are sorted in reverse-chronological order        Interpretation of Total Score  Total Score Depression Severity:  1-4 = Minimal depression, 5-9 = Mild depression, 10-14 = Moderate depression, 15-19 = Moderately severe depression, 20-27 = Severe depression   Psychosocial Evaluation and Intervention:  Psychosocial Evaluation - 12/10/23 1412       Psychosocial Evaluation & Interventions   Interventions Encouraged to exercise with the program and follow exercise prescription;Relaxation education;Stress management education    Comments Tonya Odonnell states that she previously attended pulmonary rehab, however she had to discharge early due to difficulty with veritgo. She reports that these symptoms are more well controlled at this time, however she should avoid the treadmill and walking in circles. Matalyn states that she is seeing a spine specialist due to chronic back issues and that they are talking about physical therapy to improve pain and mobility, however this has not been set up at this time. She denies any mental health concerns and states that she has a good support system, consisting of her son and ex-husband. Aries does want to take advantage of  the dietician services to help balance her diet.    Expected  Outcomes Short: Attend pulmonary rehab for education and exercise. Long: Develop and maintain positive self care habits.    Continue Psychosocial Services  Follow up required by staff          Psychosocial Re-Evaluation:  Psychosocial Re-Evaluation     Row Name 01/26/24 1303             Psychosocial Re-Evaluation   Current issues with Current Stress Concerns       Comments Tonya Odonnell is struggling with her health concerns and states she feels like she can't get her doctors to respond to her. Her new symptoms she is discussing with her doctors come and go, with no answers as to what might be causing it. She was encouraged to go to the doctor's office since she hasn't been able to contact them via phone or MyChart. The rheumatologist wants to run more tests, but her appointment isnt for another couple months. She mentions she has been dealing with poor health for a while and she feels like there is no hope when it comes to her feeling better. Encouraged exercising and talk therapy.       Expected Outcomes Short: go to her doctor's office in person to ask questions Long: independently manage positive stress reduction techniques       Interventions Encouraged to attend Pulmonary Rehabilitation for the exercise       Continue Psychosocial Services  Follow up required by staff          Psychosocial Discharge (Final Psychosocial Re-Evaluation):  Psychosocial Re-Evaluation - 01/26/24 1303       Psychosocial Re-Evaluation   Current issues with Current Stress Concerns    Comments Tonya Odonnell is struggling with her health concerns and states she feels like she can't get her doctors to respond to her. Her new symptoms she is discussing with her doctors come and go, with no answers as to what might be causing it. She was encouraged to go to the doctor's office since she hasn't been able to contact them via phone or MyChart. The rheumatologist wants to run more tests, but her appointment isnt for another couple  months. She mentions she has been dealing with poor health for a while and she feels like there is no hope when it comes to her feeling better. Encouraged exercising and talk therapy.    Expected Outcomes Short: go to her doctor's office in person to ask questions Long: independently manage positive stress reduction techniques    Interventions Encouraged to attend Pulmonary Rehabilitation for the exercise    Continue Psychosocial Services  Follow up required by staff          Education: Education Goals: Education classes will be provided on a weekly basis, covering required topics. Participant will state understanding/return demonstration of topics presented.  Learning Barriers/Preferences:  Learning Barriers/Preferences - 12/10/23 1407       Learning Barriers/Preferences   Learning Barriers None    Learning Preferences None          General Pulmonary Education Topics:  Infection Prevention: - Provides verbal and written material to individual with discussion of infection control including proper hand washing and proper equipment cleaning during exercise session. Flowsheet Row Pulmonary Rehab from 12/15/2023 in [facility] Pulmonary Rehab  Date 12/15/23  Educator [facility]  Instruction Review Code 1- Verbalizes Understanding    Falls Prevention: - Provides verbal and written material to individual with discussion of  falls prevention and safety. Flowsheet Row Pulmonary Rehab from 12/15/2023 in [facility] Cardiac and Pulmonary Rehab  Date 12/15/23  Educator [facility]  Instruction Review Code 1- Verbalizes Understanding    Chronic Lung Disease Review: - Group verbal instruction with posters, models, PowerPoint presentations and videos,  to review new updates, new respiratory medications, new advancements in procedures and treatments. Providing information on websites and 800 numbers for continued self-education. Includes information about supplement oxygen, available portable oxygen systems,  continuous and intermittent flow rates, oxygen safety, concentrators, and Medicare reimbursement for oxygen. Explanation of Pulmonary Drugs, including class, frequency, complications, importance of spacers, rinsing mouth after steroid MDI's, and proper cleaning methods for nebulizers. Review of basic lung anatomy and physiology related to function, structure, and complications of lung disease. Review of risk factors. Discussion about methods for diagnosing sleep apnea and types of masks and machines for OSA. Includes a review of the use of types of environmental controls: home humidity, furnaces, filters, dust mite/pet prevention, HEPA vacuums. Discussion about weather changes, air quality and the benefits of nasal washing. Instruction on Warning signs, infection symptoms, calling MD promptly, preventive modes, and value of vaccinations. Review of effective airway clearance, coughing and/or vibration techniques. Emphasizing that all should Create an Action Plan. Written material provided at class time.   AED/CPR: - Group verbal and written instruction with the use of models to demonstrate the basic use of the AED with the basic ABC's of resuscitation.    Tests and Procedures:  - Group verbal and visual presentation and models provide information about basic cardiac anatomy and function. Reviews the testing methods done to diagnose heart disease and the outcomes of the test results. Describes the treatment choices: Medical Management, Angioplasty, or Coronary Bypass Surgery for treating various heart conditions including Myocardial Infarction, Angina, Valve Disease, and Cardiac Arrhythmias.  Written material provided at class time.   Medication Safety: - Group verbal and visual instruction to review commonly prescribed medications for heart and lung disease. Reviews the medication, class of the drug, and side effects. Includes the steps to properly store meds and maintain the prescription regimen.   Written material given at graduation.   Other: -Provides group and verbal instruction on various topics (see comments)   Knowledge Questionnaire Score:    Core Components/Risk Factors/Patient Goals at Admission:  Personal Goals and Risk Factors at Admission - 12/10/23 1406       Core Components/Risk Factors/Patient Goals on Admission    Weight Management Yes;Weight Loss    Intervention Weight Management: Develop a combined nutrition and exercise program designed to reach desired caloric intake, while maintaining appropriate intake of nutrient and fiber, sodium and fats, and appropriate energy expenditure required for the weight goal.;Weight Management: Provide education and appropriate resources to help participant work on and attain dietary goals.;Weight Management/Obesity: Establish reasonable short term and long term weight goals.;Obesity: Provide education and appropriate resources to help participant work on and attain dietary goals.    Goal Weight: Short Term 155 lb (70.3 kg)    Expected Outcomes Short Term: Continue to assess and modify interventions until short term weight is achieved;Weight Loss: Understanding of general recommendations for a balanced deficit meal plan, which promotes 1-2 lb weight loss per week and includes a negative energy balance of [phone] kcal/d;Understanding recommendations for meals to include 15-35% energy as protein, 25-35% energy from fat, 35-60% energy from carbohydrates, less than 200mg  of dietary cholesterol, 20-35 gm of total fiber daily;Understanding of distribution of calorie intake throughout the day with  the consumption of 4-5 meals/snacks;Long Term: Adherence to nutrition and physical activity/exercise program aimed toward attainment of established weight goal    Improve shortness of breath with ADL's Yes    Intervention Provide education, individualized exercise plan and daily activity instruction to help decrease symptoms of SOB with activities of  daily living.    Expected Outcomes Short Term: Improve cardiorespiratory fitness to achieve a reduction of symptoms when performing ADLs;Long Term: Be able to perform more ADLs without symptoms or delay the onset of symptoms    Hypertension Yes    Intervention Provide education on lifestyle modifcations including regular physical activity/exercise, weight management, moderate sodium restriction and increased consumption of fresh fruit, vegetables, and low fat dairy, alcohol moderation, and smoking cessation.;Monitor prescription use compliance.    Expected Outcomes Short Term: Continued assessment and intervention until BP is < 140/8mm HG in hypertensive participants. < 130/63mm HG in hypertensive participants with diabetes, heart failure or chronic kidney disease.;Long Term: Maintenance of blood pressure at goal levels.    Lipids Yes    Intervention Provide education and support for participant on nutrition & aerobic/resistive exercise along with prescribed medications to achieve LDL 70mg , HDL >40mg .    Expected Outcomes Long Term: Cholesterol controlled with medications as prescribed, with individualized exercise RX and with personalized nutrition plan. Value goals: LDL < 70mg , HDL > 40 mg.;Short Term: Participant states understanding of desired cholesterol values and is compliant with medications prescribed. Participant is following exercise prescription and nutrition guidelines.          Education:Diabetes - Individual verbal and written instruction to review signs/symptoms of diabetes, desired ranges of glucose level fasting, after meals and with exercise. Acknowledge that pre and post exercise glucose checks will be done for 3 sessions at entry of program.   Know Your Numbers and Heart Failure: - Group verbal and visual instruction to discuss disease risk factors for cardiac and pulmonary disease and treatment options.  Reviews associated critical values for Overweight/Obesity,  Hypertension, Cholesterol, and Diabetes.  Discusses basics of heart failure: signs/symptoms and treatments.  Introduces Heart Failure Zone chart for action plan for heart failure. Written material provided at class time.   Core Components/Risk Factors/Patient Goals Review:   Goals and Risk Factor Review     Row Name 01/26/24 1307             Core Components/Risk Factors/Patient Goals Review   Personal Goals Review Weight Management/Obesity;Hypertension;Lipids;Improve shortness of breath with ADL's       Review Tonya Odonnell states she has been sick the last few sessions so she is returning not feeling the strongest. She is unsure of the cause of her symptoms, but was pleased her oxygen sat and heart rate were good. Her blood pressure was stable in the 120s/60s. Her concern is her weight and she is thinking it could be fluid. She takes a daily fluid pill and was encouraged to weight herself daily to help manage fluid balance. She continues to take all her medication as prescribed, she is not currently on medicaiton for lipid management but is using her diet and exercise as managment.       Expected Outcomes Short: weigh daily and track symptoms Long: independenlty manage risk factors          Core Components/Risk Factors/Patient Goals at Discharge (Final Review):   Goals and Risk Factor Review - 01/26/24 1307       Core Components/Risk Factors/Patient Goals Review   Personal Goals Review Weight Management/Obesity;Hypertension;Lipids;Improve shortness of  breath with ADL's    Review Tonya Odonnell states she has been sick the last few sessions so she is returning not feeling the strongest. She is unsure of the cause of her symptoms, but was pleased her oxygen sat and heart rate were good. Her blood pressure was stable in the 120s/60s. Her concern is her weight and she is thinking it could be fluid. She takes a daily fluid pill and was encouraged to weight herself daily to help manage fluid balance. She continues  to take all her medication as prescribed, she is not currently on medicaiton for lipid management but is using her diet and exercise as managment.    Expected Outcomes Short: weigh daily and track symptoms Long: independenlty manage risk factors          ITP Comments:  ITP Comments     Row Name 12/10/23 1350 12/15/23 1456 12/17/23 0919 12/30/23 1038 01/27/24 1151   ITP Comments Initial phone call completed. Diagnosis can be found in [facility] 11/24/2023. EP Orientation scheduled for Tuesday, 12/15/2023. Completed and gym orientation for pulmonary rehab. Initial ITP created and sent for review to Dr. Fuad Aleskerov, Medical Director. First full day of exercise!  Patient was oriented to gym and equipment including functions, settings, policies, and procedures.  Patient's individual exercise prescription and treatment plan were reviewed.  All starting workloads were established based on the results of the 6 minute walk test done at initial orientation visit.  The plan for exercise progression was also introduced and progression will be customized based on patient's performance and goals. 30 Day review completed. Medical Director ITP review done, changes made as directed, and signed approval by Medical Director. 30 Day review completed. Medical Director ITP review done, changes made as directed, and signed approval by Medical Director.      Comments: 30 day review ITP

## 2024-01-28 ENCOUNTER — Ambulatory Visit

## 2024-01-28 DIAGNOSIS — H814 Vertigo of central origin: Secondary | ICD-10-CM | POA: Diagnosis not present

## 2024-02-01 DIAGNOSIS — H814 Vertigo of central origin: Secondary | ICD-10-CM | POA: Diagnosis not present

## 2024-02-02 ENCOUNTER — Encounter

## 2024-02-02 DIAGNOSIS — I2721 Secondary pulmonary arterial hypertension: Secondary | ICD-10-CM

## 2024-02-02 DIAGNOSIS — I272 Pulmonary hypertension, unspecified: Secondary | ICD-10-CM | POA: Diagnosis not present

## 2024-02-02 DIAGNOSIS — Z7689 Persons encountering health services in other specified circumstances: Secondary | ICD-10-CM | POA: Diagnosis not present

## 2024-02-02 NOTE — Telephone Encounter (Signed)
 Per [facility], patient tolerated 0.3 dose well. Repeat CBC put in to obtain prior to next dose. Patient did not agree to come in for labs next week, but will obtain labs at next visit with Dr. Rolan on 02/16/24.

## 2024-02-02 NOTE — Progress Notes (Signed)
 Daily Session Note  Patient Details  Name: Tonya Odonnell MRN: [id number] Date of Birth: Jan 08, 1966 Referring Provider:   Flowsheet Row Pulmonary Rehab from 12/15/2023 in [facility] Cardiac and Pulmonary Rehab  Referring Provider Ezra Shuck    Encounter Date: 02/02/2024  Check In:  Session Check In - 02/02/24 9072       Check-In   Supervising physician immediately available to respond to emergencies See telemetry face sheet for immediately available ER MD    Location [facility]-Cardiac & Pulmonary Rehab    Staff Present Burnard Davenport RN,BSN,MPA;Maxon Conetta BS, Exercise Physiologist;Margaret Best, MS, Exercise Physiologist;Jason Elnor RDN,LDN    Virtual Visit No    Medication changes reported     No    Fall or balance concerns reported    No    Tobacco Cessation No Change    Warm-up and Cool-down Performed on first and last piece of equipment    Resistance Training Performed Yes    VAD Patient? No    PAD/SET Patient? No      Pain Assessment   Currently in Pain? No/denies             Social History   Tobacco Use  Smoking Status Never  Smokeless Tobacco Never    Goals Met:  Proper associated with RPD/PD & O2 Sat Independence with exercise equipment Using PLB without cueing & demonstrates good technique Exercise tolerated well No report of concerns or symptoms today Strength training completed today  Goals Unmet:  Not Applicable  Comments: Pt able to follow exercise prescription today without complaint.  Will continue to monitor for progression.    Dr. Oneil Pinal is Medical Director for [facility] Cardiac Rehabilitation.  Dr. Fuad Aleskerov is Medical Director for [facility] Pulmonary Rehabilitation.

## 2024-02-02 NOTE — Addendum Note (Signed)
 Addended by: DELSIE JAUN RAMAN on: 02/02/2024 01:15 PM   Modules accepted: Orders

## 2024-02-03 DIAGNOSIS — H814 Vertigo of central origin: Secondary | ICD-10-CM | POA: Diagnosis not present

## 2024-02-05 ENCOUNTER — Other Ambulatory Visit (HOSPITAL_COMMUNITY): Payer: Self-pay

## 2024-02-06 DIAGNOSIS — H814 Vertigo of central origin: Secondary | ICD-10-CM | POA: Diagnosis not present

## 2024-02-08 DIAGNOSIS — H814 Vertigo of central origin: Secondary | ICD-10-CM | POA: Diagnosis not present

## 2024-02-09 ENCOUNTER — Other Ambulatory Visit (HOSPITAL_COMMUNITY): Payer: Self-pay

## 2024-02-09 ENCOUNTER — Other Ambulatory Visit: Payer: Self-pay

## 2024-02-09 ENCOUNTER — Encounter: Attending: Cardiology

## 2024-02-09 DIAGNOSIS — I2721 Secondary pulmonary arterial hypertension: Secondary | ICD-10-CM | POA: Diagnosis not present

## 2024-02-09 NOTE — Progress Notes (Signed)
 Specialty Pharmacy Refill Coordination Note  Tonya Odonnell is a 58 y.o. female contacted today regarding refills of specialty medication(s) Ambrisentan  (LETAIRIS )   Patient requested Delivery   Delivery date: 02/11/24   Verified address: [address]   [place] [id number]   Medication will be filled on: 02/10/24

## 2024-02-09 NOTE — Progress Notes (Signed)
 Daily Session Note  Patient Details  Name: Tonya Odonnell MRN: [id number] Date of Birth: 1965-06-17 Referring Provider:   Flowsheet Row Pulmonary Rehab from 12/15/2023 in [facility] Cardiac and Pulmonary Rehab  Referring Provider Ezra Shuck    Encounter Date: 02/09/2024  Check In:  Session Check In - 02/09/24 1115       Check-In   Supervising physician immediately available to respond to emergencies See telemetry face sheet for immediately available ER MD    Location [facility]-Cardiac & Pulmonary Rehab    Staff Present Burnard Davenport RN,BSN,MPA;Maxon Conetta BS, Exercise Physiologist;Noah Tickle, BS, Exercise Physiologist;Jason Elnor RDN,LDN;Meredith Tressa RN,BSN    Virtual Visit No    Medication changes reported     No    Fall or balance concerns reported    No    Tobacco Cessation No Change    Warm-up and Cool-down Performed on first and last piece of equipment    Resistance Training Performed Yes    VAD Patient? No    PAD/SET Patient? No      Pain Assessment   Currently in Pain? No/denies             Social History   Tobacco Use  Smoking Status Never  Smokeless Tobacco Never    Goals Met:  Proper associated with RPD/PD & O2 Sat Independence with exercise equipment Exercise tolerated well No report of concerns or symptoms today Strength training completed today  Goals Unmet:  Not Applicable  Comments: Pt able to follow exercise prescription today without complaint.  Will continue to monitor for progression.    Dr. Oneil Pinal is Medical Director for [facility] Cardiac Rehabilitation.  Dr. Fuad Aleskerov is Medical Director for [facility] Pulmonary Rehabilitation.

## 2024-02-10 ENCOUNTER — Telehealth (HOSPITAL_COMMUNITY): Payer: Self-pay | Admitting: Cardiology

## 2024-02-10 DIAGNOSIS — H814 Vertigo of central origin: Secondary | ICD-10-CM | POA: Diagnosis not present

## 2024-02-10 NOTE — Telephone Encounter (Signed)
 Called to confirm/remind patient of their appointment at the [facility] on 02/10/2024.   Appointment:   [x] Confirmed  [] Left mess   [] No answer/No voice mail  [] VM Full/unable to leave message  [] Phone not in service  Patient reminded to bring all medications and/or complete list.  Confirmed patient has transportation. Gave directions, instructed to utilize valet parking.

## 2024-02-11 ENCOUNTER — Ambulatory Visit (HOSPITAL_COMMUNITY): Payer: Self-pay | Admitting: Cardiology

## 2024-02-11 ENCOUNTER — Encounter: Admitting: Emergency Medicine

## 2024-02-11 ENCOUNTER — Ambulatory Visit (HOSPITAL_COMMUNITY)
Admission: RE | Admit: 2024-02-11 | Discharge: 2024-02-11 | Disposition: A | Source: Ambulatory Visit | Attending: Cardiology | Admitting: Cardiology

## 2024-02-11 VITALS — BP 100/84 | HR 116 | Wt 180.2 lb

## 2024-02-11 DIAGNOSIS — I272 Pulmonary hypertension, unspecified: Secondary | ICD-10-CM | POA: Diagnosis not present

## 2024-02-11 DIAGNOSIS — G4733 Obstructive sleep apnea (adult) (pediatric): Secondary | ICD-10-CM | POA: Diagnosis not present

## 2024-02-11 DIAGNOSIS — I2721 Secondary pulmonary arterial hypertension: Secondary | ICD-10-CM | POA: Diagnosis not present

## 2024-02-11 DIAGNOSIS — M2550 Pain in unspecified joint: Secondary | ICD-10-CM | POA: Diagnosis not present

## 2024-02-11 DIAGNOSIS — I5022 Chronic systolic (congestive) heart failure: Secondary | ICD-10-CM

## 2024-02-11 DIAGNOSIS — M797 Fibromyalgia: Secondary | ICD-10-CM | POA: Diagnosis not present

## 2024-02-11 DIAGNOSIS — Z7689 Persons encountering health services in other specified circumstances: Secondary | ICD-10-CM | POA: Diagnosis not present

## 2024-02-11 DIAGNOSIS — E039 Hypothyroidism, unspecified: Secondary | ICD-10-CM | POA: Diagnosis not present

## 2024-02-11 DIAGNOSIS — I110 Hypertensive heart disease with heart failure: Secondary | ICD-10-CM | POA: Diagnosis not present

## 2024-02-11 DIAGNOSIS — H814 Vertigo of central origin: Secondary | ICD-10-CM | POA: Diagnosis not present

## 2024-02-11 DIAGNOSIS — Z79899 Other long term (current) drug therapy: Secondary | ICD-10-CM | POA: Diagnosis not present

## 2024-02-11 LAB — BASIC METABOLIC PANEL WITH GFR
Anion gap: 8 (ref 5–15)
BUN: 15 mg/dL (ref 6–20)
CO2: 24 mmol/L (ref 22–32)
Calcium: 8.7 mg/dL — ABNORMAL LOW (ref 8.9–10.3)
Chloride: 106 mmol/L (ref 98–111)
Creatinine, Ser: 0.72 mg/dL (ref 0.44–1.00)
GFR, Estimated: >60 mL/min (ref >60)
Glucose, Bld: 104 mg/dL — ABNORMAL HIGH (ref 70–99)
Potassium: 3.5 mmol/L (ref 3.5–5.1)
Sodium: 138 mmol/L (ref 135–145)

## 2024-02-11 LAB — CBC
HCT: 47.3 % — ABNORMAL HIGH (ref 36.0–46.0)
Hemoglobin: 16 g/dL — ABNORMAL HIGH (ref 12.0–15.0)
MCH: 28.5 pg (ref 26.0–34.0)
MCHC: 33.8 g/dL (ref 30.0–36.0)
MCV: 84.2 fL (ref 80.0–100.0)
Platelets: 345 K/uL (ref 150–400)
RBC: 5.62 MIL/uL — ABNORMAL HIGH (ref 3.87–5.11)
RDW: 14.2 % (ref 11.5–15.5)
WBC: 6.8 K/uL (ref 4.0–10.5)
nRBC: 0 % (ref 0.0–0.2)

## 2024-02-11 LAB — BRAIN NATRIURETIC PEPTIDE: B Natriuretic Peptide: 5.2 pg/mL (ref 0.0–100.0)

## 2024-02-11 MED ORDER — UPTRAVI 1000 MCG PO TABS: 1000.0000 ug | ORAL_TABLET | Freq: Two times a day (BID) | ORAL | 11 refills | Status: DC

## 2024-02-11 NOTE — Progress Notes (Signed)
 [facility] NOTE  Primary Care: Valerio Melanie DASEN, NP HF Cardiology: Dr. Rolan  Chief Complaint: Pulmonary hypertension  HPI: Tonya Odonnell is a 58 y.o. female with pulmonary hypertension, hypertension, breast cancer status postmastectomy and chemotherapy in 2017 complicated by severe neuropathic/psychosomatic pain, hypothyroidism and fibromyalgia.  She was admitted 10/24 at [facility] with progressive  dyspnea as well as pain.  Prior to that she was seen by [facility] rheumatology and diagnosed with fibromyalgia.  2 months prior to her hospitalization she reports having progressive exertional dyspnea with shortness of breath just walking to her mailbox.  2 weeks prior to admission this progressed to chest tightness both at rest and with exertion.  During her hospitalization, echocardiogram was done showing EF of 50 to 55% with D-shaped interventricular septum.  She underwent right and left heart catheterization that was significant for pulmonary hypertension with a PVR of 7.3 Wood units.  CTA chest during this admission showed no PE, no parenchymal lung disease.  PFTs in 10/24 were surprisingly normal.  V/Q scan in 10/24 showed no evidence for chronic PE.  She was subsequently started on tadalafil  20 mg and PH evaluation was started.  Sleep study showed mild OSA, pulmonary did not recommend CPAP.   In 1/25, she developed appendicitis and had uncomplicated laparoscopic appendectomy at [facility]   Repeat RHC in 4/25 showed PA pressure 57/23 mean 35, CI 3.13, PVR 5.2 WU.  Echo in 6/25 showed EF 65-70%, mild RV dysfunction with normal size, IVC normal, unable to estimate PASP.   Patient was started on sotatercept but when dose increased to goal, hgb rose to 17.5.  Sotatercept was then decreased to 0.3 mg/kg.   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  She is no longer having diarrhea or abdominal pain with Uptravi . Main complaint today is pain all over,  involves joints and muscles.  She is being evaluated by her rheumatologist at [facility]. She is more limited by pain than dyspnea.  She is able to walk her dog on flat ground without dyspnea.  No orthopnea/PND.  No palpitations or syncope.  No chest pain. Weight up 2 lbs. She is doing pulmonary rehab.   Labs (2/24): CCP negative, RF negative Labs (10/24): BNP 42, anti-SCL 70 negative, ANA negative, HIV negative Labs (11/24): K 4, creatinine 0.88 Labs (1/25): K 3.6, creatinine 0.86, LFTs normal, hgb 13.5, BNP 8 Labs (3/25): TSH normal Labs (6/25): K 4.5, creatinine 0.81, hgb 15.6 Labs (9/25): hgb 17.5, K 3.7, creatinine 0.82, RF negative, CCP negative, anti-dsDNA positive.  Labs (10/25): hgb 16  ECG (personally reviewed): NSR, nonspecific T wave flattening  6 minute walk (12/24): 457 m 6 minute walk (3/25): 488 m 6 minute walk (7/25): 396 m 6 minute walk (12/25): 243 m (she had significant joint and muscle pain).   PMH: 1. Fibromyalgia: Has seen rheumatology at [facility].  2. HTN 3. Hypothyroidism 4. Breast cancer: 2017, Treated with chemotherapy and mastectomy.  5. Pulmonary hypertension: Echo (10/24) with EF 50-55%, D-shaped septum suggestive of RV pressure/volume overload, RV poorly visualized but appeared dilated and dysfunctional.   - RHC/LHC (10/24): No significant CAD; mean RA 11, PA 76/38 mean 52, mean PCWP 10, PVR 7.3 WU, CI 3.04 - V/Q scan (10/24): No evidence for chronic PE.  - CTA chest (10/24): No acute PE, no evidence for lung parenchymal disease.  - PFTs (10/24): FVC 92%, FEV1 102%, TLC 101%, DLCO normal (surprising).   - Sleep study (10/24): mild  OSA.  - CCP/RF negative. Anti-SCL70, HIV, and ANA negative.  - RHC (4/25): mean RA 5, PA 57/23 mean 35, mean PCWP 5, CI 3.13, PVR 5.2 WU - Echo (6/25): EF 65-70%, mild RV dysfunction with normal size, IVC normal, unable to estimate PASP.  6. Appendicitis: Appendectomy 1/25.  7. BPPV  SH: Nonsmoker, no ETOH, married, originally from  [place].   FH: No pulmonary hypertension or cardiomyopathy that she knows of.   ROS: All systems reviewed and negative except as per HPI.    Current Outpatient Medications  Medication Sig Dispense Refill   ambrisentan  (LETAIRIS ) 10 MG tablet Take 1 tablet (10 mg total) by mouth daily. 30 tablet 11   Azelaic Acid  15 % gel Apply to face every morning 50 g 5   bismuth subsalicylate (PEPTO BISMOL) 262 MG/15ML suspension Take 30 mLs by mouth every 6 (six) hours as needed for diarrhea or loose stools.     clobetasol  cream (TEMOVATE ) 0.05 % Apply 1 Application topically as needed. 30 g 4   cromolyn  (OPTICROM ) 4 % ophthalmic solution Place 1 drop into both eyes 4 (four) times daily. 10 mL 4   ELIDEL  1 % cream Apply to red scaly areas on the face BID for seb derm 30 g 4   fluticasone  (FLONASE ) 50 MCG/ACT nasal spray Place 2 sprays into both nostrils daily as needed for allergies or rhinitis.     furosemide  (LASIX ) 40 MG tablet Take 1 tablet (40 mg total) by mouth daily. 90 tablet 1   hydrALAZINE  (APRESOLINE ) 25 MG tablet Take 1 tablet (25 mg total) by mouth 2 (two) times daily. 180 tablet 1   Ivermectin  (SOOLANTRA ) 1 % CREA Apply 1 Application topically at bedtime. 45 g 5   levothyroxine  (SYNTHROID ) 112 MCG tablet Take 1 pill everyday except Sunday take half a pill 90 tablet 3   meclizine  (ANTIVERT ) 25 MG tablet Take 1 tablet (25 mg total) by mouth 3 (three) times daily as needed for dizziness. 30 tablet 0   meloxicam  (MOBIC ) 15 MG tablet Take 1 tablet (15 mg total) by mouth daily. 21 tablet 0   omeprazole  (PRILOSEC) 40 MG capsule TAKE 1 CAPSULE (40 MG TOTAL) BY MOUTH DAILY. 90 capsule 4   ondansetron  (ZOFRAN ) 4 MG tablet Take 1 tablet (4 mg total) by mouth every 8 (eight) hours as needed for nausea or vomiting. 15 tablet 0   potassium chloride  SA (KLOR-CON  M) 20 MEQ tablet Take 1 tablet (20 mEq total) by mouth daily. 90 tablet 1   Selexipag  (UPTRAVI ) 1000 MCG TABS Take 1,000 mcg by mouth 2 (two)  times daily. 60 tablet 11   sotatercept (WINREVAIR ) subcutaneous injection Inject 0.5 mL (0.3 mg/kg) subcutaneously every 21 days 1 kit 6   tadalafil , PAH, (ADCIRCA ) 20 MG tablet TAKE 2 TABLETS (40 MG TOTAL) BY MOUTH DAILY. 180 tablet 3   valACYclovir  (VALTREX ) 1000 MG tablet Take 1,000 mg by mouth daily as needed (Cold Sores).     No current facility-administered medications for this encounter.    Allergies  Allergen Reactions   Lisinopril Other (See Comments)    cough     PHYSICAL EXAM: BP 100/84   Pulse (!) 116   Wt 81.7 kg (180 lb 3.2 oz)   LMP  (LMP Unknown)   SpO2 92%   BMI 31.42 kg/m  General: NAD Neck: No JVD, no thyromegaly or thyroid  nodule.  Lungs: Clear to auscultation bilaterally with normal respiratory effort. CV: Nondisplaced PMI.  Heart regular S1/S2,  no S3/S4, no murmur.  No peripheral edema.  No carotid bruit.  Normal pedal pulses.  Abdomen: Soft, nontender, no hepatosplenomegaly, no distention.  Skin: Intact without lesions or rashes.  Neurologic: Alert and oriented x 3.  Psych: Normal affect. Extremities: No clubbing or cyanosis.  HEENT: Normal.   ASSESSMENT & PLAN:  1. Pulmonary hypertension/RV failure: Echo in 10/24 showed EF 50-55%; RV was poorly visualized but there was a D-shaped interventricular septum and concern for RV dilation/dysfunction.  RHC/LHC in 10/24 showed no CAD; mean RA 11, PA 76/38 mean 52, mean PWP 10, PVR 7.3 WU and CI 3.04.  PE CT 10/24 showed no acute PE and no parenchymal lung disease.  V/Q scan 10/24 showed no chronic PE.  PFTs in 10/24 were normal (including, surprisingly, DLCO).  Rheumatology serologies and HIV were negative.  Sleep study showed mild OSA.  No FH of pulmonary hypertension.  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 Repeat echo in 6/25 appeared to show improved RV function; EF 65-70%, mild RV dysfunction with normal size, IVC normal, unable to estimate PASP.  Repeat RHC in 4/25  showed moderate pulmonary arterial hypertension (improved from prior RHC) with preserved cardiac output.  NYHA class II-III, not volume overloaded by exam.  6 minute walk worse today but she is also having more muscle and joint pain.  - Continue Lasix  40 mg daily.  BMET/BNP today.   - Continue tadalafil  40 mg daily.  - Continue ambrisentan  10 mg daily.    - She thinks she can titrate up further on Uptravi .  I will increase it to 1000 mcg bid and then try to slowing continue to titrate up to 1600 mcg bid then reassess to see if we can go higher.  - She is now on sotatercept at lower dose 0.3 mg/kg due to rise in hgb. CBC today.  - Continue pulmonary rehab.  2. HTN: BP controlled, she is only taking hydralazine  25 mg bid.  3. Fibromyalgia: With diffuse pain.  Evaluation has not found a specific rheumatologic diagnosis.  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  She will be seeing her rheumatologist.   She will followup with me in 3 months.   I spent 32 minutes reviewing records, interviewing/examining patient, and managing order   Ezra Shuck 02/11/2024

## 2024-02-11 NOTE — Progress Notes (Signed)
 6 Min Walk Test Completed  Pt ambulated 800 ft (243.84 m) O2 Sat ranged 95-92 on 0 L oxygen HR ranged 99-133

## 2024-02-11 NOTE — Progress Notes (Signed)
 Daily Session Note  Patient Details  Name: Tonya Odonnell MRN: [id number] Date of Birth: May 31, 1965 Referring Provider:   Flowsheet Row Pulmonary Rehab from 12/15/2023 in [facility] Cardiac and Pulmonary Rehab  Referring Provider Ezra Shuck    Encounter Date: 02/11/2024  Check In:  Session Check In - 02/11/24 0933       Check-In   Supervising physician immediately available to respond to emergencies See telemetry face sheet for immediately available ER MD    Location [facility]-Cardiac & Pulmonary Rehab    Staff Present Leita Franks RN,BSN;[facility] BS, Exercise Physiologist;Laureen Delores, BS, RRT, CPFT    Virtual Visit No    Medication changes reported     No    Fall or balance concerns reported    No    Tobacco Cessation No Change    Warm-up and Cool-down Performed on first and last piece of equipment    Resistance Training Performed Yes    VAD Patient? No    PAD/SET Patient? No      Pain Assessment   Currently in Pain? No/denies             Social History   Tobacco Use  Smoking Status Never  Smokeless Tobacco Never    Goals Met:  Proper associated with RPD/PD & O2 Sat Independence with exercise equipment Using PLB without cueing & demonstrates good technique Exercise tolerated well No report of concerns or symptoms today Strength training completed today  Goals Unmet:  Not Applicable  Comments: Pt able to follow exercise prescription today without complaint.  Will continue to monitor for progression.    Dr. Oneil Pinal is Medical Director for [facility] Cardiac Rehabilitation.  Dr. Fuad Aleskerov is Medical Director for [facility] Pulmonary Rehabilitation.

## 2024-02-11 NOTE — Patient Instructions (Signed)
 CHANGE Uptravi  to 1,000 mcg Twice daily  Labs done today, your results will be available in MyChart, we will contact you for abnormal readings.  Your physician recommends that you schedule a follow-up appointment in: 3 months ( March 2026) ** PLEASE CALL THE OFFICE IN [place] TO ARRANGE YOUR FOLLOW UP APPOINTMENT.**  If you have any questions or concerns before your next appointment please send us  a message through [place] or call our office at [phone].    TO LEAVE A MESSAGE FOR THE NURSE SELECT OPTION 2, PLEASE LEAVE A MESSAGE INCLUDING: YOUR NAME DATE OF BIRTH CALL BACK NUMBER REASON FOR CALL**this is important as we prioritize the call backs  YOU WILL RECEIVE A CALL BACK THE SAME DAY AS LONG AS YOU CALL BEFORE 4:00 PM  At the [facility], you and your health needs are our priority. As part of our continuing mission to provide you with exceptional heart care, we have created designated Provider Care Teams. These Care Teams include your primary Cardiologist (physician) and Advanced Practice Providers (APPs- Physician Assistants and Nurse Practitioners) who all work together to provide you with the care you need, when you need it.   You may see any of the following providers on your designated Care Team at your next follow up: Dr Toribio Fuel Dr Ezra Shuck Dr. Morene Brownie Greig Mosses, NP Caffie Shed, [place] [facility] [place], [place] Beckey Coe, NP Jordan Lee, NP Ellouise Class, NP Tinnie Redman, PharmD Jaun Bash, PharmD   Please be sure to bring in all your medications bottles to every appointment.    Thank you for choosing [facility]

## 2024-02-15 ENCOUNTER — Telehealth: Payer: Self-pay | Admitting: Pharmacist

## 2024-02-15 DIAGNOSIS — H814 Vertigo of central origin: Secondary | ICD-10-CM | POA: Diagnosis not present

## 2024-02-15 NOTE — Telephone Encounter (Signed)
 Discussed Uptravi  titration parameters with [facility]. Patient was increased to 1000 mcg BID by Dr. Rolan and will titrate by 200 mcg per dose every month until target dose of 1600 mcg is achieved or highest tolerated dose.

## 2024-02-16 ENCOUNTER — Encounter: Admitting: Cardiology

## 2024-02-16 ENCOUNTER — Encounter

## 2024-02-16 DIAGNOSIS — I2721 Secondary pulmonary arterial hypertension: Secondary | ICD-10-CM

## 2024-02-16 DIAGNOSIS — H814 Vertigo of central origin: Secondary | ICD-10-CM | POA: Diagnosis not present

## 2024-02-16 DIAGNOSIS — Z7689 Persons encountering health services in other specified circumstances: Secondary | ICD-10-CM | POA: Diagnosis not present

## 2024-02-16 NOTE — Progress Notes (Signed)
 Daily Session Note  Patient Details  Name: Tonya Odonnell MRN: [id number] Date of Birth: August 21, 1965 Referring Provider:   Flowsheet Row Pulmonary Rehab from 12/15/2023 in [facility] Cardiac and Pulmonary Rehab  Referring Provider Ezra Shuck    Encounter Date: 02/16/2024  Check In:  Session Check In - 02/16/24 0912       Check-In   Supervising physician immediately available to respond to emergencies See telemetry face sheet for immediately available ER MD    Location [facility]-Cardiac & Pulmonary Rehab    Staff Present Burnard Davenport RN,BSN,MPA;Maxon Conetta BS, Exercise Physiologist;Margaret Best, MS, Exercise Physiologist;Noah Tickle, BS, Exercise Physiologist    Virtual Visit No    Medication changes reported     No    Fall or balance concerns reported    No    Tobacco Cessation No Change    Warm-up and Cool-down Performed on first and last piece of equipment    Resistance Training Performed Yes    VAD Patient? No    PAD/SET Patient? No      Pain Assessment   Currently in Pain? No/denies             Social History   Tobacco Use  Smoking Status Never  Smokeless Tobacco Never    Goals Met:  Proper associated with RPD/PD & O2 Sat Independence with exercise equipment Using PLB without cueing & demonstrates good technique Exercise tolerated well No report of concerns or symptoms today Strength training completed today  Goals Unmet:  Not Applicable  Comments: Pt able to follow exercise prescription today without complaint.  Will continue to monitor for progression.    Dr. Oneil Pinal is Medical Director for [facility] Cardiac Rehabilitation.  Dr. Fuad Aleskerov is Medical Director for [facility] Pulmonary Rehabilitation.

## 2024-02-17 DIAGNOSIS — H814 Vertigo of central origin: Secondary | ICD-10-CM | POA: Diagnosis not present

## 2024-02-18 ENCOUNTER — Other Ambulatory Visit (HOSPITAL_COMMUNITY): Payer: Self-pay | Admitting: Cardiology

## 2024-02-18 ENCOUNTER — Encounter

## 2024-02-20 DIAGNOSIS — Z7689 Persons encountering health services in other specified circumstances: Secondary | ICD-10-CM | POA: Diagnosis not present

## 2024-02-22 DIAGNOSIS — H814 Vertigo of central origin: Secondary | ICD-10-CM | POA: Diagnosis not present

## 2024-02-23 ENCOUNTER — Encounter

## 2024-02-23 DIAGNOSIS — I2721 Secondary pulmonary arterial hypertension: Secondary | ICD-10-CM | POA: Diagnosis not present

## 2024-02-23 DIAGNOSIS — Z7689 Persons encountering health services in other specified circumstances: Secondary | ICD-10-CM | POA: Diagnosis not present

## 2024-02-23 NOTE — Progress Notes (Signed)
 Daily Session Note  Patient Details  Name: Tonya Odonnell MRN: [id number] Date of Birth: [birth year] Referring Provider:   Flowsheet Row Pulmonary Rehab from 12/15/2023 in [facility] Cardiac and Pulmonary Rehab  Referring Provider Ezra Shuck    Encounter Date: 02/23/2024  Check In:  Session Check In - 02/23/24 1115       Check-In   Supervising physician immediately available to respond to emergencies See telemetry face sheet for immediately available ER MD    Location [facility]-Cardiac & Pulmonary Rehab    Staff Present Burnard Davenport RN,BSN,MPA;Meredith Tressa RN,BSN;Maxon Conetta BS, Exercise Physiologist;Jason Elnor RDN,LDN    Virtual Visit No    Medication changes reported     No    Fall or balance concerns reported    No    Tobacco Cessation No Change    Warm-up and Cool-down Performed on first and last piece of equipment    Resistance Training Performed Yes    VAD Patient? No    PAD/SET Patient? No      Pain Assessment   Currently in Pain? No/denies             Tobacco Use History[1]  Goals Met:  Proper associated with RPD/PD & O2 Sat Independence with exercise equipment Using PLB without cueing & demonstrates good technique Exercise tolerated well No report of concerns or symptoms today Strength training completed today  Goals Unmet:  Not Applicable  Comments: Pt able to follow exercise prescription today without complaint.  Will continue to monitor for progression.    Dr. Oneil Pinal is Medical Director for [facility] Cardiac Rehabilitation.  Dr. Fuad Aleskerov is Medical Director for [facility] Pulmonary Rehabilitation.    [1]  Social History Tobacco Use  Smoking Status Never  Smokeless Tobacco Never

## 2024-02-24 DIAGNOSIS — I272 Pulmonary hypertension, unspecified: Secondary | ICD-10-CM

## 2024-02-24 DIAGNOSIS — H814 Vertigo of central origin: Secondary | ICD-10-CM | POA: Diagnosis not present

## 2024-02-24 DIAGNOSIS — I2721 Secondary pulmonary arterial hypertension: Secondary | ICD-10-CM

## 2024-02-24 NOTE — Progress Notes (Signed)
 Pulmonary Individual Treatment Plan  Patient Details  Name: Tonya Odonnell MRN: [id number] Date of Birth: 1965-12-27 Referring Provider:   Conrad Ports Pulmonary Rehab from 12/15/2023 in [facility] Cardiac and Pulmonary Rehab  Referring Provider Ezra Shuck    Initial Encounter Date:  Flowsheet Row Pulmonary Rehab from 12/15/2023 in [facility] Cardiac and Pulmonary Rehab  Date 12/15/23    Visit Diagnosis: Pulmonary artery hypertension (HCC)  Pulmonary hypertension (HCC)  Patient's Home Medications on Admission: Current Medications[1]  Past Medical History: Past Medical History:  Diagnosis Date   Anemia    USE TO HAVE IT,NOT NOW,2015,2016   Arthritis    HIPS,KNEES,HANDS   Breast cancer in female [facility])    Cancer (HCC)    LEFT   Hyperlipidemia    Hypertension    Low iron    Neuromuscular disorder (HCC)    MUSCLE PAIN,FIBROMYALGIA   Osteopenia    Pulmonary HTN (HCC)    PVC (premature ventricular contraction)    Thyroid  disease     Tobacco Use: Tobacco Use History[2]  Labs: Review Flowsheet  More data exists      Latest Ref Rng & Units 12/12/2022 12/22/2022 07/02/2023 08/19/2023 08/28/2023  Labs for ITP Cardiac and Pulmonary Rehab  Cholestrol 100 - 199 mg/dL - - - 723  -  LDL (calc) 0 - 99 mg/dL - - - 814  -  HDL-C >60 mg/dL - - - 73  -  Trlycerides 0 - 149 mg/dL - - - 892  -  Hemoglobin A1c 4.8 - 5.6 % - 5.7  - 5.2  -  PH, Arterial 7.35 - 7.45 7.399  - - - -  PCO2 arterial 32 - 48 mmHg 35.2  - - - -  Bicarbonate 20.0 - 28.0 mmol/L 21.8  24.0  - 23.9  23.5  - -  TCO2 22 - 32 mmol/L 23  25  - 25  25  - 22   Acid-base deficit 0.0 - 2.0 mmol/L 2.0  1.0  - 1.0  2.0  - -  O2 Saturation % 94  66  - 71  71  - -    Details       Multiple values from one day are sorted in reverse-chronological order          Pulmonary Assessment Scores:  Pulmonary Assessment Scores     Row Name 12/15/23 1506         ADL UCSD   ADL Phase Entry     SOB Score total 95     Rest 4      Walk 4     Stairs 4     Bath 4     Dress 3     Shop 4       CAT Score   CAT Score 28       mMRC Score   mMRC Score 4        UCSD: Self-administered rating of dyspnea associated with activities of daily living (ADLs) 6-point scale (0 = not at all to 5 = maximal or unable to do because of breathlessness)  Scoring Scores range from 0 to 120.  Minimally important difference is 5 units  CAT: CAT can identify the health impairment of COPD patients and is better correlated with disease progression.  CAT Odonnell a scoring range of zero to 40. The CAT score is classified into four groups of low (less than 10), medium (10 - 20), high (21-30) and very high (31-40) based on the impact  level of disease on health status. A CAT score over 10 suggests significant symptoms.  A worsening CAT score could be explained by an exacerbation, poor medication adherence, poor inhaler technique, or progression of COPD or comorbid conditions.  CAT MCID is 2 points  mMRC: mMRC (Modified Medical Research Council) Dyspnea Scale is used to assess the degree of baseline functional disability in patients of respiratory disease due to dyspnea. No minimal important difference is established. A decrease in score of 1 point or greater is considered a positive change.   Pulmonary Function Assessment:   Exercise Target Goals: Exercise Program Goal: Individual exercise prescription set using results from initial 6 min walk test and THRR while considering  patients activity barriers and safety.   Exercise Prescription Goal: Initial exercise prescription builds to 30-45 minutes a day of aerobic activity, 2-3 days per week.  Home exercise guidelines will be given to patient during program as part of exercise prescription that the participant will acknowledge.  Education: Aerobic Exercise: - Group verbal and visual presentation on the components of exercise prescription. Introduces F.I.T.T principle from ACSM for exercise  prescriptions.  Reviews F.I.T.T. principles of aerobic exercise including progression. Written material provided at class time.   Education: Resistance Exercise: - Group verbal and visual presentation on the components of exercise prescription. Introduces F.I.T.T principle from ACSM for exercise prescriptions  Reviews F.I.T.T. principles of resistance exercise including progression. Written material provided at class time.    Education: Exercise & Equipment Safety: - Individual verbal instruction and demonstration of equipment use and safety with use of the equipment. Flowsheet Row Pulmonary Rehab from 12/15/2023 in [facility] Cardiac and Pulmonary Rehab  Date 12/15/23  Educator [facility]  Instruction Review Code 1- Verbalizes Understanding    Education: Exercise Physiology & General Exercise Guidelines: - Group verbal and written instruction with models to review the exercise physiology of the cardiovascular system and associated critical values. Provides general exercise guidelines with specific guidelines to those with heart or lung disease.    Education: Flexibility, Balance, Mind/Body Relaxation: - Group verbal and visual presentation with interactive activity on the components of exercise prescription. Introduces F.I.T.T principle from ACSM for exercise prescriptions. Reviews F.I.T.T. principles of flexibility and balance exercise training including progression. Also discusses the mind body connection.  Reviews various relaxation techniques to help reduce and manage stress (i.e. Deep breathing, progressive muscle relaxation, and visualization). Balance handout provided to take home. Written material provided at class time.   Activity Barriers & Risk Stratification:  Activity Barriers & Cardiac Risk Stratification - 12/15/23 1459       Activity Barriers & Cardiac Risk Stratification   Activity Barriers Back Problems;Fibromyalgia;Other (comment)    Comments Vertigo          6 Minute Walk:   6 Minute Walk     Row Name 12/15/23 1456         6 Minute Walk   Phase Initial     Distance 1220 feet     Walk Time 6 minutes     # of Rest Breaks 0     MPH 2.3     METS 3.34     RPE 7     Perceived Dyspnea  1     VO2 Peak 11.7     Symptoms No     Resting HR 97 bpm     Resting BP 134/80     Resting Oxygen Saturation  93 %     Exercise Oxygen Saturation  during 6  min walk 92 %     Max Ex. HR 111 bpm     Max Ex. BP 132/82     2 Minute Post BP 124/80       Interval HR   1 Minute HR 97     2 Minute HR 115     3 Minute HR 101     4 Minute HR 104     5 Minute HR 111     6 Minute HR 109     2 Minute Post HR 100     Interval Heart Rate? Yes       Interval Oxygen   Interval Oxygen? Yes     Baseline Oxygen Saturation % 93 %     1 Minute Oxygen Saturation % 93 %     1 Minute Liters of Oxygen 0 L     2 Minute Oxygen Saturation % 92 %     2 Minute Liters of Oxygen 0 L     3 Minute Oxygen Saturation % 94 %     3 Minute Liters of Oxygen 0 L     4 Minute Oxygen Saturation % 96 %     4 Minute Liters of Oxygen 0 L     5 Minute Oxygen Saturation % 93 %     5 Minute Liters of Oxygen 0 L     6 Minute Oxygen Saturation % 95 %     6 Minute Liters of Oxygen 0 L     2 Minute Post Oxygen Saturation % 96 %     2 Minute Post Liters of Oxygen 0 L       Oxygen Initial Assessment:  Oxygen Initial Assessment - 12/10/23 1414       Home Oxygen   Home Oxygen Device None    Sleep Oxygen Prescription None    Home Exercise Oxygen Prescription None    Home Resting Oxygen Prescription None      Intervention   Short Term Goals To learn and understand importance of maintaining oxygen saturations>88%;To learn and understand importance of monitoring SPO2 with pulse oximeter and demonstrate accurate use of the pulse oximeter.;To learn and demonstrate proper use of respiratory medications;To learn and demonstrate proper pursed lip breathing techniques or other breathing techniques.     Long  Term  Goals Exhibits proper breathing techniques, such as pursed lip breathing or other method taught during program session;Maintenance of O2 saturations>88%;Verbalizes importance of monitoring SPO2 with pulse oximeter and return demonstration;Demonstrates proper use of MDIs;Compliance with respiratory medication          Oxygen Re-Evaluation:  Oxygen Re-Evaluation     Row Name 12/17/23 0921 01/26/24 1259 02/23/24 1118         Program Oxygen Prescription   Program Oxygen Prescription -- None None       Home Oxygen   Home Oxygen Device -- None None     Sleep Oxygen Prescription -- None None     Home Exercise Oxygen Prescription -- None None     Home Resting Oxygen Prescription -- None None     Compliance with Home Oxygen Use -- Yes Yes       Goals/Expected Outcomes   Short Term Goals -- To learn and understand importance of maintaining oxygen saturations>88%;To learn and understand importance of monitoring SPO2 with pulse oximeter and demonstrate accurate use of the pulse oximeter. To learn and demonstrate proper pursed lip breathing techniques or other breathing techniques.  Long  Term Goals -- Exhibits proper breathing techniques, such as pursed lip breathing or other method taught during program session;Maintenance of O2 saturations>88%;Verbalizes importance of monitoring SPO2 with pulse oximeter and return demonstration Exhibits proper breathing techniques, such as pursed lip breathing or other method taught during program session     Comments Reviewed PLB technique with pt.  Talked about how it works and it's importance in maintaining their exercise saturations. Tonya Odonnell Odonnell been monitoring her pulse ox when she does not feel the best. She states that if she monitored it frequently, it would cause more stress. She is aware of her shortness of breath symptoms and manages it with pursed lip breathing when possible. She notes that she feels like her oxygen saturation in class Odonnell been better  than when she first started. Encouraged patient to monitor pulse ox when feeling well to establish a baseline Tonya Odonnell a pulse ox to monitor when she is not feeling great. Discussed PLB and when best to use it.     Goals/Expected Outcomes Short: Become more profiecient at using PLB.   Long: Become independent at using PLB. Short: monitor pulse ox while in class and at home to establish a baseline. Long: independenlty manage shortness of breath Short: monitor pulse ox while in class and at home to establish a baseline. Long: independenlty manage shortness of breath        Oxygen Discharge (Final Oxygen Re-Evaluation):  Oxygen Re-Evaluation - 02/23/24 1118       Program Oxygen Prescription   Program Oxygen Prescription None      Home Oxygen   Home Oxygen Device None    Sleep Oxygen Prescription None    Home Exercise Oxygen Prescription None    Home Resting Oxygen Prescription None    Compliance with Home Oxygen Use Yes      Goals/Expected Outcomes   Short Term Goals To learn and demonstrate proper pursed lip breathing techniques or other breathing techniques.     Long  Term Goals Exhibits proper breathing techniques, such as pursed lip breathing or other method taught during program session    Comments Tonya Odonnell a pulse ox to monitor when she is not feeling great. Discussed PLB and when best to use it.    Goals/Expected Outcomes Short: monitor pulse ox while in class and at home to establish a baseline. Long: independenlty manage shortness of breath          Initial Exercise Prescription:  Initial Exercise Prescription - 12/15/23 1400       Date of Initial Exercise RX and Referring Provider   Date 12/15/23    Referring Provider Ezra Shuck      Oxygen   Maintain Oxygen Saturation 88% or higher      Recumbant Bike   Level 3    RPM 50    Watts 25    Minutes 15    METs 3.3      NuStep   Level 3    SPM 80    Minutes 15    METs 3.3      Arm Ergometer   Level 3     Watts 25    RPM 50    Minutes 15    METs 3.3      REL-XR   Level 3    Watts 25    Speed 50    Minutes 15    METs 3.3      Prescription Details   Duration Progress to 30  minutes of continuous aerobic without signs/symptoms of physical distress      Intensity   THRR 40-80% of Max Heartrate 123-149    Ratings of Perceived Exertion 11-13    Perceived Dyspnea 0-4      Progression   Progression Continue to progress workloads to maintain intensity without signs/symptoms of physical distress.      Resistance Training   Training Prescription Yes    Weight 3lb    Reps 10-15          Perform Capillary Blood Glucose checks as needed.  Exercise Prescription Changes:   Exercise Prescription Changes     Row Name 12/15/23 1500 12/22/23 1400 01/21/24 1100 02/01/24 1100 02/17/24 0700     Response to Exercise   Blood Pressure (Admit) 134/80 102/64 108/70 124/68 106/74   Blood Pressure (Exercise) 132/82 108/68 134/66 144/74 150/80   Blood Pressure (Exit) 124/80 100/58 100/60 116/68 106/72   Heart Rate (Admit) 97 bpm 104 bpm 113 bpm 111 bpm 90 bpm   Heart Rate (Exercise) 115 bpm 141 bpm 128 bpm 130 bpm 113 bpm   Heart Rate (Exit) 100 bpm 106 bpm 110 bpm 120 bpm 92 bpm   Oxygen Saturation (Admit) 93 % 90 % 93 % 94 % 90 %   Oxygen Saturation (Exercise) 92 % 95 % 90 % 93 % 91 %   Oxygen Saturation (Exit) 96 % 96 % 93 % 90 % 93 %   Rating of Perceived Exertion (Exercise) 7 14 15 13 13    Perceived Dyspnea (Exercise) 1 1 1  0 0   Symptoms none none none none none   Comments results 1st day of exercise -- -- --   Duration -- Progress to 30 minutes of  aerobic without signs/symptoms of physical distress Progress to 30 minutes of  aerobic without signs/symptoms of physical distress Progress to 30 minutes of  aerobic without signs/symptoms of physical distress Progress to 30 minutes of  aerobic without signs/symptoms of physical distress   Intensity -- THRR unchanged THRR unchanged THRR  unchanged THRR unchanged     Progression   Progression -- Continue to progress workloads to maintain intensity without signs/symptoms of physical distress. Continue to progress workloads to maintain intensity without signs/symptoms of physical distress. Continue to progress workloads to maintain intensity without signs/symptoms of physical distress. Continue to progress workloads to maintain intensity without signs/symptoms of physical distress.   Average METs -- 3.1 2.66 2.85 2.3     Resistance Training   Training Prescription -- Yes Yes Yes Yes   Weight -- 3lb 3lb 3lb 3lb   Reps -- 10-15 10-15 10-15 10-15     Interval Training   Interval Training -- No No No No     Recumbant Bike   Level -- 3 2 1  --   Watts -- 32 22 19 --   Minutes -- 15 15 15  --   METs -- -- 2.2 2.85 --     NuStep   Level -- 2 3 -- 5   Minutes -- 15 15 -- 15   METs -- 3.1 3.1 -- 3.2     Arm Ergometer   Level -- -- 2 -- --   Watts -- -- 25 -- --   Minutes -- -- 15 -- --   METs -- -- 2.62 -- --     REL-XR   Level -- -- 3 -- 2   Minutes -- -- 15 -- 15   METs -- -- 4.2 --  3     Biostep-RELP   Level -- -- -- 2 2   Minutes -- -- -- 15 15     Oxygen   Maintain Oxygen Saturation 88% or higher 88% or higher 88% or higher 88% or higher 88% or higher      Exercise Comments:   Exercise Comments     Row Name 12/17/23 0919           Exercise Comments First full day of exercise!  Patient was oriented to gym and equipment including functions, settings, policies, and procedures.  Patient's individual exercise prescription and treatment plan were reviewed.  All starting workloads were established based on the results of the 6 minute walk test done at initial orientation visit.  The plan for exercise progression was also introduced and progression will be customized based on patient's performance and goals.          Exercise Goals and Review:   Exercise Goals     Row Name 12/15/23 1505              Exercise Goals   Increase Physical Activity Yes       Intervention Provide advice, education, support and counseling about physical activity/exercise needs.;Develop an individualized exercise prescription for aerobic and resistive training based on initial evaluation findings, risk stratification, comorbidities and participant's personal goals.       Expected Outcomes Short Term: Attend rehab on a regular basis to increase amount of physical activity.;Long Term: Add in home exercise to make exercise part of routine and to increase amount of physical activity.;Long Term: Exercising regularly at least 3-5 days a week.       Increase Strength and Stamina Yes       Intervention Provide advice, education, support and counseling about physical activity/exercise needs.;Develop an individualized exercise prescription for aerobic and resistive training based on initial evaluation findings, risk stratification, comorbidities and participant's personal goals.       Expected Outcomes Short Term: Increase workloads from initial exercise prescription for resistance, speed, and METs.;Short Term: Perform resistance training exercises routinely during rehab and add in resistance training at home;Long Term: Improve cardiorespiratory fitness, muscular endurance and strength as measured by increased METs and functional capacity ( )       Able to understand and use rate of perceived exertion (RPE) scale Yes       Intervention Provide education and explanation on how to use RPE scale       Expected Outcomes Short Term: Able to use RPE daily in rehab to express subjective intensity level;Long Term:  Able to use RPE to guide intensity level when exercising independently       Able to understand and use Dyspnea scale Yes       Intervention Provide education and explanation on how to use Dyspnea scale       Expected Outcomes Short Term: Able to use Dyspnea scale daily in rehab to express subjective sense of shortness of breath  during exertion;Long Term: Able to use Dyspnea scale to guide intensity level when exercising independently       Knowledge and understanding of Target Heart Rate Range (THRR) Yes       Intervention Provide education and explanation of THRR including how the numbers were predicted and where they are located for reference       Expected Outcomes Short Term: Able to state/look up THRR;Short Term: Able to use daily as guideline for intensity in rehab;Long Term: Able to use THRR to  govern intensity when exercising independently       Able to check pulse independently Yes       Intervention Provide education and demonstration on how to check pulse in carotid and radial arteries.;Review the importance of being able to check your own pulse for safety during independent exercise       Expected Outcomes Short Term: Able to explain why pulse checking is important during independent exercise;Long Term: Able to check pulse independently and accurately       Understanding of Exercise Prescription Yes       Intervention Provide education, explanation, and written materials on patient's individual exercise prescription       Expected Outcomes Short Term: Able to explain program exercise prescription;Long Term: Able to explain home exercise prescription to exercise independently          Exercise Goals Re-Evaluation :  Exercise Goals Re-Evaluation     Row Name 12/17/23 9078 12/22/23 1453 01/07/24 1637 01/21/24 1102 02/01/24 1123     Exercise Goal Re-Evaluation   Exercise Goals Review Increase Physical Activity;Able to understand and use rate of perceived exertion (RPE) scale;Knowledge and understanding of Target Heart Rate Range (THRR);Understanding of Exercise Prescription;Increase Strength and Stamina;Able to understand and use Dyspnea scale;Able to check pulse independently Increase Physical Activity;Understanding of Exercise Prescription;Increase Strength and Stamina Increase Physical Activity;Understanding  of Exercise Prescription;Increase Strength and Stamina Increase Physical Activity;Understanding of Exercise Prescription;Increase Strength and Stamina Increase Physical Activity;Understanding of Exercise Prescription;Increase Strength and Stamina   Comments Reviewed RPE and dyspnea scale, THR and program prescription with pt today.  Pt voiced understanding and was given a copy of goals to take home. Mattingly is off to a good start in the program and completed her first day in this review. She worked at level 3 on the recumbent bike with 32 watts and level 2 on the T4 nustep. We will continue to monitor her progress in the program. Tonya Odonnell Odonnell not attended since 12/17/23. We will be in touch with her to determine when she will return. Tonya Odonnell is doing well in rehab. She was recently able to increase from level 2 to 3 on the T4 nustep, as well as maintain level 3 on the XR. We will continue to monitor her progress in the program. Tonya Odonnell was only able to attend 1 session during this review period. During her one session she was able to use the Biostep at level 2, and the recumbent bike at level 1. We will continue to monitor her progress in the program.   Expected Outcomes Short: Use RPE daily to regulate intensity.  Long: Follow program prescription in THR. Short: Continue to follow current exercise prescription. Long: Continue exercise to improve strength and stamina. Short: Return to rehab. Long: Continue exercise to improve strength and stamina. Short: Continue to follow exercise prescription. Long: Continue exercise to improve strength and stamina. Short: Return to level 3 on the recumbent bike. Long: Continue exercise to improve strength and stamina.    Row Name 02/17/24 0739 02/23/24 1115           Exercise Goal Re-Evaluation   Exercise Goals Review Increase Physical Activity;Understanding of Exercise Prescription;Increase Strength and Stamina Increase Physical Activity;Increase Strength and Stamina;Understanding  of Exercise Prescription      Comments Tonya Odonnell is doing well in rehab. She was recently able to increase from level 3 to 5 on the T4 nustep. She was also able to maintain level 2 on both the XR and biostep. We will continue  to monitor her progress in the program. Tonya Odonnell is doing well in rehab. She is not doing much at home, she is walking her two dogs. Encouraged her to continue to walk her dogs and attend rehab.      Expected Outcomes Short: Continue to increase level on T4 nustep. Long: Contninue exercise to improve strength and stamina. STg: Attend rehab regularly. LTG: Continue to exercise to improve strength and stamina         Discharge Exercise Prescription (Final Exercise Prescription Changes):  Exercise Prescription Changes - 02/17/24 0700       Response to Exercise   Blood Pressure (Admit) 106/74    Blood Pressure (Exercise) 150/80    Blood Pressure (Exit) 106/72    Heart Rate (Admit) 90 bpm    Heart Rate (Exercise) 113 bpm    Heart Rate (Exit) 92 bpm    Oxygen Saturation (Admit) 90 %    Oxygen Saturation (Exercise) 91 %    Oxygen Saturation (Exit) 93 %    Rating of Perceived Exertion (Exercise) 13    Perceived Dyspnea (Exercise) 0    Symptoms none    Duration Progress to 30 minutes of  aerobic without signs/symptoms of physical distress    Intensity THRR unchanged      Progression   Progression Continue to progress workloads to maintain intensity without signs/symptoms of physical distress.    Average METs 2.3      Resistance Training   Training Prescription Yes    Weight 3lb    Reps 10-15      Interval Training   Interval Training No      NuStep   Level 5    Minutes 15    METs 3.2      REL-XR   Level 2    Minutes 15    METs 3      Biostep-RELP   Level 2    Minutes 15      Oxygen   Maintain Oxygen Saturation 88% or higher          Nutrition:  Target Goals: Understanding of nutrition guidelines, daily intake of sodium 1500mg , cholesterol 200mg ,  calories 30% from fat and 7% or less from saturated fats, daily to have 5 or more servings of fruits and vegetables.  Education: Nutrition 1 -Group instruction provided by verbal, written material, interactive activities, discussions, models, and posters to present general guidelines for heart healthy nutrition including macronutrients, label reading, and promoting whole foods over processed counterparts. Education serves as pensions consultant of discussion of heart healthy eating for all. Written material provided at class time.     Education: Nutrition 2 -Group instruction provided by verbal, written material, interactive activities, discussions, models, and posters to present general guidelines for heart healthy nutrition including sodium, cholesterol, and saturated fat. Providing guidance of habit forming to improve blood pressure, cholesterol, and body weight. Written material provided at class time.     Biometrics:  Pre Biometrics - 12/15/23 1505       Pre Biometrics   Height 5' 3.5 (1.613 m)    Weight 176 lb 3.2 oz (79.9 kg)    Waist Circumference 38 inches    Hip Circumference 42 inches    Waist to Hip Ratio 0.9 %    BMI (Calculated) 30.72    Single Leg Stand 29.75 seconds           Nutrition Therapy Plan and Nutrition Goals:  Nutrition Therapy & Goals - 01/26/24 1302  Nutrition Therapy   RD appointment deferred Yes          Nutrition Assessments:  MEDIFICTS Score Key: >=70 Need to make dietary changes  40-70 Heart Healthy Diet <= 40 Therapeutic Level Cholesterol Diet   Picture Your Plate Scores: <59 Unhealthy dietary pattern with much room for improvement. 41-50 Dietary pattern unlikely to meet recommendations for good health and room for improvement. 51-60 More healthful dietary pattern, with some room for improvement.  >60 Healthy dietary pattern, although there may be some specific behaviors that could be improved.   Nutrition Goals Re-Evaluation:   Nutrition Goals Re-Evaluation     Row Name 02/23/24 1122             Goals   Comment Spoke with Tonya Odonnell about eating smaller nutreint dense meals and making sure she meets her protein goals. She says she will work on it, but says it can be challenging some days becasue she is not hungery or her stomach is upset.       Expected Outcome STG: Small frequent meals with protein rich foods. LTG: Eat a healthy diet meeting increased needs for healing.          Nutrition Goals Discharge (Final Nutrition Goals Re-Evaluation):  Nutrition Goals Re-Evaluation - 02/23/24 1122       Goals   Comment Spoke with Tonya Odonnell about eating smaller nutreint dense meals and making sure she meets her protein goals. She says she will work on it, but says it can be challenging some days becasue she is not hungery or her stomach is upset.    Expected Outcome STG: Small frequent meals with protein rich foods. LTG: Eat a healthy diet meeting increased needs for healing.          Psychosocial: Target Goals: Acknowledge presence or absence of significant depression and/or stress, maximize coping skills, provide positive support system. Participant is able to verbalize types and ability to use techniques and skills needed for reducing stress and depression.   Education: Stress, Anxiety, and Depression - Group verbal and visual presentation to define topics covered.  Reviews how body is impacted by stress, anxiety, and depression.  Also discusses healthy ways to reduce stress and to treat/manage anxiety and depression.  Written material provided at class time.   Education: Sleep Hygiene -Provides group verbal and written instruction about how sleep can affect your health.  Define sleep hygiene, discuss sleep cycles and impact of sleep habits. Review good sleep hygiene tips.    Initial Review & Psychosocial Screening:  Initial Psych Review & Screening - 12/10/23 1408       Initial Review   Current issues with None  Identified      Family Dynamics   Good Support System? Yes   son, ex-husband, social worker   Comments Tonya Odonnell states that she previously attended pulmonary rehab, however she had to discharge early due to difficulty with veritgo. She Odonnell that these symptoms are more well controlled at this time, however she should avoid the treadmill and walking in circles. Tonya Odonnell states that she is seeing a spine specialist due to chronic back issues and that they are talking about physical therapy to improve pain and mobility, however this Odonnell not been set up at this time. She denies any mental health concerns and states that she Odonnell a good support system, consisting of her son and ex-husband. Tonya Odonnell does want to take advantage of the dietician services to help balance her diet.  Barriers   Psychosocial barriers to participate in program There are no identifiable barriers or psychosocial needs.;The patient should benefit from training in stress management and relaxation.      Screening Interventions   Interventions Encouraged to exercise;To provide support and resources with identified psychosocial needs;Provide feedback about the scores to participant    Expected Outcomes Short Term goal: Utilizing psychosocial counselor, staff and physician to assist with identification of specific Stressors or current issues interfering with healing process. Setting desired goal for each stressor or current issue identified.;Long Term Goal: Stressors or current issues are controlled or eliminated.;Short Term goal: Identification and review with participant of any Quality of Life or Depression concerns found by scoring the questionnaire.;Long Term goal: The participant improves quality of Life and PHQ9 Scores as seen by post scores and/or verbalization of changes          Quality of Life Scores:  Scores of 19 and below usually indicate a poorer quality of life in these areas.  A difference of  2-3 points is a clinically  meaningful difference.  A difference of 2-3 points in the total score of the Quality of Life Index Odonnell been associated with significant improvement in overall quality of life, self-image, physical symptoms, and general health in studies assessing change in quality of life.  PHQ-9: Review Flowsheet  More data exists      01/26/2024 12/15/2023 07/22/2023 12/10/2022 06/17/2021  Depression screen PHQ 2/9  Decreased Interest 0 0 0 1 1 0  Down, Depressed, Hopeless 0 0 0 1 1 0  PHQ - 2 Score 0 0 0 2 2 0  Altered sleeping 0 0 0 3 3 2   Tired, decreased energy 3 3 3 3 3 3   Change in appetite 1 2 2 1 2  0  Feeling bad or failure about yourself  0 0 0 -- 0 1  Trouble concentrating 0 1 0 0 0 1  Moving slowly or fidgety/restless 0 0 0 0 1 0  Suicidal thoughts 0 0 0 -- 0 0  PHQ-9 Score 4 6  5  9  11  7    Difficult doing work/chores Somewhat difficult - - Very difficult Very difficult -    Details       Data saved with a previous flowsheet row definition   Multiple values from one day are sorted in reverse-chronological order        Interpretation of Total Score  Total Score Depression Severity:  1-4 = Minimal depression, 5-9 = Mild depression, 10-14 = Moderate depression, 15-19 = Moderately severe depression, 20-27 = Severe depression   Psychosocial Evaluation and Intervention:  Psychosocial Evaluation - 12/10/23 1412       Psychosocial Evaluation & Interventions   Interventions Encouraged to exercise with the program and follow exercise prescription;Relaxation education;Stress management education    Comments Tonya Odonnell states that she previously attended pulmonary rehab, however she had to discharge early due to difficulty with veritgo. She Odonnell that these symptoms are more well controlled at this time, however she should avoid the treadmill and walking in circles. Jakira states that she is seeing a spine specialist due to chronic back issues and that they are talking about physical therapy to  improve pain and mobility, however this Odonnell not been set up at this time. She denies any mental health concerns and states that she Odonnell a good support system, consisting of her son and ex-husband. Jaelyne does want to take advantage of the dietician services to help balance  her diet.    Expected Outcomes Short: Attend pulmonary rehab for education and exercise. Long: Develop and maintain positive self care habits.    Continue Psychosocial Services  Follow up required by staff          Psychosocial Re-Evaluation:  Psychosocial Re-Evaluation     Row Name 01/26/24 1303 02/23/24 1120           Psychosocial Re-Evaluation   Current issues with Current Stress Concerns Current Stress Concerns      Comments Tonya Odonnell is struggling with her health concerns and states she feels like she can't get her doctors to respond to her. Her new symptoms she is discussing with her doctors come and go, with no answers as to what might be causing it. She was encouraged to go to the doctor's office since she hasn't been able to contact them via phone or MyChart. The rheumatologist wants to run more tests, but her appointment isnt for another couple months. She mentions she Odonnell been dealing with poor health for a while and she feels like there is no hope when it comes to her feeling better. Encouraged exercising and talk therapy. Tonya Odonnell is still struggling with stress around her Dr not responding often and her difficulty finding causes for her symptoms.      Expected Outcomes Short: go to her doctor's office in person to ask questions Long: independently manage positive stress reduction techniques Short: go to her doctor's office in person to ask questions Long: independently manage positive stress reduction techniques      Interventions Encouraged to attend Pulmonary Rehabilitation for the exercise Encouraged to attend Pulmonary Rehabilitation for the exercise      Continue Psychosocial Services  Follow up required by staff  Follow up required by staff         Psychosocial Discharge (Final Psychosocial Re-Evaluation):  Psychosocial Re-Evaluation - 02/23/24 1120       Psychosocial Re-Evaluation   Current issues with Current Stress Concerns    Comments Tonya Odonnell is still struggling with stress around her Dr not responding often and her difficulty finding causes for her symptoms.    Expected Outcomes Short: go to her doctor's office in person to ask questions Long: independently manage positive stress reduction techniques    Interventions Encouraged to attend Pulmonary Rehabilitation for the exercise    Continue Psychosocial Services  Follow up required by staff          Education: Education Goals: Education classes will be provided on a weekly basis, covering required topics. Participant will state understanding/return demonstration of topics presented.  Learning Barriers/Preferences:  Learning Barriers/Preferences - 12/10/23 1407       Learning Barriers/Preferences   Learning Barriers None    Learning Preferences None          General Pulmonary Education Topics:  Infection Prevention: - Provides verbal and written material to individual with discussion of infection control including proper hand washing and proper equipment cleaning during exercise session. Flowsheet Row Pulmonary Rehab from 12/15/2023 in [facility]  Date 12/15/23  Educator [facility]  Instruction Review Code 1- Verbalizes Understanding    Falls Prevention: - Provides verbal and written material to individual with discussion of falls prevention and safety. Flowsheet Row Pulmonary Rehab from 12/15/2023 in [facility] Pulmonary Re[facility]  Date 12/15/23  Educator [facility]  Instruction Review Code 1- Verbalizes Understanding    Chronic Lung Disease Review: - Group verbal instruction with posters, models, PowerPoint presentations and videos,  to  review new updates, new respiratory medications, new advancements in  procedures and treatments. Providing information on websites and 800 numbers for continued self-education. Includes information about supplement oxygen, available portable oxygen systems, continuous and intermittent flow rates, oxygen safety, concentrators, and Medicare reimbursement for oxygen. Explanation of Pulmonary Drugs, including class, frequency, complications, importance of spacers, rinsing mouth after steroid MDI's, and proper cleaning methods for nebulizers. Review of basic lung anatomy and physiology related to function, structure, and complications of lung disease. Review of risk factors. Discussion about methods for diagnosing sleep apnea and types of masks and machines for OSA. Includes a review of the use of types of environmental controls: home humidity, furnaces, filters, dust mite/pet prevention, HEPA vacuums. Discussion about weather changes, air quality and the benefits of nasal washing. Instruction on Warning signs, infection symptoms, calling MD promptly, preventive modes, and value of vaccinations. Review of effective airway clearance, coughing and/or vibration techniques. Emphasizing that all should Create an Action Plan. Written material provided at class time.   AED/CPR: - Group verbal and written instruction with the use of models to demonstrate the basic use of the AED with the basic ABC's of resuscitation.    Tests and Procedures:  - Group verbal and visual presentation and models provide information about basic cardiac anatomy and function. Reviews the testing methods done to diagnose heart disease and the outcomes of the test results. Describes the treatment choices: Medical Management, Angioplasty, or Coronary Bypass Surgery for treating various heart conditions including Myocardial Infarction, Angina, Valve Disease, and Cardiac Arrhythmias.  Written material provided at class time.   Medication Safety: - Group verbal and visual instruction to review commonly  prescribed medications for heart and lung disease. Reviews the medication, class of the drug, and side effects. Includes the steps to properly store meds and maintain the prescription regimen.  Written material given at graduation.   Other: -Provides group and verbal instruction on various topics (see comments)   Knowledge Questionnaire Score:    Core Components/Risk Factors/Patient Goals at Admission:  Personal Goals and Risk Factors at Admission - 12/10/23 1406       Core Components/Risk Factors/Patient Goals on Admission    Weight Management Yes;Weight Loss    Intervention Weight Management: Develop a combined nutrition and exercise program designed to reach desired caloric intake, while maintaining appropriate intake of nutrient and fiber, sodium and fats, and appropriate energy expenditure required for the weight goal.;Weight Management: Provide education and appropriate resources to help participant work on and attain dietary goals.;Weight Management/Obesity: Establish reasonable short term and long term weight goals.;Obesity: Provide education and appropriate resources to help participant work on and attain dietary goals.    Goal Weight: Short Term 155 lb (70.3 kg)    Expected Outcomes Short Term: Continue to assess and modify interventions until short term weight is achieved;Weight Loss: Understanding of general recommendations for a balanced deficit meal plan, which promotes 1-2 lb weight loss per week and includes a negative energy balance of [phone] kcal/d;Understanding recommendations for meals to include 15-35% energy as protein, 25-35% energy from fat, 35-60% energy from carbohydrates, less than 200mg  of dietary cholesterol, 20-35 gm of total fiber daily;Understanding of distribution of calorie intake throughout the day with the consumption of 4-5 meals/snacks;Long Term: Adherence to nutrition and physical activity/exercise program aimed toward attainment of established weight goal     Improve shortness of breath with ADL's Yes    Intervention Provide education, individualized exercise plan and daily activity instruction to help decrease symptoms of  SOB with activities of daily living.    Expected Outcomes Short Term: Improve cardiorespiratory fitness to achieve a reduction of symptoms when performing ADLs;Long Term: Be able to perform more ADLs without symptoms or delay the onset of symptoms    Hypertension Yes    Intervention Provide education on lifestyle modifcations including regular physical activity/exercise, weight management, moderate sodium restriction and increased consumption of fresh fruit, vegetables, and low fat dairy, alcohol moderation, and smoking cessation.;Monitor prescription use compliance.    Expected Outcomes Short Term: Continued assessment and intervention until BP is < 140/75mm HG in hypertensive participants. < 130/70mm HG in hypertensive participants with diabetes, heart failure or chronic kidney disease.;Long Term: Maintenance of blood pressure at goal levels.    Lipids Yes    Intervention Provide education and support for participant on nutrition & aerobic/resistive exercise along with prescribed medications to achieve LDL 70mg , HDL >40mg .    Expected Outcomes Long Term: Cholesterol controlled with medications as prescribed, with individualized exercise RX and with personalized nutrition plan. Value goals: LDL < 70mg , HDL > 40 mg.;Short Term: Participant states understanding of desired cholesterol values and is compliant with medications prescribed. Participant is following exercise prescription and nutrition guidelines.          Education:Diabetes - Individual verbal and written instruction to review signs/symptoms of diabetes, desired ranges of glucose level fasting, after meals and with exercise. Acknowledge that pre and post exercise glucose checks will be done for 3 sessions at entry of program.   Know Your Numbers and Heart Failure: -  Group verbal and visual instruction to discuss disease risk factors for cardiac and pulmonary disease and treatment options.  Reviews associated critical values for Overweight/Obesity, Hypertension, Cholesterol, and Diabetes.  Discusses basics of heart failure: signs/symptoms and treatments.  Introduces Heart Failure Zone chart for action plan for heart failure. Written material provided at class time.   Core Components/Risk Factors/Patient Goals Review:   Goals and Risk Factor Review     Row Name 01/26/24 1307 02/23/24 1125           Core Components/Risk Factors/Patient Goals Review   Personal Goals Review Weight Management/Obesity;Hypertension;Lipids;Improve shortness of breath with ADL's Weight Management/Obesity;Hypertension;Improve shortness of breath with ADL's      Review Tonya Odonnell states she Odonnell been sick the last few sessions so she is returning not feeling the strongest. She is unsure of the cause of her symptoms, but was pleased her oxygen sat and heart rate were good. Her blood pressure was stable in the 120s/60s. Her concern is her weight and she is thinking it could be fluid. She takes a daily fluid pill and was encouraged to weight herself daily to help manage fluid balance. She continues to take all her medication as prescribed, she is not currently on medicaiton for lipid management but is using her diet and exercise as managment. Tonya Odonnell Odonnell she doesnt check her BP much, but says it is often low. Needs to speak with Drs about this. Encouraged her to tell tehm soon about meds and low BP. She is still struggling with shortness of breath and finds exercise difficult but she wants to lose weight and become stronger. Encouraged her to eat more protein and attend rehab consistently with some home exercise.      Expected Outcomes Short: weigh daily and track symptoms Long: independenlty manage risk factors STG: eat more protein and attend rehab consistently with some home exercise. LTG: Manage  risk factors independently  Core Components/Risk Factors/Patient Goals at Discharge (Final Review):   Goals and Risk Factor Review - 02/23/24 1125       Core Components/Risk Factors/Patient Goals Review   Personal Goals Review Weight Management/Obesity;Hypertension;Improve shortness of breath with ADL's    Review Tonya Odonnell she doesnt check her BP much, but says it is often low. Needs to speak with Drs about this. Encouraged her to tell tehm soon about meds and low BP. She is still struggling with shortness of breath and finds exercise difficult but she wants to lose weight and become stronger. Encouraged her to eat more protein and attend rehab consistently with some home exercise.    Expected Outcomes STG: eat more protein and attend rehab consistently with some home exercise. LTG: Manage risk factors independently          ITP Comments:  ITP Comments     Row Name 12/10/23 1350 12/15/23 1456 12/17/23 0919 12/30/23 1038 01/27/24 1151   ITP Comments Initial phone call completed. Diagnosis can be found in [facility] 11/24/2023. EP Orientation scheduled for Tuesday, 12/15/2023. Completed and gym orientation for pulmonary rehab. Initial ITP created and sent for review to Dr. Fuad Aleskerov, Medical Director. First full day of exercise!  Patient was oriented to gym and equipment including functions, settings, policies, and procedures.  Patient's individual exercise prescription and treatment plan were reviewed.  All starting workloads were established based on the results of the 6 minute walk test done at initial orientation visit.  The plan for exercise progression was also introduced and progression will be customized based on patient's performance and goals. 30 Day review completed. Medical Director ITP review done, changes made as directed, and signed approval by Medical Director. 30 Day review completed. Medical Director ITP review done, changes made as directed, and signed approval by  Medical Director.    Row Name 02/24/24 0849           ITP Comments 30 Day review completed. Medical Director ITP review done, changes made as directed, and signed approval by Medical Director.          Comments: 30 day review      [1]  Current Outpatient Medications:    ambrisentan  (LETAIRIS ) 10 MG tablet, Take 1 tablet (10 mg total) by mouth daily., Disp: 30 tablet, Rfl: 11   Azelaic Acid  15 % gel, Apply to face every morning, Disp: 50 g, Rfl: 5   bismuth subsalicylate (PEPTO BISMOL) 262 MG/15ML suspension, Take 30 mLs by mouth every 6 (six) hours as needed for diarrhea or loose stools., Disp: , Rfl:    clobetasol  cream (TEMOVATE ) 0.05 %, Apply 1 Application topically as needed., Disp: 30 g, Rfl: 4   cromolyn  (OPTICROM ) 4 % ophthalmic solution, Place 1 drop into both eyes 4 (four) times daily., Disp: 10 mL, Rfl: 4   ELIDEL  1 % cream, Apply to red scaly areas on the face BID for seb derm, Disp: 30 g, Rfl: 4   fluticasone  (FLONASE ) 50 MCG/ACT nasal spray, Place 2 sprays into both nostrils daily as needed for allergies or rhinitis., Disp: , Rfl:    furosemide  (LASIX ) 40 MG tablet, Take 1 tablet (40 mg total) by mouth daily., Disp: 90 tablet, Rfl: 1   hydrALAZINE  (APRESOLINE ) 25 MG tablet, TAKE 1 TABLET BY MOUTH TWICE A DAY, Disp: 180 tablet, Rfl: 3   Ivermectin  (SOOLANTRA ) 1 % CREA, Apply 1 Application topically at bedtime., Disp: 45 g, Rfl: 5   levothyroxine  (SYNTHROID ) 112 MCG tablet, Take  1 pill everyday except Sunday take half a pill, Disp: 90 tablet, Rfl: 3   meclizine  (ANTIVERT ) 25 MG tablet, Take 1 tablet (25 mg total) by mouth 3 (three) times daily as needed for dizziness., Disp: 30 tablet, Rfl: 0   meloxicam  (MOBIC ) 15 MG tablet, Take 1 tablet (15 mg total) by mouth daily., Disp: 21 tablet, Rfl: 0   omeprazole  (PRILOSEC) 40 MG capsule, TAKE 1 CAPSULE (40 MG TOTAL) BY MOUTH DAILY., Disp: 90 capsule, Rfl: 4   ondansetron  (ZOFRAN ) 4 MG tablet, Take 1 tablet (4 mg total) by mouth  every 8 (eight) hours as needed for nausea or vomiting., Disp: 15 tablet, Rfl: 0   potassium chloride  SA (KLOR-CON  M) 20 MEQ tablet, Take 1 tablet (20 mEq total) by mouth daily., Disp: 90 tablet, Rfl: 1   Selexipag  (UPTRAVI ) 1000 MCG TABS, Take 1,000 mcg by mouth 2 (two) times daily., Disp: 60 tablet, Rfl: 11   sotatercept (WINREVAIR ) subcutaneous injection, Inject 0.5 mL (0.3 mg/kg) subcutaneously every 21 days, Disp: 1 kit, Rfl: 6   tadalafil , PAH, (ADCIRCA ) 20 MG tablet, TAKE 2 TABLETS (40 MG TOTAL) BY MOUTH DAILY., Disp: 180 tablet, Rfl: 3   valACYclovir  (VALTREX ) 1000 MG tablet, Take 1,000 mg by mouth daily as needed (Cold Sores)., Disp: , Rfl:  [2]  Social History Tobacco Use  Smoking Status Never  Smokeless Tobacco Never

## 2024-02-25 ENCOUNTER — Encounter

## 2024-02-25 DIAGNOSIS — H814 Vertigo of central origin: Secondary | ICD-10-CM | POA: Diagnosis not present

## 2024-02-25 DIAGNOSIS — I2721 Secondary pulmonary arterial hypertension: Secondary | ICD-10-CM

## 2024-02-25 DIAGNOSIS — Z7689 Persons encountering health services in other specified circumstances: Secondary | ICD-10-CM | POA: Diagnosis not present

## 2024-02-25 NOTE — Progress Notes (Signed)
 Daily Session Note  Patient Details  Name: Tonya Odonnell MRN: [id number] Date of Birth: 21-Mar-1965 Referring Provider:   Flowsheet Row Pulmonary Rehab from 12/15/2023 in [facility] Cardiac and Pulmonary Rehab  Referring Provider Ezra Shuck    Encounter Date: 02/25/2024  Check In:  Session Check In - 02/25/24 0945       Check-In   Supervising physician immediately available to respond to emergencies See telemetry face sheet for immediately available ER MD    Location [facility]-Cardiac & Pulmonary Rehab    Staff Present Leita Franks RN,BSN;Joseph [facility] BS, Exercise Physiologist;Margaret Best, MS, Exercise Physiologist    Virtual Visit No    Medication changes reported     No    Fall or balance concerns reported    No    Tobacco Cessation No Change    Warm-up and Cool-down Performed on first and last piece of equipment    Resistance Training Performed Yes    VAD Patient? No    PAD/SET Patient? No      Pain Assessment   Currently in Pain? No/denies             Tobacco Use History[1]  Goals Met:  Proper associated with RPD/PD & O2 Sat Independence with exercise equipment Using PLB without cueing & demonstrates good technique Exercise tolerated well No report of concerns or symptoms today Strength training completed today  Goals Unmet:  Not Applicable  Comments: Pt able to follow exercise prescription today without complaint.  Will continue to monitor for progression.    Dr. Oneil Pinal is Medical Director for [facility] Cardiac Rehabilitation.  Dr. Fuad Aleskerov is Medical Director for [facility] Pulmonary Rehabilitation.    [1]  Social History Tobacco Use  Smoking Status Never  Smokeless Tobacco Never

## 2024-02-27 DIAGNOSIS — Z7689 Persons encountering health services in other specified circumstances: Secondary | ICD-10-CM | POA: Diagnosis not present

## 2024-03-01 ENCOUNTER — Encounter

## 2024-03-01 DIAGNOSIS — I2721 Secondary pulmonary arterial hypertension: Secondary | ICD-10-CM | POA: Diagnosis not present

## 2024-03-01 DIAGNOSIS — Z7689 Persons encountering health services in other specified circumstances: Secondary | ICD-10-CM | POA: Diagnosis not present

## 2024-03-01 NOTE — Progress Notes (Signed)
 Daily Session Note  Patient Details  Name: Tonya Odonnell MRN: [id number] Date of Birth: 11/03/1965 Referring Provider:   Flowsheet Row Pulmonary Rehab from 12/15/2023 in [facility] Cardiac and Pulmonary Rehab  Referring Provider Ezra Shuck    Encounter Date: 03/01/2024  Check In:  Session Check In - 03/01/24 1058       Check-In   Supervising physician immediately available to respond to emergencies See telemetry face sheet for immediately available ER MD    Location [facility]-Cardiac & Pulmonary Rehab    Staff Present Burnard Davenport RN,BSN,MPA;Laura Cates RN,BSN;Kristen Coble RN,BC,MSN;Jason Elnor [facility]    Virtual Visit No    Medication changes reported     No    Fall or balance concerns reported    No    Tobacco Cessation No Change    Warm-up and Cool-down Performed on first and last piece of equipment    Resistance Training Performed Yes    VAD Patient? No    PAD/SET Patient? No      Pain Assessment   Currently in Pain? No/denies             Tobacco Use History[1]  Goals Met:  Proper associated with RPD/PD & O2 Sat Independence with exercise equipment Using PLB without cueing & demonstrates good technique Exercise tolerated well No report of concerns or symptoms today Strength training completed today  Goals Unmet:  Not Applicable  Comments: Pt able to follow exercise prescription today without complaint.  Will continue to monitor for progression.    Dr. Oneil Pinal is Medical Director for [facility] Cardiac Rehabilitation.  Dr. Fuad Aleskerov is Medical Director for [facility] Pulmonary Rehabilitation.    [1]  Social History Tobacco Use  Smoking Status Never  Smokeless Tobacco Never

## 2024-03-04 ENCOUNTER — Ambulatory Visit (HOSPITAL_COMMUNITY)
Admission: RE | Admit: 2024-03-04 | Discharge: 2024-03-04 | Disposition: A | Discharge status: Home / Self Care | Source: Ambulatory Visit | Attending: Internal Medicine | Admitting: Internal Medicine

## 2024-03-04 ENCOUNTER — Telehealth (HOSPITAL_COMMUNITY): Payer: Self-pay | Admitting: *Deleted

## 2024-03-04 ENCOUNTER — Other Ambulatory Visit: Payer: Self-pay

## 2024-03-04 DIAGNOSIS — I2721 Secondary pulmonary arterial hypertension: Secondary | ICD-10-CM | POA: Insufficient documentation

## 2024-03-04 LAB — CBC
HCT: 49.1 % — ABNORMAL HIGH (ref 36.0–46.0)
Hemoglobin: 16.7 g/dL — ABNORMAL HIGH (ref 12.0–15.0)
MCH: 29 pg (ref 26.0–34.0)
MCHC: 34 g/dL (ref 30.0–36.0)
MCV: 85.2 fL (ref 80.0–100.0)
Platelets: 333 K/uL (ref 150–400)
RBC: 5.76 MIL/uL — ABNORMAL HIGH (ref 3.87–5.11)
RDW: 14.6 % (ref 11.5–15.5)
WBC: 6.2 K/uL (ref 4.0–10.5)
nRBC: 0 % (ref 0.0–0.2)

## 2024-03-04 MED ORDER — UPTRAVI 800 MCG PO TABS: 800.0000 ug | ORAL_TABLET | Freq: Two times a day (BID) | ORAL | 11 refills | Status: AC

## 2024-03-04 NOTE — Telephone Encounter (Signed)
 Pt in for lab work this AM and reports not feeling well, she states she feels achy and has pain off/on all over as well as hot flashes, she feels this is all r/t medications, she reports she did not increase her Uptravi  to 1000 mcg as instructed at last OV as she was scared to do so.   Labs and symptoms reviewed by Beckey Coe, NP, she recommends pt stop Winrevair  and continue Uptravi  at 800 mcg BID.  Pt aware, agreeable, and verbalized understanding, new RX sent to [facility] for Uptravi 

## 2024-03-05 ENCOUNTER — Ambulatory Visit (HOSPITAL_COMMUNITY): Payer: Self-pay | Admitting: Cardiology

## 2024-03-07 ENCOUNTER — Other Ambulatory Visit: Payer: Self-pay

## 2024-03-07 ENCOUNTER — Telehealth (HOSPITAL_COMMUNITY): Payer: Self-pay

## 2024-03-07 ENCOUNTER — Other Ambulatory Visit (HOSPITAL_COMMUNITY): Payer: Self-pay

## 2024-03-07 NOTE — Telephone Encounter (Signed)
 Advanced Heart Failure Patient Advocate Encounter  Prior authorization for Uptravi  has been submitted and approved. Test billing returns $4 for 90 day supply.  Key: [id number] Effective: 03/07/2024 to 03/07/2025  Rachel DEL, CPhT Rx Patient Advocate Phone: [phone]

## 2024-03-07 NOTE — Progress Notes (Signed)
 Specialty Pharmacy Refill Coordination Note  Tonya Odonnell is a 58 y.o. female contacted today regarding refills of specialty medication(s) Ambrisentan  (LETAIRIS )   Patient requested Delivery   Delivery date: 03/09/24   Verified address: [address]   [place] [address]   Medication will be filled on: 03/08/24

## 2024-03-08 ENCOUNTER — Encounter

## 2024-03-08 DIAGNOSIS — I2721 Secondary pulmonary arterial hypertension: Secondary | ICD-10-CM

## 2024-03-08 NOTE — Progress Notes (Signed)
 Daily Session Note  Patient Details  Name: Tonya Odonnell MRN: [id number] Date of Birth: 21-Feb-1966 Referring Provider:   Flowsheet Row Pulmonary Rehab from 12/15/2023 in [facility] Cardiac and Pulmonary Rehab  Referring Provider Ezra Shuck    Encounter Date: 03/08/2024  Check In:  Session Check In - 03/08/24 1126       Check-In   Supervising physician immediately available to respond to emergencies See telemetry face sheet for immediately available ER MD    Location [facility]-Cardiac & Pulmonary Rehab    Staff Present Burnard Davenport RN,BSN,MPA;Meredith Tressa RN,BSN;Maxon Conetta BS, Exercise Physiologist;Jason Elnor RDN,LDN    Virtual Visit No    Medication changes reported     No    Fall or balance concerns reported    No    Tobacco Cessation No Change    Warm-up and Cool-down Performed on first and last piece of equipment    Resistance Training Performed Yes    VAD Patient? No    PAD/SET Patient? No      Pain Assessment   Currently in Pain? No/denies             Tobacco Use History[1]  Goals Met:  Proper associated with RPD/PD & O2 Sat Independence with exercise equipment Using PLB without cueing & demonstrates good technique Exercise tolerated well No report of concerns or symptoms today Strength training completed today  Goals Unmet:  Not Applicable  Comments: Pt able to follow exercise prescription today without complaint.  Will continue to monitor for progression.    Dr. Oneil Pinal is Medical Director for [facility] Cardiac Rehabilitation.  Dr. Fuad Aleskerov is Medical Director for [facility] Pulmonary Rehabilitation.    [1]  Social History Tobacco Use  Smoking Status Never  Smokeless Tobacco Never

## 2024-03-14 ENCOUNTER — Encounter: Payer: Self-pay | Admitting: Nurse Practitioner

## 2024-03-14 ENCOUNTER — Ambulatory Visit: Admitting: Gastroenterology

## 2024-03-14 ENCOUNTER — Telehealth: Payer: Self-pay | Admitting: Internal Medicine

## 2024-03-14 ENCOUNTER — Encounter: Payer: Self-pay | Admitting: Gastroenterology

## 2024-03-14 ENCOUNTER — Telehealth: Payer: Self-pay | Admitting: Gastroenterology

## 2024-03-14 ENCOUNTER — Telehealth (HOSPITAL_COMMUNITY): Payer: Self-pay | Admitting: Cardiology

## 2024-03-14 VITALS — BP 158/98 | HR 92 | Ht 63.0 in | Wt 180.0 lb

## 2024-03-14 DIAGNOSIS — R0982 Postnasal drip: Secondary | ICD-10-CM | POA: Diagnosis not present

## 2024-03-14 DIAGNOSIS — K219 Gastro-esophageal reflux disease without esophagitis: Secondary | ICD-10-CM

## 2024-03-14 DIAGNOSIS — R051 Acute cough: Secondary | ICD-10-CM

## 2024-03-14 DIAGNOSIS — R0981 Nasal congestion: Secondary | ICD-10-CM

## 2024-03-14 DIAGNOSIS — R194 Change in bowel habit: Secondary | ICD-10-CM

## 2024-03-14 DIAGNOSIS — Z860100 Personal history of colon polyps, unspecified: Secondary | ICD-10-CM

## 2024-03-14 MED ORDER — FLUTICASONE PROPIONATE 50 MCG/ACT NA SUSP: 2.0000 | Freq: Every day | NASAL | 5 refills | Status: AC | PRN

## 2024-03-14 NOTE — Telephone Encounter (Signed)
 Patient called to report severe acid reflux since Friday  Reports coughing and nausea Nasal congestion Throat and belly swelling   Questioned if this related to uptravi   Completed virtual visit with GI provider-reports she was started on omperazole BID

## 2024-03-14 NOTE — Telephone Encounter (Signed)
 I agree it could potentially be the Uptravi  increase. If patient is agreeable, you can monitor symptoms after starting omeprazole  x 3 days. If it does not improve or worsens, then decrease Uptravi  to highest previously tolerate dosed

## 2024-03-14 NOTE — Telephone Encounter (Signed)
 Spoke with patient who reports worsening reflux.  Experiencing sore throat and trouble swallowing.  Patient denies fever, body aches,. Advised patient that  it is sounds like strep or pharyngitis.  Patient insistent this is different and she tastes something bitter in her throat. Able to schedule an 11 am today with Deanna who agreed to see patient as long as she doesn't have COVID/FLU symptoms.

## 2024-03-14 NOTE — Progress Notes (Addendum)
 "  Chief Complaint: cough , congestion Primary GI Doctor:Dr. Federico   HPI:  Patient is a  59  year old female patient with past medical history of fibromyalgia, pulmonary hypertension, hypothyroidism, breast cancer s/p left mastectomy treated with chemo complicated by neuropathic pain syndrome, uterine fibroids, diverticulosis, anal fissure and tubular adenomatous colon polyps who presents for a evaluation of cough congestion.  01/28/23 patient seen by Dr. Carlie Otolaryngology. Fiberoptic exam of the pharynx and larynx is unremarkable.  At that time it was suspected her throat discomfort is due to reflux with recommendations to continue daily omeprazole .  In 1/25, she developed appendicitis and had uncomplicated laparoscopic appendectomy at [facility].   02/2024 cardiac follow-up on pulmonary hypertension.  Reviewed entire note. She is doing pulmonary rehab.   Interval History Patient last seen in the GI office on 01/16/2023 by Elida, NP for right throat pain.  Patient presents for evaluation of acute cough.  She reports she started having a cough on Friday that progressively got worse over the weekend.  Patient reports due to the amount of coughing she has been doing she has irritated the back of her throat and noticed some irritation and blood in her mucus.  Patient also has sinus congestion and stuffy nose with headache and watery eyes.  Patient reports feeling as if her tongue is enlarged and mouth is dry.  Patient thought it Tonya Odonnell be related to reflux therefore she started to take her omeprazole  40 mg p.o. daily.  Patient called our office and was instructed to increase to twice daily.  She has not noticed any improvement in the cough.  No exposure. No travel.   No nausea. No fever or chills.   Denies chest pain or shortness. No swelling in legs.  She reports her Uptravi  was recently increased from 800 mcg to 1000 mcg po daily. She skipped the pill last night and today to see if it was  related.   Patient was also recently started on sotatercept but when dose increased to goal, hgb rose to 17.5.  Sotatercept was then decreased to 0.3 mg/kg. Hgb still elevated so it is currently being held.   She reports she had food poisoning about a month ago. Since then she has had a lot of stomach bubbling with excessive flatulence.  She reports her stools are always loose, She has 2-3 loose stools per day, which has been going on since she started Uptravi  a year ago.  Had EGD several years ago in [place] . Reports normal and placed on omeprazole  for GERD.  Wt Readings from Last 3 Encounters:  03/14/24 180 lb (81.6 kg)  02/11/24 180 lb 3.2 oz (81.7 kg)  12/15/23 176 lb 3.2 oz (79.9 kg)    Past Medical History:  Diagnosis Date   Anemia    USE TO HAVE IT,NOT NOW,2015,2016   Arthritis    HIPS,KNEES,HANDS   Breast cancer in female (HCC)    Cancer (HCC)    LEFT   Hyperlipidemia    Hypertension    Low iron    Neuromuscular disorder (HCC)    MUSCLE PAIN,FIBROMYALGIA   Osteopenia    Pulmonary HTN (HCC)    PVC (premature ventricular contraction)    Thyroid  disease     Past Surgical History:  Procedure Laterality Date   LAPAROSCOPIC APPENDECTOMY N/A 03/27/2023   Procedure: APPENDECTOMY LAPAROSCOPIC;  Surgeon: Lyndel Deward PARAS, MD;  Location: [facility] OR;  Service: General;  Laterality: N/A;   Left Ovarian removal Left  MASTECTOMY Left    LYMPHNODES REMOVED   RIGHT HEART CATH N/A 07/02/2023   Procedure: RIGHT HEART CATH;  Surgeon: Rolan Ezra RAMAN, MD;  Location: [facility] INVASIVE CV LAB;  Service: Cardiovascular;  Laterality: N/A;   RIGHT/LEFT HEART CATH AND CORONARY ANGIOGRAPHY N/A 12/12/2022   Procedure: RIGHT/LEFT HEART CATH AND CORONARY ANGIOGRAPHY;  Surgeon: Darron Deatrice LABOR, MD;  Location: [facility] INVASIVE CV LAB;  Service: Cardiovascular;  Laterality: N/A;    Current Outpatient Medications  Medication Sig Dispense Refill   ambrisentan  (LETAIRIS ) 10 MG tablet Take 1 tablet  (10 mg total) by mouth daily. 30 tablet 11   Azelaic Acid  15 % gel Apply to face every morning 50 g 5   bismuth subsalicylate (PEPTO BISMOL) 262 MG/15ML suspension Take 30 mLs by mouth every 6 (six) hours as needed for diarrhea or loose stools.     clobetasol  cream (TEMOVATE ) 0.05 % Apply 1 Application topically as needed. 30 g 4   cromolyn  (OPTICROM ) 4 % ophthalmic solution Place 1 drop into both eyes 4 (four) times daily. 10 mL 4   ELIDEL  1 % cream Apply to red scaly areas on the face BID for seb derm 30 g 4   furosemide  (LASIX ) 40 MG tablet Take 1 tablet (40 mg total) by mouth daily. 90 tablet 1   hydrALAZINE  (APRESOLINE ) 25 MG tablet TAKE 1 TABLET BY MOUTH TWICE A DAY 180 tablet 3   Ivermectin  (SOOLANTRA ) 1 % CREA Apply 1 Application topically at bedtime. 45 g 5   levothyroxine  (SYNTHROID ) 112 MCG tablet Take 1 pill everyday except Sunday take half a pill 90 tablet 3   meclizine  (ANTIVERT ) 25 MG tablet Take 1 tablet (25 mg total) by mouth 3 (three) times daily as needed for dizziness. 30 tablet 0   meloxicam  (MOBIC ) 15 MG tablet Take 1 tablet (15 mg total) by mouth daily. 21 tablet 0   omeprazole  (PRILOSEC) 40 MG capsule TAKE 1 CAPSULE (40 MG TOTAL) BY MOUTH DAILY. 90 capsule 4   ondansetron  (ZOFRAN ) 4 MG tablet Take 1 tablet (4 mg total) by mouth every 8 (eight) hours as needed for nausea or vomiting. 15 tablet 0   potassium chloride  SA (KLOR-CON  M) 20 MEQ tablet Take 1 tablet (20 mEq total) by mouth daily. 90 tablet 1   Selexipag  (UPTRAVI ) 800 MCG TABS Take 1 tablet (800 mcg total) by mouth in the morning and at bedtime. 60 tablet 11   tadalafil , PAH, (ADCIRCA ) 20 MG tablet TAKE 2 TABLETS (40 MG TOTAL) BY MOUTH DAILY. 180 tablet 3   valACYclovir  (VALTREX ) 1000 MG tablet Take 1,000 mg by mouth daily as needed (Cold Sores).     fluticasone  (FLONASE ) 50 MCG/ACT nasal spray Place 2 sprays into both nostrils daily as needed for allergies or rhinitis. 16 g 5   No current facility-administered  medications for this visit.    Allergies as of 03/14/2024 - Review Complete 03/14/2024  Allergen Reaction Noted   Lisinopril Other (See Comments) 11/27/2013    Family History  Problem Relation Age of Onset   Hypertension Mother    Leukemia Mother    Stomach cancer Father    Colon cancer Neg Hx    Colon polyps Neg Hx    Crohn's disease Neg Hx    Esophageal cancer Neg Hx    Rectal cancer Neg Hx     Review of Systems:    Constitutional: No weight loss, fever, chills, weakness or fatigue HEENT: Eyes: No change in vision  Ears, Nose, Throat:  No change in hearing or congestion Skin: No rash or itching Cardiovascular: No chest pain, chest pressure or palpitations   Respiratory: No SOB or cough Gastrointestinal: See HPI and otherwise negative Genitourinary: No dysuria or change in urinary frequency Neurological: No headache, dizziness or syncope Musculoskeletal: No new muscle or joint pain Hematologic: No bleeding or bruising Psychiatric: No history of depression or anxiety    Physical Exam:  Vital signs: BP (!) 158/98   Pulse 92   Ht 5' 3 (1.6 m)   Wt 180 lb (81.6 kg)   LMP  (LMP Unknown)   SpO2 95%   BMI 31.89 kg/m   Constitutional:   Pleasant  female appears to be in NAD, Well developed, Well nourished, alert and cooperative Eyes:   PEERL, EOMI. No icterus. Conjunctiva pink. Neck:  Supple Throat: Oral cavity and pharynx without inflammation, swelling or lesion.  Respiratory: Respirations even and unlabored. Lungs clear to auscultation bilaterally.   No wheezes, crackles, or rhonchi.  Cardiovascular: Normal S1, S2. Regular rate and rhythm. No peripheral edema, cyanosis or pallor.  Gastrointestinal:  Soft, nondistended, nontender. No rebound or guarding. Hyperactive bowel sounds. No appreciable masses or hepatomegaly. Rectal:  Not performed.  Msk:  Symmetrical without gross deformities. Without edema, no deformity or joint abnormality.  Neurologic:   Alert and  oriented x4;  grossly normal neurologically.  Skin:   Dry and intact. Redness noted to face.  RELEVANT LABS AND IMAGING: CBC    Latest Ref Rng & Units 03/04/2024   10:15 AM 02/11/2024   12:34 PM 01/07/2024   10:40 AM  CBC  WBC 4.0 - 10.5 K/uL 6.2  6.8  6.8   Hemoglobin 12.0 - 15.0 g/dL 83.2  83.9  83.9   Hematocrit 36.0 - 46.0 % 49.1  47.3  47.7   Platelets 150 - 400 K/uL 333  345  327      CMP     Latest Ref Rng & Units 02/11/2024   12:34 PM 11/16/2023   10:22 AM 09/24/2023   10:00 AM  CMP  Glucose 70 - 99 mg/dL 895  848  884   BUN 6 - 20 mg/dL 15  13  16    Creatinine 0.44 - 1.00 mg/dL 9.27  9.17  9.18   Sodium 135 - 145 mmol/L 138  141  141   Potassium 3.5 - 5.1 mmol/L 3.5  3.7  3.5   Chloride 98 - 111 mmol/L 106  106  107   CO2 22 - 32 mmol/L 24  23  23    Calcium  8.9 - 10.3 mg/dL 8.7  9.2  9.6      Lab Results  Component Value Date   TSH 4.22 11/11/2023  ECHO 12/12/2022: Left ventricular ejection fraction, by estimation, is 50 to 55%. The left ventricle has low normal function. The left ventricle has no regional wall motion abnormalities. There is mild left ventricular hypertrophy. Left ventricular diastolic parameters are indeterminate. 1. Right ventricular systolic function is low normal. The right ventricular size is not well visualized. 2. 3. The mitral valve is normal in structure. No evidence of mitral valve regurgitation. 4. The aortic valve is tricuspid. Aortic valve regurgitation is not visualized.    Cardiac cath 12/12/2022: 1.  Normal coronary arteries. 2.  Left ventricular angiography was not performed.  EF was low normal by echo.  Normal left ventricular end-diastolic pressure. 3.  Right heart catheterization showed mildly elevated RA pressure, severe pulmonary hypertension, normal wedge  pressure and normal cardiac output.   RA: 11 mmHg RV: 78/6 mmHg PW: 10 mmHg PA: 76/38 with a mean of 52 mmHg.  Pulmonary vascular resistance is 7.3 Woods  units Cardiac output: 5.7 with an index of 3.04.   Recommendations: The patient has evidence of severe pulmonary arterial hypertension.  There is evidence of RV dysfunction on echocardiogram.  I consulted advanced heart failure to help with evaluation and management.   CTA 12/15/2022: FINDINGS: VASCULAR   Aorta: Normal caliber aorta without aneurysm, dissection, vasculitis or significant stenosis.   Celiac: Celiac axis is widely patent. No evidence of dissection. The lateral segmental branch of the left hepatic artery is replaced to the left gastric artery. 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   SMA: Patent without evidence of aneurysm, dissection, vasculitis or significant stenosis.   Renals: Both renal arteries are patent without evidence of aneurysm, dissection, vasculitis, fibromuscular dysplasia or significant stenosis.   IMA: Patent without evidence of aneurysm, dissection, vasculitis or significant stenosis.   Inflow: Patent without evidence of aneurysm, dissection, vasculitis or significant stenosis.   Proximal Outflow: Bilateral common femoral and visualized portions of the superficial and profunda femoral arteries are patent without evidence of aneurysm, dissection, vasculitis or significant stenosis.   Veins: No focal venous abnormality.   Review of the MIP images confirms the above findings.   NON-VASCULAR   Lower chest: No acute abnormality.   Hepatobiliary: No focal liver abnormality is seen. No gallstones, gallbladder wall thickening, or biliary dilatation.   Pancreas: Unremarkable. No pancreatic ductal dilatation or surrounding inflammatory changes.   Spleen: No splenic injury or perisplenic hematoma.   Adrenals/Urinary Tract: Adrenal glands are unremarkable. Kidneys are normal, without renal calculi, focal lesion, or hydronephrosis. Bladder is unremarkable.   Stomach/Bowel: Mild  scattered colonic diverticula without evidence of active inflammation. No focal bowel wall thickening or evidence of obstruction. Normal appendix in the right lower quadrant.   Lymphatic: No suspicious lymphadenopathy.   Reproductive: Approximately 2 cm exophytic uterine fibroid arising from the posterior aspect of the uterine fundus. No adnexal masses.   Other: No abdominal wall hernia or abnormality. No abdominopelvic ascites. No evidence of retroperitoneal hematoma.   Musculoskeletal: No acute or significant osseous findings.   IMPRESSION: VASCULAR   1. 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 No evidence of rupture. 2. No evidence of active bleeding.   NON-VASCULAR   1. Negative for retroperitoneal hematoma or bleeding within the abdomen or pelvis. 2. Scattered colonic diverticula without evidence of active diverticulitis. 3. Incidental note is made of a uterine fibroid.  03/2023 CTAP IMPRESSION: Findings consistent with acute appendicitis. No definite abscess formation is noted.   PAST GI PROCEDURES:   Colonoscopy 08/12/2021: - The examined portion of the ileum was normal.  - One 15 mm polyp in the transverse colon, removed with a hot snare. Resected and retrieved. Tattooed.  - One 8 mm polyp in the transverse colon, removed with a hot snare. Resected and retrieved.  - One 4 mm polyp in the transverse colon, removed with a cold snare. Resected and retrieved.  - Diverticulosis in the sigmoid colon.  - Non-bleeding internal hemorrhoids   Surgical [P], colon, transverse x2 HAIR SHAFT, transverse x1 CS, polyp (3) FINDINGS CONSISTENT WITH TUBULAR ADENOMA AND INFLAMMATORY POLYP (LARGER ONE). NO HIGH-GRADE DYSPLASIA OR MALIGNANCY IS SEEN.  01/21/2023 esophagus/barium swallow/tablet study IMPRESSION: Normal barium  study.  Assessment: Encounter Diagnoses  Name Primary?   Gastroesophageal reflux disease,  unspecified whether esophagitis present Yes   Acute cough    Sinus congestion    Post-nasal drip    Altered bowel habits    59 year old female patient presents with acute cough, sinus congestion, and postnasal drip started on Friday.  History of GERD therefore she restarted her PPI therapy. No improvement in cough. I did explain to her it often takes more than a few days of being on PPI therapy with strict GERD diet to notice improvement if related.  She also has obvious symptoms of seasonal allergies and/or URI.  Reports she ran out of her Flonase  and I recommended she restart it again for the sinus congestion. Recently had changes to her heart medications as well, which is important to consider.  9/24 Ultrasound of thyroid  gland showed no nodules or mass. 01/2023 Barium swallow test showed no abnormalities.  08/2023 CT angio head and neck normal. Last EGD several years ago. Reevaluate in a few weeks and if no improvement would consider endoscopic procedure.    Also reports having loose stool since she started Uptravi  for past year which is a common side effect. Last colonoscopy 6/23 with multiple polyps removed. Recall 08/2026. CTAP 03/2023 with appendicitis otherwise normal.   Plan: -Continue omeprazole  40 mg twice daily -Recommend Strict GERD diet, no late meals  -recommend restarting Flonase  for her allergies -if no improvement in cough while on PPI therapy let our office know and we will consider endoscopic procedures to further evaluate -Messaged both her PCP and cardiologist as well to notify them of current symptoms  Thank you for the courtesy of this consult. Please call me with any questions or concerns.   Tonya Skolnik, FNP-C [facility] 03/14/2024, 3:59 PM  Cc: Valerio Melanie DASEN, NP  "

## 2024-03-14 NOTE — Telephone Encounter (Signed)
 Inbound call from patient stating that she was no satisfied with seeing Deanna May today due to her telling her one thing and the discharge saying something else. Patient was extremely offended by the [facility] May leaving the room and not returning back to further explain. Patient is wishing for Dr. Federico or the nurse to call her back and discuss further her extreme cough and post nasal drip that causes her to feel like cough and short of breathe. Please advise.

## 2024-03-14 NOTE — Patient Instructions (Addendum)
 GERD Recommend GERD diet Continue omeprazole  40 mg twice daily  Sinus congestion If symptoms worsen please contact your PCP  Cough If symptoms worsen please contact your PCP  Will contact your cardiologist and discuss   _______________________________________________________  If your blood pressure at your visit was 140/90 or greater, please contact your primary care physician to follow up on this.  _______________________________________________________  If you are age 12 or older, your body mass index should be between 23-30. Your Body mass index is 31.89 kg/m. If this is out of the aforementioned range listed, please consider follow up with your Primary Care Provider.  If you are age 51 or younger, your body mass index should be between 19-25. Your Body mass index is 31.89 kg/m. If this is out of the aformentioned range listed, please consider follow up with your Primary Care Provider.   ________________________________________________________  The [facility] providers would like to encourage you to use MYCHART to communicate with providers for non-urgent requests or questions.  Due to long hold times on the telephone, sending your provider a message by [facility] may be a faster and more efficient way to get a response.  Please allow 48 business hours for a response.  Please remember that this is for non-urgent requests.  _______________________________________________________  [facility] is using a team-based approach to care.  Your team is made up of your doctor and two to three APPS. Our APPS (Nurse Practitioners and Physician Assistants) work with your physician to ensure care continuity for you. They are fully qualified to address your health concerns and develop a treatment plan. They communicate directly with your gastroenterologist to care for you. Seeing the Advanced Practice Practitioners on your physician's team can help you by facilitating care more promptly,  often allowing for earlier appointments, access to diagnostic testing, procedures, and other specialty referrals.   Thank you for trusting me with your gastrointestinal care. Deanna May, FNP-C

## 2024-03-14 NOTE — Telephone Encounter (Signed)
 Call from pt stating she is experiencing acid reflux. Pt stated it is difficult to breath.  Appointment scheduled 03/18/2024. Pt requesting medical advice. Please advise thank you.

## 2024-03-15 ENCOUNTER — Encounter

## 2024-03-15 DIAGNOSIS — R051 Acute cough: Secondary | ICD-10-CM

## 2024-03-15 MED ORDER — BENZONATATE 100 MG PO CAPS: 100.0000 mg | ORAL_CAPSULE | Freq: Three times a day (TID) | ORAL | 0 refills | Status: AC | PRN

## 2024-03-15 NOTE — Progress Notes (Signed)
 I agree with the assessment and plan as outlined by Ms. May.

## 2024-03-15 NOTE — Telephone Encounter (Signed)
 Chart reviewed and noted where Tonya May NP responded to pt via my chart in regard to her post nasal drip and cough.

## 2024-03-15 NOTE — Telephone Encounter (Signed)
 Called patient to follow-up on her issues. She was upset I did not give her prescribed cough medicine and something for gas. Notified her I had to speak with her PCP first prior to prescribing anything as we don't typically prescribe things for cough. Patient reports last night she felt very strange with chills and goose bumps.  She thinks maybe she has some type of viral infection.  I will go ahead and send her short prescription of Tessalon  Perles however reinforced the importance of her following up with her PCP for the sinus congestion and post nasal drip. Also instructed her to drink plenty of fluids and rest.  Patient also expresses concern for excessive gas.  Given that history of diarrhea and current symptoms we will go ahead and order stool studies to rule out viral, parasitic or bacterial infection. Offered dicyclomine, pt declined.

## 2024-03-16 ENCOUNTER — Encounter (HOSPITAL_COMMUNITY): Payer: Self-pay | Admitting: Cardiology

## 2024-03-16 NOTE — Telephone Encounter (Signed)
 Pt aware Reports she is doing much better Reports she also feels like a virus,symptoms are getting much better

## 2024-03-17 ENCOUNTER — Encounter

## 2024-03-17 ENCOUNTER — Telehealth: Payer: Self-pay

## 2024-03-17 NOTE — Telephone Encounter (Signed)
-----   Message from [facility], NP sent at 03/15/2024 10:25 AM EST ----- Regarding: orders Please order below-  Diarrhea/Flatulence:  Please order--- GI profile with cdiff OTC gas -x or simethicone   Cough  Tessalon  pearles short script   Deanna, NP

## 2024-03-18 ENCOUNTER — Ambulatory Visit: Admitting: Internal Medicine

## 2024-03-21 ENCOUNTER — Telehealth: Payer: Self-pay

## 2024-03-21 NOTE — Telephone Encounter (Signed)
-----   Message from [facility], NP sent at 03/15/2024 10:38 AM EST ----- Schedule follow-up with her and dr dr federico next available.  Deanna, NP

## 2024-03-22 ENCOUNTER — Encounter

## 2024-03-23 ENCOUNTER — Other Ambulatory Visit: Payer: Self-pay

## 2024-03-23 ENCOUNTER — Encounter: Payer: Self-pay | Admitting: *Deleted

## 2024-03-23 DIAGNOSIS — R194 Change in bowel habit: Secondary | ICD-10-CM

## 2024-03-23 DIAGNOSIS — R195 Other fecal abnormalities: Secondary | ICD-10-CM

## 2024-03-23 DIAGNOSIS — I2721 Secondary pulmonary arterial hypertension: Secondary | ICD-10-CM

## 2024-03-23 NOTE — Progress Notes (Signed)
 Pulmonary Individual Treatment Plan  Patient Details  Name: Tonya Odonnell MRN: [id number] Date of Birth: 01/22/1966 Referring Provider:   Conrad Ports Pulmonary Rehab from 12/15/2023 in [facility] Cardiac and Pulmonary Rehab  Referring Provider Ezra Shuck    Initial Encounter Date:  Flowsheet Row Pulmonary Rehab from 12/15/2023 in [facility] Cardiac and Pulmonary Rehab  Date 12/15/23    Visit Diagnosis: Pulmonary artery hypertension (HCC)  Patient's Home Medications on Admission: Current Medications[1]  Past Medical History: Past Medical History:  Diagnosis Date   Anemia    USE TO HAVE IT,NOT NOW,2015,2016   Arthritis    HIPS,KNEES,HANDS   Breast cancer in female [facility])    Cancer (HCC)    LEFT   Hyperlipidemia    Hypertension    Low iron    Neuromuscular disorder (HCC)    MUSCLE PAIN,FIBROMYALGIA   Osteopenia    Pulmonary HTN (HCC)    PVC (premature ventricular contraction)    Thyroid  disease     Tobacco Use: Tobacco Use History[2]  Labs: Review Flowsheet  More data exists      Latest Ref Rng & Units 12/12/2022 12/22/2022 07/02/2023 08/19/2023 08/28/2023  Labs for ITP Cardiac and Pulmonary Rehab  Cholestrol 100 - 199 mg/dL - - - 723  -  LDL (calc) 0 - 99 mg/dL - - - 814  -  HDL-C >60 mg/dL - - - 73  -  Trlycerides 0 - 149 mg/dL - - - 892  -  Hemoglobin A1c 4.8 - 5.6 % - 5.7  - 5.2  -  PH, Arterial 7.35 - 7.45 7.399  - - - -  PCO2 arterial 32 - 48 mmHg 35.2  - - - -  Bicarbonate 20.0 - 28.0 mmol/L 21.8  24.0  - 23.9  23.5  - -  TCO2 22 - 32 mmol/L 23  25  - 25  25  - 22   Acid-base deficit 0.0 - 2.0 mmol/L 2.0  1.0  - 1.0  2.0  - -  O2 Saturation % 94  66  - 71  71  - -    Details       Multiple values from one day are sorted in reverse-chronological order          Pulmonary Assessment Scores:  Pulmonary Assessment Scores     Row Name 12/15/23 1506         ADL UCSD   ADL Phase Entry     SOB Score total 95     Rest 4     Walk 4     Stairs 4     Bath  4     Dress 3     Shop 4       CAT Score   CAT Score 28       mMRC Score   mMRC Score 4        UCSD: Self-administered rating of dyspnea associated with activities of daily living (ADLs) 6-point scale (0 = not at all to 5 = maximal or unable to do because of breathlessness)  Scoring Scores range from 0 to 120.  Minimally important difference is 5 units  CAT: CAT can identify the health impairment of COPD patients and is better correlated with disease progression.  CAT has a scoring range of zero to 40. The CAT score is classified into four groups of low (less than 10), medium (10 - 20), high (21-30) and very high (31-40) based on the impact level of disease on  health status. A CAT score over 10 suggests significant symptoms.  A worsening CAT score could be explained by an exacerbation, poor medication adherence, poor inhaler technique, or progression of COPD or comorbid conditions.  CAT MCID is 2 points  mMRC: mMRC (Modified Medical Research Council) Dyspnea Scale is used to assess the degree of baseline functional disability in patients of respiratory disease due to dyspnea. No minimal important difference is established. A decrease in score of 1 point or greater is considered a positive change.   Pulmonary Function Assessment:   Exercise Target Goals: Exercise Program Goal: Individual exercise prescription set using results from initial 6 min walk test and THRR while considering  patients activity barriers and safety.   Exercise Prescription Goal: Initial exercise prescription builds to 30-45 minutes a day of aerobic activity, 2-3 days per week.  Home exercise guidelines will be given to patient during program as part of exercise prescription that the participant will acknowledge.  Education: Aerobic Exercise: - Group verbal and visual presentation on the components of exercise prescription. Introduces F.I.T.T principle from ACSM for exercise prescriptions.  Reviews  F.I.T.T. principles of aerobic exercise including progression. Written material provided at class time.   Education: Resistance Exercise: - Group verbal and visual presentation on the components of exercise prescription. Introduces F.I.T.T principle from ACSM for exercise prescriptions  Reviews F.I.T.T. principles of resistance exercise including progression. Written material provided at class time.    Education: Exercise & Equipment Safety: - Individual verbal instruction and demonstration of equipment use and safety with use of the equipment. Flowsheet Row Pulmonary Rehab from 12/15/2023 in [facility] Cardiac and Pulmonary Rehab  Date 12/15/23  Educator [facility]  Instruction Review Code 1- Verbalizes Understanding    Education: Exercise Physiology & General Exercise Guidelines: - Group verbal and written instruction with models to review the exercise physiology of the cardiovascular system and associated critical values. Provides general exercise guidelines with specific guidelines to those with heart or lung disease.    Education: Flexibility, Balance, Mind/Body Relaxation: - Group verbal and visual presentation with interactive activity on the components of exercise prescription. Introduces F.I.T.T principle from ACSM for exercise prescriptions. Reviews F.I.T.T. principles of flexibility and balance exercise training including progression. Also discusses the mind body connection.  Reviews various relaxation techniques to help reduce and manage stress (i.e. Deep breathing, progressive muscle relaxation, and visualization). Balance handout provided to take home. Written material provided at class time.   Activity Barriers & Risk Stratification:  Activity Barriers & Cardiac Risk Stratification - 12/15/23 1459       Activity Barriers & Cardiac Risk Stratification   Activity Barriers Back Problems;Fibromyalgia;Other (comment)    Comments Vertigo          6 Minute Walk:  6 Minute Walk     Row  Name 12/15/23 1456         6 Minute Walk   Phase Initial     Distance 1220 feet     Walk Time 6 minutes     # of Rest Breaks 0     MPH 2.3     METS 3.34     RPE 7     Perceived Dyspnea  1     VO2 Peak 11.7     Symptoms No     Resting HR 97 bpm     Resting BP 134/80     Resting Oxygen Saturation  93 %     Exercise Oxygen Saturation  during 6 min walk 92 %  Max Ex. HR 111 bpm     Max Ex. BP 132/82     2 Minute Post BP 124/80       Interval HR   1 Minute HR 97     2 Minute HR 115     3 Minute HR 101     4 Minute HR 104     5 Minute HR 111     6 Minute HR 109     2 Minute Post HR 100     Interval Heart Rate? Yes       Interval Oxygen   Interval Oxygen? Yes     Baseline Oxygen Saturation % 93 %     1 Minute Oxygen Saturation % 93 %     1 Minute Liters of Oxygen 0 L     2 Minute Oxygen Saturation % 92 %     2 Minute Liters of Oxygen 0 L     3 Minute Oxygen Saturation % 94 %     3 Minute Liters of Oxygen 0 L     4 Minute Oxygen Saturation % 96 %     4 Minute Liters of Oxygen 0 L     5 Minute Oxygen Saturation % 93 %     5 Minute Liters of Oxygen 0 L     6 Minute Oxygen Saturation % 95 %     6 Minute Liters of Oxygen 0 L     2 Minute Post Oxygen Saturation % 96 %     2 Minute Post Liters of Oxygen 0 L       Oxygen Initial Assessment:  Oxygen Initial Assessment - 12/10/23 1414       Home Oxygen   Home Oxygen Device None    Sleep Oxygen Prescription None    Home Exercise Oxygen Prescription None    Home Resting Oxygen Prescription None      Intervention   Short Term Goals To learn and understand importance of maintaining oxygen saturations>88%;To learn and understand importance of monitoring SPO2 with pulse oximeter and demonstrate accurate use of the pulse oximeter.;To learn and demonstrate proper use of respiratory medications;To learn and demonstrate proper pursed lip breathing techniques or other breathing techniques.     Long  Term Goals Exhibits proper  breathing techniques, such as pursed lip breathing or other method taught during program session;Maintenance of O2 saturations>88%;Verbalizes importance of monitoring SPO2 with pulse oximeter and return demonstration;Demonstrates proper use of MDIs;Compliance with respiratory medication          Oxygen Re-Evaluation:  Oxygen Re-Evaluation     Row Name 12/17/23 0921 01/26/24 1259 02/23/24 1118         Program Oxygen Prescription   Program Oxygen Prescription -- None None       Home Oxygen   Home Oxygen Device -- None None     Sleep Oxygen Prescription -- None None     Home Exercise Oxygen Prescription -- None None     Home Resting Oxygen Prescription -- None None     Compliance with Home Oxygen Use -- Yes Yes       Goals/Expected Outcomes   Short Term Goals -- To learn and understand importance of maintaining oxygen saturations>88%;To learn and understand importance of monitoring SPO2 with pulse oximeter and demonstrate accurate use of the pulse oximeter. To learn and demonstrate proper pursed lip breathing techniques or other breathing techniques.      Long  Term Goals -- Exhibits proper breathing  techniques, such as pursed lip breathing or other method taught during program session;Maintenance of O2 saturations>88%;Verbalizes importance of monitoring SPO2 with pulse oximeter and return demonstration Exhibits proper breathing techniques, such as pursed lip breathing or other method taught during program session     Comments Reviewed PLB technique with pt.  Talked about how it works and it's importance in maintaining their exercise saturations. Brizza has been monitoring her pulse ox when she does not feel the best. She states that if she monitored it frequently, it would cause more stress. She is aware of her shortness of breath symptoms and manages it with pursed lip breathing when possible. She notes that she feels like her oxygen saturation in class has been better than when she first  started. Encouraged patient to monitor pulse ox when feeling well to establish a baseline Gavrielle has a pulse ox to monitor when she is not feeling great. Discussed PLB and when best to use it.     Goals/Expected Outcomes Short: Become more profiecient at using PLB.   Long: Become independent at using PLB. Short: monitor pulse ox while in class and at home to establish a baseline. Long: independenlty manage shortness of breath Short: monitor pulse ox while in class and at home to establish a baseline. Long: independenlty manage shortness of breath        Oxygen Discharge (Final Oxygen Re-Evaluation):  Oxygen Re-Evaluation - 02/23/24 1118       Program Oxygen Prescription   Program Oxygen Prescription None      Home Oxygen   Home Oxygen Device None    Sleep Oxygen Prescription None    Home Exercise Oxygen Prescription None    Home Resting Oxygen Prescription None    Compliance with Home Oxygen Use Yes      Goals/Expected Outcomes   Short Term Goals To learn and demonstrate proper pursed lip breathing techniques or other breathing techniques.     Long  Term Goals Exhibits proper breathing techniques, such as pursed lip breathing or other method taught during program session    Comments Santresa has a pulse ox to monitor when she is not feeling great. Discussed PLB and when best to use it.    Goals/Expected Outcomes Short: monitor pulse ox while in class and at home to establish a baseline. Long: independenlty manage shortness of breath          Initial Exercise Prescription:  Initial Exercise Prescription - 12/15/23 1400       Date of Initial Exercise RX and Referring Provider   Date 12/15/23    Referring Provider Ezra Shuck      Oxygen   Maintain Oxygen Saturation 88% or higher      Recumbant Bike   Level 3    RPM 50    Watts 25    Minutes 15    METs 3.3      NuStep   Level 3    SPM 80    Minutes 15    METs 3.3      Arm Ergometer   Level 3    Watts 25    RPM 50     Minutes 15    METs 3.3      REL-XR   Level 3    Watts 25    Speed 50    Minutes 15    METs 3.3      Prescription Details   Duration Progress to 30 minutes of continuous aerobic without signs/symptoms of physical  distress      Intensity   THRR 40-80% of Max Heartrate 123-149    Ratings of Perceived Exertion 11-13    Perceived Dyspnea 0-4      Progression   Progression Continue to progress workloads to maintain intensity without signs/symptoms of physical distress.      Resistance Training   Training Prescription Yes    Weight 3lb    Reps 10-15          Perform Capillary Blood Glucose checks as needed.  Exercise Prescription Changes:   Exercise Prescription Changes     Row Name 12/15/23 1500 12/22/23 1400 01/21/24 1100 02/01/24 1100 02/17/24 0700     Response to Exercise   Blood Pressure (Admit) 134/80 102/64 108/70 124/68 106/74   Blood Pressure (Exercise) 132/82 108/68 134/66 144/74 150/80   Blood Pressure (Exit) 124/80 100/58 100/60 116/68 106/72   Heart Rate (Admit) 97 bpm 104 bpm 113 bpm 111 bpm 90 bpm   Heart Rate (Exercise) 115 bpm 141 bpm 128 bpm 130 bpm 113 bpm   Heart Rate (Exit) 100 bpm 106 bpm 110 bpm 120 bpm 92 bpm   Oxygen Saturation (Admit) 93 % 90 % 93 % 94 % 90 %   Oxygen Saturation (Exercise) 92 % 95 % 90 % 93 % 91 %   Oxygen Saturation (Exit) 96 % 96 % 93 % 90 % 93 %   Rating of Perceived Exertion (Exercise) 7 14 15 13 13    Perceived Dyspnea (Exercise) 1 1 1  0 0   Symptoms none none none none none   Comments results 1st day of exercise -- -- --   Duration -- Progress to 30 minutes of  aerobic without signs/symptoms of physical distress Progress to 30 minutes of  aerobic without signs/symptoms of physical distress Progress to 30 minutes of  aerobic without signs/symptoms of physical distress Progress to 30 minutes of  aerobic without signs/symptoms of physical distress   Intensity -- THRR unchanged THRR unchanged THRR unchanged THRR unchanged      Progression   Progression -- Continue to progress workloads to maintain intensity without signs/symptoms of physical distress. Continue to progress workloads to maintain intensity without signs/symptoms of physical distress. Continue to progress workloads to maintain intensity without signs/symptoms of physical distress. Continue to progress workloads to maintain intensity without signs/symptoms of physical distress.   Average METs -- 3.1 2.66 2.85 2.3     Resistance Training   Training Prescription -- Yes Yes Yes Yes   Weight -- 3lb 3lb 3lb 3lb   Reps -- 10-15 10-15 10-15 10-15     Interval Training   Interval Training -- No No No No     Recumbant Bike   Level -- 3 2 1  --   Watts -- 32 22 19 --   Minutes -- 15 15 15  --   METs -- -- 2.2 2.85 --     NuStep   Level -- 2 3 -- 5   Minutes -- 15 15 -- 15   METs -- 3.1 3.1 -- 3.2     Arm Ergometer   Level -- -- 2 -- --   Watts -- -- 25 -- --   Minutes -- -- 15 -- --   METs -- -- 2.62 -- --     REL-XR   Level -- -- 3 -- 2   Minutes -- -- 15 -- 15   METs -- -- 4.2 -- 3     Biostep-RELP  Level -- -- -- 2 2   Minutes -- -- -- 15 15     Oxygen   Maintain Oxygen Saturation 88% or higher 88% or higher 88% or higher 88% or higher 88% or higher    Row Name 03/07/24 1400 03/23/24 0800           Response to Exercise   Blood Pressure (Admit) 122/76 116/64      Blood Pressure (Exercise) 148/60 --      Blood Pressure (Exit) 116/60 106/62      Heart Rate (Admit) 75 bpm 99 bpm      Heart Rate (Exercise) 119 bpm 112 bpm      Heart Rate (Exit) 97 bpm 101 bpm      Oxygen Saturation (Admit) 92 % 92 %      Oxygen Saturation (Exercise) 92 % 88 %      Oxygen Saturation (Exit) 93 % 91 %      Rating of Perceived Exertion (Exercise) 13 13      Perceived Dyspnea (Exercise) 0 0      Symptoms none none      Duration Progress to 30 minutes of  aerobic without signs/symptoms of physical distress Progress to 30 minutes of  aerobic without  signs/symptoms of physical distress      Intensity THRR unchanged THRR unchanged        Progression   Progression Continue to progress workloads to maintain intensity without signs/symptoms of physical distress. Continue to progress workloads to maintain intensity without signs/symptoms of physical distress.      Average METs 2.6 2.16        Resistance Training   Weight 3lb 3lb      Reps 10-15 10-15        Interval Training   Interval Training No No        Recumbant Bike   Level 3 5      Watts 30 30      Minutes 15 15      METs 3.15 3.14        NuStep   Level 5 --      Minutes 15 --      METs 2.4 --        Arm Ergometer   Level 1 --      Watts 25 --      Minutes 15 --      METs 2.61 --        REL-XR   Level -- 4      Minutes -- 15      METs -- 2        T5 Nustep   Level -- 1      Minutes -- 15      METs -- 1.5        Biostep-RELP   Level -- 1      Minutes -- 15      METs -- 2        Oxygen   Maintain Oxygen Saturation 88% or higher 88% or higher         Exercise Comments:   Exercise Comments     Row Name 12/17/23 0919           Exercise Comments First full day of exercise!  Patient was oriented to gym and equipment including functions, settings, policies, and procedures.  Patient's individual exercise prescription and treatment plan were reviewed.  All starting workloads were established based on the results of the 6  minute walk test done at initial orientation visit.  The plan for exercise progression was also introduced and progression will be customized based on patient's performance and goals.          Exercise Goals and Review:   Exercise Goals     Row Name 12/15/23 1505             Exercise Goals   Increase Physical Activity Yes       Intervention Provide advice, education, support and counseling about physical activity/exercise needs.;Develop an individualized exercise prescription for aerobic and resistive training based on initial  evaluation findings, risk stratification, comorbidities and participant's personal goals.       Expected Outcomes Short Term: Attend rehab on a regular basis to increase amount of physical activity.;Long Term: Add in home exercise to make exercise part of routine and to increase amount of physical activity.;Long Term: Exercising regularly at least 3-5 days a week.       Increase Strength and Stamina Yes       Intervention Provide advice, education, support and counseling about physical activity/exercise needs.;Develop an individualized exercise prescription for aerobic and resistive training based on initial evaluation findings, risk stratification, comorbidities and participant's personal goals.       Expected Outcomes Short Term: Increase workloads from initial exercise prescription for resistance, speed, and METs.;Short Term: Perform resistance training exercises routinely during rehab and add in resistance training at home;Long Term: Improve cardiorespiratory fitness, muscular endurance and strength as measured by increased METs and functional capacity ( )       Able to understand and use rate of perceived exertion (RPE) scale Yes       Intervention Provide education and explanation on how to use RPE scale       Expected Outcomes Short Term: Able to use RPE daily in rehab to express subjective intensity level;Long Term:  Able to use RPE to guide intensity level when exercising independently       Able to understand and use Dyspnea scale Yes       Intervention Provide education and explanation on how to use Dyspnea scale       Expected Outcomes Short Term: Able to use Dyspnea scale daily in rehab to express subjective sense of shortness of breath during exertion;Long Term: Able to use Dyspnea scale to guide intensity level when exercising independently       Knowledge and understanding of Target Heart Rate Range (THRR) Yes       Intervention Provide education and explanation of THRR including how  the numbers were predicted and where they are located for reference       Expected Outcomes Short Term: Able to state/look up THRR;Short Term: Able to use daily as guideline for intensity in rehab;Long Term: Able to use THRR to govern intensity when exercising independently       Able to check pulse independently Yes       Intervention Provide education and demonstration on how to check pulse in carotid and radial arteries.;Review the importance of being able to check your own pulse for safety during independent exercise       Expected Outcomes Short Term: Able to explain why pulse checking is important during independent exercise;Long Term: Able to check pulse independently and accurately       Understanding of Exercise Prescription Yes       Intervention Provide education, explanation, and written materials on patient's individual exercise prescription       Expected Outcomes Short  Term: Able to explain program exercise prescription;Long Term: Able to explain home exercise prescription to exercise independently          Exercise Goals Re-Evaluation :  Exercise Goals Re-Evaluation     Row Name 12/17/23 9078 12/22/23 1453 01/07/24 1637 01/21/24 1102 02/01/24 1123     Exercise Goal Re-Evaluation   Exercise Goals Review Increase Physical Activity;Able to understand and use rate of perceived exertion (RPE) scale;Knowledge and understanding of Target Heart Rate Range (THRR);Understanding of Exercise Prescription;Increase Strength and Stamina;Able to understand and use Dyspnea scale;Able to check pulse independently Increase Physical Activity;Understanding of Exercise Prescription;Increase Strength and Stamina Increase Physical Activity;Understanding of Exercise Prescription;Increase Strength and Stamina Increase Physical Activity;Understanding of Exercise Prescription;Increase Strength and Stamina Increase Physical Activity;Understanding of Exercise Prescription;Increase Strength and Stamina   Comments  Reviewed RPE and dyspnea scale, THR and program prescription with pt today.  Pt voiced understanding and was given a copy of goals to take home. Karenna is off to a good start in the program and completed her first day in this review. She worked at level 3 on the recumbent bike with 32 watts and level 2 on the T4 nustep. We will continue to monitor her progress in the program. Ryllie has not attended since 12/17/23. We will be in touch with her to determine when she will return. Elpidia is doing well in rehab. She was recently able to increase from level 2 to 3 on the T4 nustep, as well as maintain level 3 on the XR. We will continue to monitor her progress in the program. Paizlie was only able to attend 1 session during this review period. During her one session she was able to use the Biostep at level 2, and the recumbent bike at level 1. We will continue to monitor her progress in the program.   Expected Outcomes Short: Use RPE daily to regulate intensity.  Long: Follow program prescription in THR. Short: Continue to follow current exercise prescription. Long: Continue exercise to improve strength and stamina. Short: Return to rehab. Long: Continue exercise to improve strength and stamina. Short: Continue to follow exercise prescription. Long: Continue exercise to improve strength and stamina. Short: Return to level 3 on the recumbent bike. Long: Continue exercise to improve strength and stamina.    Row Name 02/17/24 9260 02/23/24 1115 03/07/24 1410 03/23/24 0852       Exercise Goal Re-Evaluation   Exercise Goals Review Increase Physical Activity;Understanding of Exercise Prescription;Increase Strength and Stamina Increase Physical Activity;Increase Strength and Stamina;Understanding of Exercise Prescription Increase Physical Activity;Increase Strength and Stamina;Understanding of Exercise Prescription Increase Physical Activity;Increase Strength and Stamina;Understanding of Exercise Prescription    Comments Sherre is  doing well in rehab. She was recently able to increase from level 3 to 5 on the T4 nustep. She was also able to maintain level 2 on both the XR and biostep. We will continue to monitor her progress in the program. Damonica is doing well in rehab. She is not doing much at home, she is walking her two dogs. Encouraged her to continue to walk her dogs and attend rehab. Jnai continues to do well in rehab. She was able to maintain level 5 on the T4 nustep, and level 1 on the arm ergometer. She was able to increase from level 1 to level 3 on the recumbent bike. We will continue to monitor her progress in the program. Sylva continues to do well in rehab. She increased to level 5 on the recumbent bike and  level 4 on the XR. She maintained level 1 on the biostep and T5 nustep. We will continue to monitor her progress in the program.    Expected Outcomes Short: Continue to increase level on T4 nustep. Long: Contninue exercise to improve strength and stamina. STg: Attend rehab regularly. LTG: Continue to exercise to improve strength and stamina Short: Continue to increase recumbent bike workload. Long: Continue exercise to improve strength and stamina. Short: Continue to increase recumbent bike and XR workloads. Long: Continue exercise to improve strength and stamina.       Discharge Exercise Prescription (Final Exercise Prescription Changes):  Exercise Prescription Changes - 03/23/24 0800       Response to Exercise   Blood Pressure (Admit) 116/64    Blood Pressure (Exit) 106/62    Heart Rate (Admit) 99 bpm    Heart Rate (Exercise) 112 bpm    Heart Rate (Exit) 101 bpm    Oxygen Saturation (Admit) 92 %    Oxygen Saturation (Exercise) 88 %    Oxygen Saturation (Exit) 91 %    Rating of Perceived Exertion (Exercise) 13    Perceived Dyspnea (Exercise) 0    Symptoms none    Duration Progress to 30 minutes of  aerobic without signs/symptoms of physical distress    Intensity THRR unchanged      Progression    Progression Continue to progress workloads to maintain intensity without signs/symptoms of physical distress.    Average METs 2.16      Resistance Training   Weight 3lb    Reps 10-15      Interval Training   Interval Training No      Recumbant Bike   Level 5    Watts 30    Minutes 15    METs 3.14      REL-XR   Level 4    Minutes 15    METs 2      T5 Nustep   Level 1    Minutes 15    METs 1.5      Biostep-RELP   Level 1    Minutes 15    METs 2      Oxygen   Maintain Oxygen Saturation 88% or higher          Nutrition:  Target Goals: Understanding of nutrition guidelines, daily intake of sodium 1500mg , cholesterol 200mg , calories 30% from fat and 7% or less from saturated fats, daily to have 5 or more servings of fruits and vegetables.  Education: Nutrition 1 -Group instruction provided by verbal, written material, interactive activities, discussions, models, and posters to present general guidelines for heart healthy nutrition including macronutrients, label reading, and promoting whole foods over processed counterparts. Education serves as pensions consultant of discussion of heart healthy eating for all. Written material provided at class time.     Education: Nutrition 2 -Group instruction provided by verbal, written material, interactive activities, discussions, models, and posters to present general guidelines for heart healthy nutrition including sodium, cholesterol, and saturated fat. Providing guidance of habit forming to improve blood pressure, cholesterol, and body weight. Written material provided at class time.     Biometrics:  Pre Biometrics - 12/15/23 1505       Pre Biometrics   Height 5' 3.5 (1.613 m)    Weight 176 lb 3.2 oz (79.9 kg)    Waist Circumference 38 inches    Hip Circumference 42 inches    Waist to Hip Ratio 0.9 %    BMI (Calculated) 30.72  Single Leg Stand 29.75 seconds           Nutrition Therapy Plan and Nutrition Goals:   Nutrition Therapy & Goals - 01/26/24 1302       Nutrition Therapy   RD appointment deferred Yes          Nutrition Assessments:  MEDIFICTS Score Key: >=70 Need to make dietary changes  40-70 Heart Healthy Diet <= 40 Therapeutic Level Cholesterol Diet   Picture Your Plate Scores: <59 Unhealthy dietary pattern with much room for improvement. 41-50 Dietary pattern unlikely to meet recommendations for good health and room for improvement. 51-60 More healthful dietary pattern, with some room for improvement.  >60 Healthy dietary pattern, although there may be some specific behaviors that could be improved.   Nutrition Goals Re-Evaluation:  Nutrition Goals Re-Evaluation     Row Name 02/23/24 1122             Goals   Comment Spoke with Dannie about eating smaller nutreint dense meals and making sure she meets her protein goals. She says she will work on it, but says it can be challenging some days becasue she is not hungery or her stomach is upset.       Expected Outcome STG: Small frequent meals with protein rich foods. LTG: Eat a healthy diet meeting increased needs for healing.          Nutrition Goals Discharge (Final Nutrition Goals Re-Evaluation):  Nutrition Goals Re-Evaluation - 02/23/24 1122       Goals   Comment Spoke with Nychelle about eating smaller nutreint dense meals and making sure she meets her protein goals. She says she will work on it, but says it can be challenging some days becasue she is not hungery or her stomach is upset.    Expected Outcome STG: Small frequent meals with protein rich foods. LTG: Eat a healthy diet meeting increased needs for healing.          Psychosocial: Target Goals: Acknowledge presence or absence of significant depression and/or stress, maximize coping skills, provide positive support system. Participant is able to verbalize types and ability to use techniques and skills needed for reducing stress and depression.   Education:  Stress, Anxiety, and Depression - Group verbal and visual presentation to define topics covered.  Reviews how body is impacted by stress, anxiety, and depression.  Also discusses healthy ways to reduce stress and to treat/manage anxiety and depression.  Written material provided at class time.   Education: Sleep Hygiene -Provides group verbal and written instruction about how sleep can affect your health.  Define sleep hygiene, discuss sleep cycles and impact of sleep habits. Review good sleep hygiene tips.    Initial Review & Psychosocial Screening:  Initial Psych Review & Screening - 12/10/23 1408       Initial Review   Current issues with None Identified      Family Dynamics   Good Support System? Yes   son, ex-husband, social worker   Comments Leroy states that she previously attended pulmonary rehab, however she had to discharge early due to difficulty with veritgo. She reports that these symptoms are more well controlled at this time, however she should avoid the treadmill and walking in circles. Rylyn states that she is seeing a spine specialist due to chronic back issues and that they are talking about physical therapy to improve pain and mobility, however this has not been set up at this time. She denies any mental health concerns  and states that she has a good support system, consisting of her son and ex-husband. Patria does want to take advantage of the dietician services to help balance her diet.      Barriers   Psychosocial barriers to participate in program There are no identifiable barriers or psychosocial needs.;The patient should benefit from training in stress management and relaxation.      Screening Interventions   Interventions Encouraged to exercise;To provide support and resources with identified psychosocial needs;Provide feedback about the scores to participant    Expected Outcomes Short Term goal: Utilizing psychosocial counselor, staff and physician to assist with  identification of specific Stressors or current issues interfering with healing process. Setting desired goal for each stressor or current issue identified.;Long Term Goal: Stressors or current issues are controlled or eliminated.;Short Term goal: Identification and review with participant of any Quality of Life or Depression concerns found by scoring the questionnaire.;Long Term goal: The participant improves quality of Life and PHQ9 Scores as seen by post scores and/or verbalization of changes          Quality of Life Scores:  Scores of 19 and below usually indicate a poorer quality of life in these areas.  A difference of  2-3 points is a clinically meaningful difference.  A difference of 2-3 points in the total score of the Quality of Life Index has been associated with significant improvement in overall quality of life, self-image, physical symptoms, and general health in studies assessing change in quality of life.  PHQ-9: Review Flowsheet  More data exists      01/26/2024 12/15/2023 07/22/2023 12/10/2022 06/17/2021  Depression screen PHQ 2/9  Decreased Interest 0 0 0 1 1 0  Down, Depressed, Hopeless 0 0 0 1 1 0  PHQ - 2 Score 0 0 0 2 2 0  Altered sleeping 0 0 0 3 3 2   Tired, decreased energy 3 3 3 3 3 3   Change in appetite 1 2 2 1 2  0  Feeling bad or failure about yourself  0 0 0 -- 0 1  Trouble concentrating 0 1 0 0 0 1  Moving slowly or fidgety/restless 0 0 0 0 1 0  Suicidal thoughts 0 0 0 -- 0 0  PHQ-9 Score 4 6  5  9  11  7    Difficult doing work/chores Somewhat difficult - - Very difficult Very difficult -    Details       Data saved with a previous flowsheet row definition   Multiple values from one day are sorted in reverse-chronological order        Interpretation of Total Score  Total Score Depression Severity:  1-4 = Minimal depression, 5-9 = Mild depression, 10-14 = Moderate depression, 15-19 = Moderately severe depression, 20-27 = Severe depression    Psychosocial Evaluation and Intervention:  Psychosocial Evaluation - 12/10/23 1412       Psychosocial Evaluation & Interventions   Interventions Encouraged to exercise with the program and follow exercise prescription;Relaxation education;Stress management education    Comments Kamyiah states that she previously attended pulmonary rehab, however she had to discharge early due to difficulty with veritgo. She reports that these symptoms are more well controlled at this time, however she should avoid the treadmill and walking in circles. Kila states that she is seeing a spine specialist due to chronic back issues and that they are talking about physical therapy to improve pain and mobility, however this has not been set up at this  time. She denies any mental health concerns and states that she has a good support system, consisting of her son and ex-husband. Aeisha does want to take advantage of the dietician services to help balance her diet.    Expected Outcomes Short: Attend pulmonary rehab for education and exercise. Long: Develop and maintain positive self care habits.    Continue Psychosocial Services  Follow up required by staff          Psychosocial Re-Evaluation:  Psychosocial Re-Evaluation     Row Name 01/26/24 1303 02/23/24 1120           Psychosocial Re-Evaluation   Current issues with Current Stress Concerns Current Stress Concerns      Comments Parys is struggling with her health concerns and states she feels like she can't get her doctors to respond to her. Her new symptoms she is discussing with her doctors come and go, with no answers as to what might be causing it. She was encouraged to go to the doctor's office since she hasn't been able to contact them via phone or MyChart. The rheumatologist wants to run more tests, but her appointment isnt for another couple months. She mentions she has been dealing with poor health for a while and she feels like there is no hope when it comes  to her feeling better. Encouraged exercising and talk therapy. Amalie is still struggling with stress around her Dr not responding often and her difficulty finding causes for her symptoms.      Expected Outcomes Short: go to her doctor's office in person to ask questions Long: independently manage positive stress reduction techniques Short: go to her doctor's office in person to ask questions Long: independently manage positive stress reduction techniques      Interventions Encouraged to attend Pulmonary Rehabilitation for the exercise Encouraged to attend Pulmonary Rehabilitation for the exercise      Continue Psychosocial Services  Follow up required by staff Follow up required by staff         Psychosocial Discharge (Final Psychosocial Re-Evaluation):  Psychosocial Re-Evaluation - 02/23/24 1120       Psychosocial Re-Evaluation   Current issues with Current Stress Concerns    Comments Dickie is still struggling with stress around her Dr not responding often and her difficulty finding causes for her symptoms.    Expected Outcomes Short: go to her doctor's office in person to ask questions Long: independently manage positive stress reduction techniques    Interventions Encouraged to attend Pulmonary Rehabilitation for the exercise    Continue Psychosocial Services  Follow up required by staff          Education: Education Goals: Education classes will be provided on a weekly basis, covering required topics. Participant will state understanding/return demonstration of topics presented.  Learning Barriers/Preferences:  Learning Barriers/Preferences - 12/10/23 1407       Learning Barriers/Preferences   Learning Barriers None    Learning Preferences None          General Pulmonary Education Topics:  Infection Prevention: - Provides verbal and written material to individual with discussion of infection control including proper hand washing and proper equipment cleaning during exercise  session. Flowsheet Row Pulmonary Rehab from 12/15/2023 in [facility] Card[facility] Pulmonary Rehab  Date 12/15/23  Educator [facility]  Instruction Review Code 1- Verbalizes Understanding    Falls Prevention: - Provides verbal and written material to individual with discussion of falls prevention and safety. Flowsheet Row Pulmonary Rehab from 12/15/2023 in [facility] Cardiac  and Pulmonary Rehab  Date 12/15/23  Educator [facility]  Instruction Review Code 1- Verbalizes Understanding    Chronic Lung Disease Review: - Group verbal instruction with posters, models, PowerPoint presentations and videos,  to review new updates, new respiratory medications, new advancements in procedures and treatments. Providing information on websites and 800 numbers for continued self-education. Includes information about supplement oxygen, available portable oxygen systems, continuous and intermittent flow rates, oxygen safety, concentrators, and Medicare reimbursement for oxygen. Explanation of Pulmonary Drugs, including class, frequency, complications, importance of spacers, rinsing mouth after steroid MDI's, and proper cleaning methods for nebulizers. Review of basic lung anatomy and physiology related to function, structure, and complications of lung disease. Review of risk factors. Discussion about methods for diagnosing sleep apnea and types of masks and machines for OSA. Includes a review of the use of types of environmental controls: home humidity, furnaces, filters, dust mite/pet prevention, HEPA vacuums. Discussion about weather changes, air quality and the benefits of nasal washing. Instruction on Warning signs, infection symptoms, calling MD promptly, preventive modes, and value of vaccinations. Review of effective airway clearance, coughing and/or vibration techniques. Emphasizing that all should Create an Action Plan. Written material provided at class time.   AED/CPR: - Group verbal and written instruction with the use of models to  demonstrate the basic use of the AED with the basic ABC's of resuscitation.    Tests and Procedures:  - Group verbal and visual presentation and models provide information about basic cardiac anatomy and function. Reviews the testing methods done to diagnose heart disease and the outcomes of the test results. Describes the treatment choices: Medical Management, Angioplasty, or Coronary Bypass Surgery for treating various heart conditions including Myocardial Infarction, Angina, Valve Disease, and Cardiac Arrhythmias.  Written material provided at class time.   Medication Safety: - Group verbal and visual instruction to review commonly prescribed medications for heart and lung disease. Reviews the medication, class of the drug, and side effects. Includes the steps to properly store meds and maintain the prescription regimen.  Written material given at graduation.   Other: -Provides group and verbal instruction on various topics (see comments)   Knowledge Questionnaire Score:    Core Components/Risk Factors/Patient Goals at Admission:  Personal Goals and Risk Factors at Admission - 12/10/23 1406       Core Components/Risk Factors/Patient Goals on Admission    Weight Management Yes;Weight Loss    Intervention Weight Management: Develop a combined nutrition and exercise program designed to reach desired caloric intake, while maintaining appropriate intake of nutrient and fiber, sodium and fats, and appropriate energy expenditure required for the weight goal.;Weight Management: Provide education and appropriate resources to help participant work on and attain dietary goals.;Weight Management/Obesity: Establish reasonable short term and long term weight goals.;Obesity: Provide education and appropriate resources to help participant work on and attain dietary goals.    Goal Weight: Short Term 155 lb (70.3 kg)    Expected Outcomes Short Term: Continue to assess and modify interventions until short  term weight is achieved;Weight Loss: Understanding of general recommendations for a balanced deficit meal plan, which promotes 1-2 lb weight loss per week and includes a negative energy balance of [phone] kcal/d;Understanding recommendations for meals to include 15-35% energy as protein, 25-35% energy from fat, 35-60% energy from carbohydrates, less than 200mg  of dietary cholesterol, 20-35 gm of total fiber daily;Understanding of distribution of calorie intake throughout the day with the consumption of 4-5 meals/snacks;Long Term: Adherence to nutrition and physical activity/exercise program  aimed toward attainment of established weight goal    Improve shortness of breath with ADL's Yes    Intervention Provide education, individualized exercise plan and daily activity instruction to help decrease symptoms of SOB with activities of daily living.    Expected Outcomes Short Term: Improve cardiorespiratory fitness to achieve a reduction of symptoms when performing ADLs;Long Term: Be able to perform more ADLs without symptoms or delay the onset of symptoms    Hypertension Yes    Intervention Provide education on lifestyle modifcations including regular physical activity/exercise, weight management, moderate sodium restriction and increased consumption of fresh fruit, vegetables, and low fat dairy, alcohol moderation, and smoking cessation.;Monitor prescription use compliance.    Expected Outcomes Short Term: Continued assessment and intervention until BP is < 140/34mm HG in hypertensive participants. < 130/25mm HG in hypertensive participants with diabetes, heart failure or chronic kidney disease.;Long Term: Maintenance of blood pressure at goal levels.    Lipids Yes    Intervention Provide education and support for participant on nutrition & aerobic/resistive exercise along with prescribed medications to achieve LDL 70mg , HDL >40mg .    Expected Outcomes Long Term: Cholesterol controlled with medications as  prescribed, with individualized exercise RX and with personalized nutrition plan. Value goals: LDL < 70mg , HDL > 40 mg.;Short Term: Participant states understanding of desired cholesterol values and is compliant with medications prescribed. Participant is following exercise prescription and nutrition guidelines.          Education:Diabetes - Individual verbal and written instruction to review signs/symptoms of diabetes, desired ranges of glucose level fasting, after meals and with exercise. Acknowledge that pre and post exercise glucose checks will be done for 3 sessions at entry of program.   Know Your Numbers and Heart Failure: - Group verbal and visual instruction to discuss disease risk factors for cardiac and pulmonary disease and treatment options.  Reviews associated critical values for Overweight/Obesity, Hypertension, Cholesterol, and Diabetes.  Discusses basics of heart failure: signs/symptoms and treatments.  Introduces Heart Failure Zone chart for action plan for heart failure. Written material provided at class time.   Core Components/Risk Factors/Patient Goals Review:   Goals and Risk Factor Review     Row Name 01/26/24 1307 02/23/24 1125           Core Components/Risk Factors/Patient Goals Review   Personal Goals Review Weight Management/Obesity;Hypertension;Lipids;Improve shortness of breath with ADL's Weight Management/Obesity;Hypertension;Improve shortness of breath with ADL's      Review Aleeya states she has been sick the last few sessions so she is returning not feeling the strongest. She is unsure of the cause of her symptoms, but was pleased her oxygen sat and heart rate were good. Her blood pressure was stable in the 120s/60s. Her concern is her weight and she is thinking it could be fluid. She takes a daily fluid pill and was encouraged to weight herself daily to help manage fluid balance. She continues to take all her medication as prescribed, she is not currently on  medicaiton for lipid management but is using her diet and exercise as managment. Mikaela reports she doesnt check her BP much, but says it is often low. Needs to speak with Drs about this. Encouraged her to tell tehm soon about meds and low BP. She is still struggling with shortness of breath and finds exercise difficult but she wants to lose weight and become stronger. Encouraged her to eat more protein and attend rehab consistently with some home exercise.      Expected  Outcomes Short: weigh daily and track symptoms Long: independenlty manage risk factors STG: eat more protein and attend rehab consistently with some home exercise. LTG: Manage risk factors independently         Core Components/Risk Factors/Patient Goals at Discharge (Final Review):   Goals and Risk Factor Review - 02/23/24 1125       Core Components/Risk Factors/Patient Goals Review   Personal Goals Review Weight Management/Obesity;Hypertension;Improve shortness of breath with ADL's    Review Kathlynn reports she doesnt check her BP much, but says it is often low. Needs to speak with Drs about this. Encouraged her to tell tehm soon about meds and low BP. She is still struggling with shortness of breath and finds exercise difficult but she wants to lose weight and become stronger. Encouraged her to eat more protein and attend rehab consistently with some home exercise.    Expected Outcomes STG: eat more protein and attend rehab consistently with some home exercise. LTG: Manage risk factors independently          ITP Comments:  ITP Comments     Row Name 12/10/23 1350 12/15/23 1456 12/17/23 0919 12/30/23 1038 01/27/24 1151   ITP Comments Initial phone call completed. Diagnosis can be found in [facility] 11/24/2023. EP Orientation scheduled for Tuesday, 12/15/2023. Completed and gym orientation for pulmonary rehab. Initial ITP created and sent for review to Dr. Fuad Aleskerov, Medical Director. First full day of exercise!  Patient was  oriented to gym and equipment including functions, settings, policies, and procedures.  Patient's individual exercise prescription and treatment plan were reviewed.  All starting workloads were established based on the results of the 6 minute walk test done at initial orientation visit.  The plan for exercise progression was also introduced and progression will be customized based on patient's performance and goals. 30 Day review completed. Medical Director ITP review done, changes made as directed, and signed approval by Medical Director. 30 Day review completed. Medical Director ITP review done, changes made as directed, and signed approval by Medical Director.    Row Name 02/24/24 0849 03/23/24 1037         ITP Comments 30 Day review completed. Medical Director ITP review done, changes made as directed, and signed approval by Medical Director. 30 Day review completed. Medical Director ITP review done, changes made as directed, and signed approval by Medical Director.         Comments: 30 Day Review      [1]  Current Outpatient Medications:    ambrisentan  (LETAIRIS ) 10 MG tablet, Take 1 tablet (10 mg total) by mouth daily., Disp: 30 tablet, Rfl: 11   Azelaic Acid  15 % gel, Apply to face every morning, Disp: 50 g, Rfl: 5   benzonatate  (TESSALON ) 100 MG capsule, Take 1 capsule (100 mg total) by mouth 3 (three) times daily as needed for cough., Disp: 20 capsule, Rfl: 0   bismuth subsalicylate (PEPTO BISMOL) 262 MG/15ML suspension, Take 30 mLs by mouth every 6 (six) hours as needed for diarrhea or loose stools., Disp: , Rfl:    clobetasol  cream (TEMOVATE ) 0.05 %, Apply 1 Application topically as needed., Disp: 30 g, Rfl: 4   cromolyn  (OPTICROM ) 4 % ophthalmic solution, Place 1 drop into both eyes 4 (four) times daily., Disp: 10 mL, Rfl: 4   ELIDEL  1 % cream, Apply to red scaly areas on the face BID for seb derm, Disp: 30 g, Rfl: 4   fluticasone  (FLONASE ) 50 MCG/ACT nasal spray, Place  2 sprays  into both nostrils daily as needed for allergies or rhinitis., Disp: 16 g, Rfl: 5   furosemide  (LASIX ) 40 MG tablet, Take 1 tablet (40 mg total) by mouth daily., Disp: 90 tablet, Rfl: 1   hydrALAZINE  (APRESOLINE ) 25 MG tablet, TAKE 1 TABLET BY MOUTH TWICE A DAY, Disp: 180 tablet, Rfl: 3   Ivermectin  (SOOLANTRA ) 1 % CREA, Apply 1 Application topically at bedtime., Disp: 45 g, Rfl: 5   levothyroxine  (SYNTHROID ) 112 MCG tablet, Take 1 pill everyday except Sunday take half a pill, Disp: 90 tablet, Rfl: 3   meclizine  (ANTIVERT ) 25 MG tablet, Take 1 tablet (25 mg total) by mouth 3 (three) times daily as needed for dizziness., Disp: 30 tablet, Rfl: 0   meloxicam  (MOBIC ) 15 MG tablet, Take 1 tablet (15 mg total) by mouth daily., Disp: 21 tablet, Rfl: 0   omeprazole  (PRILOSEC) 40 MG capsule, TAKE 1 CAPSULE (40 MG TOTAL) BY MOUTH DAILY., Disp: 90 capsule, Rfl: 4   ondansetron  (ZOFRAN ) 4 MG tablet, Take 1 tablet (4 mg total) by mouth every 8 (eight) hours as needed for nausea or vomiting., Disp: 15 tablet, Rfl: 0   potassium chloride  SA (KLOR-CON  M) 20 MEQ tablet, Take 1 tablet (20 mEq total) by mouth daily., Disp: 90 tablet, Rfl: 1   Selexipag  (UPTRAVI ) 800 MCG TABS, Take 1 tablet (800 mcg total) by mouth in the morning and at bedtime., Disp: 60 tablet, Rfl: 11   tadalafil , PAH, (ADCIRCA ) 20 MG tablet, TAKE 2 TABLETS (40 MG TOTAL) BY MOUTH DAILY., Disp: 180 tablet, Rfl: 3   valACYclovir  (VALTREX ) 1000 MG tablet, Take 1,000 mg by mouth daily as needed (Cold Sores)., Disp: , Rfl:  [2]  Social History Tobacco Use  Smoking Status Never  Smokeless Tobacco Never

## 2024-03-24 ENCOUNTER — Encounter

## 2024-03-24 NOTE — Telephone Encounter (Signed)
 Pt notified via mychart. Kit has been created & placed at 2nd floor front desk for pick up.

## 2024-03-29 ENCOUNTER — Encounter: Attending: Cardiology

## 2024-03-29 DIAGNOSIS — I2721 Secondary pulmonary arterial hypertension: Secondary | ICD-10-CM | POA: Insufficient documentation

## 2024-03-29 NOTE — Progress Notes (Signed)
 Daily Session Note  Patient Details  Name: Genine Beckett MRN: [id number] Date of Birth: 12-Mar-1965 Referring Provider:   Flowsheet Row Pulmonary Rehab from 12/15/2023 in [facility] Cardiac and Pulmonary Rehab  Referring Provider Ezra Shuck    Encounter Date: 03/29/2024  Check In:  Session Check In - 03/29/24 1116       Check-In   Supervising physician immediately available to respond to emergencies See telemetry face sheet for immediately available ER MD    Location [facility]-Cardiac & Pulmonary Rehab    Staff Present Hoy Rodney RN,BSN;Fidelis Loth RN,BSN,MPA;Maxon Conetta BS, Exercise Physiologist;Jason Elnor RDN,LDN    Virtual Visit No    Medication changes reported     No    Fall or balance concerns reported    No    Tobacco Cessation No Change    Warm-up and Cool-down Performed on first and last piece of equipment    Resistance Training Performed Yes    VAD Patient? No    PAD/SET Patient? No      Pain Assessment   Currently in Pain? No/denies             Tobacco Use History[1]  Goals Met:  Independence with exercise equipment Exercise tolerated well No report of concerns or symptoms today Strength training completed today  Goals Unmet:  Not Applicable  Comments: Pt able to follow exercise prescription today without complaint.  Will continue to monitor for progression.    Dr. Oneil Pinal is Medical Director for [facility] Cardiac Rehabilitation.  Dr. Fuad Aleskerov is Medical Director for [facility] Pulmonary Rehabilitation.    [1]  Social History Tobacco Use  Smoking Status Never  Smokeless Tobacco Never

## 2024-03-31 ENCOUNTER — Other Ambulatory Visit (HOSPITAL_COMMUNITY): Payer: Self-pay | Admitting: Cardiology

## 2024-03-31 ENCOUNTER — Encounter: Admitting: Emergency Medicine

## 2024-03-31 ENCOUNTER — Encounter: Payer: Self-pay | Admitting: Nurse Practitioner

## 2024-03-31 ENCOUNTER — Other Ambulatory Visit: Payer: Self-pay

## 2024-03-31 DIAGNOSIS — I2721 Secondary pulmonary arterial hypertension: Secondary | ICD-10-CM

## 2024-03-31 MED ORDER — AMBRISENTAN 10 MG PO TABS
10.0000 mg | ORAL_TABLET | Freq: Every day | ORAL | 11 refills | Status: AC
  Filled 2024-03-31 – 2024-04-01 (×2): qty 30, 30d supply, fill #0

## 2024-03-31 NOTE — Progress Notes (Signed)
 Daily Session Note  Patient Details  Name: Tonya Odonnell MRN: [id number] Date of Birth: [birth year] Referring Provider:   Flowsheet Row Pulmonary Rehab from 12/15/2023 in [facility] Cardiac and Pulmonary Rehab  Referring Provider Ezra Shuck    Encounter Date: 03/31/2024  Check In:  Session Check In - 03/31/24 1146       Check-In   Supervising physician immediately available to respond to emergencies See telemetry face sheet for immediately available ER MD    Location [facility]-Cardiac & Pulmonary Rehab    Staff Present Leita Franks RN,BSN;Joseph [facility] [place], [place], Exercise Physiologist;Jason Elnor RDN,LDN    Virtual Visit No    Medication changes reported     No    Fall or balance concerns reported    No    Tobacco Cessation No Change    Warm-up and Cool-down Performed on first and last piece of equipment    Resistance Training Performed Yes    VAD Patient? No    PAD/SET Patient? No      Pain Assessment   Currently in Pain? No/denies             Tobacco Use History[1]  Goals Met:  Proper associated with RPD/PD & O2 Sat Independence with exercise equipment Using PLB without cueing & demonstrates good technique Exercise tolerated well No report of concerns or symptoms today Strength training completed today  Goals Unmet:  Not Applicable  Comments: Pt able to follow exercise prescription today without complaint.  Will continue to monitor for progression.    Dr. Oneil Pinal is Medical Director for [facility] Cardiac Rehabilitation.  Dr. Fuad Aleskerov is Medical Director for [facility] Pulmonary Rehabilitation.    [1]  Social History Tobacco Use  Smoking Status Never  Smokeless Tobacco Never

## 2024-04-01 ENCOUNTER — Other Ambulatory Visit: Payer: Self-pay

## 2024-04-01 ENCOUNTER — Other Ambulatory Visit (HOSPITAL_COMMUNITY): Payer: Self-pay

## 2024-04-01 NOTE — Progress Notes (Signed)
 Specialty Pharmacy Refill Coordination Note  Tonya Odonnell is a 59 y.o. female contacted today regarding refills of specialty medication(s) Ambrisentan  (LETAIRIS )   Patient requested (Patient-Rptd) Delivery   Delivery date: 04/07/24   Verified address: (Patient-Rptd) [address],[place],[address]   Medication will be filled on: 04/06/24

## 2024-04-01 NOTE — Telephone Encounter (Signed)
 PROVIDER REFERRAL FORM: [facility] REQUEST FOR PCS ASSESSMENT

## 2024-04-05 ENCOUNTER — Encounter

## 2024-04-06 ENCOUNTER — Other Ambulatory Visit: Payer: Self-pay

## 2024-04-06 ENCOUNTER — Other Ambulatory Visit (HOSPITAL_COMMUNITY): Payer: Self-pay

## 2024-04-07 ENCOUNTER — Encounter

## 2024-04-12 ENCOUNTER — Encounter

## 2024-04-14 ENCOUNTER — Encounter

## 2024-04-15 ENCOUNTER — Other Ambulatory Visit: Payer: Self-pay

## 2024-04-19 ENCOUNTER — Encounter

## 2024-04-21 ENCOUNTER — Encounter

## 2024-04-22 ENCOUNTER — Ambulatory Visit: Admitting: Internal Medicine

## 2024-04-26 ENCOUNTER — Encounter

## 2024-04-28 ENCOUNTER — Encounter

## 2024-05-03 ENCOUNTER — Encounter

## 2024-05-05 ENCOUNTER — Encounter

## 2024-05-10 ENCOUNTER — Encounter

## 2024-05-12 ENCOUNTER — Encounter

## 2024-05-13 ENCOUNTER — Other Ambulatory Visit

## 2024-05-17 ENCOUNTER — Encounter

## 2024-05-17 ENCOUNTER — Ambulatory Visit: Admitting: "Endocrinology

## 2024-05-19 ENCOUNTER — Encounter

## 2024-05-24 ENCOUNTER — Encounter

## 2024-05-26 ENCOUNTER — Encounter

## 2024-05-31 ENCOUNTER — Encounter

## 2024-06-02 ENCOUNTER — Encounter

## 2024-06-07 ENCOUNTER — Encounter

## 2024-06-09 ENCOUNTER — Encounter

## 2024-06-13 ENCOUNTER — Ambulatory Visit (HOSPITAL_COMMUNITY): Admitting: Cardiology

## 2024-06-14 ENCOUNTER — Encounter

## 2024-06-16 ENCOUNTER — Encounter

## 2024-06-21 ENCOUNTER — Encounter

## 2024-06-23 ENCOUNTER — Encounter
